# Patient Record
Sex: Female | Born: 1955 | Race: Black or African American | Hispanic: No | Marital: Married | Smoking: Former smoker
Health system: Southern US, Community
[De-identification: ages and names within clinical notes are randomized; demographics above are authoritative.]

## PROBLEM LIST (undated history)

## (undated) DIAGNOSIS — G4733 Obstructive sleep apnea (adult) (pediatric): Secondary | ICD-10-CM

## (undated) DIAGNOSIS — M545 Low back pain, unspecified: Secondary | ICD-10-CM

## (undated) DIAGNOSIS — E119 Type 2 diabetes mellitus without complications: Secondary | ICD-10-CM

## (undated) DIAGNOSIS — J869 Pyothorax without fistula: Secondary | ICD-10-CM

## (undated) DIAGNOSIS — H8110 Benign paroxysmal vertigo, unspecified ear: Secondary | ICD-10-CM

## (undated) DIAGNOSIS — Z8709 Personal history of other diseases of the respiratory system: Secondary | ICD-10-CM

## (undated) DIAGNOSIS — N6009 Solitary cyst of unspecified breast: Secondary | ICD-10-CM

## (undated) DIAGNOSIS — Z8701 Personal history of pneumonia (recurrent): Secondary | ICD-10-CM

## (undated) DIAGNOSIS — M170 Bilateral primary osteoarthritis of knee: Secondary | ICD-10-CM

## (undated) DIAGNOSIS — J189 Pneumonia, unspecified organism: Secondary | ICD-10-CM

## (undated) DIAGNOSIS — N200 Calculus of kidney: Secondary | ICD-10-CM

## (undated) DIAGNOSIS — M19011 Primary osteoarthritis, right shoulder: Secondary | ICD-10-CM

## (undated) DIAGNOSIS — J4530 Mild persistent asthma, uncomplicated: Secondary | ICD-10-CM

## (undated) HISTORY — DX: Pyothorax without fistula: J86.9

## (undated) HISTORY — DX: Mild persistent asthma, uncomplicated: J45.30

## (undated) HISTORY — DX: Personal history of pneumonia (recurrent): Z87.01

## (undated) HISTORY — DX: Low back pain, unspecified: M54.50

## (undated) HISTORY — PX: TONSILLECTOMY AND ADENOIDECTOMY: SHX28

## (undated) HISTORY — DX: Morbid (severe) obesity due to excess calories: E66.01

## (undated) HISTORY — DX: Obstructive sleep apnea (adult) (pediatric): G47.33

## (undated) HISTORY — DX: Pneumonia, unspecified organism: J18.9

## (undated) HISTORY — PX: TRANSTHORACIC ECHOCARDIOGRAM: SHX275

## (undated) HISTORY — DX: Solitary cyst of unspecified breast: N60.09

## (undated) HISTORY — DX: Benign paroxysmal vertigo, unspecified ear: H81.10

## (undated) HISTORY — DX: Personal history of other diseases of the respiratory system: Z87.09

## (undated) HISTORY — DX: Low back pain: M54.5

## (undated) HISTORY — DX: Primary osteoarthritis, right shoulder: M19.011

## (undated) HISTORY — DX: Bilateral primary osteoarthritis of knee: M17.0

## (undated) HISTORY — DX: Calculus of kidney: N20.0

---

## 1976-12-26 HISTORY — PX: APPENDECTOMY: SHX54

## 1984-12-26 HISTORY — PX: CHOLECYSTECTOMY: SHX55

## 1991-12-27 HISTORY — PX: LUMBAR DISC SURGERY: SHX700

## 1992-12-26 HISTORY — PX: TOTAL ABDOMINAL HYSTERECTOMY: SHX209

## 2006-12-26 HISTORY — PX: COLONOSCOPY: SHX174

## 2008-12-26 HISTORY — PX: CARDIOVASCULAR STRESS TEST: SHX262

## 2008-12-26 HISTORY — PX: CARDIAC CATHETERIZATION: SHX172

## 2009-12-26 HISTORY — PX: COLONOSCOPY: SHX174

## 2012-03-28 ENCOUNTER — Encounter: Payer: Self-pay | Admitting: Family Medicine

## 2012-03-28 ENCOUNTER — Ambulatory Visit (HOSPITAL_BASED_OUTPATIENT_CLINIC_OR_DEPARTMENT_OTHER)
Admission: RE | Admit: 2012-03-28 | Discharge: 2012-03-28 | Disposition: A | Discharge status: Home / Self Care | Payer: 59 | Source: Ambulatory Visit | Attending: Family Medicine | Admitting: Family Medicine

## 2012-03-28 ENCOUNTER — Ambulatory Visit (INDEPENDENT_AMBULATORY_CARE_PROVIDER_SITE_OTHER): Payer: 59 | Admitting: Family Medicine

## 2012-03-28 ENCOUNTER — Encounter: Payer: Self-pay | Admitting: *Deleted

## 2012-03-28 VITALS — BP 127/78 | HR 86 | Temp 99.8°F | Wt 355.0 lb

## 2012-03-28 DIAGNOSIS — R05 Cough: Secondary | ICD-10-CM

## 2012-03-28 DIAGNOSIS — R918 Other nonspecific abnormal finding of lung field: Secondary | ICD-10-CM

## 2012-03-28 DIAGNOSIS — Z87891 Personal history of nicotine dependence: Secondary | ICD-10-CM

## 2012-03-28 DIAGNOSIS — J45909 Unspecified asthma, uncomplicated: Secondary | ICD-10-CM

## 2012-03-28 DIAGNOSIS — R509 Fever, unspecified: Secondary | ICD-10-CM | POA: Insufficient documentation

## 2012-03-28 DIAGNOSIS — R059 Cough, unspecified: Secondary | ICD-10-CM | POA: Insufficient documentation

## 2012-03-28 DIAGNOSIS — R062 Wheezing: Secondary | ICD-10-CM

## 2012-03-28 MED ORDER — ALBUTEROL SULFATE HFA 108 (90 BASE) MCG/ACT IN AERS: 2.0000 | INHALATION_SPRAY | RESPIRATORY_TRACT | Status: DC | PRN

## 2012-03-28 MED ORDER — PREDNISONE 20 MG PO TABS: ORAL_TABLET | ORAL | Status: DC

## 2012-03-28 MED ORDER — ALBUTEROL SULFATE (2.5 MG/3ML) 0.083% IN NEBU: 2.5000 mg | INHALATION_SOLUTION | RESPIRATORY_TRACT | Status: DC

## 2012-03-28 MED ORDER — ALBUTEROL SULFATE (2.5 MG/3ML) 0.083% IN NEBU
2.5000 mg | INHALATION_SOLUTION | RESPIRATORY_TRACT | Status: AC
  Administered 2012-03-28 (×2): 2.5 mg via RESPIRATORY_TRACT

## 2012-03-28 NOTE — Progress Notes (Signed)
Office Note 03/28/2012  CC:  Chief Complaint  Patient presents with  . Establish Care    Will be a pt of [name], URI today; previously seen by [facility] and given levaquin and hycodan    HPI:  Taylor Leon is a 56 y.o. Black female who is here to get established with [facility] and is sick.  She will actually be a patient of [name] at our [facility] office but she is currently on vacation. Patient's most recent primary MD: Kirt Boys at [facility]. Old records not reviewed prior to or during today's visit.  She presents today with a two week history of cough, minimal nasal congestion or runny nose, onset was mild/insidious.  Some improvement midway through illness then got much worse: SOB, wheezing, tight in chest, fevers to 102. Cough productive of green sputum, occasionally blood tinged in the morning.   Saw MD last week and was rx'd levaquin, hycodan, and advair diskus.  She finished the levaquin but did not get the diskus due to high cost (80+$).   She is here b/c her breathing/chest sx's continue to get worse--can hardly talk due to frequent coughing and feeling SOB.  ROS: no HA, no rash, no ST.  Nasal allergies have not been bothering her.  Past Medical History  Diagnosis Date  . H/O allergic rhinitis   . Obesity   . History of pneumonia 2009 and 2010  . Osteoarthritis of both knees     No improvement with steroid injections; did synvisc x 3 yrs; bilat replacement was recommended but she declined.    Past Surgical History  Procedure Date  . Cholecystectomy 1986  . Appendectomy 1978  . Tonsillectomy and adenoidectomy 1967ish  . Lumbar disc surgery 1993    L5/S1  . Cesarean section     x 3  . Total abdominal hysterectomy 1994    Ovaries are still in.  This was done for DUB/fibroids.    Family History  Problem Relation Age of Onset  . Arthritis Mother   . Heart disease Mother 81  . Hypertension Mother   . Arthritis Paternal Grandmother       History   Social History  . Marital Status: Married    Spouse Name: N/A    Number of Children: N/A  . Years of Education: N/A   Occupational History  . Not on file.   Social History Main Topics  . Smoking status: Former Smoker    Types: Cigarettes    Quit date: 12/28/2009  . Smokeless tobacco: Never Used  . Alcohol Use: Yes     social  . Drug Use: No  . Sexually Active: Not on file   Other Topics Concern  . Not on file   Social History Narrative   Married, 4 grown children.Relocated to [place], [place] from [place] about 2012.Works as Tour manager.Former smoker: 15 pack-yr hx, quit 2011.Exercise: water aerobics 3-7 days per week.    MEDS: Hycodan susp, 1 tsp q6h prn, vitamin D, echinacea, probiotic, fish oil  Allergies  Allergen Reactions  . Penicillins Nausea Only and Rash    ROS Review of Systems  Constitutional: Positive for fever and fatigue. Negative for chills and appetite change.  HENT: Negative for ear pain, neck stiffness and dental problem.        See HPI  Eyes: Negative for discharge, redness and visual disturbance.  Respiratory:       See HPI  Cardiovascular: Negative for chest pain,  palpitations and leg swelling.  Gastrointestinal: Negative for nausea, vomiting, abdominal pain, diarrhea and blood in stool.  Genitourinary: Positive for vaginal pain. Negative for dysuria, urgency, frequency, hematuria, flank pain and difficulty urinating.  Musculoskeletal: Negative for myalgias, back pain, joint swelling and arthralgias.  Skin: Negative for pallor and rash.  Neurological: Negative for dizziness, speech difficulty, weakness and headaches.  Hematological: Negative for adenopathy. Does not bruise/bleed easily.  Psychiatric/Behavioral: Negative for confusion and sleep disturbance. The patient is not nervous/anxious.     PE; Blood pressure 127/78, pulse 86, temperature 99.8 F (37.7 C), temperature source Temporal, weight 355  lb (161.027 kg), SpO2 92.00%. Gen: Alert, tired appearing and in mild distress initially.  Patient is oriented to person, place, time, and situation.   ENT: Ears: EACs clear, normal epithelium.  TMs with good light reflex and landmarks bilaterally.  Eyes: no injection, icteris, swelling, or exudate.  EOMI, PERRLA. Nose: no drainage or turbinate edema/swelling.  No injection or focal lesion.  Mouth: lips without lesion/swelling.  Oral mucosa pink and moist.  Dentition intact and without obvious caries or gingival swelling.  Oropharynx without erythema, exudate, or swelling.  Neck - No masses or thyromegaly or limitation in range of motion CV: RRR, no m/r/g.   LUNGS: Initial exam showed globally decreased BS, mild prolongation of exp phase, excessive post exhalation coughing.  After albut #1 she had improved aeration and a hint of coarse end exp wheezing diffusely, with decreased BS and subtle soft insp crackles in both bases.  Resps were much slower and less labored.  Coughing lessened.  After neb #2 she had similar lung sounds, nonlabored breathing.  No tachypnea. ABD: soft, NT EXT: no clubbing, cyanosis, or edema.   Pertinent labs:  none  ASSESSMENT AND PLAN:   Asthmatic bronchitis Improved significantly with back to back albuterol 2.5mg  nebs here in office today. Plan to check CXR due to question of subtle bibasilar decreased breath sounds and soft inspiratory crackles. Start prednisone taper: 60mg  qd x 5d, then 40mg  qd x5d, then 20mg  qd x 5d. Ventolin HFA 2 puffs q4h prn.  Therapeutic expectations and side effect profile of medication discussed today.  Patient's questions answered. May continue prn hycodan that was previously prescribed. F/u in office in 2d.     Return in about 2 days (around 03/30/2012) for f/u asthmatic bronchitis.

## 2012-03-28 NOTE — Assessment & Plan Note (Signed)
Improved significantly with back to back albuterol 2.5mg  nebs here in office today. Plan to check CXR due to question of subtle bibasilar decreased breath sounds and soft inspiratory crackles. Start prednisone taper: 60mg  qd x 5d, then 40mg  qd x5d, then 20mg  qd x 5d. Ventolin HFA 2 puffs q4h prn.  Therapeutic expectations and side effect profile of medication discussed today.  Patient's questions answered. May continue prn hycodan that was previously prescribed. F/u in office in 2d.

## 2012-03-30 ENCOUNTER — Encounter: Payer: Self-pay | Admitting: Family Medicine

## 2012-03-30 ENCOUNTER — Ambulatory Visit (INDEPENDENT_AMBULATORY_CARE_PROVIDER_SITE_OTHER): Payer: 59 | Admitting: Family Medicine

## 2012-03-30 VITALS — BP 125/76 | HR 66 | Temp 98.2°F | Wt 355.0 lb

## 2012-03-30 DIAGNOSIS — J45909 Unspecified asthma, uncomplicated: Secondary | ICD-10-CM

## 2012-03-30 MED ORDER — HYDROCODONE-HOMATROPINE 5-1.5 MG/5ML PO SYRP: ORAL_SOLUTION | ORAL | Status: DC

## 2012-03-30 NOTE — Progress Notes (Signed)
OFFICE NOTE  04/01/2012  CC:  Chief Complaint  Patient presents with  . Bronchitis    2 day follow up     HPI: Patient is a 56 y.o. African-American female who is here for 2 d f/u acute asthmatic bronchitis.  Her CXR showed mild cardiomegaly and bronchitic changes but was otherwise normal. Definitely feels better, still coughing but breathing much improved.  No fever since yesterday morning, sputum is thinner, less of it.  Taking ventolin.  Prednisone made her have insomnia and some mild cognitive oddities/racing thoughts--she is on day 3 of a 15 day taper of 60 qd x 5d, 40qd x 5d, then 20 qd x 5d. She is apprehensive about this and the potential water retention/wt gain risk with prednisone, but admits she is willing to go through with the med if it means getting her lungs better.   Pertinent PMH:  Past Medical History  Diagnosis Date  . H/O allergic rhinitis   . Obesity   . History of pneumonia 2009 and 2010  . Osteoarthritis of both knees     No improvement with steroid injections; did synvisc x 3 yrs; bilat replacement was recommended but she declined.    MEDS:  Outpatient Prescriptions Prior to Visit  Medication Sig Dispense Refill  . albuterol (VENTOLIN HFA) 108 (90 BASE) MCG/ACT inhaler Inhale 2 puffs into the lungs every 4 (four) hours as needed for wheezing.  1 Inhaler  0  . cholecalciferol (VITAMIN D) 1000 UNITS tablet Take 1,000 Units by mouth daily.      Marland Kitchen ECHINACEA PO Take by mouth.      . Omega-3 Fatty Acids (FISH OIL PO) Take 1 capsule by mouth 3 (three) times a week.      . predniSONE (DELTASONE) 20 MG tablet 3 tabs po qd x 5d, then 2 tabs po qd x 5d, then 1 tab po qd x 5d  30 tablet  0  . Probiotic Product (PROBIOTIC FORMULA PO) Take by mouth.      Marland Kitchen HYDROcodone-homatropine (HYCODAN) 5-1.5 MG/5ML syrup Take 5 mLs by mouth every 4 (four) hours as needed.      Phentermine qd via bariatric clinic, HCG and vit B12 injections via bariatric clinic  PE: Blood pressure  125/76, pulse 66, temperature 98.2 F (36.8 C), temperature source Temporal, weight 355 lb (161.027 kg), SpO2 92.00%. Gen: Alert, well appearing.  Patient is oriented to person, place, time, and situation. ENT: Ears: EACs clear, normal epithelium.  TMs with good light reflex and landmarks bilaterally.  Eyes: no injection, icteris, swelling, or exudate.  EOMI, PERRLA. Nose: no drainage or turbinate edema/swelling.  No injection or focal lesion.  Mouth: lips without lesion/swelling.  Oral mucosa pink and moist.  Dentition intact and without obvious caries or gingival swelling.  Oropharynx without erythema, exudate, or swelling.  Neck - No masses or thyromegaly or limitation in range of motion CV: RRR, no m/r/g LUNGS: CTA bilat, mild diminished BS in expiration and mildly prolonged exp phase.  Nonlabored resps. EXT: no clubbing, cyanosis, or edema.    IMPRESSION AND PLAN:  Asthmatic bronchitis Improved appropriately over the last 48h. I would like to see her one more time for f/u of this, however, in 1 wk. Continue current meds, RF of hycodan (#173ml) given today. She'll call if she can't stand the way the prednisone is making her feel anymore and we'll see what we can do about adjusting dose/taper schedule.        FOLLOW UP: 1  wk

## 2012-04-01 ENCOUNTER — Encounter: Payer: Self-pay | Admitting: Family Medicine

## 2012-04-01 NOTE — Assessment & Plan Note (Addendum)
Improved appropriately over the last 48h. I would like to see her one more time for f/u of this, however, in 1 wk. Continue current meds, RF of hycodan (#113ml) given today. She'll call if she can't stand the way the prednisone is making her feel anymore and we'll see what we can do about adjusting dose/taper schedule.

## 2012-04-05 ENCOUNTER — Telehealth: Payer: Self-pay | Admitting: *Deleted

## 2012-04-05 NOTE — Telephone Encounter (Signed)
Pt has only taken on tablet today.  She states she feels bloated/puffy and loopy.  She describes loopy as her head is not together.  She states she is feeling better. Please advise.

## 2012-04-05 NOTE — Telephone Encounter (Signed)
VM left by pt wanting to know if she can wean to only one tablet of prednisone today.  She has taken 3 tab x 5 days, 2 tabs x3 days and would like to start one tablet today.  PC to pt for more information.  Message left with husband to have patient return my call.

## 2012-04-05 NOTE — Telephone Encounter (Signed)
Yes, may now go to one tab once daily.

## 2012-04-06 ENCOUNTER — Encounter: Payer: Self-pay | Admitting: Family Medicine

## 2012-04-06 ENCOUNTER — Ambulatory Visit (INDEPENDENT_AMBULATORY_CARE_PROVIDER_SITE_OTHER): Payer: 59 | Admitting: Family Medicine

## 2012-04-06 DIAGNOSIS — J218 Acute bronchiolitis due to other specified organisms: Secondary | ICD-10-CM

## 2012-04-06 DIAGNOSIS — J45909 Unspecified asthma, uncomplicated: Secondary | ICD-10-CM

## 2012-04-06 NOTE — Patient Instructions (Signed)
Take 1 20mg  prednisone tab once daily Sat and Sun, then take 1/2 of 30m tab once daily on M, T, W.  Then stop prednisone. Use Ventolin 2 puffs 15 min prior to vigorous activity (for the next 1-2 wks).

## 2012-04-06 NOTE — Telephone Encounter (Signed)
Pt notified on 4/11 OK to decrease to just one tablet daily.  She will come for office visit today for follow up and further instructions on weaning.

## 2012-04-06 NOTE — Progress Notes (Signed)
OFFICE NOTE  04/06/2012  CC:  Chief Complaint  Patient presents with  . Follow-up    Asthmatic bronchitis     HPI: Patient is a 56 y.o. African-American female who is here for 5 day f/u acute bronchitis.  Feeling much improved.  Rare use of albuterol inhaler or hycodan last couple of days.  Prednisone makes her irritable, some sleep dysfunction, plus she's worried about fluid retention/wt gain on this med.    Pertinent PMH:  Past Medical History  Diagnosis Date  . H/O allergic rhinitis   . Obesity     Currently goes to Bariatric clinic on [address] and gets weekly HCG injections, monthly vit B12 injections, and takes phentermine daily.  Marland Kitchen History of pneumonia 2009 and 2010  . Osteoarthritis of both knees     No improvement with steroid injections; did synvisc x 3 yrs; bilat replacement was recommended but she declined.    MEDS:  Outpatient Prescriptions Prior to Visit  Medication Sig Dispense Refill  . albuterol (VENTOLIN HFA) 108 (90 BASE) MCG/ACT inhaler Inhale 2 puffs into the lungs every 4 (four) hours as needed for wheezing.  1 Inhaler  0  . cholecalciferol (VITAMIN D) 1000 UNITS tablet Take 1,000 Units by mouth daily.      Marland Kitchen ECHINACEA PO Take by mouth.      Marland Kitchen HYDROcodone-homatropine (HYCODAN) 5-1.5 MG/5ML syrup 1-2 tsp po q6h prn cough  120 mL  0  . Omega-3 Fatty Acids (FISH OIL PO) Take 1 capsule by mouth 3 (three) times a week.      . predniSONE (DELTASONE) 20 MG tablet 3 tabs po qd x 5d, then 2 tabs po qd x 5d, then 1 tab po qd x 5d  30 tablet  0  . Probiotic Product (PROBIOTIC FORMULA PO) Take by mouth.        PE: Blood pressure 118/71, pulse 59, temperature 97.8 F (36.6 C), temperature source Temporal, weight 349 lb (158.305 kg), SpO2 95.00%. Gen: Alert, well appearing.  Patient is oriented to person, place, time, and situation. CV: RRR, no m/r/g.   LUNGS: CTA bilat, nonlabored resps, good aeration in all lung fields.  Some mild post-exhalation coughing  intermittently on forced expiration.   IMPRESSION AND PLAN:  Asthmatic bronchitis Continues to improve. Due to mild sensitivity/intolerance to psych effects of prednisone will decrease her taper over the next 5d to 20mg  qd x 2d and then 10mg  qd x 3d, then stop. May restart exercise, swim aerobics--consider pretreatment with albuterol HFA.      FOLLOW UP: prn

## 2012-04-09 NOTE — Assessment & Plan Note (Signed)
Continues to improve. Due to mild sensitivity/intolerance to psych effects of prednisone will decrease her taper over the next 5d to 20mg  qd x 2d and then 10mg  qd x 3d, then stop. May restart exercise, swim aerobics--consider pretreatment with albuterol HFA.

## 2012-04-11 ENCOUNTER — Ambulatory Visit (INDEPENDENT_AMBULATORY_CARE_PROVIDER_SITE_OTHER): Payer: 59 | Admitting: Family Medicine

## 2012-04-11 ENCOUNTER — Encounter: Payer: Self-pay | Admitting: Family Medicine

## 2012-04-11 VITALS — BP 125/81 | HR 68 | Temp 98.0°F | Ht 68.0 in | Wt 348.6 lb

## 2012-04-11 DIAGNOSIS — J302 Other seasonal allergic rhinitis: Secondary | ICD-10-CM

## 2012-04-11 DIAGNOSIS — J45909 Unspecified asthma, uncomplicated: Secondary | ICD-10-CM

## 2012-04-11 DIAGNOSIS — J309 Allergic rhinitis, unspecified: Secondary | ICD-10-CM

## 2012-04-11 MED ORDER — PREDNISONE 20 MG PO TABS: ORAL_TABLET | ORAL | Status: DC

## 2012-04-11 MED ORDER — METHYLPREDNISOLONE ACETATE 40 MG/ML IJ SUSP
40.0000 mg | Freq: Once | INTRAMUSCULAR | Status: AC
  Administered 2012-04-12: 40 mg via INTRAMUSCULAR

## 2012-04-11 MED ORDER — FLUTICASONE PROPIONATE 50 MCG/ACT NA SUSP: 2.0000 | Freq: Every day | NASAL | Status: DC

## 2012-04-11 MED ORDER — ALBUTEROL SULFATE (5 MG/ML) 0.5% IN NEBU: 2.5000 mg | INHALATION_SOLUTION | Freq: Once | RESPIRATORY_TRACT | Status: DC

## 2012-04-11 MED ORDER — AZITHROMYCIN 250 MG PO TABS: ORAL_TABLET | ORAL | Status: DC

## 2012-04-11 MED ORDER — IPRATROPIUM BROMIDE 0.02 % IN SOLN: 0.5000 mg | Freq: Once | RESPIRATORY_TRACT | Status: DC

## 2012-04-11 MED ORDER — ALBUTEROL SULFATE (2.5 MG/3ML) 0.083% IN NEBU
2.5000 mg | INHALATION_SOLUTION | RESPIRATORY_TRACT | Status: AC
  Administered 2012-04-11: 2.5 mg via RESPIRATORY_TRACT

## 2012-04-11 MED ORDER — FEXOFENADINE HCL 180 MG PO TABS: 180.0000 mg | ORAL_TABLET | Freq: Every day | ORAL | Status: DC

## 2012-04-11 NOTE — Assessment & Plan Note (Addendum)
She did not respond well to our recent "quick ween" from the prednisone. I think we need to control her allergic rhinitis better (start allegra 180mg  qd and flonase 2 sprays each nostril qd), plus will add another round of antibiotic (Z-pack).   Additionally, I sent her home with a nebulizer machine and samples of albuterol 0.083% neb solution to use instead of her Ventolin HFA for now. Gave depo-medrol 40mg  IM in office today, and will restart her prednisone 40mg  qd dosing tomorrow--do this 5d, then step down to 20mg  qd x 5d.  At that point, I want to see her back in the office. Gave one 2.5mg  albuterol neb in office today, followed that with one duoneb.  Her aeration had improved and post-exhalation coughing lessened. We decided to have an allergist see her for further evaluation and I placed this order today. We discussed sx's/situations to go to the ED for.

## 2012-04-11 NOTE — Progress Notes (Signed)
OFFICE NOTE  04/11/2012  CC:  Chief Complaint  Patient presents with  . Cough    X 3 weeks W/ phlegm (greenish)  . Shortness of Breath    X 3 weeks     HPI: Patient is a 56 y.o. African-American female who is here for the fourth visit in the last 2 wks for waxing/waning cough and chest tightness with intermittent SOB.  No fevers/chills/rigors.  Feels PND and sensation of something in the back of her throat frequently.  Denies heartburn or relation of her sx's to meals.        Cough comes in fits, usually late mornings after talking on the phone a lot, then after a coughing fit the feeling of chest tightness persists and she has to use ventolin q4h, esp if she gets up and tries to walk around.  No exertional chest pain, no nausea, no palpitations.  She has some sweating with coughing fits but this abates in a few minutes.   Last visit about 5d ago we stepped down her prednisone a little early to a 20 mg daily dose for 3d, then 10mg  daily dose for the last 3d.  She reports feeling fine in the morning, goes to do water aerobics (pretreats with ventolin) and feels good. Recently had some coughing very productive of green sputum.     Pertinent PMH:  Past Medical History  Diagnosis Date  . H/O allergic rhinitis   . Obesity     Currently goes to Bariatric clinic on [address] and gets weekly HCG injections, monthly vit B12 injections, and takes phentermine daily.  Marland Kitchen History of pneumonia 2009 and 2010  . Osteoarthritis of both knees     No improvement with steroid injections; did synvisc x 3 yrs; bilat replacement was recommended but she declined.  No past history of asthmatic bronchitis.  MEDS:  MEDS: Ventolin HFA 1-2 puffs q4h prn, recent prednisone taper (current dose 10mg  qd), vit D, echinacea  PE: Blood pressure 125/81, pulse 68, temperature 98 F (36.7 C), temperature source Temporal, height 5\' 8"  (1.727 m), weight 348 lb 9.6 oz (158.124 kg), SpO2 92.00%. Gen: Alert, well  appearing.  Patient is oriented to person, place, time, and situation. ENT: Ears: EACs clear, normal epithelium.  TMs with good light reflex and landmarks bilaterally.  Eyes: no injection, icteris, swelling, or exudate.  EOMI, PERRLA. Nose: no drainage or turbinate edema/swelling.  No injection or focal lesion.  Mouth: lips without lesion/swelling.  Oral mucosa pink and moist.  Dentition intact and without obvious caries or gingival swelling.  Oropharynx without erythema, exudate, or swelling.  Neck - No masses or thyromegaly or limitation in range of motion CV: RRR, no m/r/g LUNGS: CTA bilat but poor aeration and when asked to do deep breaths there are some coarse exp rhonchi with excessive post-exhalation coughing.   EXT: no clubbing, cyanosis, or edema.   LAB: none  IMPRESSION AND PLAN:  Asthmatic bronchitis She did not respond well to our recent "quick ween" from the prednisone. I think we need to control her allergic rhinitis better (start allegra 180mg  qd and flonase 2 sprays each nostril qd), plus will add another round of antibiotic (Z-pack).   Additionally, I sent her home with a nebulizer machine and samples of albuterol 0.083% neb solution to use instead of her Ventolin HFA for now. Gave depo-medrol 40mg  IM in office today, and will restart her prednisone 40mg  qd dosing tomorrow--do this 5d, then step down to 20mg  qd  x 5d.  At that point, I want to see her back in the office. Gave one 2.5mg  albuterol neb in office today, followed that with one duoneb.  Her aeration had improved and post-exhalation coughing lessened. We decided to have an allergist see her for further evaluation and I placed this order today. We discussed sx's/situations to go to the ED for.       FOLLOW UP: 10-12 days

## 2012-04-26 ENCOUNTER — Ambulatory Visit: Payer: 59 | Admitting: Family Medicine

## 2012-05-16 ENCOUNTER — Ambulatory Visit
Admission: RE | Admit: 2012-05-16 | Discharge: 2012-05-16 | Disposition: A | Discharge status: Home / Self Care | Payer: 59 | Source: Ambulatory Visit | Attending: Allergy and Immunology | Admitting: Allergy and Immunology

## 2012-05-16 ENCOUNTER — Other Ambulatory Visit: Payer: Self-pay | Admitting: Allergy and Immunology

## 2012-05-16 DIAGNOSIS — R0602 Shortness of breath: Secondary | ICD-10-CM

## 2012-05-16 DIAGNOSIS — R05 Cough: Secondary | ICD-10-CM

## 2012-07-26 ENCOUNTER — Encounter: Payer: Self-pay | Admitting: Family Medicine

## 2012-09-10 ENCOUNTER — Encounter: Payer: Self-pay | Admitting: Family Medicine

## 2012-09-10 ENCOUNTER — Ambulatory Visit (INDEPENDENT_AMBULATORY_CARE_PROVIDER_SITE_OTHER): Payer: 59 | Admitting: Family Medicine

## 2012-09-10 VITALS — BP 112/69 | HR 73 | Ht 68.0 in | Wt 358.0 lb

## 2012-09-10 DIAGNOSIS — M1710 Unilateral primary osteoarthritis, unspecified knee: Secondary | ICD-10-CM | POA: Insufficient documentation

## 2012-09-10 MED ORDER — HYDROCODONE-ACETAMINOPHEN 5-325 MG PO TABS: ORAL_TABLET | ORAL | Status: DC

## 2012-09-10 NOTE — Progress Notes (Signed)
OFFICE NOTE  09/10/2012  CC:  Chief Complaint  Patient presents with  . Knee Pain    bilateral x 2 weeks; aleve not helping, worse when laying down, can't sleep     HPI: Patient is a 56 y.o. African-American female who is here for knee pains. Bilat knee pain/throbbing, R>L--occurring 2+ wks now, no preceding strain/injury or excessive use. Sometimes feel warm but no redness noted.  Worse lying down.  Still occurs with sitting and with wt bearing.  NO buckling or "catching" when ambulating.  +Feel swollen b/c "hard to flex all the way".   Tried alleve a couple of times over the last couple of weeks: no help.  Has significant pos hx of bilat knee DJD.  Saw Dr. Willa Rough for her allergic rhinitis, says she was dx'd with asthma and put on a regimen of meds that she then stopped abruptly the las week of July and is simply watching her sx's closely.  She has an inhaler for rescue.   Pertinent PMH:  Past Medical History  Diagnosis Date  . H/O allergic rhinitis   . Obesity     Currently goes to Bariatric clinic on [address] and gets weekly HCG injections, monthly vit B12 injections, and takes phentermine daily.  Marland Kitchen History of pneumonia 2009 and 2010  . Osteoarthritis of both knees     No improvement with steroid injections; did synvisc x 3 yrs; bilat replacement was recommended but she declined.  . Cyst, breast 01/2012; 06/2012    Complicated cysts in upper outer left breast--no evidence of malignancy--f/u mammo in 88mo (annual screen) + left breast u/s at that time    MEDS:  Outpatient Prescriptions Prior to Visit  Medication Sig Dispense Refill  . albuterol (VENTOLIN HFA) 108 (90 BASE) MCG/ACT inhaler Inhale 2 puffs into the lungs every 4 (four) hours as needed for wheezing.  1 Inhaler  0  . cholecalciferol (VITAMIN D) 1000 UNITS tablet Take 1,000 Units by mouth daily.      Marland Kitchen ECHINACEA PO Take by mouth.      . fexofenadine (ALLEGRA) 180 MG tablet Take 1 tablet (180 mg total) by mouth  daily.  30 tablet  6  . fluticasone (FLONASE) 50 MCG/ACT nasal spray Place 2 sprays into the nose daily.  16 g  6  . Omega-3 Fatty Acids (FISH OIL PO) Take 1 capsule by mouth 3 (three) times a week.      . Probiotic Product (PROBIOTIC FORMULA PO) Take by mouth.      Marland Kitchen azithromycin (ZITHROMAX) 250 MG tablet 2 tabs po qd x 1d, then 1 tab po qd x 4d  6 each  0  . HYDROcodone-homatropine (HYCODAN) 5-1.5 MG/5ML syrup 1-2 tsp po q6h prn cough  120 mL  0  . predniSONE (DELTASONE) 20 MG tablet 2 tabs po qd x 5d, then 1 tab po qd x 5d  15 tablet  0    PE: Blood pressure 112/69, pulse 73, height 5\' 8"  (1.727 m), weight 358 lb (162.388 kg). Gen: Alert, well appearing, morbidly obese AA female.  Patient is oriented to person, place, time, and situation. AFFECT: pleasant, lucid thought and speech. Knees: +warmth noted anteriorly, without erythema or obvious effusion.  Patellar grind neg bilat.  Medial joint line and medial knee peripatellar soft tissue regions TTP--on both knees.  Flexion uncomfortable and limited to 60 degrees or so on each side.  She can extend fully with each knee.    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   Transderm topical antiinflammitory rx'd (diclofenac 3%, baclofen 2%, orphenadrine 5%, and bupivicaine 2%), apply 1-2 g to each knee qid prn. Vicodin 5/500, 1-2 tabs q6h prn pain, #30, no RF. Refer to local orthopedics (her husband goes to [facility] and she would like to go there).     FOLLOW UP: prn

## 2012-09-10 NOTE — Assessment & Plan Note (Signed)
Bilaterally.   Transderm topical antiinflammitory rx'd (diclofenac 3%, baclofen 2%, orphenadrine 5%, and bupivicaine 2%), apply 1-2 g to each knee qid prn. Vicodin 5/500, 1-2 tabs q6h prn pain, #30, no RF. Refer to local orthopedics (her husband goes to [facility] and she would like to go there).

## 2012-11-23 ENCOUNTER — Encounter: Payer: Self-pay | Admitting: Family Medicine

## 2012-12-26 DIAGNOSIS — J4530 Mild persistent asthma, uncomplicated: Secondary | ICD-10-CM

## 2012-12-26 HISTORY — DX: Mild persistent asthma, uncomplicated: J45.30

## 2013-01-16 ENCOUNTER — Encounter: Payer: Self-pay | Admitting: Family Medicine

## 2013-01-21 ENCOUNTER — Ambulatory Visit (INDEPENDENT_AMBULATORY_CARE_PROVIDER_SITE_OTHER): Payer: 59 | Admitting: Family Medicine

## 2013-01-21 ENCOUNTER — Encounter: Payer: Self-pay | Admitting: Family Medicine

## 2013-01-21 VITALS — BP 108/64 | HR 84 | Temp 98.8°F | Ht 68.0 in

## 2013-01-21 DIAGNOSIS — J329 Chronic sinusitis, unspecified: Secondary | ICD-10-CM

## 2013-01-21 NOTE — Patient Instructions (Signed)
Restart Allegra 180mg  once daily. Take Delsym (OTC; generic is fine) twice daily as needed for cough suppression. Use saline nasal spray 2-3 sprays each nostril twice per day to irrigate nasal passages.

## 2013-01-21 NOTE — Progress Notes (Signed)
OFFICE NOTE  01/21/2013  CC:  Chief Complaint  Patient presents with  . URI    nasal/chest congestion x 5 days; productive cough began last night; Tylenol Cold not helpful     HPI: Patient is a 57 y.o. African-American female who is here for respiratory complaints. Pt presents complaining of respiratory symptoms for 5  days.  Primary symptoms are: runny nose, mucous thicker in nose and now has cough.  Cough "so so" productive.  +ST, minimal HA.  Worst symptoms seems to be the head congestion.  Lately the symptoms seem to be worsening (cough). Pertinent negatives: No fevers, no wheezing, and no SOB.  No pain in face or teeth.  No significant HA.  ST mild at most.   Symptoms made worse by nothing.  Symptoms improved by nothing (Tylenol cold med has been tried once, otherwise no symptom meds have been tried). Smoker? Former (quit 3 yrs ago) Recent sick contact? yes Muscle or joint aches? no Flu shot this season at least 2 wks ago? no  Additional ROS: no n/v/d or abdominal pain.  No rash.  No neck stiffness.   +Mild fatigue.  +Mild appetite loss.   Pertinent PMH:  Past Medical History  Diagnosis Date  . H/O allergic rhinitis     Dr. Willa Rough  . Obesity     Currently goes to Bariatric clinic on [address] and gets weekly HCG injections, monthly vit B12 injections, and takes phentermine daily.  Marland Kitchen History of pneumonia 2009 and 2010  . Osteoarthritis of both knees     No improvement with steroid injections; did synvisc x 3 yrs; bilat replacement was recommended but she declined.  . Cyst, breast 01/2012; 06/2012    Complicated cysts in upper outer left breast--no evidence of malignancy--f/u mammo in 34mo (annual screen) + left breast u/s at that time  . Recurrent low back pain     MEDS:  Outpatient Prescriptions Prior to Visit  Medication Sig Dispense Refill  . cholecalciferol (VITAMIN D) 1000 UNITS tablet Take 1,000 Units by mouth daily.      Marland Kitchen ECHINACEA PO Take by mouth.      .  Probiotic Product (PROBIOTIC FORMULA PO) Take by mouth.      Marland Kitchen albuterol (VENTOLIN HFA) 108 (90 BASE) MCG/ACT inhaler Inhale 2 puffs into the lungs every 4 (four) hours as needed for wheezing.  1 Inhaler  0  . [DISCONTINUED] fexofenadine (ALLEGRA) 180 MG tablet Take 1 tablet (180 mg total) by mouth daily.  30 tablet  6  . [DISCONTINUED] fluticasone (FLONASE) 50 MCG/ACT nasal spray Place 2 sprays into the nose daily.  16 g  6  . [DISCONTINUED] HYDROcodone-acetaminophen (NORCO/VICODIN) 5-325 MG per tablet 1-2 tabs po q6h prn pain  30 tablet  0  . [DISCONTINUED] Omega-3 Fatty Acids (FISH OIL PO) Take 1 capsule by mouth 3 (three) times a week.       Last reviewed on 01/21/2013 11:14 AM by Jeoffrey Massed, MD  PE: Blood pressure 108/64, pulse 84, temperature 98.8 F (37.1 C), temperature source Temporal, height 5\' 8"  (1.727 m), SpO2 95.00%. VS: noted--normal. Gen: alert, NAD, NONTOXIC APPEARING. HEENT: eyes without injection, drainage, or swelling.  Ears: EACs clear, TMs with normal light reflex and landmarks.  Nose: Clear rhinorrhea, with some dried, crusty exudate adherent to mildly injected mucosa.  No purulent d/c.  No paranasal sinus TTP.  No facial swelling.  Throat and mouth without focal lesion.  No pharyngial swelling, erythema, or exudate.  Neck: supple, no LAD.   LUNGS: CTA bilat, nonlabored resps.   CV: RRR, no m/r/g. EXT: no c/c/e SKIN: no rash  LAB: none  IMPRESSION AND PLAN:  Viral URI, with PND cough. Reassured pt that no sign of RAD present today, no sign of bacterial infection or need for abx. Encouraged appropriate symptomatic care:  Restart Allegra 180mg  once daily. Take Delsym (OTC; generic is fine) twice daily as needed for cough suppression. Use saline nasal spray 2-3 sprays each nostril twice per day to irrigate nasal passages.   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 She has had imaging to support  this in former [place] but nothing since living in [place].  She'll also keep scheduled f/u with allergist, Dr. Willa Rough, in Feb of this year.  FOLLOW UP: prn

## 2013-02-27 ENCOUNTER — Encounter: Payer: Self-pay | Admitting: Family Medicine

## 2013-03-12 ENCOUNTER — Encounter: Payer: Self-pay | Admitting: Family Medicine

## 2013-05-03 ENCOUNTER — Encounter: Payer: Self-pay | Admitting: Family Medicine

## 2013-05-27 ENCOUNTER — Emergency Department (HOSPITAL_BASED_OUTPATIENT_CLINIC_OR_DEPARTMENT_OTHER)
Admission: EM | Admit: 2013-05-27 | Discharge: 2013-05-27 | Disposition: A | Discharge status: Home / Self Care | Payer: 59 | Attending: Emergency Medicine | Admitting: Emergency Medicine

## 2013-05-27 ENCOUNTER — Encounter (HOSPITAL_BASED_OUTPATIENT_CLINIC_OR_DEPARTMENT_OTHER): Payer: Self-pay | Admitting: Student

## 2013-05-27 DIAGNOSIS — Z87891 Personal history of nicotine dependence: Secondary | ICD-10-CM | POA: Insufficient documentation

## 2013-05-27 DIAGNOSIS — Z8739 Personal history of other diseases of the musculoskeletal system and connective tissue: Secondary | ICD-10-CM | POA: Insufficient documentation

## 2013-05-27 DIAGNOSIS — Z880 Allergy status to penicillin: Secondary | ICD-10-CM | POA: Insufficient documentation

## 2013-05-27 DIAGNOSIS — Z79899 Other long term (current) drug therapy: Secondary | ICD-10-CM | POA: Insufficient documentation

## 2013-05-27 DIAGNOSIS — Z87448 Personal history of other diseases of urinary system: Secondary | ICD-10-CM | POA: Insufficient documentation

## 2013-05-27 DIAGNOSIS — M62838 Other muscle spasm: Secondary | ICD-10-CM

## 2013-05-27 DIAGNOSIS — J45909 Unspecified asthma, uncomplicated: Secondary | ICD-10-CM | POA: Insufficient documentation

## 2013-05-27 DIAGNOSIS — E669 Obesity, unspecified: Secondary | ICD-10-CM | POA: Insufficient documentation

## 2013-05-27 DIAGNOSIS — Z8701 Personal history of pneumonia (recurrent): Secondary | ICD-10-CM | POA: Insufficient documentation

## 2013-05-27 MED ORDER — HYDROCODONE-ACETAMINOPHEN 5-325 MG PO TABS: 1.0000 | ORAL_TABLET | ORAL | Status: DC | PRN

## 2013-05-27 MED ORDER — KETOROLAC TROMETHAMINE 30 MG/ML IJ SOLN
60.0000 mg | Freq: Once | INTRAMUSCULAR | Status: AC
  Administered 2013-05-27: 60 mg via INTRAMUSCULAR
  Filled 2013-05-27: qty 1

## 2013-05-27 MED ORDER — DIAZEPAM 5 MG/ML IJ SOLN
5.0000 mg | Freq: Once | INTRAMUSCULAR | Status: AC
  Administered 2013-05-27: 5 mg via INTRAMUSCULAR
  Filled 2013-05-27: qty 2

## 2013-05-27 MED ORDER — DIAZEPAM 5 MG PO TABS: 5.0000 mg | ORAL_TABLET | Freq: Two times a day (BID) | ORAL | Status: DC

## 2013-05-27 NOTE — ED Notes (Signed)
Pt in with c/o neck pain s/p forceful coughing episode while dealing with asthma and allergies. Reports neck stiffness.

## 2013-05-27 NOTE — ED Provider Notes (Signed)
Medical screening examination/treatment/procedure(s) were performed by non-physician practitioner and as supervising physician I was immediately available for consultation/collaboration.   Issachar Broady, MD 05/27/13 2218 

## 2013-05-27 NOTE — ED Notes (Signed)
Pt requests to wait in room sitting up in the chair while her husband fills her prescriptions. Husband shown to pharmacy.

## 2013-05-27 NOTE — ED Provider Notes (Signed)
History     CSN: [id number]  Arrival date & time 05/27/13  1613   First MD Initiated Contact with Patient 05/27/13 1633      Chief Complaint  Patient presents with  . Neck Pain    (Consider location/radiation/quality/duration/timing/severity/associated sxs/prior treatment) HPI Comments: Pt states that she has had forceful coughing episode and then she had pain to the left side of her neck:pt states that she cant turn her head from side to side without pain:pt denies numbness or weakness  Patient is a 57 y.o. female presenting with neck pain. The history is provided by the patient. No language interpreter was used.  Neck Pain Pain location:  L side Quality:  Cramping Pain severity:  Moderate Pain is:  Same all the time Onset quality:  Sudden Timing:  Constant Progression:  Unchanged Chronicity:  New Relieved by:  Nothing Worsened by:  Nothing tried Ineffective treatments:  None tried Associated symptoms: no fever, no headaches, no numbness, no tingling and no weakness     Past Medical History  Diagnosis Date  . H/O allergic rhinitis     Dr. Willa Rough  . Obesity     Currently goes to Bariatric clinic on [address] and gets weekly HCG injections, monthly vit B12 injections, and takes phentermine daily.  Marland Kitchen History of pneumonia 2009 and 2010  . Osteoarthritis of both knees     No improvement with steroid injections; did synvisc x 3 yrs; bilat replacement was recommended but she declined.  . Cyst, breast 01/2012; 06/2012; 02/26/13    Complicated cysts in upper outer left breast--no evidence of malignancy--f/u mammo/us in 1 yr from 02/26/13  . Recurrent low back pain   . Intermittent asthma     Past Surgical History  Procedure Laterality Date  . Cholecystectomy  1986  . Appendectomy  1978  . Tonsillectomy and adenoidectomy  1967ish  . Lumbar disc surgery  1993    L5/S1  . Cesarean section      x 3  . Total abdominal hysterectomy  1994    Ovaries are still in.  This was  done for DUB/fibroids.    Family History  Problem Relation Age of Onset  . Arthritis Mother   . Heart disease Mother 87  . Hypertension Mother   . Arthritis Paternal Grandmother     History  Substance Use Topics  . Smoking status: Former Smoker    Types: Cigarettes    Quit date: 12/28/2009  . Smokeless tobacco: Never Used  . Alcohol Use: Yes     Comment: social    OB History   Grav Para Term Preterm Abortions TAB SAB Ect Mult Living                  Review of Systems  Constitutional: Negative for fever.  HENT: Positive for neck pain.   Respiratory: Negative.   Cardiovascular: Negative.   Neurological: Negative for tingling, weakness, numbness and headaches.    Allergies  Penicillins  Home Medications   Current Outpatient Rx  Name  Route  Sig  Dispense  Refill  . EXPIRED: albuterol (VENTOLIN HFA) 108 (90 BASE) MCG/ACT inhaler   Inhalation   Inhale 2 puffs into the lungs every 4 (four) hours as needed for wheezing.   1 Inhaler   0   . BIOTIN PO   Oral   Take 1 tablet by mouth daily.         . cholecalciferol (VITAMIN D) 1000 UNITS tablet  Oral   Take 1,000 Units by mouth daily.         . diazepam (VALIUM) 5 MG tablet   Oral   Take 1 tablet (5 mg total) by mouth 2 (two) times daily.   10 tablet   0   . ECHINACEA PO   Oral   Take by mouth.         Marland Kitchen HYDROcodone-acetaminophen (NORCO/VICODIN) 5-325 MG per tablet   Oral   Take 1 tablet by mouth every 4 (four) hours as needed for pain.   10 tablet   0   . Probiotic Product (PROBIOTIC FORMULA PO)   Oral   Take by mouth.           BP 130/80  Pulse 78  Temp(Src) 98.8 F (37.1 C) (Oral)  Wt 360 lb (163.295 kg)  BMI 54.75 kg/m2  SpO2 97%  Physical Exam  Nursing note and vitals reviewed. Constitutional: She is oriented to person, place, and time. She appears well-developed and well-nourished.  HENT:  Head: Atraumatic.  Eyes: EOM are normal.  Cardiovascular: Normal rate and regular  rhythm.   Pulmonary/Chest: Effort normal and breath sounds normal.  Musculoskeletal:  Pt has tenderness to the left cervical paraspinal area:grip strength equal  Neurological: She is alert and oriented to person, place, and time.  Skin: Skin is dry.  Psychiatric: She has a normal mood and affect.    ED Course  Procedures (including critical care time)  Labs Reviewed - No data to display No results found.   1. Muscle spasms of neck       MDM  Pt feeling slightly better:will send home with symptomatic treatment and follow up with dr. Pearletha Forge as needed:dont think imaging is needed at this time:pt is not having neurodeficits:like spasms       Teressa Lower, NP 05/27/13 1739

## 2013-08-06 ENCOUNTER — Encounter: Payer: Self-pay | Admitting: Family Medicine

## 2013-08-29 ENCOUNTER — Telehealth: Payer: Self-pay | Admitting: *Deleted

## 2013-08-29 ENCOUNTER — Ambulatory Visit (INDEPENDENT_AMBULATORY_CARE_PROVIDER_SITE_OTHER): Payer: 59 | Admitting: Family Medicine

## 2013-08-29 ENCOUNTER — Encounter: Payer: Self-pay | Admitting: Family Medicine

## 2013-08-29 VITALS — BP 141/80 | HR 71 | Temp 98.1°F | Resp 18 | Ht 68.0 in | Wt 385.0 lb

## 2013-08-29 DIAGNOSIS — Z23 Encounter for immunization: Secondary | ICD-10-CM

## 2013-08-29 DIAGNOSIS — J069 Acute upper respiratory infection, unspecified: Secondary | ICD-10-CM | POA: Insufficient documentation

## 2013-08-29 DIAGNOSIS — J309 Allergic rhinitis, unspecified: Secondary | ICD-10-CM | POA: Insufficient documentation

## 2013-08-29 MED ORDER — FLUTICASONE PROPIONATE 50 MCG/ACT NA SUSP: NASAL | Status: DC

## 2013-08-29 MED ORDER — ALBUTEROL SULFATE (2.5 MG/3ML) 0.083% IN NEBU: 2.5000 mg | INHALATION_SOLUTION | Freq: Four times a day (QID) | RESPIRATORY_TRACT | Status: DC | PRN

## 2013-08-29 MED ORDER — PREDNISONE 20 MG PO TABS: ORAL_TABLET | ORAL | Status: DC

## 2013-08-29 MED ORDER — CETIRIZINE HCL 10 MG PO TABS: 10.0000 mg | ORAL_TABLET | Freq: Every day | ORAL | Status: DC

## 2013-08-29 MED ORDER — LEVOFLOXACIN 500 MG PO TABS: 500.0000 mg | ORAL_TABLET | Freq: Every day | ORAL | Status: DC

## 2013-08-29 NOTE — Progress Notes (Signed)
OFFICE NOTE  08/29/2013  CC:  Chief Complaint  Patient presents with  . Nasal Congestion    x Friday  . Cough     HPI: Patient is a 57 y.o. African-American female who is here for respiratory complaints. Nasal congestion and cough x 6-7d.   Thick, yellow sputum coming from nose and comes up when she coughs. Takes albut 2 puffs each morning during this illness to try to prevent sx's-no rescue use. Subjective f/c some nights but no definite fever.  Some pain in forehead/frontal sinus areas. Took generic OTC allergy med.   Pertinent PMH:  Past Medical History  Diagnosis Date  . H/O allergic rhinitis     Dr. Willa Rough  . Obesity     Currently goes to Bariatric clinic on [address] and gets weekly HCG injections, monthly vit B12 injections, and takes phentermine daily.  Marland Kitchen History of pneumonia 2009 and 2010  . Osteoarthritis of both knees     No improvement with steroid injections; did synvisc x 3 yrs; bilat replacement was recommended but she declined.  . Cyst, breast 01/2012; 06/2012; 02/26/13    Complicated cysts in upper outer left breast--no evidence of malignancy--f/u mammo/us in 1 yr from 02/26/13  . Recurrent low back pain   . Mild persistent asthma, well controlled 2014    New adult onset asthma after respiratory infection (Dr. Willa Rough)   Past surgical, social, and family history reviewed and no changes noted since last office visit.  MEDS:  Outpatient Prescriptions Prior to Visit  Medication Sig Dispense Refill  . cholecalciferol (VITAMIN D) 1000 UNITS tablet Take 1,000 Units by mouth daily.      Marland Kitchen ECHINACEA PO Take by mouth.      Marland Kitchen albuterol (VENTOLIN HFA) 108 (90 BASE) MCG/ACT inhaler Inhale 2 puffs into the lungs every 4 (four) hours as needed for wheezing.  1 Inhaler  0  . BIOTIN PO Take 1 tablet by mouth daily.      . diazepam (VALIUM) 5 MG tablet Take 1 tablet (5 mg total) by mouth 2 (two) times daily.  10 tablet  0  . HYDROcodone-acetaminophen (NORCO/VICODIN) 5-325  MG per tablet Take 1 tablet by mouth every 4 (four) hours as needed for pain.  10 tablet  0  . Probiotic Product (PROBIOTIC FORMULA PO) Take by mouth.       No facility-administered medications prior to visit.    PE: Blood pressure 141/80, pulse 71, temperature 98.1 F (36.7 C), temperature source Temporal, resp. rate 18, height 5\' 8"  (1.727 m), weight 385 lb (174.635 kg), SpO2 95.00%. Gen: Alert, tired-appearing but in NAD. Patient is oriented to person, place, time, and situation. VS: noted--normal. Gen: alert, NAD, NONTOXIC APPEARING. HEENT: eyes without injection, drainage, or swelling.  Ears: EACs clear, TMs with normal light reflex and landmarks.  Nose: Clear rhinorrhea, with some dried, crusty exudate adherent to mildly injected mucosa.  No purulent d/c.  No paranasal sinus TTP.  No facial swelling.  Throat and mouth without focal lesion.  No pharyngial swelling, erythema, or exudate.   Neck: supple, no LAD.   LUNGS: CTA bilat, nonlabored resps.  She has a mildly prolonged exp phase and some post-exhalation coughing.    CV: RRR, no m/r/g. EXT: no c/c/e SKIN: no rash     IMPRESSION AND PLAN:  Allergic vs infectious rhinosinusitis, with mild acute asthma exacerbation. Prednisone 40mg  qd x 5d, levaquin 500mg  qd x 7d, continue albut (via neb or HFA) q4-6h prn. Needs  to start zyrtec 10mg  qd and flonase 2 sprays each nostril qd for the ragweed season. Signs/symptoms to call or return for were reviewed and pt expressed understanding. Flu vaccine IM today. An After Visit Summary was printed and given to the patient.  FOLLOW UP: prn

## 2013-08-29 NOTE — Telephone Encounter (Signed)
I don't know what she is talking about. Sounds like she needs office visit for evaluation and then I'll see what meds would be appropriate.-thx

## 2013-08-29 NOTE — Telephone Encounter (Signed)
Called patient to let her know about office visit. Patient scheduled for 08/29/13 at 3:45 pm.

## 2013-08-29 NOTE — Telephone Encounter (Signed)
Patient left message on vm requesting an antibiotic. Returned patient's call, LMOVM for patient to return call.

## 2013-08-29 NOTE — Telephone Encounter (Signed)
Please advise? Patient stated on the voice mail that you would understand why she needs an antibiotic due to her Asthma.

## 2013-10-01 NOTE — H&P (Deleted)
NAME:  Taylor Leon, FUJITA NO.:  [id number]  MEDICAL RECORD NO.:  [id number]  LOCATION:  [facility]                         FACILITY:  [facility]  PHYSICIAN:  Duke Salvia. Marcelle Overlie, M.D.DATE OF BIRTH:  Jan 06, 1956  DATE OF ADMISSION:  05/27/2013 DATE OF DISCHARGE:  05/27/2013                             HISTORY & PHYSICAL   CHIEF COMPLAINT:  Cystocele, vaginal pressure.  HISTORY OF PRESENT ILLNESS:  This is a 57 year old, G3, P2.  In 2012, this patient had a vaginal hysterectomy with mid urethral sling done by Dr. Morrell Riddle at [facility].  I first met her 7/14 with concerns about pelvic pressure from cystocele noted.  At that time, we performed [facility], which did return in the menopausal range.  She has a history of a placement of a carotid stent, and her neurologist had recommended again systemic ERT, although they did approve vaginal estrogen for local symptoms.  She has had increasing problems related to discomfort during sex and pelvic pressure related to the cystocele and now requests surgical repair.  Her exam showed normal cuff support with normal posterior wall support and she has remained continent.  The specific risks related to the procedure including risk of bleeding, infection, other complications that may require additional surgery along with her expected recovery time reviewed with her, which she understands and accepts.  She has met with anesthesia preoperatively and has been off of her daily adult baby aspirin in preparation for surgery.  PAST MEDICAL HISTORY:  ALLERGIES:  Penicillin and sulfa.  CURRENT MEDICATIONS:  Synthroid, Wellbutrin, fenofibrate, vitamin D, adult aspirin daily which is currently on hold, Zyrtec p.r.n., she is also getting Botox treatments by her neurologist.  PAST SURGICAL HISTORY:  Vaginal hysterectomy with mid urethral sling in 2012, thyroid surgery in 2008, stent placement for carotid partial occlusion in 2009.  REVIEW OF SYSTEMS:   Significant for history of headache, thyroid disease.  FAMILY HISTORY:  Otherwise unremarkable.  Her mother died recently of pancreatic and gallbladder cancer.  Also, history of heart disease, osteoporosis, and diverticulosis.  SOCIAL HISTORY:  Denies alcohol, tobacco, or drug use.  She is married. Dr. Yates Decamp is her medical doctor.  She also has a cardiologist and a neurologist that she sees.  PHYSICAL EXAMINATION:  VITAL SIGNS:  Temp 98.2, blood pressure 120/78. HEENT:  Unremarkable. NECK:  Supple without masses. LUNGS:  Cleared. CARDIOVASCULAR:  Regular rhythm without murmurs, rubs, gallops. BREASTS:  Without masses.  She does have implants. ABDOMEN:  Soft, flat, nontender. GU:  Vulva vagina reveals a moderate cystocele.  Cuff support is good. Posterior support is normal.  Bimanual otherwise negative.  IMPRESSION:  Symptomatic cystocele.  PLAN:  Anterior repair.  Procedure and risks discussed as above.     Amijah Timothy M. Marcelle Overlie, M.D.     [facility]/MEDQ  D:  09/30/2013  T:  10/01/2013  Job:  [id number]

## 2014-01-16 ENCOUNTER — Telehealth: Payer: Self-pay | Admitting: Family Medicine

## 2014-01-16 ENCOUNTER — Other Ambulatory Visit: Payer: Self-pay

## 2014-01-16 DIAGNOSIS — N6002 Solitary cyst of left breast: Secondary | ICD-10-CM

## 2014-01-16 DIAGNOSIS — Z1239 Encounter for other screening for malignant neoplasm of breast: Secondary | ICD-10-CM

## 2014-01-16 NOTE — Telephone Encounter (Signed)
I've entered orders in EPIC.  Pls fax to the numbers/person she mentioned.-thx

## 2014-01-16 NOTE — Telephone Encounter (Signed)
Patient had last mammo 02/2013 is due for diagnostic  bilateral mammo & bilateral breast US. Pls send orders attn: Maudie Mercury at [facility] fax [phone] ph [phone].

## 2014-01-21 NOTE — Telephone Encounter (Signed)
Done

## 2014-01-30 ENCOUNTER — Ambulatory Visit (INDEPENDENT_AMBULATORY_CARE_PROVIDER_SITE_OTHER): Payer: 59 | Admitting: Family Medicine

## 2014-01-30 ENCOUNTER — Ambulatory Visit (HOSPITAL_BASED_OUTPATIENT_CLINIC_OR_DEPARTMENT_OTHER)
Admission: RE | Admit: 2014-01-30 | Discharge: 2014-01-30 | Disposition: A | Discharge status: Home / Self Care | Payer: 59 | Source: Ambulatory Visit | Attending: Family Medicine | Admitting: Family Medicine

## 2014-01-30 ENCOUNTER — Encounter: Payer: Self-pay | Admitting: Family Medicine

## 2014-01-30 VITALS — BP 119/76 | HR 68 | Temp 98.4°F | Resp 18 | Ht 68.0 in | Wt 373.0 lb

## 2014-01-30 DIAGNOSIS — H8110 Benign paroxysmal vertigo, unspecified ear: Secondary | ICD-10-CM

## 2014-01-30 DIAGNOSIS — R062 Wheezing: Secondary | ICD-10-CM

## 2014-01-30 DIAGNOSIS — F17201 Nicotine dependence, unspecified, in remission: Secondary | ICD-10-CM

## 2014-01-30 DIAGNOSIS — J45909 Unspecified asthma, uncomplicated: Secondary | ICD-10-CM

## 2014-01-30 DIAGNOSIS — Z87891 Personal history of nicotine dependence: Secondary | ICD-10-CM

## 2014-01-30 DIAGNOSIS — Z0000 Encounter for general adult medical examination without abnormal findings: Secondary | ICD-10-CM

## 2014-01-30 HISTORY — DX: Benign paroxysmal vertigo, unspecified ear: H81.10

## 2014-01-30 LAB — CBC WITH DIFFERENTIAL/PLATELET
BASOS ABS: 0 10*3/uL (ref 0.0–0.1)
Basophils Relative: 0.4 % (ref 0.0–3.0)
EOS ABS: 0.2 10*3/uL (ref 0.0–0.7)
Eosinophils Relative: 2.6 % (ref 0.0–5.0)
HCT: 43.2 % (ref 36.0–46.0)
Hemoglobin: 13.7 g/dL (ref 12.0–15.0)
LYMPHS PCT: 31.4 % (ref 12.0–46.0)
Lymphs Abs: 2.6 10*3/uL (ref 0.7–4.0)
MCHC: 31.8 g/dL (ref 30.0–36.0)
MCV: 86.2 fl (ref 78.0–100.0)
MONOS PCT: 6.5 % (ref 3.0–12.0)
Monocytes Absolute: 0.5 10*3/uL (ref 0.1–1.0)
Neutro Abs: 4.9 10*3/uL (ref 1.4–7.7)
Neutrophils Relative %: 59.1 % (ref 43.0–77.0)
PLATELETS: 268 10*3/uL (ref 150.0–400.0)
RBC: 5.01 Mil/uL (ref 3.87–5.11)
RDW: 16 % — AB (ref 11.5–14.6)
WBC: 8.3 10*3/uL (ref 4.5–10.5)

## 2014-01-30 LAB — COMPREHENSIVE METABOLIC PANEL
ALT: 18 U/L (ref 0–35)
AST: 18 U/L (ref 0–37)
Albumin: 3.7 g/dL (ref 3.5–5.2)
Alkaline Phosphatase: 65 U/L (ref 39–117)
BILIRUBIN TOTAL: 0.6 mg/dL (ref 0.3–1.2)
BUN: 14 mg/dL (ref 6–23)
CALCIUM: 9.5 mg/dL (ref 8.4–10.5)
CHLORIDE: 106 meq/L (ref 96–112)
CO2: 30 meq/L (ref 19–32)
CREATININE: 0.9 mg/dL (ref 0.4–1.2)
GFR: 72.04 mL/min (ref >60.00)
GLUCOSE: 100 mg/dL — AB (ref 70–99)
Potassium: 4.9 mEq/L (ref 3.5–5.1)
SODIUM: 141 meq/L (ref 135–145)
TOTAL PROTEIN: 6.9 g/dL (ref 6.0–8.3)

## 2014-01-30 LAB — LIPID PANEL
CHOL/HDL RATIO: 2
Cholesterol: 168 mg/dL (ref 0–200)
HDL: 68.8 mg/dL (ref >39.00)
LDL Cholesterol: 80 mg/dL (ref 0–99)
TRIGLYCERIDES: 98 mg/dL (ref 0.0–149.0)
VLDL: 19.6 mg/dL (ref 0.0–40.0)

## 2014-01-30 LAB — TSH: TSH: 1.28 u[IU]/mL (ref 0.35–5.50)

## 2014-01-30 NOTE — Progress Notes (Signed)
Office Note 01/30/2014  CC:  Chief Complaint  Patient presents with  . Annual Exam    HPI:  Taylor Leon is a 58 y.o. Black female who is here for CPE. Saw allergist yesterday for check up, no changes made.   She is pretty asymptomatic overall, deals with some chronic nasal sx's and some intermittent wheeze and cough--both of very brief duration usually.  Rarely coughs up sputum that is blood tinged. Has had rare nose bleed--? Around the time of her blood tinged sputum?--she can't recall. Denies CP or SOB.   Past Medical History  Diagnosis Date  . H/O allergic rhinitis     Dr. Ishmael Holter  . Obesity     Currently goes to Bariatric clinic on [address] and gets weekly HCG injections, monthly vit B12 injections, and takes phentermine daily.  Marland Kitchen History of pneumonia 2009 and 2010  . Osteoarthritis of both knees     No improvement with steroid injections; did synvisc x 3 yrs; bilat replacement was recommended but she declined.  . Cyst, breast 01/2012; 12/3242; 0/1/02    Complicated cysts in upper outer left breast--no evidence of malignancy--f/u mammo/us in 1 yr from 02/26/13  . Recurrent low back pain   . Mild persistent asthma, well controlled 2014    New adult onset asthma after respiratory infection (Dr. Ishmael Holter)  . BPPV (benign paroxysmal positional vertigo) 01/30/2014    Past Surgical History  Procedure Laterality Date  . Cholecystectomy  1986  . Appendectomy  1978  . Tonsillectomy and adenoidectomy  1967ish  . Lumbar disc surgery  1993    L5/S1  . Cesarean section      x 3  . Total abdominal hysterectomy  1994    Ovaries are still in.  This was done for DUB/fibroids.  . Colonoscopy  2008    normal  . Cardiovascular stress test  2010    abnl per pt; f/u cath clean  . Cardiac catheterization  2010    Normal coronaries per pt    Family History  Problem Relation Age of Onset  . Arthritis Mother   . Heart disease Mother 42  . Hypertension Mother   . Arthritis Paternal  Grandmother     History   Social History  . Marital Status: Married    Spouse Name: N/A    Number of Children: N/A  . Years of Education: N/A   Occupational History  . Not on file.   Social History Main Topics  . Smoking status: Former Smoker    Types: Cigarettes    Quit date: 12/28/2009  . Smokeless tobacco: Never Used  . Alcohol Use: Yes     Comment: social  . Drug Use: No  . Sexual Activity: Not on file   Other Topics Concern  . Not on file   Social History Narrative   Married, 4 grown children.   Relocated to [place], [place] from [place] about 2012.   Works as Research scientist (life sciences).   Former smoker: 53 pack-yr hx, quit 2011.   Exercise: intermittently does water aerobics.Marland Kitchen               MEDS: flonase, vit D 1000 IU qd, albuterol q4h prn  Allergies  Allergen Reactions  . Penicillins Nausea Only and Rash    ROS Review of Systems  Constitutional: Negative for fever, chills, appetite change and fatigue.  HENT: Positive for postnasal drip (chronic) and rhinorrhea (chronic). Negative for congestion, dental problem, ear  pain and sore throat.   Eyes: Negative for discharge, redness and visual disturbance.  Respiratory: Positive for wheezing. Negative for cough, chest tightness and shortness of breath.   Cardiovascular: Negative for chest pain, palpitations and leg swelling.  Gastrointestinal: Negative for nausea, vomiting, abdominal pain, diarrhea and blood in stool.  Genitourinary: Negative for dysuria, urgency, frequency, hematuria, flank pain and difficulty urinating.  Musculoskeletal: Negative for arthralgias, back pain, joint swelling, myalgias and neck stiffness.  Skin: Negative for pallor and rash.  Neurological: Positive for dizziness (hx of bppv). Negative for speech difficulty, weakness and headaches.  Hematological: Negative for adenopathy. Does not bruise/bleed easily.  Psychiatric/Behavioral: Negative for confusion and sleep  disturbance. The patient is not nervous/anxious.      PE; Blood pressure 119/76, pulse 68, temperature 98.4 F (36.9 C), temperature source Temporal, resp. rate 18, height 5\' 8"  (1.727 m), weight 373 lb (169.192 kg), SpO2 94.00%. Gen: Alert, well appearing, obese-appearing.  Patient is oriented to person, place, time, and situation. AFFECT: pleasant, lucid thought and speech. ENT: Ears: EACs clear, normal epithelium.  TMs with good light reflex and landmarks bilaterally.  Eyes: no injection, icteris, swelling, or exudate.  EOMI, PERRLA. Nose: no drainage or turbinate edema/swelling.  No injection or focal lesion.  Mouth: lips without lesion/swelling.  Oral mucosa pink and moist.  Dentition intact and without obvious caries or gingival swelling.  Oropharynx without erythema, exudate, or swelling.  Neck: supple/nontender.  No LAD, mass, or TM.  Carotid pulses 2+ bilaterally. CV: RRR, no m/r/g.   LUNGS: CTA bilat, nonlabored resps, good aeration in all lung fields. ABD: soft, NT, rotund but ND, BS normal.  No hepatospenomegaly or mass.  No bruits. EXT: no clubbing, cyanosis, or edema.  Musculoskeletal: no joint swelling, erythema, warmth, or tenderness.  ROM of all joints intact. Skin - no sores or suspicious lesions or rashes or color changes Neuro: CN 2-12 intact bilaterally, strength 5/5 in proximal and distal upper extremities and lower extremities bilaterally.  No sensory deficits.  No tremor.  No disdiadochokinesis.  No ataxia.   No pronator drift.   Pertinent labs:  None today  ASSESSMENT AND PLAN:   Asthmatic bronchitis Currently pretty stable, just a touch of wheezing. Long hx of smoking.  Intermittent/rare episode of blood tinged sputum. Will check cxr today.  Health maintenance examination Reviewed age and gender appropriate health maintenance issues (prudent diet, regular exercise, health risks of tobacco and excessive alcohol, use of seatbelts, fire alarms in home, use of  sunscreen).  Also reviewed age and gender appropriate health screening as well as vaccine recommendations. Fasting health panel labs drawn today. Obesity/need for gradual wt loss is her biggest health concern long term, but she is also very concerned about her probs with chronic rhinitis and recurrent wheezing the last year or so.  She'll continue seeing her allergist.  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 Reviewed handout of home epley's maneuver as therapy for this condition.   An After Visit Summary was printed and given to the patient.  FOLLOW UP:  Return in about 1 year (around 01/30/2015) for CPE.

## 2014-01-30 NOTE — Progress Notes (Signed)
Pre visit review using our clinic review tool, if applicable. No additional management support is needed unless otherwise documented below in the visit note. 

## 2014-01-30 NOTE — Assessment & Plan Note (Signed)
Currently pretty stable, just a touch of wheezing. Long hx of smoking.  Intermittent/rare episode of blood tinged sputum. Will check cxr today.

## 2014-01-30 NOTE — Assessment & Plan Note (Addendum)
Reviewed age and gender appropriate health maintenance issues (prudent diet, regular exercise, health risks of tobacco and excessive alcohol, use of seatbelts, fire alarms in home, use of sunscreen).  Also reviewed age and gender appropriate health screening as well as vaccine recommendations. Fasting health panel labs drawn today. Obesity/need for gradual wt loss is her biggest health concern long term, but she is also very concerned about her probs with chronic rhinitis and recurrent wheezing the last year or so.  She'll continue seeing her allergist.

## 2014-01-30 NOTE — Assessment & Plan Note (Signed)
Still suffers from this chronically. Reviewed handout of home epley's maneuver as therapy for this condition.

## 2014-03-04 ENCOUNTER — Encounter: Payer: Self-pay | Admitting: Family Medicine

## 2014-06-02 ENCOUNTER — Encounter: Payer: Self-pay | Admitting: Nurse Practitioner

## 2014-06-02 ENCOUNTER — Ambulatory Visit (INDEPENDENT_AMBULATORY_CARE_PROVIDER_SITE_OTHER): Payer: 59 | Admitting: Nurse Practitioner

## 2014-06-02 VITALS — BP 98/67 | HR 79 | Temp 97.4°F | Ht 68.0 in | Wt 377.0 lb

## 2014-06-02 DIAGNOSIS — B9689 Other specified bacterial agents as the cause of diseases classified elsewhere: Secondary | ICD-10-CM

## 2014-06-02 DIAGNOSIS — H109 Unspecified conjunctivitis: Secondary | ICD-10-CM

## 2014-06-02 DIAGNOSIS — J069 Acute upper respiratory infection, unspecified: Secondary | ICD-10-CM

## 2014-06-02 DIAGNOSIS — H1089 Other conjunctivitis: Secondary | ICD-10-CM

## 2014-06-02 DIAGNOSIS — A499 Bacterial infection, unspecified: Secondary | ICD-10-CM

## 2014-06-02 MED ORDER — DOXYCYCLINE HYCLATE 100 MG PO TABS: 100.0000 mg | ORAL_TABLET | Freq: Two times a day (BID) | ORAL | Status: DC

## 2014-06-02 MED ORDER — POLYMYXIN B-TRIMETHOPRIM 10000-0.1 UNIT/ML-% OP SOLN: 1.0000 [drp] | OPHTHALMIC | Status: DC

## 2014-06-02 NOTE — Progress Notes (Signed)
Pre visit review using our clinic review tool, if applicable. No additional management support is needed unless otherwise documented below in the visit note. 

## 2014-06-02 NOTE — Progress Notes (Signed)
   Subjective:    Patient ID: Taylor Leon, female    DOB: 08/19/1956, 58 y.o.   MRN: [id number]  URI  This is a new problem. The current episode started in the past 7 days. The problem has been gradually worsening. There has been no fever. Associated symptoms include congestion, coughing, headaches, a plugged ear sensation, sinus pain, a sore throat and wheezing. Pertinent negatives include no ear pain or nausea. Associated symptoms comments: Red, draining L eye. She has tried nothing for the symptoms.      Review of Systems  Constitutional: Positive for fatigue. Negative for fever.  HENT: Positive for congestion, postnasal drip, sinus pressure, sore throat and voice change. Negative for ear pain.   Respiratory: Positive for cough, chest tightness and wheezing. Negative for shortness of breath.   Gastrointestinal: Negative for nausea.  Musculoskeletal: Negative for back pain and myalgias.  Neurological: Positive for headaches.       Objective:   Physical Exam  Vitals reviewed. Constitutional: She is oriented to person, place, and time. She appears well-developed and well-nourished. No distress.  HENT:  Head: Normocephalic and atraumatic.  Right Ear: External ear normal.  Left Ear: External ear normal.  Mouth/Throat: Oropharynx is clear and moist. No oropharyngeal exudate.  Nasal quality to voice, L TM retraction, vessels injected, bones visible.  Eyes: Pupils are equal, round, and reactive to light. Right eye exhibits no discharge. Left eye exhibits discharge.  Injected sclera L, mucoid d/c.  Neck: Normal range of motion. Neck supple. No thyromegaly present.  Cardiovascular: Normal rate, regular rhythm and normal heart sounds.   No murmur heard. Pulmonary/Chest: Effort normal and breath sounds normal. No respiratory distress. She has no wheezes. She has no rales.  Lymphadenopathy:    She has no cervical adenopathy.  Neurological: She is alert and oriented to person, place, and  time.  Skin: Skin is warm and dry.  Psychiatric: She has a normal mood and affect. Her behavior is normal. Thought content normal.          Assessment & Plan:  1. Bacterial conjunctivitis of left eye DD: viral, allergic - trimethoprim-polymyxin b (POLYTRIM) ophthalmic solution; Place 1 drop into the left eye every 4 (four) hours.  Dispense: 10 mL; Refill: 0  2. Acute upper respiratory infections of unspecified site - doxycycline (VIBRA-TABS) 100 MG tablet; Take 1 tablet (100 mg total) by mouth 2 (two) times daily.  Dispense: 10 tablet; Refill: 0  See pt instructions.

## 2014-06-02 NOTE — Patient Instructions (Signed)
Start oral and topical antibiotic. Start daily sinus rinse-Neilmed Sinus Rinse. Make solution of baby shampoo & water (1:1 parts), wash lids & lashes daily to safely remove crusts & mucous. Use inhaler as needed for cough. Benzocaine throat lozenge will help with throat comfort & may decrease cough. Spoonful of honey thinned with fresh lemon juice may help you cough less. Please call us if symptoms are not improving within a week, or you are feeling worse. You may cough for 2-3 weeks.

## 2014-12-26 DIAGNOSIS — R7303 Prediabetes: Secondary | ICD-10-CM | POA: Insufficient documentation

## 2014-12-26 HISTORY — DX: Prediabetes: R73.03

## 2015-03-05 ENCOUNTER — Encounter: Payer: Self-pay | Admitting: Family Medicine

## 2015-03-08 ENCOUNTER — Encounter: Payer: Self-pay | Admitting: Family Medicine

## 2015-03-18 ENCOUNTER — Encounter: Payer: Self-pay | Admitting: Family Medicine

## 2015-06-05 ENCOUNTER — Ambulatory Visit (INDEPENDENT_AMBULATORY_CARE_PROVIDER_SITE_OTHER): Payer: 59 | Admitting: Family Medicine

## 2015-06-05 ENCOUNTER — Encounter: Payer: Self-pay | Admitting: Family Medicine

## 2015-06-05 VITALS — BP 106/70 | HR 71 | Temp 98.2°F | Resp 16 | Ht 67.5 in | Wt 365.0 lb

## 2015-06-05 DIAGNOSIS — Z23 Encounter for immunization: Secondary | ICD-10-CM | POA: Diagnosis not present

## 2015-06-05 DIAGNOSIS — Z0000 Encounter for general adult medical examination without abnormal findings: Secondary | ICD-10-CM

## 2015-06-05 LAB — TSH: TSH: 1.33 u[IU]/mL (ref 0.35–4.50)

## 2015-06-05 LAB — COMPREHENSIVE METABOLIC PANEL
ALT: 16 U/L (ref 0–35)
AST: 21 U/L (ref 0–37)
Albumin: 4 g/dL (ref 3.5–5.2)
Alkaline Phosphatase: 76 U/L (ref 39–117)
BUN: 15 mg/dL (ref 6–23)
CO2: 28 meq/L (ref 19–32)
CREATININE: 0.88 mg/dL (ref 0.40–1.20)
Calcium: 9.6 mg/dL (ref 8.4–10.5)
Chloride: 107 mEq/L (ref 96–112)
GFR: 69.82 mL/min (ref >60.00)
GLUCOSE: 114 mg/dL — AB (ref 70–99)
POTASSIUM: 4.8 meq/L (ref 3.5–5.1)
Sodium: 141 mEq/L (ref 135–145)
Total Bilirubin: 0.6 mg/dL (ref 0.2–1.2)
Total Protein: 6.9 g/dL (ref 6.0–8.3)

## 2015-06-05 LAB — CBC WITH DIFFERENTIAL/PLATELET
BASOS ABS: 0.1 10*3/uL (ref 0.0–0.1)
Basophils Relative: 0.8 % (ref 0.0–3.0)
EOS ABS: 0.5 10*3/uL (ref 0.0–0.7)
Eosinophils Relative: 5.2 % — ABNORMAL HIGH (ref 0.0–5.0)
HEMATOCRIT: 43.6 % (ref 36.0–46.0)
HEMOGLOBIN: 14.2 g/dL (ref 12.0–15.0)
Lymphocytes Relative: 27.2 % (ref 12.0–46.0)
Lymphs Abs: 2.8 10*3/uL (ref 0.7–4.0)
MCHC: 32.5 g/dL (ref 30.0–36.0)
MCV: 84.7 fl (ref 78.0–100.0)
Monocytes Absolute: 0.6 10*3/uL (ref 0.1–1.0)
Monocytes Relative: 6.3 % (ref 3.0–12.0)
NEUTROS PCT: 60.5 % (ref 43.0–77.0)
Neutro Abs: 6.1 10*3/uL (ref 1.4–7.7)
PLATELETS: 282 10*3/uL (ref 150.0–400.0)
RBC: 5.14 Mil/uL — AB (ref 3.87–5.11)
RDW: 15.9 % — AB (ref 11.5–15.5)
WBC: 10.1 10*3/uL (ref 4.0–10.5)

## 2015-06-05 LAB — LIPID PANEL
CHOLESTEROL: 194 mg/dL (ref 0–200)
HDL: 71.4 mg/dL (ref >39.00)
LDL Cholesterol: 90 mg/dL (ref 0–99)
NONHDL: 122.6
Total CHOL/HDL Ratio: 3
Triglycerides: 165 mg/dL — ABNORMAL HIGH (ref 0.0–149.0)
VLDL: 33 mg/dL (ref 0.0–40.0)

## 2015-06-05 NOTE — Progress Notes (Signed)
Office Note 06/05/2015  CC:  Chief Complaint  Patient presents with  . Annual Exam    Pt is fasting.     HPI:  Taylor Leon is a 59 y.o. Black female who is here for health maintenance exam. She was layed off, thinking about retiring.  Vision exam, dental visit, and f/u with her allergist (Dr. Ishmael Holter) are all set up.  She no longer goes to the bariatric clinic: no longer gets BCG, phentermine, or vit B12.  No acute complaints except lately having a bit of tingling in tips of 4th and 5th fingers. No hand/arm weakness, no recent shoulder, neck, or arm/wrist injury.  No pain. No dysarthria or difficulty swallowing.  No HA's.   Past Medical History  Diagnosis Date  . H/O allergic rhinitis     Dr. Ishmael Holter  . Obesity     Currently goes to Bariatric clinic on [address] and gets weekly HCG injections, monthly vit B12 injections, and takes phentermine daily.  Marland Kitchen History of pneumonia 2009 and 2010  . Osteoarthritis of both knees     No improvement with steroid injections; did synvisc x 3 yrs; bilat replacement was recommended but she declined.  . Cyst, breast 01/2012; 06/2012; 9/0/38; 02/25/37    Complicated cysts in upper outer left breast--no evidence of malignancy--f/u bilat diag mammo/left breast u/s on 03/06/15 showed resolution of left breast cysts.  Repeat screening mammogram 1 yr.  . Recurrent low back pain   . Mild persistent asthma, well controlled 2014    New adult onset asthma after respiratory infection (Dr. Ishmael Holter)  . BPPV (benign paroxysmal positional vertigo) 01/30/2014    Past Surgical History  Procedure Laterality Date  . Cholecystectomy  1986  . Appendectomy  1978  . Tonsillectomy and adenoidectomy  1967ish  . Lumbar disc surgery  1993    L5/S1  . Cesarean section      x 3  . Total abdominal hysterectomy  1994    Ovaries are still in.  This was done for DUB/fibroids.  . Colonoscopy  2008    normal per pt report (in [place])  . Cardiovascular stress test  2010   abnl per pt; f/u cath clean  . Cardiac catheterization  2010    Normal coronaries per pt    Family History  Problem Relation Age of Onset  . Arthritis Mother   . Heart disease Mother 31  . Hypertension Mother   . Arthritis Paternal Grandmother     History   Social History  . Marital Status: Married    Spouse Name: N/A  . Number of Children: N/A  . Years of Education: N/A   Occupational History  . Not on file.   Social History Main Topics  . Smoking status: Former Smoker    Types: Cigarettes    Quit date: 12/28/2009  . Smokeless tobacco: Never Used  . Alcohol Use: Yes     Comment: social  . Drug Use: No  . Sexual Activity: Not on file   Other Topics Concern  . Not on file   Social History Narrative   Married, 4 grown children.   Relocated to [place], [place] from [place] about 2012.   Works as Research scientist (life sciences).   Former smoker: 33 pack-yr hx, quit 2011.   Exercise: intermittently does water aerobics.Marland Kitchen               MEDS: not on any meds currently Outpatient Prescriptions Prior to Visit  Medication Sig Dispense Refill  . albuterol (PROVENTIL) (2.5 MG/3ML) 0.083% nebulizer solution Take 3 mLs (2.5 mg total) by nebulization every 6 (six) hours as needed for wheezing. 75 mL 0  . cholecalciferol (VITAMIN D) 1000 UNITS tablet Take 1,000 Units by mouth daily.    . fluticasone (FLONASE) 50 MCG/ACT nasal spray 2 sprays in each nostril every day 16 g 6  . Loratadine (CLARITIN PO) Take by mouth as needed. OTC    . albuterol (VENTOLIN HFA) 108 (90 BASE) MCG/ACT inhaler Inhale 2 puffs into the lungs every 4 (four) hours as needed for wheezing. 1 Inhaler 0  . BIOTIN PO Take 1 tablet by mouth daily.    Marland Kitchen doxycycline (VIBRA-TABS) 100 MG tablet Take 1 tablet (100 mg total) by mouth 2 (two) times daily. (Patient not taking: Reported on 06/05/2015) 10 tablet 0  . ECHINACEA PO Take by mouth.    . trimethoprim-polymyxin b (POLYTRIM) ophthalmic solution Place 1  drop into the left eye every 4 (four) hours. (Patient not taking: Reported on 06/05/2015) 10 mL 0   No facility-administered medications prior to visit.    Allergies  Allergen Reactions  . Penicillins Nausea Only and Rash    ROS Review of Systems  Constitutional: Negative for fever, chills, appetite change and fatigue.  HENT: Negative for congestion, dental problem, ear pain and sore throat.   Eyes: Negative for discharge, redness and visual disturbance.  Respiratory: Negative for cough, chest tightness, shortness of breath and wheezing.   Cardiovascular: Negative for chest pain, palpitations and leg swelling.  Gastrointestinal: Negative for nausea, vomiting, abdominal pain, diarrhea and blood in stool.  Genitourinary: Negative for dysuria, urgency, frequency, hematuria, flank pain and difficulty urinating.  Musculoskeletal: Negative for myalgias, back pain, joint swelling, arthralgias and neck stiffness.  Skin: Negative for pallor and rash.  Neurological: Negative for dizziness, speech difficulty, weakness and headaches.       Some tingling in 4th and 5th digits of R hand lately--mostly fingertips.  NO color changes.    Hematological: Negative for adenopathy. Does not bruise/bleed easily.  Psychiatric/Behavioral: Negative for confusion and sleep disturbance. The patient is not nervous/anxious.     PE; Blood pressure 106/70, pulse 71, temperature 98.2 F (36.8 C), temperature source Oral, resp. rate 16, height 5' 7.5" (1.715 m), weight 365 lb (165.563 kg), SpO2 94 %. Gen: Alert, well appearing.  Patient is oriented to person, place, time, and situation. AFFECT: pleasant, lucid thought and speech. ENT: Ears: EACs clear, normal epithelium.  TMs with good light reflex and landmarks bilaterally.  Eyes: no injection, icteris, swelling, or exudate.  EOMI, PERRLA. Nose: no drainage or turbinate edema/swelling.  No injection or focal lesion.  Mouth: lips without lesion/swelling.  Oral mucosa  pink and moist.  Dentition intact and without obvious caries or gingival swelling.  Oropharynx without erythema, exudate, or swelling.  Neck: supple/nontender.  No LAD, mass, or TM.  Carotid pulses 2+ bilaterally, without bruits. CV: RRR, no m/r/g.   LUNGS: CTA bilat, nonlabored resps, good aeration in all lung fields. ABD: soft, NT, ND, BS normal.  No hepatospenomegaly or mass.  No bruits. EXT: no clubbing, cyanosis, or edema.  Musculoskeletal: no joint swelling, erythema, warmth, or tenderness.  ROM of all joints intact. Skin - no sores or suspicious lesions or rashes or color changes   Pertinent labs:  None today  ASSESSMENT AND PLAN:   Health maintenance exam: Reviewed age and gender appropriate health maintenance issues (prudent diet, regular exercise, health risks  of tobacco and excessive alcohol, use of seatbelts, fire alarms in home, use of sunscreen).  Also reviewed age and gender appropriate health screening as well as vaccine recommendations. Tdap today. HP labs (fasting) today. Next mammogram due 02/2016. Not a cervical cancer screening candidate due to hx of hysterectomy for benign reasons. Next colonoscopy due 2018 (10 yr repeat). 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  FOLLOW UP:  No Follow-up on file.

## 2015-06-05 NOTE — Progress Notes (Signed)
Pre visit review using our clinic review tool, if applicable. No additional management support is needed unless otherwise documented below in the visit note. 

## 2015-06-08 ENCOUNTER — Other Ambulatory Visit (INDEPENDENT_AMBULATORY_CARE_PROVIDER_SITE_OTHER): Payer: 59

## 2015-06-08 ENCOUNTER — Other Ambulatory Visit: Payer: Self-pay | Admitting: Family Medicine

## 2015-06-08 DIAGNOSIS — R7301 Impaired fasting glucose: Secondary | ICD-10-CM

## 2015-06-08 LAB — HEMOGLOBIN A1C: HEMOGLOBIN A1C: 6.3 % (ref 4.6–6.5)

## 2015-06-10 ENCOUNTER — Telehealth: Payer: Self-pay | Admitting: *Deleted

## 2015-06-10 ENCOUNTER — Other Ambulatory Visit: Payer: Self-pay | Admitting: Family Medicine

## 2015-06-10 MED ORDER — METFORMIN HCL 500 MG PO TABS: 500.0000 mg | ORAL_TABLET | Freq: Two times a day (BID) | ORAL | Status: DC

## 2015-06-10 NOTE — Progress Notes (Signed)
OK, metformin rx sent in.

## 2015-06-10 NOTE — Telephone Encounter (Signed)
Pt called requesting Rx for glucose meter, test strips and lancets. She stated that she would like to monitor this at home to see how her blood sugars are doing through out the day. Pharm: [facility]. Please advise. Thanks.

## 2015-06-11 NOTE — Telephone Encounter (Signed)
Pt needs to specify what specific brand of monitor and strips her insurer will cover, then I can write rx for this stuff.-thx

## 2015-06-12 ENCOUNTER — Encounter: Payer: Self-pay | Admitting: *Deleted

## 2015-06-12 NOTE — Telephone Encounter (Signed)
[name] stating that she spoke to her insurance and they advised her that they did not have a preference and it would be up to the provider. I have typed up Rx for OneTouch Ultra Mini glucose meter with test strips and lancets. Please sign so this can be faxed to pharmacy. Thanks.

## 2015-06-12 NOTE — Telephone Encounter (Signed)
Rx signed by Janann August and faxed to pharmacy. Pt advised and voiced understanding.

## 2015-06-12 NOTE — Telephone Encounter (Signed)
Pt called and was advised and voiced understanding. She is going to call back and let us know what glucose meter her insurance recommends.

## 2015-06-30 ENCOUNTER — Telehealth: Payer: Self-pay | Admitting: *Deleted

## 2015-06-30 MED ORDER — METFORMIN HCL 500 MG PO TABS: 500.0000 mg | ORAL_TABLET | Freq: Two times a day (BID) | ORAL | Status: DC

## 2015-06-30 NOTE — Telephone Encounter (Signed)
Pt called and stated that she has been taking the metformin once in the evening starting on 06/12/15 after a few days of checking her blood sugar in the morning and the evening she noticed that her blood sugars were higher in the morning so she started taking the metformin once in the morning but not in the evening on 06/17/15. She stated that after a few days she still had not noticed any change in her blood sugar so she started taking one in the morning and one in the evening on 06/25/15. She stated that she still has not noticed any change in her blood sugar reading and is concerned about this. Please advise. Thanks.

## 2015-07-01 MED ORDER — METFORMIN HCL 1000 MG PO TABS: 1000.0000 mg | ORAL_TABLET | Freq: Two times a day (BID) | ORAL | Status: DC

## 2015-07-01 NOTE — Telephone Encounter (Signed)
Left message to call back  

## 2015-07-01 NOTE — Telephone Encounter (Signed)
Pt advised and voiced understanding. She how long she should continue this medication. Per Dr. Anitra Lauth continue medication for now, f/u in 3 months for ov and labs. Pt advised and voiced understanding.

## 2015-07-01 NOTE — Telephone Encounter (Signed)
OK. Reassure her. Increase metformin to 1000 mg twice a day: tell her this is the usual "therapeutic dose" but we have patients start at lower dose so they don't suffer the gastrointestinal side effects that this med sometimes gives.   I sent in rx for the 1000 mg bid dosing.-thx

## 2015-07-01 NOTE — Telephone Encounter (Signed)
Did she ever give you an actual glucose NUMBERS?? If I can have that kind of specific info then I can decide if she should increase her metformin or just reassure her to be patient.-thx

## 2015-07-01 NOTE — Telephone Encounter (Signed)
When I spoke to her yesterday she stated that her fasting blood sugar in the mornings would be 100-120.

## 2015-07-02 NOTE — Telephone Encounter (Signed)
[name] stating that because she has been checking her blood sugars twice a day she is almost out of test strips and the pharmacy will not fill them early unless Dr. Anitra Lauth advises to do so. Per Dr. Anitra Lauth pt is only Pre-diabetic not necessary for her to be checking blood sugars more than once a day if that. Please advise that we will not approve early refill, needs to cut back to checking blood sugar once daily or once every few days and give medication time to work, no risk of hypoglycemia with this medication. Pt advised and voiced understanding.

## 2015-09-07 ENCOUNTER — Other Ambulatory Visit: Payer: Self-pay | Admitting: *Deleted

## 2015-09-07 MED ORDER — METFORMIN HCL 1000 MG PO TABS: 1000.0000 mg | ORAL_TABLET | Freq: Two times a day (BID) | ORAL | Status: DC

## 2015-09-07 NOTE — Telephone Encounter (Signed)
RF request for metformin LOV: 06/05/15 Next ov: None Last written: 07/01/15 #60 w/ 6RF

## 2015-09-10 ENCOUNTER — Ambulatory Visit (INDEPENDENT_AMBULATORY_CARE_PROVIDER_SITE_OTHER): Payer: Commercial Managed Care - HMO | Admitting: Family Medicine

## 2015-09-10 ENCOUNTER — Encounter: Payer: Self-pay | Admitting: *Deleted

## 2015-09-10 ENCOUNTER — Encounter: Payer: Self-pay | Admitting: Family Medicine

## 2015-09-10 VITALS — BP 132/79 | HR 78 | Temp 97.9°F | Resp 16 | Ht 67.5 in | Wt 349.0 lb

## 2015-09-10 DIAGNOSIS — E8881 Metabolic syndrome: Secondary | ICD-10-CM

## 2015-09-10 DIAGNOSIS — Z23 Encounter for immunization: Secondary | ICD-10-CM

## 2015-09-10 DIAGNOSIS — J018 Other acute sinusitis: Secondary | ICD-10-CM | POA: Diagnosis not present

## 2015-09-10 DIAGNOSIS — R739 Hyperglycemia, unspecified: Secondary | ICD-10-CM

## 2015-09-10 LAB — POCT GLYCOSYLATED HEMOGLOBIN (HGB A1C): Hemoglobin A1C: 5.7

## 2015-09-10 MED ORDER — CLINDAMYCIN HCL 300 MG PO CAPS: ORAL_CAPSULE | ORAL | Status: DC

## 2015-09-10 NOTE — Progress Notes (Signed)
Pre visit review using our clinic review tool, if applicable. No additional management support is needed unless otherwise documented below in the visit note. 

## 2015-09-10 NOTE — Progress Notes (Signed)
OFFICE NOTE  09/10/2015  CC:  Chief Complaint  Patient presents with  . URI    x 3 days     HPI: Patient is a 59 y.o. African-American female who is here for runny nose, congestion, coughing that started 3-4 days ago, sx's superimposed on her chronic rhinitis, says she has hx of "quickly getting sinus infections in Sept and March", is very convinced she has bacterial sinus infection currently.  No pain in face or upper teeth.  Feels some wheezing and chest tightness lately, says albuterol inhaler helps her chest.  Says she hurts some around nose but not face (admits this could be from crying a lot lately due to loss of her new grandchild to SIDS). Sputum from nose thicker lately.  No fever.  Also, pt with prediabetes, has been on metformin for the last 3 + months. Morning gluc: 105-117 fasting 2H PP supper: 120s She is eating a diabetic diet for the most part.  Pertinent PMH:  Past medical, surgical, social, and family history reviewed and no changes are noted since last office visit.  MEDS:  QVAR 2 puffs bid--started Sept 1st Allegra 180 mg qAM Saline nasal rinses qd-bid Outpatient Prescriptions Prior to Visit  Medication Sig Dispense Refill  . albuterol (PROVENTIL) (2.5 MG/3ML) 0.083% nebulizer solution Take 3 mLs (2.5 mg total) by nebulization every 6 (six) hours as needed for wheezing. 75 mL 0  . metFORMIN (GLUCOPHAGE) 1000 MG tablet Take 1 tablet (1,000 mg total) by mouth 2 (two) times daily with a meal. 180 tablet 1  . albuterol (VENTOLIN HFA) 108 (90 BASE) MCG/ACT inhaler Inhale 2 puffs into the lungs every 4 (four) hours as needed for wheezing. 1 Inhaler 0   No facility-administered medications prior to visit.    PE: Blood pressure 132/79, pulse 78, temperature 97.9 F (36.6 C), temperature source Oral, resp. rate 16, height 5' 7.5" (1.715 m), weight 349 lb (158.305 kg), SpO2 93 %. 16 lb purposeful wt loss since last visit! VS: noted--normal. Gen: alert, NAD, NONTOXIC  APPEARING. HEENT: eyes without injection, drainage, or swelling.  Ears: EACs clear, TMs with normal light reflex and landmarks.  Nose: Clear rhinorrhea, with some dried, crusty exudate adherent to mildly injected mucosa.  No purulent d/c.  No paranasal sinus TTP.  No facial swelling.  Throat and mouth without focal lesion.  No pharyngial swelling, erythema, or exudate.   Neck: supple, no LAD.   LUNGS: CTA bilat, nonlabored resps.   Some post-exhalation coughing but nothing persistent.  Exp phase not prolonged. CV: RRR, no m/r/g. EXT: no c/c/e SKIN: no rash  LAB: POCT HbA1c today is 5.7%  IMPRESSION AND PLAN:  1) Acute sinusitis: discussed abx pros and cons with pt and she is aware of these and wants to proceed with abx: clindamycin 300 mg tid x 10d. Continue all other current treatments.  2) Adult onset asthma: I see no sign of acute asthma flare at this time, no indication for systemic steroids. Flu vaccine today.  3) Insulin resistance/prediabetes: on metformin 1000 mg bid. Great response to metformin 1000 mg bid.  She'll continue her diet and wt loss efforts, continue home gluc monitoring and if these continue as-is then I recommended she try going down to 500 mg metformin bid. For now she'll continue 1000 mg bid metformin.  Spent 30 min with pt today, with >50% of this time spent in counseling and care coordination regarding the above problems.  An After Visit Summary was printed and given  to the patient.  FOLLOW UP: 6 mo f/u prediabetes

## 2015-12-23 ENCOUNTER — Ambulatory Visit (INDEPENDENT_AMBULATORY_CARE_PROVIDER_SITE_OTHER): Payer: Commercial Managed Care - HMO | Admitting: Family Medicine

## 2015-12-23 ENCOUNTER — Encounter: Payer: Self-pay | Admitting: Family Medicine

## 2015-12-23 VITALS — BP 123/79 | HR 67 | Temp 98.0°F | Resp 16 | Ht 67.5 in | Wt 331.8 lb

## 2015-12-23 DIAGNOSIS — R7303 Prediabetes: Secondary | ICD-10-CM

## 2015-12-23 LAB — POCT GLYCOSYLATED HEMOGLOBIN (HGB A1C): Hemoglobin A1C: 5.8

## 2015-12-23 NOTE — Progress Notes (Signed)
Pre visit review using our clinic review tool, if applicable. No additional management support is needed unless otherwise documented below in the visit note. 

## 2015-12-23 NOTE — Progress Notes (Signed)
OFFICE VISIT  12/23/2015   CC:  Chief Complaint  Patient presents with  . Follow-up    Pt is not fasting.    HPI:    Patient is a 59 y.o. African-American female who presents for 3 mo f/u prediabetes: has been on metformin for 4mo + diabetic diet (fair).  Still currently doing 1000 mg metformin bid.  Has about 2 loose BMs per day since getting on metformin. Fasting glucoses 115-120 avg. Occ check later in day: 90s.  Wt is down 18 more lbs since last f/u visit 3 mo ago! Doing water aerobics.     Past Medical History  Diagnosis Date  . H/O allergic rhinitis     Dr. Ishmael Holter  . Obesity     At one point in time she was going to Bariatric clinic on [address] and got weekly HCG injections, monthly vit B12 injections, and took phentermine daily.  As of 05/2015 she was no longer doing this.  . History of pneumonia 2009 and 2010  . Osteoarthritis of both knees     No improvement with steroid injections; did synvisc x 3 yrs; bilat replacement was recommended but she declined.  . Cyst, breast 01/2012; 06/2012; 0000000; [id number]    Complicated cysts in upper outer left breast--no evidence of malignancy--f/u bilat diag mammo/left breast u/s on 03/06/15 showed resolution of left breast cysts.  Repeat screening mammogram 1 yr.  . Recurrent low back pain   . Mild persistent asthma, well controlled 2014    New adult onset asthma after respiratory infection (Dr. Ishmael Holter)  . BPPV (benign paroxysmal positional vertigo) 01/30/2014  . Prediabetes 2016    05/2015 A1c was 6.3%    Past Surgical History  Procedure Laterality Date  . Cholecystectomy  1986  . Appendectomy  1978  . Tonsillectomy and adenoidectomy  1967ish  . Lumbar disc surgery  1993    L5/S1  . Cesarean section      x 3  . Total abdominal hysterectomy  1994    Ovaries are still in.  This was done for DUB/fibroids.  . Colonoscopy  2008    normal per pt report (in [place])  . Cardiovascular stress test  2010    abnl per pt; f/u cath clean   . Cardiac catheterization  2010    Normal coronaries per pt    Outpatient Prescriptions Prior to Visit  Medication Sig Dispense Refill  . albuterol (PROVENTIL) (2.5 MG/3ML) 0.083% nebulizer solution Take 3 mLs (2.5 mg total) by nebulization every 6 (six) hours as needed for wheezing. 75 mL 0  . metFORMIN (GLUCOPHAGE) 1000 MG tablet Take 1 tablet (1,000 mg total) by mouth 2 (two) times daily with a meal. 180 tablet 1  . albuterol (VENTOLIN HFA) 108 (90 BASE) MCG/ACT inhaler Inhale 2 puffs into the lungs every 4 (four) hours as needed for wheezing. 1 Inhaler 0  . clindamycin (CLEOCIN) 300 MG capsule 1 tab po tid x 10d (Patient not taking: Reported on 12/23/2015) 30 capsule 0   No facility-administered medications prior to visit.    Allergies  Allergen Reactions  . Penicillins Nausea Only and Rash    ROS As per HPI  PE: Blood pressure 123/79, pulse 67, temperature 98 F (36.7 C), temperature source Oral, resp. rate 16, height 5' 7.5" (1.715 m), weight 331 lb 12 oz (150.481 kg), SpO2 92 %. Gen: Alert, well appearing.  Patient is oriented to person, place, time, and situation. AFFECT: pleasant, lucid thought and speech. No  further exam today.  LABS:  Lab Results  Component Value Date   HGBA1C 5.7 09/10/2015     Chemistry      Component Value Date/Time   NA 141 06/05/2015 0849   K 4.8 06/05/2015 0849   CL 107 06/05/2015 0849   CO2 28 06/05/2015 0849   BUN 15 06/05/2015 0849   CREATININE 0.88 06/05/2015 0849      Component Value Date/Time   CALCIUM 9.6 06/05/2015 0849   ALKPHOS 76 06/05/2015 0849   AST 21 06/05/2015 0849   ALT 16 06/05/2015 0849   BILITOT 0.6 06/05/2015 0849     Lab Results  Component Value Date   CHOL 194 06/05/2015   HDL 71.40 06/05/2015   LDLCALC 90 06/05/2015   TRIG 165.0* 06/05/2015   CHOLHDL 3 06/05/2015   POCT HbA1c today: 5.8%  IMPRESSION AND PLAN:  Prediabetes: doing great with TLC/Wt loss. Continue metformin: decrease metformin to  500 mg qAM and continue 1000 mg qhs to see if glucoses stay ok and GI side effects get better. She really is worried and wants to continue with our plan for another recheck of HbA1c in 3 mo. We'll recheck BMET at that time.  An After Visit Summary was printed and given to the patient.  FOLLOW UP: Return in about 3 months (around 03/22/2016) for routine chronic illness f/u.

## 2016-02-25 MED FILL — ONE TOUCH ULTRA TEST STRIPS: 25 days supply | Qty: 50 | Fill #2

## 2016-03-07 ENCOUNTER — Other Ambulatory Visit: Payer: Self-pay | Admitting: *Deleted

## 2016-03-07 MED ORDER — METFORMIN HCL 1000 MG PO TABS: 1000.0000 mg | ORAL_TABLET | Freq: Two times a day (BID) | ORAL | Status: DC

## 2016-03-07 NOTE — Telephone Encounter (Signed)
RF request for metformin LOV: 12/23/15 Next ov: 03/22/16 Last written: 09/07/15 #180 w/ 1RF

## 2016-03-22 ENCOUNTER — Encounter: Payer: Self-pay | Admitting: Family Medicine

## 2016-03-22 ENCOUNTER — Ambulatory Visit (INDEPENDENT_AMBULATORY_CARE_PROVIDER_SITE_OTHER): Payer: 59 | Admitting: Family Medicine

## 2016-03-22 VITALS — BP 104/70 | HR 64 | Temp 98.1°F | Ht 67.5 in | Wt 325.0 lb

## 2016-03-22 DIAGNOSIS — R7303 Prediabetes: Secondary | ICD-10-CM | POA: Diagnosis not present

## 2016-03-22 LAB — POCT GLYCOSYLATED HEMOGLOBIN (HGB A1C): HEMOGLOBIN A1C: 5.8

## 2016-03-22 NOTE — Progress Notes (Signed)
Pre visit review using our clinic review tool, if applicable. No additional management support is needed unless otherwise documented below in the visit note. 

## 2016-03-22 NOTE — Progress Notes (Signed)
OFFICE VISIT  03/22/2016   CC:  Chief Complaint  Patient presents with  . Follow-up    A1c     HPI:    Patient is a 60 y.o. African-American female who presents for 3 mo f/u prediabetes. Has been on metformin for 9+ mo plus diet. Last visit we had decided to see how she does OFF metformin.  Her glucoses went back up to 120-130 fasting so she got back on metformin 1000 mg bid. +diabetic diet, +water aerobics.  She'll be starting knee injections tomorrow with an office called [facility]. Having some arthritis pain in both knees lately.  Past Medical History  Diagnosis Date  . H/O allergic rhinitis     Dr. Ishmael Holter  . Obesity     At one point in time she was going to Bariatric clinic on [address] and got weekly HCG injections, monthly vit B12 injections, and took phentermine daily.  As of 05/2015 she was no longer doing this.  . History of pneumonia 2009 and 2010  . Osteoarthritis of both knees     No improvement with steroid injections; did synvisc x 3 yrs; bilat replacement was recommended but she declined.  . Cyst, breast 01/2012; 06/2012; 0000000; 123456    Complicated cysts in upper outer left breast--no evidence of malignancy--f/u bilat diag mammo/left breast u/s on 03/06/15 showed resolution of left breast cysts.  Repeat screening mammogram 1 yr.  . Recurrent low back pain   . Mild persistent asthma, well controlled 2014    New adult onset asthma after respiratory infection (Dr. Ishmael Holter)  . BPPV (benign paroxysmal positional vertigo) 01/30/2014  . Prediabetes 2016    05/2015 A1c was 6.3%    Past Surgical History  Procedure Laterality Date  . Cholecystectomy  1986  . Appendectomy  1978  . Tonsillectomy and adenoidectomy  1967ish  . Lumbar disc surgery  1993    L5/S1  . Cesarean section      x 3  . Total abdominal hysterectomy  1994    Ovaries are still in.  This was done for DUB/fibroids.  . Colonoscopy  2008    normal per pt report (in [place])  . Cardiovascular stress  test  2010    abnl per pt; f/u cath clean  . Cardiac catheterization  2010    Normal coronaries per pt    Outpatient Prescriptions Prior to Visit  Medication Sig Dispense Refill  . albuterol (PROVENTIL) (2.5 MG/3ML) 0.083% nebulizer solution Take 3 mLs (2.5 mg total) by nebulization every 6 (six) hours as needed for wheezing. 75 mL 0  . metFORMIN (GLUCOPHAGE) 1000 MG tablet Take 1 tablet (1,000 mg total) by mouth 2 (two) times daily with a meal. 180 tablet 1  . albuterol (VENTOLIN HFA) 108 (90 BASE) MCG/ACT inhaler Inhale 2 puffs into the lungs every 4 (four) hours as needed for wheezing. 1 Inhaler 0   No facility-administered medications prior to visit.    Allergies  Allergen Reactions  . Penicillins Nausea Only and Rash    ROS As per HPI  PE: Blood pressure 104/70, pulse 64, temperature 98.1 F (36.7 C), temperature source Oral, height 5' 7.5" (1.715 m), weight 325 lb (147.419 kg), SpO2 95 %. Gen: Alert, well appearing.  Patient is oriented to person, place, time, and situation. AFFECT: pleasant, lucid thought and speech. No further exam today.  LABS:  Lab Results  Component Value Date   TSH 1.33 06/05/2015   Lab Results  Component Value Date  WBC 10.1 06/05/2015   HGB 14.2 06/05/2015   HCT 43.6 06/05/2015   MCV 84.7 06/05/2015   PLT 282.0 06/05/2015   Lab Results  Component Value Date   CREATININE 0.88 06/05/2015   BUN 15 06/05/2015   NA 141 06/05/2015   K 4.8 06/05/2015   CL 107 06/05/2015   CO2 28 06/05/2015   Lab Results  Component Value Date   ALT 16 06/05/2015   AST 21 06/05/2015   ALKPHOS 76 06/05/2015   BILITOT 0.6 06/05/2015   Lab Results  Component Value Date   CHOL 194 06/05/2015   Lab Results  Component Value Date   HDL 71.40 06/05/2015   Lab Results  Component Value Date   LDLCALC 90 06/05/2015   Lab Results  Component Value Date   TRIG 165.0* 06/05/2015   Lab Results  Component Value Date   CHOLHDL 3 06/05/2015   Lab  Results  Component Value Date   HGBA1C 5.8 03/22/2016   IMPRESSION AND PLAN:  Prediabetes: POCT HbA1c today was 5.8%!  Discussed plan with pt and she definitely prefers to stay on metformin 1000 mg bid, continue with TLC.  An After Visit Summary was printed and given to the patient.  FOLLOW UP: Return in about 3 months (around 06/22/2016) for annual CPE (fasting).

## 2016-04-28 MED FILL — ONE TOUCH ULTRA TEST STRIPS: 25 days supply | Qty: 50 | Fill #3

## 2016-05-05 ENCOUNTER — Encounter: Payer: Self-pay | Admitting: Family Medicine

## 2016-06-20 MED FILL — ONE TOUCH DELICA 33G LANCET: 30 days supply | Qty: 100 | Fill #2

## 2016-06-20 MED FILL — ONE TOUCH ULTRA TEST STRIPS: 25 days supply | Qty: 50 | Fill #4

## 2016-06-22 ENCOUNTER — Encounter: Payer: Self-pay | Admitting: Family Medicine

## 2016-06-22 ENCOUNTER — Ambulatory Visit (INDEPENDENT_AMBULATORY_CARE_PROVIDER_SITE_OTHER): Payer: 59 | Admitting: Family Medicine

## 2016-06-22 VITALS — BP 108/66 | HR 62 | Temp 98.3°F | Resp 16 | Ht 67.0 in | Wt 323.2 lb

## 2016-06-22 DIAGNOSIS — Z0000 Encounter for general adult medical examination without abnormal findings: Secondary | ICD-10-CM | POA: Diagnosis not present

## 2016-06-22 DIAGNOSIS — R7303 Prediabetes: Secondary | ICD-10-CM | POA: Diagnosis not present

## 2016-06-22 LAB — CBC WITH DIFFERENTIAL/PLATELET
BASOS PCT: 0.4 % (ref 0.0–3.0)
Basophils Absolute: 0 10*3/uL (ref 0.0–0.1)
EOS PCT: 2.7 % (ref 0.0–5.0)
Eosinophils Absolute: 0.2 10*3/uL (ref 0.0–0.7)
HEMATOCRIT: 40.2 % (ref 36.0–46.0)
HEMOGLOBIN: 13 g/dL (ref 12.0–15.0)
Lymphocytes Relative: 27.4 % (ref 12.0–46.0)
Lymphs Abs: 2.4 10*3/uL (ref 0.7–4.0)
MCHC: 32.3 g/dL (ref 30.0–36.0)
MCV: 85.5 fl (ref 78.0–100.0)
MONO ABS: 0.6 10*3/uL (ref 0.1–1.0)
Monocytes Relative: 6.3 % (ref 3.0–12.0)
NEUTROS ABS: 5.5 10*3/uL (ref 1.4–7.7)
Neutrophils Relative %: 63.2 % (ref 43.0–77.0)
Platelets: 281 10*3/uL (ref 150.0–400.0)
RBC: 4.7 Mil/uL (ref 3.87–5.11)
RDW: 16 % — AB (ref 11.5–15.5)
WBC: 8.8 10*3/uL (ref 4.0–10.5)

## 2016-06-22 LAB — LIPID PANEL
CHOL/HDL RATIO: 2
Cholesterol: 173 mg/dL (ref 0–200)
HDL: 75.8 mg/dL (ref >39.00)
LDL Cholesterol: 75 mg/dL (ref 0–99)
NONHDL: 97.33
Triglycerides: 113 mg/dL (ref 0.0–149.0)
VLDL: 22.6 mg/dL (ref 0.0–40.0)

## 2016-06-22 LAB — COMPREHENSIVE METABOLIC PANEL
ALT: 12 U/L (ref 0–35)
AST: 14 U/L (ref 0–37)
Albumin: 3.8 g/dL (ref 3.5–5.2)
Alkaline Phosphatase: 55 U/L (ref 39–117)
BUN: 15 mg/dL (ref 6–23)
CALCIUM: 9.4 mg/dL (ref 8.4–10.5)
CHLORIDE: 109 meq/L (ref 96–112)
CO2: 30 meq/L (ref 19–32)
CREATININE: 0.76 mg/dL (ref 0.40–1.20)
GFR: 82.4 mL/min (ref >60.00)
Glucose, Bld: 93 mg/dL (ref 70–99)
POTASSIUM: 4.7 meq/L (ref 3.5–5.1)
SODIUM: 143 meq/L (ref 135–145)
Total Bilirubin: 0.5 mg/dL (ref 0.2–1.2)
Total Protein: 6.4 g/dL (ref 6.0–8.3)

## 2016-06-22 LAB — TSH: TSH: 1.05 u[IU]/mL (ref 0.35–4.50)

## 2016-06-22 LAB — HEMOGLOBIN A1C: HEMOGLOBIN A1C: 6 % (ref 4.6–6.5)

## 2016-06-22 NOTE — Progress Notes (Signed)
Pre visit review using our clinic review tool, if applicable. No additional management support is needed unless otherwise documented below in the visit note. 

## 2016-06-22 NOTE — Progress Notes (Signed)
Office Note 06/22/2016  CC:  Chief Complaint  Patient presents with  . Annual Exam    Pt is fasting.     HPI:  Taylor Leon is a 60 y.o. Black female who is here for annual health maintenance exam. Eye exam in the last year+. Dental preventative UTD.  No acute complaints. Says some mild elevation of glucoses occurred while in [place] for 1 mo, has 130s fasting lately.   Past Medical History  Diagnosis Date  . H/O allergic rhinitis     Dr. Ishmael Holter  . Obesity     At one point in time she was going to Bariatric clinic on [address] and got weekly HCG injections, monthly vit B12 injections, and took phentermine daily.  As of 05/2015 she was no longer doing this.  . History of pneumonia 2009 and 2010  . Osteoarthritis of both knees     No improvement with steroid injections; did synvisc x 3 yrs; bilat replacement was recommended but she declined.  . Cyst, breast 01/2012; 06/2012; 0000000; 123456    Complicated cysts in upper outer left breast--no evidence of malignancy--f/u bilat diag mammo/left breast u/s on 03/06/15 showed resolution of left breast cysts.  Repeat screening mammogram 1 yr.  . Recurrent low back pain   . Mild persistent asthma, well controlled 2014    New adult onset asthma after respiratory infection (Dr. Ishmael Holter)  . BPPV (benign paroxysmal positional vertigo) 01/30/2014  . Prediabetes 2016    05/2015 A1c was 6.3%    Past Surgical History  Procedure Laterality Date  . Cholecystectomy  1986  . Appendectomy  1978  . Tonsillectomy and adenoidectomy  1967ish  . Lumbar disc surgery  1993    L5/S1  . Cesarean section      x 3  . Total abdominal hysterectomy  1994    Ovaries are still in.  This was done for DUB/fibroids.  . Colonoscopy  2008    normal per pt report (in [place])  . Cardiovascular stress test  2010    abnl per pt; f/u cath clean  . Cardiac catheterization  2010    Normal coronaries per pt    Family History  Problem Relation Age of Onset  . Arthritis  Mother   . Heart disease Mother 8  . Hypertension Mother   . Arthritis Paternal Grandmother     Social History   Social History  . Marital Status: Married    Spouse Name: N/A  . Number of Children: N/A  . Years of Education: N/A   Occupational History  . Not on file.   Social History Main Topics  . Smoking status: Former Smoker    Types: Cigarettes    Quit date: 12/28/2009  . Smokeless tobacco: Never Used  . Alcohol Use: Yes     Comment: social  . Drug Use: No  . Sexual Activity: Not on file   Other Topics Concern  . Not on file   Social History Narrative   Married, 4 grown children.   Relocated to [place], [place] from [place] about 2012.   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   Exercise: intermittently does water aerobics..                Outpatient Prescriptions Prior to Visit  Medication Sig Dispense Refill  . albuterol (PROVENTIL) (2.5 MG/3ML) 0.083% nebulizer solution Take 3 mLs (2.5 mg total) by nebulization every  6 (six) hours as needed for wheezing. 75 mL 0  . Cholecalciferol (VITAMIN D-3 PO) Take 1 tablet by mouth daily.    . metFORMIN (GLUCOPHAGE) 1000 MG tablet Take 1 tablet (1,000 mg total) by mouth 2 (two) times daily with a meal. 180 tablet 1  . Multiple Vitamin (MULTIVITAMIN) capsule Take 1 capsule by mouth daily.    . ONE TOUCH ULTRA TEST test strip 1 strip as needed.  11  . albuterol (VENTOLIN HFA) 108 (90 BASE) MCG/ACT inhaler Inhale 2 puffs into the lungs every 4 (four) hours as needed for wheezing. 1 Inhaler 0   No facility-administered medications prior to visit.    Allergies  Allergen Reactions  . Penicillins Nausea Only and Rash    ROS Review of Systems  Constitutional: Negative for fever, chills, appetite change and fatigue.  HENT: Negative for congestion, dental problem, ear pain and sore throat.   Eyes: Negative for discharge, redness and visual disturbance.   Respiratory: Negative for cough, chest tightness, shortness of breath and wheezing.   Cardiovascular: Negative for chest pain, palpitations and leg swelling.  Gastrointestinal: Negative for nausea, vomiting, abdominal pain, diarrhea and blood in stool.  Genitourinary: Negative for dysuria, urgency, frequency, hematuria, flank pain and difficulty urinating.  Musculoskeletal: Positive for arthralgias (chronic knee pains). Negative for myalgias, back pain, joint swelling and neck stiffness.  Skin: Negative for pallor and rash.  Neurological: Negative for dizziness, speech difficulty, weakness and headaches.  Hematological: Negative for adenopathy. Does not bruise/bleed easily.  Psychiatric/Behavioral: Negative for confusion and sleep disturbance. The patient is not nervous/anxious.     PE; Blood pressure 108/66, pulse 62, temperature 98.3 F (36.8 C), temperature source Oral, resp. rate 16, height 5\' 7"  (1.702 m), weight 323 lb 4 oz (146.625 kg), SpO2 95 %.  Pt examined with Etheleen Sia, nurse, as chaperone.  Gen: Alert, well appearing.  Patient is oriented to person, place, time, and situation. AFFECT: pleasant, lucid thought and speech. ENT: Ears: EACs clear, normal epithelium.  TMs with good light reflex and landmarks bilaterally.  Eyes: no injection, icteris, swelling, or exudate.  EOMI, PERRLA. Nose: no drainage or turbinate edema/swelling.  No injection or focal lesion.  Mouth: lips without lesion/swelling.  Oral mucosa pink and moist.  Dentition intact and without obvious caries or gingival swelling.  Oropharynx without erythema, exudate, or swelling.  Neck: supple/nontender.  No LAD, mass, or TM.  Carotid pulses 2+ bilaterally, without bruits. CV: RRR, no m/r/g.   LUNGS: CTA bilat, nonlabored resps, good aeration in all lung fields. ABD: soft, NT, ND, BS normal.  No hepatospenomegaly or mass.  No bruits. EXT: no clubbing or cyanosis.  Trace to 1+ pitting edema bilat LE's.  Some  pretibial hemosiderin pigmentation is noted bilat. Musculoskeletal: no joint swelling, erythema, warmth, or tenderness.  ROM of all joints intact. Skin - no sores or suspicious lesions or rashes or color changes   Pertinent labs:  Lab Results  Component Value Date   TSH 1.33 06/05/2015   Lab Results  Component Value Date   WBC 10.1 06/05/2015   HGB 14.2 06/05/2015   HCT 43.6 06/05/2015   MCV 84.7 06/05/2015   PLT 282.0 06/05/2015   Lab Results  Component Value Date   CREATININE 0.88 06/05/2015   BUN 15 06/05/2015   NA 141 06/05/2015   K 4.8 06/05/2015   CL 107 06/05/2015   CO2 28 06/05/2015   Lab Results  Component Value Date   ALT 16 06/05/2015  AST 21 06/05/2015   ALKPHOS 76 06/05/2015   BILITOT 0.6 06/05/2015   Lab Results  Component Value Date   CHOL 194 06/05/2015   Lab Results  Component Value Date   HDL 71.40 06/05/2015   Lab Results  Component Value Date   LDLCALC 90 06/05/2015   Lab Results  Component Value Date   TRIG 165.0* 06/05/2015   Lab Results  Component Value Date   CHOLHDL 3 06/05/2015   Lab Results  Component Value Date   HGBA1C 5.8 03/22/2016    ASSESSMENT AND PLAN:   Health maintenance exam: Reviewed age and gender appropriate health maintenance issues (prudent diet, regular exercise, health risks of tobacco and excessive alcohol, use of seatbelts, fire alarms in home, use of sunscreen).  Also reviewed age and gender appropriate health screening as well as vaccine recommendations. She declined zostavax. She'll call her insurer to inquire about coverage for Hep C before she gets screening. Fasting HP labs + HbA1c drawn today. Colon ca screening: due for repeat colonoscopy next year. Breast ca screening: UTD on mammo. Cerv ca screening: does not need paps due to hx of hysterectomy for benign reason + no hx of abnormal paps.  An After Visit Summary was printed and given to the patient.  FOLLOW UP:  Return in about 6 months  (around 12/22/2016) for routine chronic illness f/u.  Signed:  Crissie Sickles, MD           06/22/2016

## 2016-08-02 ENCOUNTER — Other Ambulatory Visit: Payer: Self-pay | Admitting: Family Medicine

## 2016-09-12 ENCOUNTER — Other Ambulatory Visit: Payer: Self-pay | Admitting: Family Medicine

## 2016-09-12 MED FILL — ONE TOUCH DELICA 33G LANCET: 31 days supply | Qty: 100 | Fill #0

## 2016-09-12 MED FILL — ONE TOUCH ULTRA TEST STRIPS: 25 days supply | Qty: 50 | Fill #0

## 2016-09-13 ENCOUNTER — Telehealth: Payer: Self-pay

## 2016-09-13 NOTE — Telephone Encounter (Signed)
Pharmacist notified of instructions per Dr.McGowen.

## 2016-09-13 NOTE — Telephone Encounter (Signed)
Taylor Leon from [facility] called stating that the test strips could not be filled for #100 unless directions are written to be tested 2-4 times q day. Please advise.

## 2016-09-13 NOTE — Telephone Encounter (Signed)
She only needs to be checking her glucose once per day, so they can dispense her the amount appropriate for those instuctions.-thx

## 2016-11-02 MED FILL — ONE TOUCH ULTRA TEST STRIPS: 25 days supply | Qty: 50 | Fill #1

## 2016-12-06 ENCOUNTER — Encounter: Payer: Self-pay | Admitting: Family Medicine

## 2016-12-06 ENCOUNTER — Ambulatory Visit (INDEPENDENT_AMBULATORY_CARE_PROVIDER_SITE_OTHER): Payer: 59 | Admitting: Family Medicine

## 2016-12-06 VITALS — BP 103/65 | HR 64 | Temp 98.3°F | Resp 16 | Ht 67.0 in | Wt 323.5 lb

## 2016-12-06 DIAGNOSIS — R7303 Prediabetes: Secondary | ICD-10-CM | POA: Diagnosis not present

## 2016-12-06 DIAGNOSIS — Z23 Encounter for immunization: Secondary | ICD-10-CM | POA: Diagnosis not present

## 2016-12-06 LAB — POCT GLYCOSYLATED HEMOGLOBIN (HGB A1C): HEMOGLOBIN A1C: 5.6

## 2016-12-06 MED ORDER — METFORMIN HCL 1000 MG PO TABS: ORAL_TABLET | ORAL | 1 refills | Status: DC

## 2016-12-06 MED ORDER — ALBUTEROL SULFATE HFA 108 (90 BASE) MCG/ACT IN AERS: 2.0000 | INHALATION_SPRAY | RESPIRATORY_TRACT | 1 refills | Status: DC | PRN

## 2016-12-06 MED ORDER — ALBUTEROL SULFATE (2.5 MG/3ML) 0.083% IN NEBU: 2.5000 mg | INHALATION_SOLUTION | Freq: Four times a day (QID) | RESPIRATORY_TRACT | 1 refills | Status: DC | PRN

## 2016-12-06 MED ORDER — BECLOMETHASONE DIPROPIONATE 80 MCG/ACT IN AERS: 2.0000 | INHALATION_SPRAY | Freq: Two times a day (BID) | RESPIRATORY_TRACT | 3 refills | Status: DC | PRN

## 2016-12-06 MED FILL — VENTOLIN HFA 90 MCG INHALER: 108 (90 BAS | 30 days supply | Qty: 18 | Fill #0

## 2016-12-06 MED FILL — ONE TOUCH ULTRA TEST STRIPS: 25 days supply | Qty: 50 | Fill #2

## 2016-12-06 MED FILL — ALBUTEROL 0.083% INHAL SOLN: (2.5 MG/3ML | 7 days supply | Qty: 75 | Fill #0

## 2016-12-06 MED FILL — ONE TOUCH DELICA 33G LANCET: 31 days supply | Qty: 100 | Fill #1

## 2016-12-06 NOTE — Progress Notes (Signed)
Pre visit review using our clinic review tool, if applicable. No additional management support is needed unless otherwise documented below in the visit note. 

## 2016-12-06 NOTE — Progress Notes (Signed)
OFFICE VISIT  12/06/2016   CC:  Chief Complaint  Patient presents with  . Follow-up    Pt is fasting.    HPI:    Patient is a 60 y.o. African-American female who presents for 6 mo f/u prediabetes and obesity. Her asthma MD has moved away so she requests RFs of QVAR.  Currently denies any wheezing or SOB.   She takes 2500 mg of metformin per day lately. Fasting glucose range 117-140, avg 120s.   She is exercising: water aerobics. Diet: tries to eat more green veggies, limits carbs some.  Past Medical History:  Diagnosis Date  . BPPV (benign paroxysmal positional vertigo) 01/30/2014  . Cyst, breast 01/2012; 06/2012; 0000000; 123456   Complicated cysts in upper outer left breast--no evidence of malignancy--f/u bilat diag mammo/left breast u/s on 03/06/15 showed resolution of left breast cysts.  Repeat screening mammogram 1 yr.  . H/O allergic rhinitis    Dr. Ishmael Holter  . History of pneumonia 2009 and 2010  . Mild persistent asthma, well controlled 2014   New adult onset asthma after respiratory infection (Dr. Ishmael Holter)  . Obesity    At one point in time she was going to Bariatric clinic on [address] and got weekly HCG injections, monthly vit B12 injections, and took phentermine daily.  As of 05/2015 she was no longer doing this.  . Osteoarthritis of both knees    No improvement with steroid injections; did synvisc x 3 yrs; bilat replacement was recommended but she declined.  . Prediabetes 2016   05/2015 A1c was 6.3%  . Recurrent low back pain     Past Surgical History:  Procedure Laterality Date  . APPENDECTOMY  1978  . CARDIAC CATHETERIZATION  2010   Normal coronaries per pt  . CARDIOVASCULAR STRESS TEST  2010   abnl per pt; f/u cath clean  . CESAREAN SECTION     x 3  . CHOLECYSTECTOMY  1986  . COLONOSCOPY  2008   normal per pt report (in [place])  . LUMBAR DISC SURGERY  1993   L5/S1  . TONSILLECTOMY AND ADENOIDECTOMY  1967ish  . TOTAL ABDOMINAL HYSTERECTOMY  1994   Ovaries  are still in.  This was done for DUB/fibroids.    Outpatient Medications Prior to Visit  Medication Sig Dispense Refill  . Cholecalciferol (VITAMIN D-3 PO) Take 1 tablet by mouth daily.    . Multiple Vitamin (MULTIVITAMIN) capsule Take 1 capsule by mouth daily.    . ONE TOUCH ULTRA TEST test strip USE TO CHECK BLOOD SUGAR 2 TIMES A DAY AS NEEDED 100 each 11  . ONETOUCH DELICA LANCETS 99991111 MISC USE TO CHECK BLOOD SUGAR 2 TIMES A DAY AS NEEDED 100 each 11  . albuterol (PROVENTIL) (2.5 MG/3ML) 0.083% nebulizer solution Take 3 mLs (2.5 mg total) by nebulization every 6 (six) hours as needed for wheezing. 75 mL 0  . metFORMIN (GLUCOPHAGE) 1000 MG tablet Take 1 tablet by mouth two  times daily with a meal (Patient taking differently: Take 1 tablet every morning, 1 tablet every evening and half tablet at bedtime) 180 tablet 1  . albuterol (VENTOLIN HFA) 108 (90 BASE) MCG/ACT inhaler Inhale 2 puffs into the lungs every 4 (four) hours as needed for wheezing. 1 Inhaler 0   No facility-administered medications prior to visit.     Allergies  Allergen Reactions  . Penicillins Nausea Only and Rash    ROS As per HPI  PE: Blood pressure 103/65, pulse 64, temperature  98.3 F (36.8 C), temperature source Oral, resp. rate 16, height 5\' 7"  (1.702 m), weight (!) 323 lb 8 oz (146.7 kg), SpO2 96 %. Body mass index is 50.67 kg/m.  Gen: Alert, well appearing.  Patient is oriented to person, place, time, and situation. AFFECT: pleasant, lucid thought and speech. No further exam today.  LABS:  Lab Results  Component Value Date   TSH 1.05 06/22/2016   Lab Results  Component Value Date   WBC 8.8 06/22/2016   HGB 13.0 06/22/2016   HCT 40.2 06/22/2016   MCV 85.5 06/22/2016   PLT 281.0 06/22/2016   Lab Results  Component Value Date   CREATININE 0.76 06/22/2016   BUN 15 06/22/2016   NA 143 06/22/2016   K 4.7 06/22/2016   CL 109 06/22/2016   CO2 30 06/22/2016   Lab Results  Component Value Date    ALT 12 06/22/2016   AST 14 06/22/2016   ALKPHOS 55 06/22/2016   BILITOT 0.5 06/22/2016   Lab Results  Component Value Date   CHOL 173 06/22/2016   Lab Results  Component Value Date   HDL 75.80 06/22/2016   Lab Results  Component Value Date   LDLCALC 75 06/22/2016   Lab Results  Component Value Date   TRIG 113.0 06/22/2016   Lab Results  Component Value Date   CHOLHDL 2 06/22/2016   Lab Results  Component Value Date   HGBA1C 5.6 12/06/2016   POCT HbA1c 5.6% today.  IMPRESSION AND PLAN:  1) Prediabetes: doing fine. Cautioned pt to only take 1000 mg metformin bid---don't add extra 1/2 tab hs. Continue to work on diet and exercise. Flu vaccine today.  2) Asthma: her asthma MD has moved away so I have RF'd her QVAR and albuterol today.  An After Visit Summary was printed and given to the patient.  FOLLOW UP: Return in about 6 months (around 06/06/2017) for annual CPE (fasting).  Signed:  Crissie Sickles, MD           12/06/2016

## 2016-12-14 ENCOUNTER — Ambulatory Visit: Payer: 59 | Admitting: Family Medicine

## 2017-01-19 MED FILL — QVAR 80 MCG ORAL INHALER: 80 | 30 days supply | Qty: 9 | Fill #0

## 2017-01-22 ENCOUNTER — Other Ambulatory Visit: Payer: Self-pay | Admitting: Family Medicine

## 2017-01-27 ENCOUNTER — Other Ambulatory Visit: Payer: Self-pay | Admitting: Family Medicine

## 2017-01-27 ENCOUNTER — Ambulatory Visit (INDEPENDENT_AMBULATORY_CARE_PROVIDER_SITE_OTHER): Payer: BLUE CROSS/BLUE SHIELD

## 2017-01-27 ENCOUNTER — Ambulatory Visit: Payer: Self-pay

## 2017-01-27 DIAGNOSIS — Z111 Encounter for screening for respiratory tuberculosis: Secondary | ICD-10-CM

## 2017-01-27 NOTE — Progress Notes (Signed)
Pre visit review using our clinic tool,if applicable. No additional management support is needed unless otherwise documented below in the visit note.   Patient in for PPD for work. Given left upper arm. Patient to return by 10:40 am on Monday February 5th for read.

## 2017-01-30 LAB — TB SKIN TEST
INDURATION: 0 mm
TB SKIN TEST: NEGATIVE

## 2017-02-27 ENCOUNTER — Other Ambulatory Visit: Payer: Self-pay | Admitting: Family Medicine

## 2017-02-27 MED FILL — metFORMIN HCL 1000 MG TABS: 1000 | 30 days supply | Qty: 60 | Fill #0

## 2017-03-21 ENCOUNTER — Other Ambulatory Visit: Payer: Self-pay | Admitting: *Deleted

## 2017-03-21 MED ORDER — BLOOD GLUCOSE MONITOR KIT: PACK | 0 refills | Status: DC

## 2017-03-21 MED FILL — CONTOUR NEXT STRIPS: 50 days supply | Qty: 100 | Fill #0

## 2017-03-21 MED FILL — MICROLET LANCETS: 50 days supply | Qty: 100 | Fill #0

## 2017-03-21 MED FILL — CONTOUR NEXT METER: W/DEVICE | 30 days supply | Qty: 1 | Fill #0

## 2017-03-21 NOTE — Telephone Encounter (Signed)
Fax from [facility] requesting DM supplies. Pts insurance no longer covers [employer] products but will cover [place] next. New Rx for meter, lancets and test strips faxed to pharmacy. Dx: R73.03

## 2017-04-05 MED FILL — metFORMIN HCL 1000 MG TABS: 1000 | 30 days supply | Qty: 60 | Fill #1

## 2017-05-04 ENCOUNTER — Encounter (HOSPITAL_BASED_OUTPATIENT_CLINIC_OR_DEPARTMENT_OTHER): Payer: Self-pay | Admitting: *Deleted

## 2017-05-04 ENCOUNTER — Emergency Department (HOSPITAL_BASED_OUTPATIENT_CLINIC_OR_DEPARTMENT_OTHER): Payer: Worker's Compensation

## 2017-05-04 ENCOUNTER — Emergency Department (HOSPITAL_BASED_OUTPATIENT_CLINIC_OR_DEPARTMENT_OTHER)
Admission: EM | Admit: 2017-05-04 | Discharge: 2017-05-04 | Disposition: A | Discharge status: Home / Self Care | Payer: Worker's Compensation | Attending: Physician Assistant | Admitting: Physician Assistant

## 2017-05-04 DIAGNOSIS — Z7951 Long term (current) use of inhaled steroids: Secondary | ICD-10-CM | POA: Diagnosis not present

## 2017-05-04 DIAGNOSIS — W010XXA Fall on same level from slipping, tripping and stumbling without subsequent striking against object, initial encounter: Secondary | ICD-10-CM | POA: Diagnosis not present

## 2017-05-04 DIAGNOSIS — Z7984 Long term (current) use of oral hypoglycemic drugs: Secondary | ICD-10-CM | POA: Diagnosis not present

## 2017-05-04 DIAGNOSIS — Y929 Unspecified place or not applicable: Secondary | ICD-10-CM | POA: Diagnosis not present

## 2017-05-04 DIAGNOSIS — R7303 Prediabetes: Secondary | ICD-10-CM | POA: Insufficient documentation

## 2017-05-04 DIAGNOSIS — S0081XA Abrasion of other part of head, initial encounter: Secondary | ICD-10-CM | POA: Diagnosis not present

## 2017-05-04 DIAGNOSIS — S5002XA Contusion of left elbow, initial encounter: Secondary | ICD-10-CM

## 2017-05-04 DIAGNOSIS — Z87891 Personal history of nicotine dependence: Secondary | ICD-10-CM | POA: Insufficient documentation

## 2017-05-04 DIAGNOSIS — Y998 Other external cause status: Secondary | ICD-10-CM | POA: Insufficient documentation

## 2017-05-04 DIAGNOSIS — Y9389 Activity, other specified: Secondary | ICD-10-CM | POA: Insufficient documentation

## 2017-05-04 DIAGNOSIS — J4530 Mild persistent asthma, uncomplicated: Secondary | ICD-10-CM | POA: Diagnosis not present

## 2017-05-04 DIAGNOSIS — S59902A Unspecified injury of left elbow, initial encounter: Secondary | ICD-10-CM | POA: Diagnosis present

## 2017-05-04 NOTE — ED Triage Notes (Signed)
Pt reports she was ducking to miss some birds that were flying over her head and slipped and fell, pt did hit her head, hematoma noted left hairline area, tender to touch, denies loc, also reports left arm pain, braced fall with left arm.

## 2017-05-04 NOTE — ED Notes (Signed)
ED Provider at bedside. 

## 2017-05-04 NOTE — ED Notes (Signed)
Concerned about wait time. Pt only been in room 20 minutes.

## 2017-05-04 NOTE — Discharge Instructions (Signed)
Ice, rest, elevate. Only use immbolizer for comfort. Return with numbnes weakness or other concerns.

## 2017-05-04 NOTE — ED Notes (Signed)
Patient transported to X-ray 

## 2017-05-04 NOTE — ED Notes (Signed)
Work paperwork filled out for pt by EDP. Family at bedside

## 2017-05-04 NOTE — ED Provider Notes (Signed)
[facility] DEPT [facility] Provider Note   CSN: [id number] Arrival date & time: 05/04/17  1029     History   Chief Complaint Chief Complaint  Patient presents with  . Fall    HPI Caitlynn Ju is a 61 y.o. female.  HPI   Patient is a 61 year old female presenting with fall. Patient fell onto her left elbow. Patient was ducking to avoid some birds and stumbled down sustaining injury to her left forehead and left elbow. Patient has small abrasion to left forehead. No loss of consciousness or dizziness no headache. Patient went to work [employer]. But he continued to be painful. She'll she had some numbness to her right middle 2 smallest finger. This is since resolved.  Past Medical History:  Diagnosis Date  . BPPV (benign paroxysmal positional vertigo) 01/30/2014  . Cyst, breast 01/2012; 06/2012; 08/30/61; 12/28/06   Complicated cysts in upper outer left breast--no evidence of malignancy--f/u bilat diag mammo/left breast u/s on 03/06/15 showed resolution of left breast cysts.  Repeat screening mammogram 1 yr.  . H/O allergic rhinitis    Dr. Ishmael Holter  . History of pneumonia 2009 and 2010  . Mild persistent asthma, well controlled 2014   New adult onset asthma after respiratory infection (Dr. Ishmael Holter)  . Obesity    At one point in time she was going to Bariatric clinic on [address] and got weekly HCG injections, monthly vit B12 injections, and took phentermine daily.  As of 05/2015 she was no longer doing this.  . Osteoarthritis of both knees    No improvement with steroid injections; did synvisc x 3 yrs; bilat replacement was recommended but she declined.  . Prediabetes 2016   05/2015 A1c was 6.3%  . Recurrent low back pain     Patient Active Problem List   Diagnosis Date Noted  . Prediabetes 12/26/2014  . Health maintenance examination 01/30/2014  . BPPV (benign paroxysmal positional vertigo) 01/30/2014  . Allergic rhinitis 08/29/2013  . DJD (degenerative joint disease) of knee 09/10/2012    . Asthmatic bronchitis 03/28/2012    Past Surgical History:  Procedure Laterality Date  . APPENDECTOMY  1978  . CARDIAC CATHETERIZATION  2010   Normal coronaries per pt  . CARDIOVASCULAR STRESS TEST  2010   abnl per pt; f/u cath clean  . CESAREAN SECTION     x 3  . CHOLECYSTECTOMY  1986  . COLONOSCOPY  2008   normal per pt report (in [place])  . LUMBAR DISC SURGERY  1993   L5/S1  . TONSILLECTOMY AND ADENOIDECTOMY  1967ish  . TOTAL ABDOMINAL HYSTERECTOMY  1994   Ovaries are still in.  This was done for DUB/fibroids.    OB History    No data available       Home Medications    Prior to Admission medications   Medication Sig Start Date End Date Taking? Authorizing Provider  albuterol (PROVENTIL) (2.5 MG/3ML) 0.083% nebulizer solution Take 3 mLs (2.5 mg total) by nebulization every 6 (six) hours as needed for wheezing. 12/06/16   McGowen, Adrian Blackwater, MD  albuterol (VENTOLIN HFA) 108 (90 Base) MCG/ACT inhaler Inhale 2 puffs into the lungs every 4 (four) hours as needed for wheezing. 12/06/16 02/10/19  McGowen, Adrian Blackwater, MD  beclomethasone (QVAR) 80 MCG/ACT inhaler Inhale 2 puffs into the lungs 2 (two) times daily as needed. 12/06/16   McGowen, Adrian Blackwater, MD  blood glucose meter kit and supplies KIT Use to check blood sugars two times daily as needed  03/21/17   McGowen, Adrian Blackwater, MD  Cholecalciferol (VITAMIN D-3 PO) Take 1 tablet by mouth daily.    [provider]  metFORMIN (GLUCOPHAGE) 1000 MG tablet TAKE 1 TABLET BY MOUTH TWO  TIMES DAILY WITH A MEAL 01/23/17   McGowen, Adrian Blackwater, MD  metFORMIN (GLUCOPHAGE) 1000 MG tablet TAKE 1 TABLET (1,000 MG TOTAL) BY MOUTH 2 (TWO) TIMES DAILY WITH A MEAL. 02/27/17   McGowen, Adrian Blackwater, MD  Multiple Vitamin (MULTIVITAMIN) capsule Take 1 capsule by mouth daily.    [provider]    Family History Family History  Problem Relation Age of Onset  . Arthritis Mother   . Heart disease Mother 33  . Hypertension Mother   . Arthritis  Paternal Grandmother     Social History Social History  Substance Use Topics  . Smoking status: Former Smoker    Types: Cigarettes    Quit date: 12/28/2009  . Smokeless tobacco: Never Used  . Alcohol use Yes     Comment: social     Allergies   Penicillins   Review of Systems Review of Systems  Constitutional: Negative for activity change.  Respiratory: Negative for shortness of breath.   Cardiovascular: Negative for chest pain.  Gastrointestinal: Negative for abdominal pain.     Physical Exam Updated Vital Signs There were no vitals taken for this visit.  Physical Exam  Constitutional: She is oriented to person, place, and time. She appears well-developed and well-nourished.  HENT:  Head: Normocephalic and atraumatic.  Eyes: Right eye exhibits no discharge. Left eye exhibits no discharge.  Cardiovascular: Normal rate and regular rhythm.   Pulmonary/Chest: Effort normal and breath sounds normal.  Abdominal: Soft.  Musculoskeletal:  L elbow pain and swelling. L distal extremity normal. Wrist and fingers normal strength. Normal shoulder joint.  Elbow has some warmth and swelling.  Neurological: She is oriented to person, place, and time.  Skin: Skin is warm and dry. She is not diaphoretic.  Psychiatric: She has a normal mood and affect.  Nursing note and vitals reviewed.    ED Treatments / Results  Labs (all labs ordered are listed, but only abnormal results are displayed) Labs Reviewed - No data to display  EKG  EKG Interpretation None       Radiology Dg Elbow Complete Left  Result Date: 05/04/2017 CLINICAL DATA:  [place] today and injured left elbow. EXAM: LEFT ELBOW - COMPLETE 3+ VIEW COMPARISON:  None. FINDINGS: Mild elbow joint degenerative changes with spurring along the coronoid process. No acute elbow fracture or elbow joint effusion. No osteochondral abnormality. Soft tissue swelling/ hematoma noted along the ulnar aspect of the elbow. IMPRESSION:  Degenerative changes but no acute fracture or joint effusion. Electronically Signed   By: Marijo Sanes M.D.   On: 05/04/2017 11:36    Procedures Procedures (including critical care time)  Medications Ordered in ED Medications - No data to display   Initial Impression / Assessment and Plan / ED Course  I have reviewed the triage vital signs and the nursing notes.  Pertinent labs & imaging results that were available during my care of the patient were reviewed by me and considered in my medical decision making (see chart for details).     Patient is a 61 year old presenting with fall. Patient has injury to left elbow. Patient up-to-date on tetanus. We'll get x-ray left elbow.  Xray negative for fracture. Hematoma consistent with xray and exam.  Currently has good strength. She will ice, follow upw  with PCP. Return with weakness, or numbness or other symtpoms.   Final Clinical Impressions(s) / ED Diagnoses   Final diagnoses:  None    New Prescriptions New Prescriptions   No medications on file     Macarthur Critchley, MD 05/04/17 1232

## 2017-05-08 MED FILL — metFORMIN HCL 1000 MG TABS: 1000 | 30 days supply | Qty: 60 | Fill #2

## 2017-05-23 DIAGNOSIS — Z1231 Encounter for screening mammogram for malignant neoplasm of breast: Secondary | ICD-10-CM | POA: Diagnosis not present

## 2017-05-23 LAB — HM MAMMOGRAPHY

## 2017-05-24 ENCOUNTER — Encounter: Payer: Self-pay | Admitting: Family Medicine

## 2017-06-06 MED FILL — metFORMIN HCL 1000 MG TABS: 1000 | 30 days supply | Qty: 60 | Fill #3

## 2017-06-07 ENCOUNTER — Encounter: Payer: 59 | Admitting: Family Medicine

## 2017-06-20 ENCOUNTER — Encounter: Payer: Self-pay | Admitting: Gastroenterology

## 2017-06-20 ENCOUNTER — Ambulatory Visit (INDEPENDENT_AMBULATORY_CARE_PROVIDER_SITE_OTHER): Payer: BLUE CROSS/BLUE SHIELD | Admitting: Family Medicine

## 2017-06-20 ENCOUNTER — Encounter: Payer: Self-pay | Admitting: Family Medicine

## 2017-06-20 VITALS — BP 103/69 | HR 66 | Temp 98.2°F | Resp 16 | Ht 67.0 in | Wt 328.2 lb

## 2017-06-20 DIAGNOSIS — Z1159 Encounter for screening for other viral diseases: Secondary | ICD-10-CM

## 2017-06-20 DIAGNOSIS — R7303 Prediabetes: Secondary | ICD-10-CM

## 2017-06-20 DIAGNOSIS — Z9189 Other specified personal risk factors, not elsewhere classified: Secondary | ICD-10-CM

## 2017-06-20 DIAGNOSIS — Z0000 Encounter for general adult medical examination without abnormal findings: Secondary | ICD-10-CM | POA: Diagnosis not present

## 2017-06-20 DIAGNOSIS — Z1211 Encounter for screening for malignant neoplasm of colon: Secondary | ICD-10-CM | POA: Diagnosis not present

## 2017-06-20 DIAGNOSIS — Z23 Encounter for immunization: Secondary | ICD-10-CM | POA: Diagnosis not present

## 2017-06-20 LAB — CBC WITH DIFFERENTIAL/PLATELET
BASOS ABS: 0 10*3/uL (ref 0.0–0.1)
BASOS PCT: 0.3 % (ref 0.0–3.0)
EOS PCT: 3.5 % (ref 0.0–5.0)
Eosinophils Absolute: 0.3 10*3/uL (ref 0.0–0.7)
HCT: 41.1 % (ref 36.0–46.0)
Hemoglobin: 13.4 g/dL (ref 12.0–15.0)
LYMPHS ABS: 2.4 10*3/uL (ref 0.7–4.0)
Lymphocytes Relative: 29.2 % (ref 12.0–46.0)
MCHC: 32.5 g/dL (ref 30.0–36.0)
MCV: 84.5 fl (ref 78.0–100.0)
MONOS PCT: 6.4 % (ref 3.0–12.0)
Monocytes Absolute: 0.5 10*3/uL (ref 0.1–1.0)
NEUTROS ABS: 5.1 10*3/uL (ref 1.4–7.7)
NEUTROS PCT: 60.6 % (ref 43.0–77.0)
PLATELETS: 312 10*3/uL (ref 150.0–400.0)
RBC: 4.87 Mil/uL (ref 3.87–5.11)
RDW: 15 % (ref 11.5–15.5)
WBC: 8.4 10*3/uL (ref 4.0–10.5)

## 2017-06-20 LAB — COMPREHENSIVE METABOLIC PANEL
ALT: 13 U/L (ref 0–35)
AST: 13 U/L (ref 0–37)
Albumin: 4.1 g/dL (ref 3.5–5.2)
Alkaline Phosphatase: 63 U/L (ref 39–117)
BUN: 17 mg/dL (ref 6–23)
CHLORIDE: 106 meq/L (ref 96–112)
CO2: 28 meq/L (ref 19–32)
CREATININE: 0.8 mg/dL (ref 0.40–1.20)
Calcium: 9.7 mg/dL (ref 8.4–10.5)
GFR: 77.41 mL/min (ref >60.00)
Glucose, Bld: 100 mg/dL — ABNORMAL HIGH (ref 70–99)
POTASSIUM: 4.6 meq/L (ref 3.5–5.1)
SODIUM: 141 meq/L (ref 135–145)
Total Bilirubin: 0.5 mg/dL (ref 0.2–1.2)
Total Protein: 6.7 g/dL (ref 6.0–8.3)

## 2017-06-20 LAB — LIPID PANEL
CHOL/HDL RATIO: 2
Cholesterol: 199 mg/dL (ref 0–200)
HDL: 84 mg/dL (ref >39.00)
LDL CALC: 92 mg/dL (ref 0–99)
NONHDL: 115.38
Triglycerides: 116 mg/dL (ref 0.0–149.0)
VLDL: 23.2 mg/dL (ref 0.0–40.0)

## 2017-06-20 LAB — TSH: TSH: 1.09 u[IU]/mL (ref 0.35–4.50)

## 2017-06-20 LAB — HEMOGLOBIN A1C: Hgb A1c MFr Bld: 6.2 % (ref 4.6–6.5)

## 2017-06-20 LAB — HEPATITIS C ANTIBODY: HCV Ab: NEGATIVE

## 2017-06-20 NOTE — Addendum Note (Signed)
Addended by: Onalee Hua on: 06/20/2017 10:59 AM   Modules accepted: Orders

## 2017-06-20 NOTE — Patient Instructions (Signed)

## 2017-06-20 NOTE — Progress Notes (Signed)
Office Note 06/20/2017  CC:  Chief Complaint  Patient presents with  . Annual Exam    Pt is fasting.    HPI:  Taylor Leon is a 61 y.o. female who is annual health maintenance exam with pelvic/pap.  Eyes: last eye exam 2 yrs ago. Dental: last dental preventative 2 yrs ago--had no dental insurance last year.  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  She has been seeing a workers comp MD and her arm was not broken (MRI normal).  She does have residual chronic L arm pain and can't fully extend it. She continues to get injections in her knees by Dr. Ricki Rodriguez due to bilat chronic pain secondary to osteoarthritis. She has declined TKA in the past.  She is planning to go to [place] in 10d for fun/vacation.  Of note, she no longer needs any pap smears b/c she is s/p hysterectomy for benign diagnosis and has not had a history of abnormal pap smears.    Past Medical History:  Diagnosis Date  . BPPV (benign paroxysmal positional vertigo) 01/30/2014  . Cyst, breast 01/2012; 06/2012; 05/29/32; 02/03/50   Complicated cysts in upper outer left breast--no evidence of malignancy--f/u bilat diag mammo/left breast u/s on 03/06/15 showed resolution of left breast cysts.  Repeat screening mammogram 1 yr.  . H/O allergic rhinitis    Dr. Ishmael Holter  . History of pneumonia 2009 and 2010  . Mild persistent asthma, well controlled 2014   New adult onset asthma after respiratory infection (Dr. Ishmael Holter)  . Obesity    At one point in time she was going to Bariatric clinic on [address] and got weekly HCG injections, monthly vit B12 injections, and took phentermine daily.  As of 05/2015 she was no longer doing this.  . Osteoarthritis of both knees    No improvement with steroid injections; did synvisc x 3 yrs; bilat replacement was recommended but she declined.  . Prediabetes 2016   05/2015 A1c was 6.3%  .  Recurrent low back pain     Past Surgical History:  Procedure Laterality Date  . APPENDECTOMY  1978  . CARDIAC CATHETERIZATION  2010   Normal coronaries per pt  . CARDIOVASCULAR STRESS TEST  2010   abnl per pt; f/u cath clean  . CESAREAN SECTION     x 3  . CHOLECYSTECTOMY  1986  . COLONOSCOPY  2008   normal per pt report (in [place])  . LUMBAR DISC SURGERY  1993   L5/S1  . TONSILLECTOMY AND ADENOIDECTOMY  1967ish  . TOTAL ABDOMINAL HYSTERECTOMY  1994   Ovaries are still in.  This was done for DUB/fibroids.    Family History  Problem Relation Age of Onset  . Arthritis Mother   . Heart disease Mother 64  . Hypertension Mother   . Arthritis Paternal Grandmother     Social History   Social History  . Marital status: Married    Spouse name: N/A  . Number of children: N/A  . Years of education: N/A   Occupational History  . Not on file.   Social History Main Topics  . Smoking status: Former Smoker    Types: Cigarettes    Quit date: 12/28/2009  . Smokeless tobacco: Never Used  . Alcohol use Yes     Comment: social  . Drug use: No  . Sexual activity: Not on  file   Other Topics Concern  . Not on file   Social History Narrative   Married, 4 grown children.   Relocated to [place], [place] from [place] about 2012.   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   Exercise: intermittently does water aerobics..                Outpatient Medications Prior to Visit  Medication Sig Dispense Refill  . albuterol (PROVENTIL) (2.5 MG/3ML) 0.083% nebulizer solution Take 3 mLs (2.5 mg total) by nebulization every 6 (six) hours as needed for wheezing. 75 mL 1  . albuterol (VENTOLIN HFA) 108 (90 Base) MCG/ACT inhaler Inhale 2 puffs into the lungs every 4 (four) hours as needed for wheezing. 1 Inhaler 1  . beclomethasone (QVAR) 80 MCG/ACT inhaler Inhale 2 puffs into the lungs 2 (two) times daily as needed. 1 Inhaler 3   . Cholecalciferol (VITAMIN D-3 PO) Take 1 tablet by mouth daily.    . metFORMIN (GLUCOPHAGE) 1000 MG tablet TAKE 1 TABLET (1,000 MG TOTAL) BY MOUTH 2 (TWO) TIMES DAILY WITH A MEAL. 60 tablet 3  . Multiple Vitamin (MULTIVITAMIN) capsule Take 1 capsule by mouth daily.    . blood glucose meter kit and supplies KIT Use to check blood sugars two times daily as needed (Patient not taking: Reported on 06/20/2017) 1 each 0  . metFORMIN (GLUCOPHAGE) 1000 MG tablet TAKE 1 TABLET BY MOUTH TWO  TIMES DAILY WITH A MEAL (Patient not taking: Reported on 06/20/2017) 180 tablet 1   No facility-administered medications prior to visit.     Allergies  Allergen Reactions  . Penicillins Nausea Only and Rash    ROS Review of Systems  Constitutional: Negative for appetite change, chills, fatigue and fever.  HENT: Negative for congestion, dental problem, ear pain and sore throat.   Eyes: Negative for discharge, redness and visual disturbance.  Respiratory: Negative for cough, chest tightness, shortness of breath and wheezing.   Cardiovascular: Negative for chest pain, palpitations and leg swelling.  Gastrointestinal: Negative for abdominal pain, blood in stool, diarrhea, nausea and vomiting.  Genitourinary: Negative for difficulty urinating, dysuria, flank pain, frequency, hematuria and urgency.  Musculoskeletal: Positive for arthralgias (bilat knees--chronic). Negative for back pain, joint swelling, myalgias and neck stiffness.       L arm pain --see HPI   Skin: Negative for pallor and rash.  Neurological: Negative for dizziness, speech difficulty, weakness and headaches.  Hematological: Negative for adenopathy. Does not bruise/bleed easily.  Psychiatric/Behavioral: Negative for confusion and sleep disturbance. The patient is not nervous/anxious.     PE; Blood pressure 103/69, pulse 66, temperature 98.2 F (36.8 C), temperature source Oral, resp. rate 16, height 5\' 7"  (1.702 m), weight (!) 328 lb 4 oz  (148.9 kg), SpO2 95 %.  Pt examined with Sharen Hones, CMA, as chaperone.  Gen: Alert, well appearing.  Patient is oriented to person, place, time, and situation. AFFECT: pleasant, lucid thought and speech. ENT: Ears: EACs clear, normal epithelium.  TMs with good light reflex and landmarks bilaterally.  Eyes: no injection, icteris, swelling, or exudate.  EOMI, PERRLA. Nose: no drainage or turbinate edema/swelling.  No injection or focal lesion.  Mouth: lips without lesion/swelling.  Oral mucosa pink and moist.  Dentition intact and without obvious caries or gingival swelling.  Oropharynx without erythema, exudate, or swelling.  Neck: supple/nontender.  No LAD, mass, or TM.  Carotid pulses 2+ bilaterally, without  bruits. CV: RRR, no m/r/g.   LUNGS: CTA bilat, nonlabored resps, good aeration in all lung fields. ABD: soft, NT, ND, BS normal.  No hepatospenomegaly or mass.  No bruits. EXT: no clubbing, cyanosis, or edema.  Musculoskeletal: no joint swelling, erythema, warmth, or tenderness.  ROM of all joints intact. Skin - no sores or suspicious lesions or rashes or color changes   Pertinent labs:  Lab Results  Component Value Date   TSH 1.05 06/22/2016   Lab Results  Component Value Date   WBC 8.8 06/22/2016   HGB 13.0 06/22/2016   HCT 40.2 06/22/2016   MCV 85.5 06/22/2016   PLT 281.0 06/22/2016   Lab Results  Component Value Date   CREATININE 0.76 06/22/2016   BUN 15 06/22/2016   NA 143 06/22/2016   K 4.7 06/22/2016   CL 109 06/22/2016   CO2 30 06/22/2016   Lab Results  Component Value Date   ALT 12 06/22/2016   AST 14 06/22/2016   ALKPHOS 55 06/22/2016   BILITOT 0.5 06/22/2016   Lab Results  Component Value Date   CHOL 173 06/22/2016   Lab Results  Component Value Date   HDL 75.80 06/22/2016   Lab Results  Component Value Date   LDLCALC 75 06/22/2016   Lab Results  Component Value Date   TRIG 113.0 06/22/2016   Lab Results  Component Value Date    CHOLHDL 2 06/22/2016   Lab Results  Component Value Date   HGBA1C 5.6 12/06/2016   ASSESSMENT AND PLAN:   Health maintenance exam: Reviewed age and gender appropriate health maintenance issues (prudent diet, regular exercise, health risks of tobacco and excessive alcohol, use of seatbelts, fire alarms in home, use of sunscreen).  Also reviewed age and gender appropriate health screening as well as vaccine recommendations. Vaccines: Td UTD.  Discussed shingrix today-#1 given here in office today.  Return in 2-6 mo for #2. Labs: HP labs + HbA1c (hx of prediabetes, currently on metformin). Breast ca screening: last mammogram 05/23/17, normal, repeat after 05/23/18. Cervical ca screening: no longer needed per guidelines--hx of hysterectomy for benign reasons and no hx of abnormal paps. Colon ca screening: per pt report, she had a colonoscopy that was normal in 2008 in [place].  Due for repeat screening colonoscopy--referred to GI today. Osteoporosis screening: she declined DEXA today.  An After Visit Summary was printed and given to the patient.  FOLLOW UP:  Return in about 6 months (around 12/20/2017) for routine chronic illness f/u.  Signed:  Crissie Sickles, MD           06/20/2017

## 2017-06-21 ENCOUNTER — Other Ambulatory Visit: Payer: Self-pay | Admitting: Family Medicine

## 2017-06-21 ENCOUNTER — Other Ambulatory Visit: Payer: Self-pay

## 2017-06-21 MED ORDER — METFORMIN HCL 1000 MG PO TABS: 1000.0000 mg | ORAL_TABLET | Freq: Two times a day (BID) | ORAL | 1 refills | Status: DC

## 2017-06-21 NOTE — Telephone Encounter (Signed)
Metformin refill sent to pharmacy

## 2017-07-10 MED FILL — metFORMIN HCL 1000 MG TABS: 1000 | 90 days supply | Qty: 180 | Fill #0

## 2017-07-10 MED FILL — MICROLET LANCETS: 50 days supply | Qty: 100 | Fill #0

## 2017-07-10 MED FILL — CONTOUR NEXT STRIPS: 50 days supply | Qty: 100 | Fill #0

## 2017-07-13 DIAGNOSIS — M170 Bilateral primary osteoarthritis of knee: Secondary | ICD-10-CM | POA: Diagnosis not present

## 2017-07-21 DIAGNOSIS — M170 Bilateral primary osteoarthritis of knee: Secondary | ICD-10-CM | POA: Diagnosis not present

## 2017-07-28 DIAGNOSIS — M170 Bilateral primary osteoarthritis of knee: Secondary | ICD-10-CM | POA: Diagnosis not present

## 2017-08-02 ENCOUNTER — Telehealth: Payer: Self-pay | Admitting: *Deleted

## 2017-08-02 NOTE — Telephone Encounter (Signed)
I'd like to see her in the office to discuss options first. Thanks

## 2017-08-02 NOTE — Telephone Encounter (Signed)
Dr Havery Moros,  Pt is scheduled for a pre visit 8-17 with a direct colon 8-30 Thursday .  She had a colon 10 yrs ago in [place] .  She has not been seen in our office. She has a BMI of 51.5.  Pt has a history of asthma, DJD, and allergies.  Do you want to see her in the office or can she be a direct at the hospital.  Please advise and thanks for your time,   Marijean Niemann

## 2017-08-02 NOTE — Telephone Encounter (Signed)
Pamala Hurry called pt- explained need for OV-  OV scheduled with pt for 10-03-17 at 330 pm. Instructed pt to check in 3rd floor   [facility]

## 2017-08-24 ENCOUNTER — Encounter: Payer: Self-pay | Admitting: Gastroenterology

## 2017-08-30 ENCOUNTER — Ambulatory Visit (INDEPENDENT_AMBULATORY_CARE_PROVIDER_SITE_OTHER): Payer: BLUE CROSS/BLUE SHIELD

## 2017-08-30 DIAGNOSIS — Z23 Encounter for immunization: Secondary | ICD-10-CM

## 2017-08-30 NOTE — Progress Notes (Signed)
Patient presents today for Shingrix #2 injection. Given with no incidence or problems. Patient left with no complaints.

## 2017-10-03 ENCOUNTER — Encounter: Payer: Self-pay | Admitting: Gastroenterology

## 2017-10-03 ENCOUNTER — Ambulatory Visit (INDEPENDENT_AMBULATORY_CARE_PROVIDER_SITE_OTHER): Payer: BLUE CROSS/BLUE SHIELD | Admitting: Gastroenterology

## 2017-10-03 VITALS — BP 110/72 | HR 80 | Ht 67.0 in | Wt 333.6 lb

## 2017-10-03 DIAGNOSIS — Z1211 Encounter for screening for malignant neoplasm of colon: Secondary | ICD-10-CM | POA: Diagnosis not present

## 2017-10-03 NOTE — Progress Notes (Signed)
HPI :  61 y/o female with a history of morbid obesity (BMI 52), asthma, pre-DM, here for discussion of colon cancer screening given her body mass index.  She had a colonoscopy 2 times in the past, last time she thinks in 2011, although her chart reports her last exam was done in 2008. Done in [place] - she does not think she had any polyps, normal. No FH of colon cancer. No blood in the stools. She denies any bowel habits changes or constipation. No abdominal pains. She is generally doing okay and denies any complaints outside of her intermittent cough thought to be related to mild asthma.   She's had a history of anal fissures in the past, has used fiber supplements in the past. No issues with that recently.   Past Medical History:  Diagnosis Date  . BPPV (benign paroxysmal positional vertigo) 01/30/2014  . Cyst, breast 01/2012; 06/2012; 02/26/18; 03/01/89   Complicated cysts in upper outer left breast--no evidence of malignancy--f/u bilat diag mammo/left breast u/s on 03/06/15 showed resolution of left breast cysts.  Repeat screening mammogram 1 yr.  . H/O allergic rhinitis    Dr. Ishmael Holter  . History of pneumonia 2009 and 2010  . Mild persistent asthma, well controlled 2014   New adult onset asthma after respiratory infection (Dr. Ishmael Holter)  . Obesity    At one point in time she was going to Bariatric clinic on [address] and got weekly HCG injections, monthly vit B12 injections, and took phentermine daily.  As of 05/2015 she was no longer doing this.  . Osteoarthritis of both knees    No improvement with steroid injections; did synvisc x 3 yrs; bilat replacement was recommended but she declined.  . Prediabetes 2016   05/2015 A1c was 6.3%  . Recurrent low back pain      Past Surgical History:  Procedure Laterality Date  . APPENDECTOMY  1978  . CARDIAC CATHETERIZATION  2010   Normal coronaries per pt  . CARDIOVASCULAR STRESS TEST  2010   abnl per pt; f/u cath clean  . CESAREAN SECTION      x 3  . CHOLECYSTECTOMY  1986  . COLONOSCOPY  2008   normal per pt report (in [place])  . LUMBAR DISC SURGERY  1993   L5/S1  . TONSILLECTOMY AND ADENOIDECTOMY  1967ish  . TOTAL ABDOMINAL HYSTERECTOMY  1994   Ovaries are still in.  This was done for DUB/fibroids.   Family History  Problem Relation Age of Onset  . Arthritis Mother   . Heart disease Mother 41  . Hypertension Mother   . Diabetes Mother   . Arthritis Paternal Grandmother    Social History  Substance Use Topics  . Smoking status: Former Smoker    Types: Cigarettes    Quit date: 12/28/2009  . Smokeless tobacco: Never Used  . Alcohol use Yes     Comment: social   Current Outpatient Prescriptions  Medication Sig Dispense Refill  . albuterol (PROVENTIL) (2.5 MG/3ML) 0.083% nebulizer solution Take 3 mLs (2.5 mg total) by nebulization every 6 (six) hours as needed for wheezing. 75 mL 1  . albuterol (VENTOLIN HFA) 108 (90 Base) MCG/ACT inhaler Inhale 2 puffs into the lungs every 4 (four) hours as needed for wheezing. 1 Inhaler 1  . BAYER MICROLET LANCETS lancets USE TO CHECK BLOOD SUGAR 2 TIMES A DAY AS NEEDED 100 each 11  . beclomethasone (QVAR) 80 MCG/ACT inhaler Inhale 2 puffs into the lungs  2 (two) times daily as needed. 1 Inhaler 3  . Cholecalciferol (VITAMIN D-3 PO) Take 1 tablet by mouth daily.    . CONTOUR NEXT TEST test strip USE TO CHECK BLOOD SUGAR 2 TIMES A DAY AS NEEDED 100 each 11  . metFORMIN (GLUCOPHAGE) 1000 MG tablet Take 1 tablet (1,000 mg total) by mouth 2 (two) times daily with a meal. 180 tablet 1  . Multiple Vitamin (MULTIVITAMIN) capsule Take 1 capsule by mouth daily.     No current facility-administered medications for this visit.    Allergies  Allergen Reactions  . Penicillins Nausea Only and Rash     Review of Systems: All systems reviewed and negative except where noted in HPI.   Lab Results  Component Value Date   WBC 8.4 06/20/2017   HGB 13.4 06/20/2017   HCT 41.1 06/20/2017   MCV  84.5 06/20/2017   PLT 312.0 06/20/2017    Lab Results  Component Value Date   CREATININE 0.80 06/20/2017   BUN 17 06/20/2017   NA 141 06/20/2017   K 4.6 06/20/2017   CL 106 06/20/2017   CO2 28 06/20/2017    Lab Results  Component Value Date   ALT 13 06/20/2017   AST 13 06/20/2017   ALKPHOS 63 06/20/2017   BILITOT 0.5 06/20/2017     Physical Exam: BP 110/72   Pulse 80   Ht 5\' 7"  (1.702 m)   Wt (!) 333 lb 9.6 oz (151.3 kg)   BMI 52.25 kg/m  Constitutional: Pleasant, female in no acute distress. HEENT: Normocephalic and atraumatic. Conjunctivae are normal. No scleral icterus. Neck supple.  Cardiovascular: Normal rate, regular rhythm.  Pulmonary/chest: Effort normal and breath sounds normal. No wheezing, rales or rhonchi. Abdominal: Soft, protuberant, nontender. There are no masses palpable. No hepatomegaly. Extremities: no edema Lymphadenopathy: No cervical adenopathy noted. Neurological: Alert and oriented to person place and time. Skin: Skin is warm and dry. No rashes noted. Psychiatric: Normal mood and affect. Behavior is normal.   ASSESSMENT AND PLAN: 61 year old female with history of morbid obesity, here to discuss options for colon cancer screening.  She has had a prior optical colonoscopy, although the exact date of this is unknown. We need to obtain those records to clarify when her last exam was and what it showed, to determine the next time she is due for screening. We discussed that when she is due for screening, her options are repeating an optical colonoscopy versus stool based testing. We discussed the risks and benefits of each of these. Given her body mass index her case would need to be done at the hospital she wishes to have a colonoscopy. Following discussion of this she wanted to have a colonoscopy when it is needed. Once we get the results of her records we will contact her with results and recommendations. She agreed with the plan.  Aurora Cellar, MD [facility] Pager [phone]  CC: McGowen, Adrian Blackwater, MD

## 2017-10-04 MED FILL — MICROLET LANCETS: 50 days supply | Qty: 100 | Fill #1

## 2017-10-04 MED FILL — CONTOUR NEXT STRIPS: 50 days supply | Qty: 100 | Fill #1

## 2017-10-04 MED FILL — metFORMIN HCL 1000 MG TABS: 1000 | 90 days supply | Qty: 180 | Fill #1

## 2017-10-18 ENCOUNTER — Telehealth: Payer: Self-pay | Admitting: Gastroenterology

## 2017-10-18 NOTE — Telephone Encounter (Signed)
Thanks Jan 

## 2017-10-18 NOTE — Telephone Encounter (Signed)
Called and spoke to pt's husband.  Asked him to have her call me back.

## 2017-10-18 NOTE — Telephone Encounter (Signed)
Records of prior colonoscopies received:  Colonoscopy 09/10/2003 - no polyps, small internal hemorrhoids Colonoscopy 11/30/2010 - Dr. Hettie Holstein at [facility] in [place] - lipoma in the ascending colon, rectal hyperplastic polyp otherwise normal, no polyps. GOOD PREP  Jan can you please let this patient know we received her records. She denied any family history of colon cancer when I saw her in clinic, but her prior provider stated that her mother had colon cancer. Can you please clarify this? If no family history of colon cancer, she does not need another colonoscopy until 11/2020. If she does have a family history of colon cancer, we need to know what age her mother had colon cancer (if prior to age 20, then she is due for a colonoscopy now).  Thanks much for any clarification

## 2017-10-18 NOTE — Telephone Encounter (Signed)
Pt called me back. Her mother did NOT have colon cancer - she has no idea where that came from. Told pt that she will be contacted when it is time to schedule her next colon in 11-2020. She expressed understanding. Placed recall.

## 2017-11-02 ENCOUNTER — Other Ambulatory Visit: Payer: Self-pay

## 2017-11-02 ENCOUNTER — Encounter: Payer: Self-pay | Admitting: Family Medicine

## 2017-11-02 ENCOUNTER — Ambulatory Visit: Payer: BLUE CROSS/BLUE SHIELD | Admitting: Family Medicine

## 2017-11-02 VITALS — BP 138/72 | HR 61 | Temp 98.4°F | Resp 16 | Ht 67.0 in | Wt 333.2 lb

## 2017-11-02 DIAGNOSIS — N390 Urinary tract infection, site not specified: Secondary | ICD-10-CM | POA: Diagnosis not present

## 2017-11-02 DIAGNOSIS — Z23 Encounter for immunization: Secondary | ICD-10-CM

## 2017-11-02 DIAGNOSIS — R300 Dysuria: Secondary | ICD-10-CM | POA: Diagnosis not present

## 2017-11-02 DIAGNOSIS — R319 Hematuria, unspecified: Secondary | ICD-10-CM | POA: Diagnosis not present

## 2017-11-02 DIAGNOSIS — M170 Bilateral primary osteoarthritis of knee: Secondary | ICD-10-CM

## 2017-11-02 DIAGNOSIS — R7303 Prediabetes: Secondary | ICD-10-CM

## 2017-11-02 LAB — POCT URINALYSIS DIPSTICK
Bilirubin, UA: NEGATIVE
GLUCOSE UA: NEGATIVE
Ketones, UA: NEGATIVE
Leukocytes, UA: NEGATIVE
Nitrite, UA: POSITIVE
Protein, UA: NEGATIVE
SPEC GRAV UA: 1.025 (ref 1.010–1.025)
UROBILINOGEN UA: 0.2 U/dL
pH, UA: 5.5 (ref 5.0–8.0)

## 2017-11-02 LAB — POCT GLYCOSYLATED HEMOGLOBIN (HGB A1C)

## 2017-11-02 MED ORDER — MELOXICAM 7.5 MG PO TABS: ORAL_TABLET | ORAL | 6 refills | Status: DC

## 2017-11-02 MED ORDER — SULFAMETHOXAZOLE-TRIMETHOPRIM 800-160 MG PO TABS: 1.0000 | ORAL_TABLET | Freq: Two times a day (BID) | ORAL | 0 refills | Status: DC

## 2017-11-02 MED FILL — SULFAMETHOXAZOLE-TMP DS TAB: 800-160 | 3 days supply | Qty: 6 | Fill #0

## 2017-11-02 MED FILL — MELOXICAM 7.5 MG TABLET: 7.5 | 30 days supply | Qty: 60 | Fill #0

## 2017-11-02 NOTE — Progress Notes (Addendum)
OFFICE VISIT  11/02/2017   CC:  Chief Complaint  Patient presents with  . Follow-up    Prediabetes, fasting blood sugar in 140'2   HPI:    Patient is a 61 y.o.  female who presents for elevated glucoses. Hx of prediabetes, has been taking metformin: highest A1c was 6.3% about 2 yrs ago.  Usual glucoses 120s-fasting. 130s-140s for 5 days in a row a couple of weeks ago.  She thought she may have also had a UTI during that time. She had mild dysuria and frequency.  These sx's have completely resolved.  Says she always has "trace" blood in urine when tested. No fevers or flank pain. No real change in diet.  Decreased activity level lately b/c she is working again some, but this started a few months prior to her week of elevated glucoses.   Now, since that 1 week, she is back in the 110-120s.  She asks for meloxicam rx today for use for bilat severe knee arthritis.  ROS: no polydipsia, no myalgias, +chronic bilat knee pain, no rash, no HA's, no dizziness, no blurry vision.  Past Medical History:  Diagnosis Date  . BPPV (benign paroxysmal positional vertigo) 01/30/2014  . Cyst, breast 01/2012; 06/2012; 08/28/80; 07/27/98   Complicated cysts in upper outer left breast--no evidence of malignancy--f/u bilat diag mammo/left breast u/s on 03/06/15 showed resolution of left breast cysts.  Repeat screening mammogram 1 yr.  . H/O allergic rhinitis    Dr. Ishmael Holter  . History of pneumonia 2009 and 2010  . Mild persistent asthma, well controlled 2014   New adult onset asthma after respiratory infection (Dr. Ishmael Holter)  . Obesity    At one point in time she was going to Bariatric clinic on [address] and got weekly HCG injections, monthly vit B12 injections, and took phentermine daily.  As of 05/2015 she was no longer doing this.  . Osteoarthritis of both knees    No improvement with steroid injections; did synvisc x 3 yrs; bilat replacement was recommended but she declined.  . Prediabetes 2016   05/2015  A1c was 6.3%  . Recurrent low back pain     Past Surgical History:  Procedure Laterality Date  . APPENDECTOMY  1978  . CARDIAC CATHETERIZATION  2010   Normal coronaries per pt  . CARDIOVASCULAR STRESS TEST  2010   abnl per pt; f/u cath clean  . CESAREAN SECTION     x 3  . CHOLECYSTECTOMY  1986  . COLONOSCOPY  2008   normal per pt report (in [place])  . LUMBAR DISC SURGERY  1993   L5/S1  . TONSILLECTOMY AND ADENOIDECTOMY  1967ish  . TOTAL ABDOMINAL HYSTERECTOMY  1994   Ovaries are still in.  This was done for DUB/fibroids.    Outpatient Medications Prior to Visit  Medication Sig Dispense Refill  . albuterol (PROVENTIL) (2.5 MG/3ML) 0.083% nebulizer solution Take 3 mLs (2.5 mg total) by nebulization every 6 (six) hours as needed for wheezing. 75 mL 1  . albuterol (VENTOLIN HFA) 108 (90 Base) MCG/ACT inhaler Inhale 2 puffs into the lungs every 4 (four) hours as needed for wheezing. 1 Inhaler 1  . BAYER MICROLET LANCETS lancets USE TO CHECK BLOOD SUGAR 2 TIMES A DAY AS NEEDED 100 each 11  . beclomethasone (QVAR) 80 MCG/ACT inhaler Inhale 2 puffs into the lungs 2 (two) times daily as needed. 1 Inhaler 3  . Cholecalciferol (VITAMIN D-3 PO) Take 1 tablet by mouth daily.    Marland Kitchen  CONTOUR NEXT TEST test strip USE TO CHECK BLOOD SUGAR 2 TIMES A DAY AS NEEDED 100 each 11  . metFORMIN (GLUCOPHAGE) 1000 MG tablet Take 1 tablet (1,000 mg total) by mouth 2 (two) times daily with a meal. 180 tablet 1  . Multiple Vitamin (MULTIVITAMIN) capsule Take 1 capsule by mouth daily.     No facility-administered medications prior to visit.     Allergies  Allergen Reactions  . Penicillins Nausea Only and Rash    ROS As per HPI  PE: Blood pressure 138/72, pulse 61, temperature 98.4 F (36.9 C), temperature source Oral, resp. rate 16, height 5\' 7"  (1.702 m), weight (!) 333 lb 4 oz (151.2 kg), SpO2 96 %. Gen: Alert, well appearing.  Patient is oriented to person, place, time, and situation. AFFECT:  pleasant, lucid thought and speech. No further exam today.  LABS:   CC UA today:   POC A1c today: 5.6%  Lab Results  Component Value Date   TSH 1.09 06/20/2017   Lab Results  Component Value Date   WBC 8.4 06/20/2017   HGB 13.4 06/20/2017   HCT 41.1 06/20/2017   MCV 84.5 06/20/2017   PLT 312.0 06/20/2017   Lab Results  Component Value Date   CREATININE 0.80 06/20/2017   BUN 17 06/20/2017   NA 141 06/20/2017   K 4.6 06/20/2017   CL 106 06/20/2017   CO2 28 06/20/2017   Lab Results  Component Value Date   ALT 13 06/20/2017   AST 13 06/20/2017   ALKPHOS 63 06/20/2017   BILITOT 0.5 06/20/2017   Lab Results  Component Value Date   CHOL 199 06/20/2017   Lab Results  Component Value Date   HDL 84.00 06/20/2017   Lab Results  Component Value Date   LDLCALC 92 06/20/2017   Lab Results  Component Value Date   TRIG 116.0 06/20/2017   Lab Results  Component Value Date   CHOLHDL 2 06/20/2017   Lab Results  Component Value Date   HGBA1C 5..6 11/02/2017    CC UA today: + small blood and + nitrite.  Otherwise normal.  IMPRESSION AND PLAN:  1) Prediabetes:   A1c today 5.6%---GREAT.  REassured pt today. Her recent mild elevation in baseline fasting glucose may be secondary to UTI.  2) UTI; symptoms resolved but urine abnl today. Will start bactrim DS 1 bid x 3d and send urine for c/s.  3) Chronic bilat knee pain from significant DJD: She gets q89mo steroid injections. Will start meloxicam 7.5, 1-2 qd rx'd today.  An After Visit Summary was printed and given to the patient.  FOLLOW UP: Return for keep appt already scheduled w/me for CPE.  Signed:  Crissie Sickles, MD           11/02/2017

## 2017-11-05 LAB — URINE CULTURE
MICRO NUMBER: 81262702
SPECIMEN QUALITY: ADEQUATE

## 2017-12-13 ENCOUNTER — Encounter: Payer: Self-pay | Admitting: Family Medicine

## 2017-12-13 ENCOUNTER — Ambulatory Visit: Payer: Self-pay | Admitting: Family Medicine

## 2017-12-13 ENCOUNTER — Ambulatory Visit (INDEPENDENT_AMBULATORY_CARE_PROVIDER_SITE_OTHER): Payer: Self-pay | Admitting: Family Medicine

## 2017-12-13 VITALS — BP 101/69 | HR 66 | Temp 98.4°F | Resp 16 | Ht 67.0 in | Wt 332.5 lb

## 2017-12-13 DIAGNOSIS — Z87440 Personal history of urinary (tract) infections: Secondary | ICD-10-CM | POA: Diagnosis not present

## 2017-12-13 DIAGNOSIS — R3129 Other microscopic hematuria: Secondary | ICD-10-CM | POA: Diagnosis not present

## 2017-12-13 DIAGNOSIS — R7303 Prediabetes: Secondary | ICD-10-CM | POA: Diagnosis not present

## 2017-12-13 LAB — POCT URINALYSIS DIPSTICK
Bilirubin, UA: NEGATIVE
GLUCOSE UA: NEGATIVE
Ketones, UA: NEGATIVE
LEUKOCYTES UA: NEGATIVE
NITRITE UA: NEGATIVE
PH UA: 5.5 (ref 5.0–8.0)
PROTEIN UA: NEGATIVE
Spec Grav, UA: 1.025 (ref 1.010–1.025)
Urobilinogen, UA: 0.2 E.U./dL

## 2017-12-13 MED ORDER — METFORMIN HCL 500 MG PO TABS: 500.0000 mg | ORAL_TABLET | Freq: Two times a day (BID) | ORAL | 1 refills | Status: DC

## 2017-12-13 MED ORDER — GLUCOSE BLOOD VI STRP: ORAL_STRIP | 11 refills | Status: DC

## 2017-12-13 MED ORDER — BAYER MICROLET LANCETS MISC: 11 refills | Status: DC

## 2017-12-13 NOTE — Progress Notes (Signed)
OFFICE VISIT  12/15/2017   CC:  Chief Complaint  Patient presents with  . Follow-up    RCI, pt is fasting   HPI:    Patient is a 61 y.o. African-American female who presents for 3 mo f/u prediabetes. I last saw her 6 weeks ago and A1c was excellent--stayed on same med/same dose. She also had UTI: treated with bactrim x 3d (e coli). I also started her on meloxicam for her bilat knee arthritis.  Home gluc monitoring: range 105-130 fasting. Occ 2H pp always <140.  Urine: no urinary complaints. Pt requests repeat UA today to see if urine clear since last time she was dx's with UTI she really only had minimal sx's.  Past Medical History:  Diagnosis Date  . BPPV (benign paroxysmal positional vertigo) 01/30/2014  . Cyst, breast 01/2012; 06/2012; 01/03/49; 08/28/25   Complicated cysts in upper outer left breast--no evidence of malignancy--f/u bilat diag mammo/left breast u/s on 03/06/15 showed resolution of left breast cysts.  Repeat screening mammogram 1 yr.  . H/O allergic rhinitis    Dr. Ishmael Holter  . History of pneumonia 2009 and 2010  . Mild persistent asthma, well controlled 2014   New adult onset asthma after respiratory infection (Dr. Ishmael Holter)  . Obesity    At one point in time she was going to Bariatric clinic on [address] and got weekly HCG injections, monthly vit B12 injections, and took phentermine daily.  As of 05/2015 she was no longer doing this.  . Osteoarthritis of both knees    No improvement with steroid injections; did synvisc x 3 yrs; bilat replacement was recommended but she declined.  . Prediabetes 2016   05/2015 A1c was 6.3%  . Recurrent low back pain     Past Surgical History:  Procedure Laterality Date  . APPENDECTOMY  1978  . CARDIAC CATHETERIZATION  2010   Normal coronaries per pt  . CARDIOVASCULAR STRESS TEST  2010   abnl per pt; f/u cath clean  . CESAREAN SECTION     x 3  . CHOLECYSTECTOMY  1986  . COLONOSCOPY  2008   normal per pt report (in [place])  .  LUMBAR DISC SURGERY  1993   L5/S1  . TONSILLECTOMY AND ADENOIDECTOMY  1967ish  . TOTAL ABDOMINAL HYSTERECTOMY  1994   Ovaries are still in.  This was done for DUB/fibroids.    Outpatient Medications Prior to Visit  Medication Sig Dispense Refill  . Cholecalciferol (VITAMIN D-3 PO) Take 1 tablet by mouth daily.    . meloxicam (MOBIC) 7.5 MG tablet 1-2 tabs po qd prn arthritis pain 60 tablet 6  . Multiple Vitamin (MULTIVITAMIN) capsule Take 1 capsule by mouth daily.    Marland Kitchen BAYER MICROLET LANCETS lancets USE TO CHECK BLOOD SUGAR 2 TIMES A DAY AS NEEDED 100 each 11  . CONTOUR NEXT TEST test strip USE TO CHECK BLOOD SUGAR 2 TIMES A DAY AS NEEDED 100 each 11  . metFORMIN (GLUCOPHAGE) 1000 MG tablet Take 1 tablet (1,000 mg total) by mouth 2 (two) times daily with a meal. 180 tablet 1  . albuterol (PROVENTIL) (2.5 MG/3ML) 0.083% nebulizer solution Take 3 mLs (2.5 mg total) by nebulization every 6 (six) hours as needed for wheezing. (Patient not taking: Reported on 12/13/2017) 75 mL 1  . albuterol (VENTOLIN HFA) 108 (90 Base) MCG/ACT inhaler Inhale 2 puffs into the lungs every 4 (four) hours as needed for wheezing. (Patient not taking: Reported on 12/13/2017) 1 Inhaler 1  .  beclomethasone (QVAR) 80 MCG/ACT inhaler Inhale 2 puffs into the lungs 2 (two) times daily as needed. (Patient not taking: Reported on 12/13/2017) 1 Inhaler 3  . sulfamethoxazole-trimethoprim (BACTRIM DS,SEPTRA DS) 800-160 MG tablet Take 1 tablet 2 (two) times daily by mouth. (Patient not taking: Reported on 12/13/2017) 6 tablet 0   No facility-administered medications prior to visit.     Allergies  Allergen Reactions  . Penicillins Nausea Only and Rash    ROS As per HPI  PE: Blood pressure 101/69, pulse 66, temperature 98.4 F (36.9 C), temperature source Oral, resp. rate 16, height 5\' 7"  (1.702 m), weight (!) 332 lb 8 oz (150.8 kg), SpO2 95 %. Body mass index is 52.08 kg/m.  Gen: Alert, well appearing.  Patient is  oriented to person, place, time, and situation. AFFECT: pleasant, lucid thought and speech. No further exam today.  LABS:  Lab Results  Component Value Date   TSH 1.09 06/20/2017   Lab Results  Component Value Date   WBC 8.4 06/20/2017   HGB 13.4 06/20/2017   HCT 41.1 06/20/2017   MCV 84.5 06/20/2017   PLT 312.0 06/20/2017   Lab Results  Component Value Date   CREATININE 0.80 06/20/2017   BUN 17 06/20/2017   NA 141 06/20/2017   K 4.6 06/20/2017   CL 106 06/20/2017   CO2 28 06/20/2017   Lab Results  Component Value Date   ALT 13 06/20/2017   AST 13 06/20/2017   ALKPHOS 63 06/20/2017   BILITOT 0.5 06/20/2017   Lab Results  Component Value Date   CHOL 199 06/20/2017   Lab Results  Component Value Date   HDL 84.00 06/20/2017   Lab Results  Component Value Date   LDLCALC 92 06/20/2017   Lab Results  Component Value Date   TRIG 116.0 06/20/2017   Lab Results  Component Value Date   CHOLHDL 2 06/20/2017   Lab Results  Component Value Date   HGBA1C 5..6 11/02/2017   CC UA today: trace blood, otherwise normal.   IMPRESSION AND PLAN:  1) Prediabetes:  HbA1c great 6 weeks ago. She is willing to try decreasing metformin to 500 mg bid--new rx given today. New rx for lancets and test strips: check gluc qd.  2) Hx of UTI: UA today with only trace blood. Will send for c/s. No meds at this time.  An After Visit Summary was printed and given to the patient.  FOLLOW UP: Return in about 6 months (around 06/13/2018) for routine chronic illness f/u.  Signed:  Crissie Sickles, MD           12/15/2017

## 2017-12-15 ENCOUNTER — Encounter: Payer: Self-pay | Admitting: Family Medicine

## 2017-12-15 LAB — URINE CULTURE
MICRO NUMBER: 81430422
SPECIMEN QUALITY: ADEQUATE

## 2017-12-21 ENCOUNTER — Ambulatory Visit: Payer: Self-pay | Admitting: Family Medicine

## 2017-12-26 DIAGNOSIS — K7689 Other specified diseases of liver: Secondary | ICD-10-CM

## 2017-12-26 HISTORY — DX: Other specified diseases of liver: K76.89

## 2018-03-14 ENCOUNTER — Encounter: Payer: Self-pay | Admitting: Family Medicine

## 2018-03-14 ENCOUNTER — Ambulatory Visit: Payer: No Typology Code available for payment source | Admitting: Family Medicine

## 2018-03-14 VITALS — BP 104/70 | HR 61 | Temp 97.4°F | Resp 16 | Ht 67.0 in | Wt 334.0 lb

## 2018-03-14 DIAGNOSIS — R739 Hyperglycemia, unspecified: Secondary | ICD-10-CM | POA: Diagnosis not present

## 2018-03-14 DIAGNOSIS — R7303 Prediabetes: Secondary | ICD-10-CM

## 2018-03-14 LAB — POCT GLYCOSYLATED HEMOGLOBIN (HGB A1C): Hemoglobin A1C: 5.6

## 2018-03-14 MED ORDER — METFORMIN HCL 1000 MG PO TABS: 1000.0000 mg | ORAL_TABLET | Freq: Two times a day (BID) | ORAL | 6 refills | Status: DC

## 2018-03-14 NOTE — Progress Notes (Signed)
OFFICE VISIT  03/14/2018   CC:  Chief Complaint  Patient presents with  . Diabetes    follow up   HPI:    Patient is a 62 y.o. African-American female who presents for f/u prediabetes. She is on metformin 500 mg bid, is always VERY concerned about her glucoses and potential for progression to DM 2. Says glucoses started "spiking" for about the last 1 month---going up to 140s-155 fasting. No recent change in diet or exercise level.  Last 4 days fasting gluc's consistently 150s.  She has recently (1 mo ago) increased her metformin to 1000 mg bid. A few glucose checks later in the day 100-110.  ROS: no polyuria, polydipsia, CP, SOB, feet sx's, LE swelling, rash, melena, or urinary complaints.  Past Medical History:  Diagnosis Date  . BPPV (benign paroxysmal positional vertigo) 01/30/2014  . Cyst, breast 01/2012; 06/2012; 06/25/68; 06/01/88   Complicated cysts in upper outer left breast--no evidence of malignancy--f/u bilat diag mammo/left breast u/s on 03/06/15 showed resolution of left breast cysts.  Repeat screening mammogram 1 yr.  . H/O allergic rhinitis    Dr. Ishmael Holter  . History of pneumonia 2009 and 2010  . Mild persistent asthma, well controlled 2014   New adult onset asthma after respiratory infection (Dr. Ishmael Holter)  . Morbid obesity ([place])    BMI 50+.  At one point in time she was going to Bariatric clinic on [address] and got weekly HCG injections, monthly vit B12 injections, and took phentermine daily.  As of 05/2015 she was no longer doing this.  . Osteoarthritis of both knees    No improvement with steroid injections; did synvisc x 3 yrs; bilat replacement was recommended but she declined.  . Prediabetes 2016   05/2015 A1c was 6.3%  . Recurrent low back pain     Past Surgical History:  Procedure Laterality Date  . APPENDECTOMY  1978  . CARDIAC CATHETERIZATION  2010   Normal coronaries per pt  . CARDIOVASCULAR STRESS TEST  2010   abnl per pt; f/u cath clean  . CESAREAN  SECTION     x 3  . CHOLECYSTECTOMY  1986  . COLONOSCOPY  2008   normal per pt report (in [place])  . LUMBAR DISC SURGERY  1993   L5/S1  . TONSILLECTOMY AND ADENOIDECTOMY  1967ish  . TOTAL ABDOMINAL HYSTERECTOMY  1994   Ovaries are still in.  This was done for DUB/fibroids.    Outpatient Medications Prior to Visit  Medication Sig Dispense Refill  . BAYER MICROLET LANCETS lancets Use to check blood sugar once daily 100 each 11  . Cholecalciferol (VITAMIN D-3 PO) Take 1 tablet by mouth daily.    Marland Kitchen glucose blood (CONTOUR NEXT TEST) test strip Use to check blood sugar once daily 100 each 11  . meloxicam (MOBIC) 7.5 MG tablet 1-2 tabs po qd prn arthritis pain 60 tablet 6  . metFORMIN (GLUCOPHAGE) 500 MG tablet Take 1 tablet (500 mg total) by mouth 2 (two) times daily with a meal. 180 tablet 1  . Multiple Vitamin (MULTIVITAMIN) capsule Take 1 capsule by mouth daily.    Marland Kitchen albuterol (PROVENTIL) (2.5 MG/3ML) 0.083% nebulizer solution Take 3 mLs (2.5 mg total) by nebulization every 6 (six) hours as needed for wheezing. (Patient not taking: Reported on 12/13/2017) 75 mL 1  . albuterol (VENTOLIN HFA) 108 (90 Base) MCG/ACT inhaler Inhale 2 puffs into the lungs every 4 (four) hours as needed for wheezing. (Patient not taking: Reported  on 12/13/2017) 1 Inhaler 1  . beclomethasone (QVAR) 80 MCG/ACT inhaler Inhale 2 puffs into the lungs 2 (two) times daily as needed. (Patient not taking: Reported on 12/13/2017) 1 Inhaler 3   No facility-administered medications prior to visit.     Allergies  Allergen Reactions  . Penicillins Nausea Only and Rash    ROS As per HPI  PE: Blood pressure 104/70, pulse 61, temperature (!) 97.4 F (36.3 C), temperature source Temporal, resp. rate 16, height 5\' 7"  (1.702 m), weight (!) 334 lb (151.5 kg), SpO2 97 %. Gen: Alert, well appearing.  Patient is oriented to person, place, time, and situation. AFFECT: pleasant, lucid thought and speech. No further exam  today.  LABS:    Chemistry      Component Value Date/Time   NA 141 06/20/2017 1030   K 4.6 06/20/2017 1030   CL 106 06/20/2017 1030   CO2 28 06/20/2017 1030   BUN 17 06/20/2017 1030   CREATININE 0.80 06/20/2017 1030      Component Value Date/Time   CALCIUM 9.7 06/20/2017 1030   ALKPHOS 63 06/20/2017 1030   AST 13 06/20/2017 1030   ALT 13 06/20/2017 1030   BILITOT 0.5 06/20/2017 1030     Lab Results  Component Value Date   HGBA1C 5..6 11/02/2017   POC A1c today is 5.6%.  IMPRESSION AND PLAN:  1) Prediabetes--undergoing some natural progression of dz lately. Unfortunately, she is unable to exercise due to obesity + chronic bilat osteoarthritic knee pain. She will be pursuing stem cell therapy in near future for this.  With regard to prediab/med changes-- Will continue with her recent self-increase of metformin-- 1000 mg bid consistently. Continue to work on diet.  Since A1c is unchanged at this time, we'll given this med change more time and if home glucoses not improving in 1 mo will add low dose actos.  An After Visit Summary was printed and given to the patient.  FOLLOW UP: Return in about 4 weeks (around 04/11/2018) for f/u prediabetes/meds.  Signed:  Crissie Sickles, MD           03/14/2018

## 2018-04-12 ENCOUNTER — Ambulatory Visit (INDEPENDENT_AMBULATORY_CARE_PROVIDER_SITE_OTHER): Payer: No Typology Code available for payment source | Admitting: Family Medicine

## 2018-04-12 ENCOUNTER — Encounter: Payer: Self-pay | Admitting: Family Medicine

## 2018-04-12 VITALS — BP 93/62 | HR 69 | Temp 97.9°F | Ht 67.0 in | Wt 334.0 lb

## 2018-04-12 DIAGNOSIS — R7303 Prediabetes: Secondary | ICD-10-CM | POA: Diagnosis not present

## 2018-04-12 NOTE — Progress Notes (Signed)
OFFICE VISIT  04/12/2018   CC:  Chief Complaint  Patient presents with  . Follow-up    pre-diabetes/meds     HPI:    Patient is a 62 y.o. African-American female who presents for 1 mo f/u prediabetes. By recent fasting glucoses at home she actually has true DM 2, but she is VERY apprehensive about this dx so we are proceeding as boldly as possible to get glucoses normal w/out causing any hypoglycemia. Last month we left her on metformin 1000 mg bid after she had self-increased her dose of this med in response to some elevated fasting glucoses in 150s range. A1c last visit was still just 5.6%.  Fasting glucoses: avg 130 over the last 1 mo. She does stress about her glucoses a lot.  Past Medical History:  Diagnosis Date  . BPPV (benign paroxysmal positional vertigo) 01/30/2014  . Cyst, breast 01/2012; 06/2012; 07/03/23; 01/29/52   Complicated cysts in upper outer left breast--no evidence of malignancy--f/u bilat diag mammo/left breast u/s on 03/06/15 showed resolution of left breast cysts.  Repeat screening mammogram 1 yr.  . H/O allergic rhinitis    Dr. Ishmael Holter  . History of pneumonia 2009 and 2010  . Mild persistent asthma, well controlled 2014   New adult onset asthma after respiratory infection (Dr. Ishmael Holter)  . Morbid obesity ([place])    BMI 50+.  At one point in time she was going to Bariatric clinic on [address] and got weekly HCG injections, monthly vit B12 injections, and took phentermine daily.  As of 05/2015 she was no longer doing this.  . Osteoarthritis of both knees    No improvement with steroid injections; did synvisc x 3 yrs; bilat replacement was recommended but she declined.  . Prediabetes 2016   05/2015 A1c was 6.3%  . Recurrent low back pain     Past Surgical History:  Procedure Laterality Date  . APPENDECTOMY  1978  . CARDIAC CATHETERIZATION  2010   Normal coronaries per pt  . CARDIOVASCULAR STRESS TEST  2010   abnl per pt; f/u cath clean  . CESAREAN SECTION      x 3  . CHOLECYSTECTOMY  1986  . COLONOSCOPY  2008   normal per pt report (in [place])  . LUMBAR DISC SURGERY  1993   L5/S1  . TONSILLECTOMY AND ADENOIDECTOMY  1967ish  . TOTAL ABDOMINAL HYSTERECTOMY  1994   Ovaries are still in.  This was done for DUB/fibroids.    Outpatient Medications Prior to Visit  Medication Sig Dispense Refill  . BAYER MICROLET LANCETS lancets Use to check blood sugar once daily 100 each 11  . beclomethasone (QVAR) 80 MCG/ACT inhaler Inhale 2 puffs into the lungs 2 (two) times daily as needed. 1 Inhaler 3  . Cholecalciferol (VITAMIN D-3 PO) Take 1 tablet by mouth daily.    Marland Kitchen glucose blood (CONTOUR NEXT TEST) test strip Use to check blood sugar once daily 100 each 11  . meloxicam (MOBIC) 7.5 MG tablet 1-2 tabs po qd prn arthritis pain 60 tablet 6  . metFORMIN (GLUCOPHAGE) 1000 MG tablet Take 1 tablet (1,000 mg total) by mouth 2 (two) times daily with a meal. 60 tablet 6  . Multiple Vitamin (MULTIVITAMIN) capsule Take 1 capsule by mouth daily.    Marland Kitchen albuterol (PROVENTIL) (2.5 MG/3ML) 0.083% nebulizer solution Take 3 mLs (2.5 mg total) by nebulization every 6 (six) hours as needed for wheezing. (Patient not taking: Reported on 04/12/2018) 75 mL 1  . albuterol (  VENTOLIN HFA) 108 (90 Base) MCG/ACT inhaler Inhale 2 puffs into the lungs every 4 (four) hours as needed for wheezing. (Patient not taking: Reported on 12/13/2017) 1 Inhaler 1   No facility-administered medications prior to visit.     Allergies  Allergen Reactions  . Penicillins Nausea Only and Rash    ROS As per HPI  PE: Blood pressure 93/62, pulse 69, temperature 97.9 F (36.6 C), temperature source Oral, height 5\' 7"  (1.702 m), weight (!) 334 lb (151.5 kg), SpO2 93 %. Gen: Alert, well appearing.  Patient is oriented to person, place, time, and situation. AFFECT: pleasant, lucid thought and speech. No further exam today.  LABS:    Chemistry      Component Value Date/Time   NA 141 06/20/2017 1030    K 4.6 06/20/2017 1030   CL 106 06/20/2017 1030   CO2 28 06/20/2017 1030   BUN 17 06/20/2017 1030   CREATININE 0.80 06/20/2017 1030      Component Value Date/Time   CALCIUM 9.7 06/20/2017 1030   ALKPHOS 63 06/20/2017 1030   AST 13 06/20/2017 1030   ALT 13 06/20/2017 1030   BILITOT 0.5 06/20/2017 1030     Lab Results  Component Value Date   HGBA1C 5.6 03/14/2018   Lab Results  Component Value Date   CHOL 199 06/20/2017   HDL 84.00 06/20/2017   LDLCALC 92 06/20/2017   TRIG 116.0 06/20/2017   CHOLHDL 2 06/20/2017   Lab Results  Component Value Date   TSH 1.09 06/20/2017    IMPRESSION AND PLAN:  DM 2 by fasting glucose criteria, but A1c has never gone over 6.2%. Continue current med dosing, try to improve diet and exercise habits (limited by chronic knee pains from osteoarthritis). Continue glucose check qd.  An After Visit Summary was printed and given to the patient.  FOLLOW UP: Return in about 6 months (around 10/12/2018) for routine chronic illness f/u. (she has no insurance)  Signed:  Crissie Sickles, MD           04/12/2018

## 2018-05-06 ENCOUNTER — Encounter (HOSPITAL_BASED_OUTPATIENT_CLINIC_OR_DEPARTMENT_OTHER): Payer: Self-pay | Admitting: Emergency Medicine

## 2018-05-06 ENCOUNTER — Emergency Department (HOSPITAL_BASED_OUTPATIENT_CLINIC_OR_DEPARTMENT_OTHER)
Admission: EM | Admit: 2018-05-06 | Discharge: 2018-05-06 | Disposition: A | Discharge status: Home / Self Care | Payer: No Typology Code available for payment source | Attending: Emergency Medicine | Admitting: Emergency Medicine

## 2018-05-06 ENCOUNTER — Other Ambulatory Visit: Payer: Self-pay

## 2018-05-06 ENCOUNTER — Emergency Department (HOSPITAL_BASED_OUTPATIENT_CLINIC_OR_DEPARTMENT_OTHER): Payer: No Typology Code available for payment source

## 2018-05-06 DIAGNOSIS — Z87891 Personal history of nicotine dependence: Secondary | ICD-10-CM | POA: Insufficient documentation

## 2018-05-06 DIAGNOSIS — Z7984 Long term (current) use of oral hypoglycemic drugs: Secondary | ICD-10-CM | POA: Diagnosis not present

## 2018-05-06 DIAGNOSIS — R109 Unspecified abdominal pain: Secondary | ICD-10-CM | POA: Diagnosis present

## 2018-05-06 DIAGNOSIS — N201 Calculus of ureter: Secondary | ICD-10-CM | POA: Diagnosis not present

## 2018-05-06 DIAGNOSIS — Z79899 Other long term (current) drug therapy: Secondary | ICD-10-CM | POA: Insufficient documentation

## 2018-05-06 DIAGNOSIS — J45909 Unspecified asthma, uncomplicated: Secondary | ICD-10-CM | POA: Diagnosis not present

## 2018-05-06 LAB — URINALYSIS, MICROSCOPIC (REFLEX)

## 2018-05-06 LAB — CBC WITH DIFFERENTIAL/PLATELET
BASOS PCT: 0 %
Basophils Absolute: 0 10*3/uL (ref 0.0–0.1)
Eosinophils Absolute: 0 10*3/uL (ref 0.0–0.7)
Eosinophils Relative: 0 %
HEMATOCRIT: 39.4 % (ref 36.0–46.0)
Hemoglobin: 13.1 g/dL (ref 12.0–15.0)
LYMPHS ABS: 1.1 10*3/uL (ref 0.7–4.0)
LYMPHS PCT: 8 %
MCH: 28.1 pg (ref 26.0–34.0)
MCHC: 33.2 g/dL (ref 30.0–36.0)
MCV: 84.5 fL (ref 78.0–100.0)
MONO ABS: 0.6 10*3/uL (ref 0.1–1.0)
MONOS PCT: 4 %
NEUTROS ABS: 13.2 10*3/uL — AB (ref 1.7–7.7)
Neutrophils Relative %: 88 %
Platelets: 294 10*3/uL (ref 150–400)
RBC: 4.66 MIL/uL (ref 3.87–5.11)
RDW: 15.3 % (ref 11.5–15.5)
WBC: 15 10*3/uL — ABNORMAL HIGH (ref 4.0–10.5)

## 2018-05-06 LAB — COMPREHENSIVE METABOLIC PANEL
ALBUMIN: 3.8 g/dL (ref 3.5–5.0)
ALK PHOS: 63 U/L (ref 38–126)
ALT: 16 U/L (ref 14–54)
AST: 21 U/L (ref 15–41)
Anion gap: 7 (ref 5–15)
BILIRUBIN TOTAL: 0.3 mg/dL (ref 0.3–1.2)
BUN: 20 mg/dL (ref 6–20)
CO2: 23 mmol/L (ref 22–32)
Calcium: 9.3 mg/dL (ref 8.9–10.3)
Chloride: 109 mmol/L (ref 101–111)
Creatinine, Ser: 0.99 mg/dL (ref 0.44–1.00)
GFR calc Af Amer: >60 mL/min (ref >60)
GFR calc non Af Amer: 60 mL/min — ABNORMAL LOW (ref >60)
Glucose, Bld: 121 mg/dL — ABNORMAL HIGH (ref 65–99)
POTASSIUM: 4.2 mmol/L (ref 3.5–5.1)
Sodium: 139 mmol/L (ref 135–145)
TOTAL PROTEIN: 7.3 g/dL (ref 6.5–8.1)

## 2018-05-06 LAB — URINALYSIS, ROUTINE W REFLEX MICROSCOPIC
Bilirubin Urine: NEGATIVE
Glucose, UA: NEGATIVE mg/dL
KETONES UR: NEGATIVE mg/dL
LEUKOCYTES UA: NEGATIVE
Nitrite: NEGATIVE
Protein, ur: NEGATIVE mg/dL
Specific Gravity, Urine: >1.03 — ABNORMAL HIGH (ref 1.005–1.030)
pH: 5.5 (ref 5.0–8.0)

## 2018-05-06 LAB — LIPASE, BLOOD: Lipase: 37 U/L (ref 11–51)

## 2018-05-06 MED ORDER — ONDANSETRON HCL 4 MG/2ML IJ SOLN
4.0000 mg | Freq: Once | INTRAMUSCULAR | Status: AC
  Administered 2018-05-06: 4 mg via INTRAVENOUS
  Filled 2018-05-06: qty 2

## 2018-05-06 MED ORDER — FENTANYL CITRATE (PF) 100 MCG/2ML IJ SOLN
100.0000 ug | Freq: Once | INTRAMUSCULAR | Status: AC
  Administered 2018-05-06: 100 ug via INTRAVENOUS
  Filled 2018-05-06: qty 2

## 2018-05-06 MED ORDER — MORPHINE SULFATE (PF) 4 MG/ML IV SOLN
4.0000 mg | Freq: Once | INTRAVENOUS | Status: DC
Start: 2018-05-06 — End: 2018-05-06
  Filled 2018-05-06: qty 1

## 2018-05-06 MED ORDER — KETOROLAC TROMETHAMINE 30 MG/ML IJ SOLN: 15.0000 mg | Freq: Once | INTRAMUSCULAR | Status: DC

## 2018-05-06 MED ORDER — KETOROLAC TROMETHAMINE 15 MG/ML IJ SOLN
15.0000 mg | Freq: Once | INTRAMUSCULAR | Status: AC
  Administered 2018-05-06: 15 mg via INTRAVENOUS
  Filled 2018-05-06: qty 1

## 2018-05-06 MED ORDER — ONDANSETRON 4 MG PO TBDP: 4.0000 mg | ORAL_TABLET | Freq: Three times a day (TID) | ORAL | 0 refills | Status: DC | PRN

## 2018-05-06 MED ORDER — OXYCODONE-ACETAMINOPHEN 5-325 MG PO TABS: 1.0000 | ORAL_TABLET | Freq: Three times a day (TID) | ORAL | 0 refills | Status: DC | PRN

## 2018-05-06 NOTE — ED Notes (Signed)
Patient transported to CT 

## 2018-05-06 NOTE — ED Provider Notes (Signed)
[facility] EMERGENCY DEPARTMENT Provider Note   CSN: [id number] Arrival date & time: 05/06/18  1401     History   Chief Complaint Chief Complaint  Patient presents with  . Flank Pain    HPI Taylor Leon is a 62 y.o. female.  HPI Patient presents with right-sided flank/abdominal pain.  Began acutely earlier today.  It is somewhat sharp.  Worse with movements.  Began while she was attempting to go the bathroom.  States she has felt as if she has had to take bowel movements.  No fevers or chills.  No dysuria.  States she has been able to both have bowel movements and urinate.  Has had nausea and vomiting.  States is secondary to the pain. Past Medical History:  Diagnosis Date  . BPPV (benign paroxysmal positional vertigo) 01/30/2014  . Cyst, breast 01/2012; 06/2012; 12/29/41; 12/30/38   Complicated cysts in upper outer left breast--no evidence of malignancy--f/u bilat diag mammo/left breast u/s on 03/06/15 showed resolution of left breast cysts.  Repeat screening mammogram 1 yr.  . H/O allergic rhinitis    Dr. Ishmael Holter  . History of pneumonia 2009 and 2010  . Mild persistent asthma, well controlled 2014   New adult onset asthma after respiratory infection (Dr. Ishmael Holter)  . Morbid obesity ([place])    BMI 50+.  At one point in time she was going to Bariatric clinic on [address] and got weekly HCG injections, monthly vit B12 injections, and took phentermine daily.  As of 05/2015 she was no longer doing this.  . Osteoarthritis of both knees    No improvement with steroid injections; did synvisc x 3 yrs; bilat replacement was recommended but she declined.  . Prediabetes 2016   05/2015 A1c was 6.3%  . Recurrent low back pain     Patient Active Problem List   Diagnosis Date Noted  . Prediabetes 12/26/2014  . Health maintenance examination 01/30/2014  . BPPV (benign paroxysmal positional vertigo) 01/30/2014  . Allergic rhinitis 08/29/2013  . DJD (degenerative joint disease) of knee  09/10/2012  . Asthmatic bronchitis 03/28/2012    Past Surgical History:  Procedure Laterality Date  . APPENDECTOMY  1978  . CARDIAC CATHETERIZATION  2010   Normal coronaries per pt  . CARDIOVASCULAR STRESS TEST  2010   abnl per pt; f/u cath clean  . CESAREAN SECTION     x 3  . CHOLECYSTECTOMY  1986  . COLONOSCOPY  2008   normal per pt report (in [place])  . LUMBAR DISC SURGERY  1993   L5/S1  . TONSILLECTOMY AND ADENOIDECTOMY  1967ish  . TOTAL ABDOMINAL HYSTERECTOMY  1994   Ovaries are still in.  This was done for DUB/fibroids.     OB History   None      Home Medications    Prior to Admission medications   Medication Sig Start Date End Date Taking? Authorizing Provider  Cholecalciferol (VITAMIN D-3 PO) Take 1 tablet by mouth daily.   Yes [provider]  metFORMIN (GLUCOPHAGE) 1000 MG tablet Take 1 tablet (1,000 mg total) by mouth 2 (two) times daily with a meal. 03/14/18  Yes McGowen, Adrian Blackwater, MD  Multiple Vitamin (MULTIVITAMIN) capsule Take 1 capsule by mouth daily.   Yes [provider]  albuterol (PROVENTIL) (2.5 MG/3ML) 0.083% nebulizer solution Take 3 mLs (2.5 mg total) by nebulization every 6 (six) hours as needed for wheezing. Patient not taking: Reported on 04/12/2018 12/06/16   McGowen, Adrian Blackwater, MD  albuterol (VENTOLIN HFA) 108 (90 Base) MCG/ACT inhaler Inhale 2 puffs into the lungs every 4 (four) hours as needed for wheezing. Patient not taking: Reported on 12/13/2017 12/06/16 02/10/19  Tammi Sou, MD  BAYER MICROLET LANCETS lancets Use to check blood sugar once daily 12/13/17   McGowen, Adrian Blackwater, MD  beclomethasone (QVAR) 80 MCG/ACT inhaler Inhale 2 puffs into the lungs 2 (two) times daily as needed. 12/06/16   McGowen, Adrian Blackwater, MD  glucose blood (CONTOUR NEXT TEST) test strip Use to check blood sugar once daily 12/13/17   McGowen, Adrian Blackwater, MD  meloxicam (MOBIC) 7.5 MG tablet 1-2 tabs po qd prn arthritis pain 11/02/17   McGowen, Adrian Blackwater, MD    ondansetron (ZOFRAN-ODT) 4 MG disintegrating tablet Take 1 tablet (4 mg total) by mouth every 8 (eight) hours as needed for nausea or vomiting. 05/06/18   Davonna Belling, MD  oxyCODONE-acetaminophen (PERCOCET/ROXICET) 5-325 MG tablet Take 1-2 tablets by mouth every 8 (eight) hours as needed for severe pain. 05/06/18   Davonna Belling, MD    Family History Family History  Problem Relation Age of Onset  . Arthritis Mother   . Heart disease Mother 63  . Hypertension Mother   . Diabetes Mother   . Arthritis Paternal Grandmother     Social History Social History   Tobacco Use  . Smoking status: Former Smoker    Types: Cigarettes    Last attempt to quit: 12/28/2009    Years since quitting: 8.3  . Smokeless tobacco: Never Used  Substance Use Topics  . Alcohol use: Yes    Comment: social  . Drug use: No     Allergies   Penicillins   Review of Systems Review of Systems  Constitutional: Positive for appetite change. Negative for fever.  HENT: Negative for congestion.   Respiratory: Negative for shortness of breath.   Cardiovascular: Negative for chest pain.  Gastrointestinal: Positive for abdominal pain, nausea and vomiting.  Genitourinary: Positive for flank pain. Negative for frequency.  Musculoskeletal: Positive for back pain.  Skin: Negative for rash.  Hematological: Negative for adenopathy.  Psychiatric/Behavioral: Negative for confusion.     Physical Exam Updated Vital Signs BP 105/72 (BP Location: Right Arm)   Pulse 67   Temp 98 F (36.7 C) (Oral)   Resp 18   Ht 5\' 7"  (1.702 m)   Wt (!) 149.7 kg (330 lb)   SpO2 98%   BMI 51.69 kg/m   Physical Exam  Constitutional: She appears well-developed.  Patient is morbidly obese.  HENT:  Head: Atraumatic.  Eyes: EOM are normal.  Neck: Neck supple.  Cardiovascular: Normal rate.  Pulmonary/Chest: Effort normal.  Abdominal: There is tenderness.  Genitourinary:  Genitourinary Comments: Right CVA tenderness.   Musculoskeletal: She exhibits no edema or tenderness.  Neurological: She is alert.  Skin: Skin is warm. Capillary refill takes less than 2 seconds.     ED Treatments / Results  Labs (all labs ordered are listed, but only abnormal results are displayed) Labs Reviewed  COMPREHENSIVE METABOLIC PANEL - Abnormal; Notable for the following components:      Result Value   Glucose, Bld 121 (*)    GFR calc non Af Amer 60 (*)    All other components within normal limits  URINALYSIS, ROUTINE W REFLEX MICROSCOPIC - Abnormal; Notable for the following components:   Specific Gravity, Urine >1.030 (*)    Hgb urine dipstick TRACE (*)    All other components within normal limits  CBC WITH DIFFERENTIAL/PLATELET - Abnormal; Notable for the following components:   WBC 15.0 (*)    Neutro Abs 13.2 (*)    All other components within normal limits  URINALYSIS, MICROSCOPIC (REFLEX) - Abnormal; Notable for the following components:   Bacteria, UA RARE (*)    Crystals URIC ACID CRYSTALS (*)    All other components within normal limits  LIPASE, BLOOD    EKG None  Radiology Ct Renal Stone Study  Result Date: 05/06/2018 CLINICAL DATA:  62 year old female with acute RIGHT flank and abdominal pain today. Nausea and vomiting. EXAM: CT ABDOMEN AND PELVIS WITHOUT CONTRAST TECHNIQUE: Multidetector CT imaging of the abdomen and pelvis was performed following the standard protocol without IV contrast. COMPARISON:  None. FINDINGS: Please note that parenchymal abnormalities may be missed without intravenous contrast. Lower chest: No acute abnormality Hepatobiliary: Multiple hepatic cysts are present. The patient is status post cholecystectomy. No biliary dilatation. Pancreas: Unremarkable Spleen: Unremarkable Adrenals/Urinary Tract: A probable 1.5 mm calculus at the RIGHT UVJ causes mild RIGHT hydroureteronephrosis. This calculus is best identified on the coronal and sagittal images. A 6 mm angiomyolipoma within the  mid LEFT kidney is noted. The adrenal glands are unremarkable. Stomach/Bowel: Stomach is within normal limits. No evidence of bowel wall thickening, distention, or inflammatory changes. Vascular/Lymphatic: No significant vascular findings are present. No enlarged abdominal or pelvic lymph nodes. Reproductive: Status post hysterectomy. No adnexal masses. Other: No ascites, focal collection or pneumoperitoneum. Musculoskeletal: No acute or suspicious bony abnormalities identified. Degenerative changes in the LOWER lumbar spine identified. IMPRESSION: 1. Mild RIGHT hydroureteronephrosis caused by a probable 1.5 mm RIGHT UVJ calculus. 2. 6 mm LEFT renal angiomyolipoma Electronically Signed   By: Margarette Canada M.D.   On: 05/06/2018 16:37    Procedures Procedures (including critical care time)  Medications Ordered in ED Medications  morphine 4 MG/ML injection 4 mg (0 mg Intravenous Hold 05/06/18 1717)  ondansetron (ZOFRAN) injection 4 mg (4 mg Intravenous Given 05/06/18 1541)  fentaNYL (SUBLIMAZE) injection 100 mcg (100 mcg Intravenous Given 05/06/18 1554)  ketorolac (TORADOL) 15 MG/ML injection 15 mg (15 mg Intravenous Given 05/06/18 1715)     Initial Impression / Assessment and Plan / ED Course  I have reviewed the triage vital signs and the nursing notes.  Pertinent labs & imaging results that were available during my care of the patient were reviewed by me and considered in my medical decision making (see chart for details).     Patient with ureteral stone on right.  Labs reassuring otherwise.  Pain improved after treatment.  Will discharge home with urology follow-up.  Labs and imaging reviewed.  Final Clinical Impressions(s) / ED Diagnoses   Final diagnoses:  Right ureteral stone    ED Discharge Orders        Ordered    ondansetron (ZOFRAN-ODT) 4 MG disintegrating tablet  Every 8 hours PRN     05/06/18 1833    oxyCODONE-acetaminophen (PERCOCET/ROXICET) 5-325 MG tablet  Every 8 hours PRN      05/06/18 1833       Davonna Belling, MD 05/06/18 1847

## 2018-05-06 NOTE — ED Notes (Signed)
Pt given Rx x 2 for zofran and percocet. D/c home with her husband to drive. Urine strainer given with instructions for home use

## 2018-05-06 NOTE — ED Triage Notes (Signed)
Patient states that she has had right sided flank pain since 8 am this morning - the patient states that se is having having Nausea and vomiting  - denies any urinating issues

## 2018-05-06 NOTE — ED Notes (Signed)
ED Provider at bedside. 

## 2018-05-06 NOTE — ED Notes (Signed)
Pt in [place] 

## 2018-05-08 ENCOUNTER — Encounter: Payer: Self-pay | Admitting: Family Medicine

## 2018-10-10 ENCOUNTER — Encounter: Payer: Self-pay | Admitting: Family Medicine

## 2018-10-10 ENCOUNTER — Ambulatory Visit: Payer: Self-pay | Admitting: Family Medicine

## 2018-10-10 VITALS — BP 95/63 | HR 71 | Temp 98.7°F | Resp 16 | Ht 67.0 in | Wt 341.4 lb

## 2018-10-10 DIAGNOSIS — Z23 Encounter for immunization: Secondary | ICD-10-CM

## 2018-10-10 DIAGNOSIS — R7303 Prediabetes: Secondary | ICD-10-CM

## 2018-10-10 LAB — POCT GLYCOSYLATED HEMOGLOBIN (HGB A1C): HEMOGLOBIN A1C: 5.6 % (ref 4.0–5.6)

## 2018-10-10 MED ORDER — METFORMIN HCL 1000 MG PO TABS: ORAL_TABLET | ORAL | 3 refills | Status: DC

## 2018-10-10 NOTE — Progress Notes (Signed)
OFFICE VISIT  10/10/2018   CC:  Chief Complaint  Patient presents with  . Follow-up    [facility], pt is fasting.     HPI:    Patient is a 62 y.o. African-American female who presents for f/u prediabetes, mild persistent asthma, and morbid obesity.  About 10 d of coughing and wheezing, some nasal congestion, seems to be improving last 48h.  No albut use in last 1-2d.  Sx's better but not completely resolved.  Her QVAR expired but she has still been using it.  Glucoses: usually fastings are 115-130, highest 135. Exercise: none Diet: tries to limit carbs, limits portion sizes.   She is up 7 lbs compared to last o/v 6 mo ago.  She is in some financial stress lately, is self pay, is putting her house on the market and looking for a smaller and more affordable one.  She also mentioned seeking permanent disability.   Past Medical History:  Diagnosis Date  . BPPV (benign paroxysmal positional vertigo) 01/30/2014  . Cyst, breast 01/2012; 06/2012; 03/02/17; 02/04/92   Complicated cysts in upper outer left breast--no evidence of malignancy--f/u bilat diag mammo/left breast u/s on 03/06/15 showed resolution of left breast cysts.  Repeat screening mammogram 1 yr.  . H/O allergic rhinitis    Dr. Ishmael Holter  . History of pneumonia 2009 and 2010  . Mild persistent asthma, well controlled 2014   New adult onset asthma after respiratory infection (Dr. Ishmael Holter)  . Morbid obesity ([place])    BMI 50+.  At one point in time she was going to Bariatric clinic on [address] and got weekly HCG injections, monthly vit B12 injections, and took phentermine daily.  As of 05/2015 she was no longer doing this.  . Nephrolithiasis 04/2018 first episode   [facility] Ne[facility] 1.62mm right UVJ stone + 6 mm L AML.  Pt elected for trial of passage.  . Osteoarthritis of both knees    No improvement with steroid injections; did synvisc x 3 yrs; bilat replacement was recommended but she declined.  . Prediabetes 2016   05/2015 A1c was  6.3%  . Recurrent low back pain     Past Surgical History:  Procedure Laterality Date  . APPENDECTOMY  1978  . CARDIAC CATHETERIZATION  2010   Normal coronaries per pt  . CARDIOVASCULAR STRESS TEST  2010   abnl per pt; f/u cath clean  . CESAREAN SECTION     x 3  . CHOLECYSTECTOMY  1986  . COLONOSCOPY  2008   normal per pt report (in [place])  . LUMBAR DISC SURGERY  1993   L5/S1  . TONSILLECTOMY AND ADENOIDECTOMY  1967ish  . TOTAL ABDOMINAL HYSTERECTOMY  1994   Ovaries are still in.  This was done for DUB/fibroids.    Outpatient Medications Prior to Visit  Medication Sig Dispense Refill  . albuterol (PROVENTIL) (2.5 MG/3ML) 0.083% nebulizer solution Take 3 mLs (2.5 mg total) by nebulization every 6 (six) hours as needed for wheezing. 75 mL 1  . albuterol (VENTOLIN HFA) 108 (90 Base) MCG/ACT inhaler Inhale 2 puffs into the lungs every 4 (four) hours as needed for wheezing. 1 Inhaler 1  . BAYER MICROLET LANCETS lancets Use to check blood sugar once daily 100 each 11  . beclomethasone (QVAR) 80 MCG/ACT inhaler Inhale 2 puffs into the lungs 2 (two) times daily as needed. 1 Inhaler 3  . Cholecalciferol (VITAMIN D-3 PO) Take 1 tablet by mouth daily.    Marland Kitchen glucose blood (  CONTOUR NEXT TEST) test strip Use to check blood sugar once daily 100 each 11  . meloxicam (MOBIC) 7.5 MG tablet 1-2 tabs po qd prn arthritis pain 60 tablet 6  . metFORMIN (GLUCOPHAGE) 1000 MG tablet Take 1 tablet (1,000 mg total) by mouth 2 (two) times daily with a meal. 60 tablet 6  . Multiple Vitamin (MULTIVITAMIN) capsule Take 1 capsule by mouth daily.    . naproxen sodium (ALEVE) 220 MG tablet Take 220 mg by mouth daily as needed.    . ondansetron (ZOFRAN-ODT) 4 MG disintegrating tablet Take 1 tablet (4 mg total) by mouth every 8 (eight) hours as needed for nausea or vomiting. (Patient not taking: Reported on 10/10/2018) 8 tablet 0  . oxyCODONE-acetaminophen (PERCOCET/ROXICET) 5-325 MG tablet Take 1-2 tablets by mouth  every 8 (eight) hours as needed for severe pain. 14 tablet 0   No facility-administered medications prior to visit.     Allergies  Allergen Reactions  . Penicillins Nausea Only and Rash    ROS As per HPI  PE: Blood pressure 95/63, pulse 71, temperature 98.7 F (37.1 C), temperature source Oral, resp. rate 16, height 5\' 7"  (1.702 m), weight (!) 341 lb 6 oz (154.8 kg), SpO2 92 %. Gen: Alert, well appearing.  Patient is oriented to person, place, time, and situation. AFFECT: pleasant, lucid thought and speech. CV: RRR, no m/r/g.   LUNGS: CTA bilat, nonlabored resps, good aeration in all lung fields. EXT: no clubbing or cyanosis.  no edema.    LABS:  Lab Results  Component Value Date   TSH 1.09 06/20/2017   Lab Results  Component Value Date   WBC 15.0 (H) 05/06/2018   HGB 13.1 05/06/2018   HCT 39.4 05/06/2018   MCV 84.5 05/06/2018   PLT 294 05/06/2018   Lab Results  Component Value Date   CREATININE 0.99 05/06/2018   BUN 20 05/06/2018   NA 139 05/06/2018   K 4.2 05/06/2018   CL 109 05/06/2018   CO2 23 05/06/2018   Lab Results  Component Value Date   ALT 16 05/06/2018   AST 21 05/06/2018   ALKPHOS 63 05/06/2018   BILITOT 0.3 05/06/2018   Lab Results  Component Value Date   CHOL 199 06/20/2017   Lab Results  Component Value Date   HDL 84.00 06/20/2017   Lab Results  Component Value Date   LDLCALC 92 06/20/2017   Lab Results  Component Value Date   TRIG 116.0 06/20/2017   Lab Results  Component Value Date   CHOLHDL 2 06/20/2017   Lab Results  Component Value Date   HGBA1C 5.6 03/14/2018   POC Hba1c = 5.6%  IMPRESSION AND PLAN:  1) Prediabetes: A1c in office today 5.6%. Decrease metformin to 1000mg  at [facility] only. Increase efforts at good diabetic diet and increased exercise.   2) Morbid obesity: she is still not doing very well with making any significant strides towards good exercise habits or wt loss efforts with diet.    3) Mild  persistent asthma: recent mild flare, resolving.  Continue QVAR qd and albut HFA q4h prn. Signs/symptoms to call or return for were reviewed and pt expressed understanding.  An After Visit Summary was printed and given to the patient.  FOLLOW UP: No follow-ups on file.  Signed:  Crissie Sickles, MD           10/10/2018

## 2018-10-15 ENCOUNTER — Ambulatory Visit: Payer: Self-pay | Admitting: Family Medicine

## 2018-10-15 ENCOUNTER — Telehealth: Payer: Self-pay | Admitting: Family Medicine

## 2018-10-15 ENCOUNTER — Ambulatory Visit: Payer: Self-pay

## 2018-10-15 ENCOUNTER — Encounter: Payer: Self-pay | Admitting: Family Medicine

## 2018-10-15 VITALS — BP 118/71 | HR 102 | Temp 99.9°F | Resp 16 | Ht 67.0 in | Wt 341.0 lb

## 2018-10-15 DIAGNOSIS — J4531 Mild persistent asthma with (acute) exacerbation: Secondary | ICD-10-CM

## 2018-10-15 MED ORDER — PREDNISONE 20 MG PO TABS: ORAL_TABLET | ORAL | 0 refills | Status: DC

## 2018-10-15 MED ORDER — IPRATROPIUM-ALBUTEROL 0.5-2.5 (3) MG/3ML IN SOLN
3.0000 mL | Freq: Once | RESPIRATORY_TRACT | Status: AC
  Administered 2018-10-15: 3 mL via RESPIRATORY_TRACT

## 2018-10-15 MED ORDER — METFORMIN HCL 1000 MG PO TABS: ORAL_TABLET | ORAL | 3 refills | Status: DC

## 2018-10-15 MED ORDER — ALBUTEROL SULFATE (2.5 MG/3ML) 0.083% IN NEBU: 2.5000 mg | INHALATION_SOLUTION | Freq: Four times a day (QID) | RESPIRATORY_TRACT | 1 refills | Status: DC | PRN

## 2018-10-15 MED ORDER — BECLOMETHASONE DIPROP HFA 80 MCG/ACT IN AERB: 2.0000 | INHALATION_SPRAY | Freq: Two times a day (BID) | RESPIRATORY_TRACT | 11 refills | Status: DC

## 2018-10-15 MED ORDER — METFORMIN HCL 1000 MG PO TABS: ORAL_TABLET | ORAL | 6 refills | Status: DC

## 2018-10-15 MED ORDER — AZITHROMYCIN 250 MG PO TABS: ORAL_TABLET | ORAL | 0 refills | Status: DC

## 2018-10-15 MED ORDER — ALBUTEROL SULFATE HFA 108 (90 BASE) MCG/ACT IN AERS
2.0000 | INHALATION_SPRAY | RESPIRATORY_TRACT | 1 refills | Status: DC | PRN
Start: 2018-10-15 — End: 2019-08-23

## 2018-10-15 NOTE — Telephone Encounter (Signed)
Pt c/o cough, runny nose, wheezing and chest pain with deep breathing. Pt denies fever but stated she was having night sweats. Pt stated her symptoms have become progressively worse since getting her Flu shot on 10/10/18. Pt stated that she is using inhalers for her wheezing and is coughing up a green colored sputum. Pt stated that she is having moderate difficulty breathing, but pt was able to talk in full sentences and could not detect shortness of breath at rest. Pt stated she thinks she has an upper respiratory infection.  Care advice given and pt verbalized understanding. Appointment given for today at 1:30 pm.  Reason for Disposition . [1] MILD difficulty breathing (e.g., minimal/no SOB at rest, SOB with walking, pulse <100) AND [2] NEW-onset or WORSE than normal  Answer Assessment - Initial Assessment Questions 1. RESPIRATORY STATUS: "Describe your breathing?" (e.g., wheezing, shortness of breath, unable to speak, severe coughing)      Wheezing, severe coughing 2. ONSET: "When did this breathing problem begin?"      Wednesday 3. PATTERN "Does the difficult breathing come and go, or has it been constant since it started?"      Progressively worse 4. SEVERITY: "How bad is your breathing?" (e.g., mild, moderate, severe)    - MILD: No SOB at rest, mild SOB with walking, speaks normally in sentences, can lay down, no retractions, pulse < 100.    - MODERATE: SOB at rest, SOB with minimal exertion and prefers to sit, cannot lie down flat, speaks in phrases, mild retractions, audible wheezing, pulse 100-120.    - SEVERE: Very SOB at rest, speaks in single words, struggling to breathe, sitting hunched forward, retractions, pulse > 120      moderate 5. RECURRENT SYMPTOM: "Have you had difficulty breathing before?" If so, ask: "When was the last time?" and "What happened that time?"      Yes  6. CARDIAC HISTORY: "Do you have any history of heart disease?" (e.g., heart attack, angina, bypass surgery,  angioplasty)      no 7. LUNG HISTORY: "Do you have any history of lung disease?"  (e.g., pulmonary embolus, asthma, emphysema)     Allergy induced asthma 8. CAUSE: "What do you think is causing the breathing problem?"      Upper respiratory infection 9. OTHER SYMPTOMS: "Do you have any other symptoms? (e.g., dizziness, runny nose, cough, chest pain, fever)     Runny nose, cough, chest pain, night sweats 10. PREGNANCY: "Is there any chance you are pregnant?" "When was your last menstrual period?"       n/a 11. TRAVEL: "Have you traveled out of the country in the last month?" (e.g., travel history, exposures)       no  Protocols used: BREATHING DIFFICULTY-A-AH

## 2018-10-15 NOTE — Telephone Encounter (Signed)
Copied from [place] [id number]. Topic: Quick Communication - Rx Refill/Question >> Oct 15, 2018  3:10 PM Gardiner Ramus wrote: Medication: metFORMIN (GLUCOPHAGE) 1000 MG tablet [[id number]]  pt called and stated that rx was incorrect. Pt states that she needs to be taking 2 a day with a 60 quantity. Please advise. Pt would now like medication sent to [place], [place] - [phone] (Phone) [phone] (Fax)

## 2018-10-15 NOTE — Telephone Encounter (Signed)
OK, rx corrected and sent to [facility].

## 2018-10-15 NOTE — Telephone Encounter (Signed)
Tried calling-line busy.

## 2018-10-15 NOTE — Progress Notes (Signed)
OFFICE VISIT  10/15/2018   CC:  Chief Complaint  Patient presents with  . Chest Congestion    SHOB, HA, back pain    HPI:    Patient is a 62 y.o. African-American female with mild persistent asthma who presents for respiratory symptoms. Pt has had a little over 2 wks of coughing and wheezing, some nasal congestion, getting worse the last week-->now SOB. Has been taking QVAR but it is expired.  Her albuterol is also expired.   Two nights of subjective fevers.  Pain in chest when coughing.  Drinking lots of water.  Solids intake is down.  Last week at her DM f/u visit we decreased her metformin to 1000mg  qhs b/c A1c was 5.6%. She has gotten some glucoses recently during this illness in the 150-160 range and is apprehensive so she wants to go back on the metformin 1000mg  bid.   Past Medical History:  Diagnosis Date  . BPPV (benign paroxysmal positional vertigo) 01/30/2014  . Cyst, breast 01/2012; 06/2012; 05/03/92; 05/01/00   Complicated cysts in upper outer left breast--no evidence of malignancy--f/u bilat diag mammo/left breast u/s on 03/06/15 showed resolution of left breast cysts.  Repeat screening mammogram 1 yr.  . H/O allergic rhinitis    Dr. Ishmael Holter  . History of pneumonia 2009 and 2010  . Mild persistent asthma, well controlled 2014   New adult onset asthma after respiratory infection (Dr. Ishmael Holter)  . Morbid obesity ([place])    BMI 50+.  At one point in time she was going to Bariatric clinic on [address] and got weekly HCG injections, monthly vit B12 injections, and took phentermine daily.  As of 05/2015 she was no longer doing this.  . Nephrolithiasis 04/2018 first episode   [facility] ([facility] 1.41mm right UVJ stone + 6 mm L AML.  Pt elected for trial of passage.  . Osteoarthritis of both knees    No improvement with steroid injections; did synvisc x 3 yrs; bilat replacement was recommended but she declined.  . Prediabetes 2016   05/2015 A1c was 6.3%  . Recurrent low back pain      Past Surgical History:  Procedure Laterality Date  . APPENDECTOMY  1978  . CARDIAC CATHETERIZATION  2010   Normal coronaries per pt  . CARDIOVASCULAR STRESS TEST  2010   abnl per pt; f/u cath clean  . CESAREAN SECTION     x 3  . CHOLECYSTECTOMY  1986  . COLONOSCOPY  2008   normal per pt report (in [place])  . LUMBAR DISC SURGERY  1993   L5/S1  . TONSILLECTOMY AND ADENOIDECTOMY  1967ish  . TOTAL ABDOMINAL HYSTERECTOMY  1994   Ovaries are still in.  This was done for DUB/fibroids.    Outpatient Medications Prior to Visit  Medication Sig Dispense Refill  . BAYER MICROLET LANCETS lancets Use to check blood sugar once daily 100 each 11  . beclomethasone (QVAR) 80 MCG/ACT inhaler Inhale 2 puffs into the lungs 2 (two) times daily as needed. 1 Inhaler 3  . Cholecalciferol (VITAMIN D-3 PO) Take 1 tablet by mouth daily.    Marland Kitchen glucose blood (CONTOUR NEXT TEST) test strip Use to check blood sugar once daily 100 each 11  . meloxicam (MOBIC) 7.5 MG tablet 1-2 tabs po qd prn arthritis pain 60 tablet 6  . Multiple Vitamin (MULTIVITAMIN) capsule Take 1 capsule by mouth daily.    . naproxen sodium (ALEVE) 220 MG tablet Take 220 mg by mouth daily as  needed.    Marland Kitchen albuterol (VENTOLIN HFA) 108 (90 Base) MCG/ACT inhaler Inhale 2 puffs into the lungs every 4 (four) hours as needed for wheezing. 1 Inhaler 1  . metFORMIN (GLUCOPHAGE) 1000 MG tablet 1 tab po with evening meal 30 tablet 3  . albuterol (PROVENTIL) (2.5 MG/3ML) 0.083% nebulizer solution Take 3 mLs (2.5 mg total) by nebulization every 6 (six) hours as needed for wheezing. (Patient not taking: Reported on 10/15/2018) 75 mL 1   No facility-administered medications prior to visit.     Allergies  Allergen Reactions  . Penicillins Nausea Only and Rash    ROS As per HPI  PE: Blood pressure 118/71, pulse (!) 102, temperature 99.9 F (37.7 C), temperature source Oral, resp. rate 16, height 5\' 7"  (1.702 m), weight (!) 341 lb (154.7 kg), SpO2 93  %. VS: noted. Gen: alert, NAD, NONTOXIC APPEARING. HEENT: eyes without injection, drainage, or swelling.  Ears: EACs clear, TMs with normal light reflex and landmarks.  Nose: Clear rhinorrhea, with some dried, crusty exudate adherent to mildly injected mucosa.  No purulent d/c.  No paranasal sinus TTP.  No facial swelling.  Throat and mouth without focal lesion.  No pharyngial swelling, erythema, or exudate.   Neck: supple, no LAD.   LUNGS: CTA bilat on insp, trace end exp wheezing, diminished BS throughout chest, mildly labored resps, RR 40/min.  RR decreased to 25 and aeration improved significantly after 1 alb/atr neb in office.   CV: Regular, mild tachycardia, no m/r/g. EXT: no c/c/e SKIN: no rash    LABS:    Chemistry      Component Value Date/Time   NA 139 05/06/2018 1527   K 4.2 05/06/2018 1527   CL 109 05/06/2018 1527   CO2 23 05/06/2018 1527   BUN 20 05/06/2018 1527   CREATININE 0.99 05/06/2018 1527      Component Value Date/Time   CALCIUM 9.3 05/06/2018 1527   ALKPHOS 63 05/06/2018 1527   AST 21 05/06/2018 1527   ALT 16 05/06/2018 1527   BILITOT 0.3 05/06/2018 1527     Lab Results  Component Value Date   HGBA1C 5.6 10/10/2018    IMPRESSION AND PLAN:  1) URI with acute exacerbation of mild persistent asthma: inhalers out of date lately. Alb/atr neb in office today--> subjective improvement in breathing, increased aeration diffusely, RR increased to 120. Prednisone taper--see orders. Albuterol neb sol'n (she has neb machine at home) and HFA, QVAR HFA to restart. Z pack.  An After Visit Summary was printed and given to the patient.  FOLLOW UP: Return for 2-3 d f/u asthma.  Signed:  Crissie Sickles, MD           10/15/2018

## 2018-10-18 ENCOUNTER — Ambulatory Visit: Payer: Self-pay | Admitting: Family Medicine

## 2018-10-18 NOTE — Telephone Encounter (Signed)
Called pharmacy to see if pt p/u Rx, pt did.

## 2018-10-19 ENCOUNTER — Inpatient Hospital Stay (HOSPITAL_BASED_OUTPATIENT_CLINIC_OR_DEPARTMENT_OTHER)
Admission: EM | Admit: 2018-10-19 | Discharge: 2018-11-01 | DRG: 853 | Disposition: A | Discharge status: Home Health | Payer: Self-pay | Attending: Internal Medicine | Admitting: Internal Medicine

## 2018-10-19 ENCOUNTER — Emergency Department (HOSPITAL_BASED_OUTPATIENT_CLINIC_OR_DEPARTMENT_OTHER): Payer: Self-pay

## 2018-10-19 ENCOUNTER — Encounter (HOSPITAL_BASED_OUTPATIENT_CLINIC_OR_DEPARTMENT_OTHER): Payer: Self-pay | Admitting: Emergency Medicine

## 2018-10-19 ENCOUNTER — Other Ambulatory Visit: Payer: Self-pay

## 2018-10-19 DIAGNOSIS — R0600 Dyspnea, unspecified: Secondary | ICD-10-CM

## 2018-10-19 DIAGNOSIS — J9601 Acute respiratory failure with hypoxia: Secondary | ICD-10-CM | POA: Diagnosis present

## 2018-10-19 DIAGNOSIS — T8131XA Disruption of external operation (surgical) wound, not elsewhere classified, initial encounter: Secondary | ICD-10-CM | POA: Diagnosis not present

## 2018-10-19 DIAGNOSIS — Z87891 Personal history of nicotine dependence: Secondary | ICD-10-CM

## 2018-10-19 DIAGNOSIS — Z4659 Encounter for fitting and adjustment of other gastrointestinal appliance and device: Secondary | ICD-10-CM

## 2018-10-19 DIAGNOSIS — Z833 Family history of diabetes mellitus: Secondary | ICD-10-CM

## 2018-10-19 DIAGNOSIS — Z880 Allergy status to penicillin: Secondary | ICD-10-CM

## 2018-10-19 DIAGNOSIS — J9690 Respiratory failure, unspecified, unspecified whether with hypoxia or hypercapnia: Secondary | ICD-10-CM

## 2018-10-19 DIAGNOSIS — R072 Precordial pain: Secondary | ICD-10-CM

## 2018-10-19 DIAGNOSIS — E1169 Type 2 diabetes mellitus with other specified complication: Secondary | ICD-10-CM | POA: Diagnosis present

## 2018-10-19 DIAGNOSIS — Z978 Presence of other specified devices: Secondary | ICD-10-CM

## 2018-10-19 DIAGNOSIS — Y848 Other medical procedures as the cause of abnormal reaction of the patient, or of later complication, without mention of misadventure at the time of the procedure: Secondary | ICD-10-CM | POA: Diagnosis not present

## 2018-10-19 DIAGNOSIS — J181 Lobar pneumonia, unspecified organism: Secondary | ICD-10-CM | POA: Diagnosis present

## 2018-10-19 DIAGNOSIS — J918 Pleural effusion in other conditions classified elsewhere: Secondary | ICD-10-CM | POA: Diagnosis present

## 2018-10-19 DIAGNOSIS — M1710 Unilateral primary osteoarthritis, unspecified knee: Secondary | ICD-10-CM | POA: Diagnosis present

## 2018-10-19 DIAGNOSIS — J45909 Unspecified asthma, uncomplicated: Secondary | ICD-10-CM | POA: Diagnosis present

## 2018-10-19 DIAGNOSIS — D62 Acute posthemorrhagic anemia: Secondary | ICD-10-CM | POA: Diagnosis present

## 2018-10-19 DIAGNOSIS — Z7984 Long term (current) use of oral hypoglycemic drugs: Secondary | ICD-10-CM

## 2018-10-19 DIAGNOSIS — Z90710 Acquired absence of both cervix and uterus: Secondary | ICD-10-CM

## 2018-10-19 DIAGNOSIS — R0902 Hypoxemia: Secondary | ICD-10-CM

## 2018-10-19 DIAGNOSIS — E662 Morbid (severe) obesity with alveolar hypoventilation: Secondary | ICD-10-CM | POA: Diagnosis present

## 2018-10-19 DIAGNOSIS — A408 Other streptococcal sepsis: Principal | ICD-10-CM | POA: Diagnosis present

## 2018-10-19 DIAGNOSIS — E43 Unspecified severe protein-calorie malnutrition: Secondary | ICD-10-CM | POA: Diagnosis present

## 2018-10-19 DIAGNOSIS — Z8701 Personal history of pneumonia (recurrent): Secondary | ICD-10-CM

## 2018-10-19 DIAGNOSIS — J869 Pyothorax without fistula: Secondary | ICD-10-CM | POA: Diagnosis present

## 2018-10-19 DIAGNOSIS — Z6841 Body Mass Index (BMI) 40.0 and over, adult: Secondary | ICD-10-CM

## 2018-10-19 DIAGNOSIS — Z9889 Other specified postprocedural states: Secondary | ICD-10-CM

## 2018-10-19 DIAGNOSIS — J4531 Mild persistent asthma with (acute) exacerbation: Secondary | ICD-10-CM | POA: Diagnosis present

## 2018-10-19 DIAGNOSIS — M179 Osteoarthritis of knee, unspecified: Secondary | ICD-10-CM | POA: Diagnosis present

## 2018-10-19 DIAGNOSIS — Z9049 Acquired absence of other specified parts of digestive tract: Secondary | ICD-10-CM

## 2018-10-19 DIAGNOSIS — Z7952 Long term (current) use of systemic steroids: Secondary | ICD-10-CM

## 2018-10-19 DIAGNOSIS — Z09 Encounter for follow-up examination after completed treatment for conditions other than malignant neoplasm: Secondary | ICD-10-CM

## 2018-10-19 DIAGNOSIS — E785 Hyperlipidemia, unspecified: Secondary | ICD-10-CM | POA: Diagnosis present

## 2018-10-19 DIAGNOSIS — J90 Pleural effusion, not elsewhere classified: Secondary | ICD-10-CM

## 2018-10-19 DIAGNOSIS — E1165 Type 2 diabetes mellitus with hyperglycemia: Secondary | ICD-10-CM | POA: Diagnosis present

## 2018-10-19 DIAGNOSIS — E876 Hypokalemia: Secondary | ICD-10-CM | POA: Diagnosis present

## 2018-10-19 DIAGNOSIS — R0602 Shortness of breath: Secondary | ICD-10-CM

## 2018-10-19 DIAGNOSIS — Z79899 Other long term (current) drug therapy: Secondary | ICD-10-CM

## 2018-10-19 DIAGNOSIS — Z9689 Presence of other specified functional implants: Secondary | ICD-10-CM

## 2018-10-19 DIAGNOSIS — J189 Pneumonia, unspecified organism: Secondary | ICD-10-CM | POA: Diagnosis present

## 2018-10-19 HISTORY — DX: Type 2 diabetes mellitus without complications: E11.9

## 2018-10-19 LAB — BRAIN NATRIURETIC PEPTIDE: B NATRIURETIC PEPTIDE 5: 176.7 pg/mL — AB (ref 0.0–100.0)

## 2018-10-19 LAB — CBC
HEMATOCRIT: 39 % (ref 36.0–46.0)
Hemoglobin: 11.8 g/dL — ABNORMAL LOW (ref 12.0–15.0)
MCH: 26.5 pg (ref 26.0–34.0)
MCHC: 30.3 g/dL (ref 30.0–36.0)
MCV: 87.4 fL (ref 80.0–100.0)
NRBC: 0 % (ref 0.0–0.2)
Platelets: 538 10*3/uL — ABNORMAL HIGH (ref 150–400)
RBC: 4.46 MIL/uL (ref 3.87–5.11)
RDW: 15 % (ref 11.5–15.5)
WBC: 16.3 10*3/uL — AB (ref 4.0–10.5)

## 2018-10-19 LAB — BASIC METABOLIC PANEL
ANION GAP: 10 (ref 5–15)
BUN: 15 mg/dL (ref 8–23)
CHLORIDE: 107 mmol/L (ref 98–111)
CO2: 25 mmol/L (ref 22–32)
Calcium: 9.1 mg/dL (ref 8.9–10.3)
Creatinine, Ser: 0.88 mg/dL (ref 0.44–1.00)
GFR calc non Af Amer: >60 mL/min (ref >60)
Glucose, Bld: 133 mg/dL — ABNORMAL HIGH (ref 70–99)
Potassium: 3.3 mmol/L — ABNORMAL LOW (ref 3.5–5.1)
SODIUM: 142 mmol/L (ref 135–145)

## 2018-10-19 LAB — D-DIMER, QUANTITATIVE (NOT AT ARMC): D DIMER QUANT: 2.07 ug{FEU}/mL — AB (ref 0.00–0.50)

## 2018-10-19 LAB — GLUCOSE, CAPILLARY
Glucose-Capillary: 184 mg/dL — ABNORMAL HIGH (ref 70–99)
Glucose-Capillary: 194 mg/dL — ABNORMAL HIGH (ref 70–99)

## 2018-10-19 LAB — TROPONIN I: Troponin I: <0.03 ng/mL (ref <0.03)

## 2018-10-19 MED ORDER — ALBUTEROL SULFATE (2.5 MG/3ML) 0.083% IN NEBU: 2.5000 mg | INHALATION_SOLUTION | RESPIRATORY_TRACT | Status: DC | PRN

## 2018-10-19 MED ORDER — AZITHROMYCIN 500 MG IV SOLR
INTRAVENOUS | Status: AC
  Filled 2018-10-19: qty 500

## 2018-10-19 MED ORDER — ENOXAPARIN SODIUM 80 MG/0.8ML ~~LOC~~ SOLN
80.0000 mg | SUBCUTANEOUS | Status: DC
  Administered 2018-10-20: 80 mg via SUBCUTANEOUS
  Filled 2018-10-19: qty 0.8

## 2018-10-19 MED ORDER — INSULIN ASPART 100 UNIT/ML ~~LOC~~ SOLN
0.0000 [IU] | Freq: Three times a day (TID) | SUBCUTANEOUS | Status: DC
  Administered 2018-10-19: 2 [IU] via SUBCUTANEOUS
  Administered 2018-10-20: 3 [IU] via SUBCUTANEOUS

## 2018-10-19 MED ORDER — SODIUM CHLORIDE 0.9 % IV SOLN
2.0000 g | INTRAVENOUS | Status: DC
  Administered 2018-10-20 – 2018-10-23 (×4): 2 g via INTRAVENOUS
  Filled 2018-10-19 (×3): qty 2
  Filled 2018-10-19: qty 20
  Filled 2018-10-19: qty 2

## 2018-10-19 MED ORDER — ALBUTEROL SULFATE (2.5 MG/3ML) 0.083% IN NEBU: 2.5000 mg | INHALATION_SOLUTION | Freq: Four times a day (QID) | RESPIRATORY_TRACT | Status: DC | PRN

## 2018-10-19 MED ORDER — ONDANSETRON HCL 4 MG/2ML IJ SOLN
INTRAMUSCULAR | Status: AC
  Administered 2018-10-19: 4 mg via INTRAVENOUS
  Filled 2018-10-19: qty 2

## 2018-10-19 MED ORDER — IOPAMIDOL (ISOVUE-370) INJECTION 76%
100.0000 mL | Freq: Once | INTRAVENOUS | Status: AC | PRN
  Administered 2018-10-19: 92 mL via INTRAVENOUS

## 2018-10-19 MED ORDER — HYDROMORPHONE HCL 1 MG/ML IJ SOLN
1.0000 mg | Freq: Once | INTRAMUSCULAR | Status: AC
  Administered 2018-10-19: 1 mg via INTRAVENOUS
  Filled 2018-10-19: qty 1

## 2018-10-19 MED ORDER — ENOXAPARIN SODIUM 40 MG/0.4ML ~~LOC~~ SOLN
40.0000 mg | SUBCUTANEOUS | Status: DC
  Administered 2018-10-19: 40 mg via SUBCUTANEOUS
  Filled 2018-10-19: qty 0.4

## 2018-10-19 MED ORDER — SODIUM CHLORIDE 0.9 % IV SOLN
INTRAVENOUS | Status: DC | PRN
  Administered 2018-10-19: 1000 mL via INTRAVENOUS
  Administered 2018-10-20: 250 mL via INTRAVENOUS

## 2018-10-19 MED ORDER — MORPHINE SULFATE (PF) 4 MG/ML IV SOLN
4.0000 mg | Freq: Once | INTRAVENOUS | Status: AC
  Administered 2018-10-19: 4 mg via INTRAVENOUS
  Filled 2018-10-19: qty 1

## 2018-10-19 MED ORDER — FENTANYL CITRATE (PF) 100 MCG/2ML IJ SOLN
INTRAMUSCULAR | Status: AC
  Administered 2018-10-19: 50 ug via INTRAVENOUS
  Filled 2018-10-19: qty 2

## 2018-10-19 MED ORDER — SACCHAROMYCES BOULARDII 250 MG PO CAPS
250.0000 mg | ORAL_CAPSULE | Freq: Two times a day (BID) | ORAL | Status: DC
  Administered 2018-10-19 – 2018-10-24 (×11): 250 mg via ORAL
  Filled 2018-10-19 (×11): qty 1

## 2018-10-19 MED ORDER — CEFTRIAXONE SODIUM 2 G IJ SOLR
2.0000 g | INTRAMUSCULAR | Status: DC
  Administered 2018-10-19: 2 g via INTRAVENOUS
  Filled 2018-10-19: qty 20

## 2018-10-19 MED ORDER — OXYCODONE HCL 5 MG PO TABS
10.0000 mg | ORAL_TABLET | ORAL | Status: DC | PRN
  Filled 2018-10-19: qty 2

## 2018-10-19 MED ORDER — HYDROMORPHONE HCL 1 MG/ML IJ SOLN
1.0000 mg | INTRAMUSCULAR | Status: DC | PRN
  Administered 2018-10-19 – 2018-10-20 (×7): 1 mg via INTRAVENOUS
  Filled 2018-10-19 (×7): qty 1

## 2018-10-19 MED ORDER — SODIUM CHLORIDE 0.9 % IV SOLN
500.0000 mg | INTRAVENOUS | Status: DC
  Administered 2018-10-20 – 2018-10-22 (×3): 500 mg via INTRAVENOUS
  Filled 2018-10-19 (×3): qty 500

## 2018-10-19 MED ORDER — FENTANYL CITRATE (PF) 100 MCG/2ML IJ SOLN
50.0000 ug | Freq: Once | INTRAMUSCULAR | Status: AC
  Administered 2018-10-19: 50 ug via INTRAVENOUS

## 2018-10-19 MED ORDER — METHYLPREDNISOLONE SODIUM SUCC 40 MG IJ SOLR
40.0000 mg | Freq: Two times a day (BID) | INTRAMUSCULAR | Status: DC
  Administered 2018-10-19 – 2018-10-20 (×2): 40 mg via INTRAVENOUS
  Filled 2018-10-19 (×2): qty 1

## 2018-10-19 MED ORDER — HYDROMORPHONE HCL 1 MG/ML IJ SOLN
2.0000 mg | Freq: Once | INTRAMUSCULAR | Status: AC
  Administered 2018-10-19: 2 mg via INTRAVENOUS
  Filled 2018-10-19: qty 2

## 2018-10-19 MED ORDER — SODIUM CHLORIDE 0.9 % IV SOLN
500.0000 mg | INTRAVENOUS | Status: DC
  Administered 2018-10-19: 500 mg via INTRAVENOUS
  Filled 2018-10-19: qty 500

## 2018-10-19 MED ORDER — ONDANSETRON HCL 4 MG/2ML IJ SOLN
4.0000 mg | Freq: Once | INTRAMUSCULAR | Status: AC
Start: 2018-10-19 — End: 2018-10-19
  Administered 2018-10-19: 4 mg via INTRAVENOUS

## 2018-10-19 NOTE — Plan of Care (Signed)
Pt becomes SOB when ambulating to BR. Pt also not steady on her feet d/t pain and SOB>

## 2018-10-19 NOTE — ED Provider Notes (Signed)
Emergency Department Provider Note   I have reviewed the triage vital signs and the nursing notes.   HISTORY  Chief Complaint Chest Pain   HPI Taylor Leon is a 62 y.o. female with PMH of DM, asthma, and elevated BMI resents to the emergency department for evaluation of acute onset left posterior chest pain shortness of breath.  The patient states she is being treated as an outpatient for upper respiratory tract infection and asthma exacerbation by her PCP.  She is completing a steroid course and using her nebulizer at home.  She states her asthma symptoms have improved along with her URI symptoms.  Approximately 1 hour prior to arrival the patient developed sudden onset posterior chest wall pain with shortness of breath and presented to the emergency department.  There is no radiation of symptoms or other modifying factors.  Denies any fevers or chills. Pain is severe and constant.    Past Medical History:  Diagnosis Date  . BPPV (benign paroxysmal positional vertigo) 01/30/2014  . Cyst, breast 01/2012; 06/2012; 07/30/45; 02/01/02   Complicated cysts in upper outer left breast--no evidence of malignancy--f/u bilat diag mammo/left breast u/s on 03/06/15 showed resolution of left breast cysts.  Repeat screening mammogram 1 yr.  . Diabetes mellitus without complication ([facility])    Pre-diabetes  . H/O allergic rhinitis    Dr. Ishmael Holter  . History of pneumonia 2009 and 2010  . Mild persistent asthma, well controlled 2014   New adult onset asthma after respiratory infection (Dr. Ishmael Holter)  . Morbid obesity ([place])    BMI 50+.  At one point in time she was going to Bariatric clinic on [address] and got weekly HCG injections, monthly vit B12 injections, and took phentermine daily.  As of 05/2015 she was no longer doing this.  . Nephrolithiasis 04/2018 first episode   [facility]ighb[facility] ([facility]): 1.44mm right UVJ stone + 6 mm L AML.  Pt elected for trial of passage.  . Osteoarthritis of both knees    No  improvement with steroid injections; did synvisc x 3 yrs; bilat replacement was recommended but she declined.  . Prediabetes 2016   05/2015 A1c was 6.3%  . Recurrent low back pain     Patient Active Problem List   Diagnosis Date Noted  . CAP (community acquired pneumonia) 10/19/2018  . Prediabetes 12/26/2014  . Health maintenance examination 01/30/2014  . BPPV (benign paroxysmal positional vertigo) 01/30/2014  . Allergic rhinitis 08/29/2013  . DJD (degenerative joint disease) of knee 09/10/2012  . Asthmatic bronchitis 03/28/2012    Past Surgical History:  Procedure Laterality Date  . APPENDECTOMY  1978  . CARDIAC CATHETERIZATION  2010   Normal coronaries per pt  . CARDIOVASCULAR STRESS TEST  2010   abnl per pt; f/u cath clean  . CESAREAN SECTION     x 3  . CHOLECYSTECTOMY  1986  . COLONOSCOPY  2008   normal per pt report (in [place])  . LUMBAR DISC SURGERY  1993   L5/S1  . TONSILLECTOMY AND ADENOIDECTOMY  1967ish  . TOTAL ABDOMINAL HYSTERECTOMY  1994   Ovaries are still in.  This was done for DUB/fibroids.    Allergies Penicillins  Family History  Problem Relation Age of Onset  . Arthritis Mother   . Heart disease Mother 47  . Hypertension Mother   . Diabetes Mother   . Arthritis Paternal Grandmother     Social History Social History   Tobacco Use  . Smoking status: Former  Smoker    Types: Cigarettes    Last attempt to quit: 12/28/2009    Years since quitting: 8.8  . Smokeless tobacco: Never Used  Substance Use Topics  . Alcohol use: Yes    Comment: social  . Drug use: No    Review of Systems  Constitutional: No fever/chills Eyes: No visual changes. ENT: No sore throat. Cardiovascular: Positive chest pain. Respiratory: Positive shortness of breath. Gastrointestinal: No abdominal pain.  No nausea, no vomiting.  No diarrhea.  No constipation. Genitourinary: Negative for dysuria. Musculoskeletal: Negative for back pain. Skin: Negative for  rash. Neurological: Negative for headaches, focal weakness or numbness.  10-point ROS otherwise negative.  ____________________________________________   PHYSICAL EXAM:  VITAL SIGNS: ED Triage Vitals  Enc Vitals Group     BP 10/19/18 0735 (!) 152/139     Pulse Rate 10/19/18 0732 67     Resp 10/19/18 0732 17     Temp 10/19/18 0738 98.3 F (36.8 C)     Temp src --      SpO2 10/19/18 0732 (!) 89 %     Weight 10/19/18 0751 (!) 341 lb (154.7 kg)     Height 10/19/18 0751 5\' 7"  (1.702 m)   Constitutional: Alert and oriented. Appears uncomfortable.  Eyes: Conjunctivae are normal.  Head: Atraumatic. Nose: No congestion/rhinnorhea. Mouth/Throat: Mucous membranes are moist.  Oropharynx non-erythematous. Neck: No stridor.   Cardiovascular: Normal rate, regular rhythm. Good peripheral circulation. Grossly normal heart sounds.   Respiratory: Increased respiratory effort.  No retractions. Lungs diminished bilaterally. No significant wheezing.  Gastrointestinal: Soft and nontender. No distention.  Musculoskeletal: No lower extremity tenderness nor edema. No gross deformities of extremities. Tenderness over the left posterior chest wall.  Neurologic:  Normal speech and language. No gross focal neurologic deficits are appreciated.  Skin:  Skin is warm, dry and intact. No rash noted.  ____________________________________________   LABS (all labs ordered are listed, but only abnormal results are displayed)  Labs Reviewed  BASIC METABOLIC PANEL - Abnormal; Notable for the following components:      Result Value   Potassium 3.3 (*)    Glucose, Bld 133 (*)    All other components within normal limits  CBC - Abnormal; Notable for the following components:   WBC 16.3 (*)    Hemoglobin 11.8 (*)    Platelets 538 (*)    All other components within normal limits  D-DIMER, QUANTITATIVE (NOT AT [facility]) - Abnormal; Notable for the following components:   D-Dimer, Quant 2.07 (*)    All other  components within normal limits  BRAIN NATRIURETIC PEPTIDE - Abnormal; Notable for the following components:   B Natriuretic Peptide 176.7 (*)    All other components within normal limits  TROPONIN I  HIV ANTIBODY (ROUTINE TESTING W REFLEX)   ____________________________________________  EKG   EKG Interpretation  Date/Time:  Friday October 19 2018 07:33:05 EDT Ventricular Rate:  67 PR Interval:    QRS Duration: 95 QT Interval:  408 QTC Calculation: 431 R Axis:   58 Text Interpretation:  Sinus rhythm Low voltage, precordial leads Baseline wander in lead(s) V1 No STEMI.  Confirmed by Nanda Quinton [phone]) on 10/19/2018 7:46:12 AM       ____________________________________________  RADIOLOGY  Dg Chest 2 View  Result Date: 10/19/2018 CLINICAL DATA:  Left-sided back pain and shortness of breath EXAM: CHEST - 2 VIEW COMPARISON:  01/30/2014 FINDINGS: Hazy left lower lobe airspace disease concerning for pneumonia. No pleural effusion or pneumothorax. Stable  cardiomediastinal silhouette. No acute osseous abnormality. IMPRESSION: Left lower lobe pneumonia. Followup PA and lateral chest X-ray is recommended in 3-4 weeks following trial of antibiotic therapy to ensure resolution and exclude underlying malignancy. Electronically Signed   By: Kathreen Devoid   On: 10/19/2018 08:47   Ct Angio Chest Pe W And/or Wo Contrast  Result Date: 10/19/2018 CLINICAL DATA:  62 year old female with shortness of breath and left scapular pain worse with inspiration. D-dimer is elevated. Evaluate for pulmonary embolus. EXAM: CT ANGIOGRAPHY CHEST WITH CONTRAST TECHNIQUE: Multidetector CT imaging of the chest was performed using the standard protocol during bolus administration of intravenous contrast. Multiplanar CT image reconstructions and MIPs were obtained to evaluate the vascular anatomy. CONTRAST:  73mL ISOVUE-370 IOPAMIDOL (ISOVUE-370) INJECTION 76% COMPARISON:  Chest x-ray obtained earlier today; prior CT  scan of the abdomen and pelvis including lung bases 05/06/2018 FINDINGS: Cardiovascular: Except below opacifications of the pulmonary arteries to the proximal segmental level. The main pulmonary artery is mildly enlarged at 3.2 cm. No evidence of central pulmonary embolus. Mild cardiomegaly with right atrial enlargement. Normal caliber aorta. No pericardial effusion. Normal pulmonary venous drainage pattern. Mediastinum/Nodes: Unremarkable CT appearance of the thyroid gland. No suspicious mediastinal or hilar adenopathy. No soft tissue mediastinal mass. The thoracic esophagus is unremarkable. Lungs/Pleura: Small layering left pleural effusion without evidence of loculation or enhancing pleural rind. While there is some associated left lower lobe atelectasis, the degree of atelectasis the is greater than expected and there is also some patchy airspace opacity in a peribronchovascular distribution more centrally within the left lower lobe. Overall, these findings suggest left lower lobe bronchopneumonia with associated parapneumonic effusion. Minimal dependent atelectasis also present in the right lower lobe. No emphysematous changes. No suspicious pulmonary nodule or mass. Upper Abdomen: Circumscribed water attenuation lesions in the liver most consistent with simple hepatic cysts. Similar findings were evident on prior CT imaging of the abdomen and pelvis. No acute abnormality within the visualized upper abdomen. Musculoskeletal: No acute fracture or aggressive appearing lytic or blastic osseous lesion. Review of the MIP images confirms the above findings. IMPRESSION: 1. Left lower lobe broncho pneumonia with small likely parapneumonic effusion. No CT evidence to suggest empyema. 2. No evidence of acute pulmonary embolus to the lobar level. 3. Enlarged main pulmonary artery and right atrial dilatation suggesting underlying pulmonary arterial hypertension with elevated right heart pressures. Electronically Signed    By: Jacqulynn Cadet M.D.   On: 10/19/2018 09:50    ____________________________________________   PROCEDURES  Procedure(s) performed:   Procedures  CRITICAL CARE Performed by: Margette Fast Total critical care time: 35 minutes Critical care time was exclusive of separately billable procedures and treating other patients. Critical care was necessary to treat or prevent imminent or life-threatening deterioration. Critical care was time spent personally by me on the following activities: development of treatment plan with patient and/or surrogate as well as nursing, discussions with consultants, evaluation of patient's response to treatment, examination of patient, obtaining history from patient or surrogate, ordering and performing treatments and interventions, ordering and review of laboratory studies, ordering and review of radiographic studies, pulse oximetry and re-evaluation of patient's condition.  Nanda Quinton, MD Emergency Medicine  ____________________________________________   INITIAL IMPRESSION / ASSESSMENT AND PLAN / ED COURSE  Pertinent labs & imaging results that were available during my care of the patient were reviewed by me and considered in my medical decision making (see chart for details).  Patient presents to the emergency department with acute  onset left posterior chest pain and shortness of breath.  She had hypoxemia on arrival and appears to be in some distress.  Patient with equal breath sounds bilaterally with no significant wheezing.  Differential remains broad at this time. EKG with no acute ischemic findings.   Differential includes all life-threatening causes for chest pain. This includes but is not exclusive to acute coronary syndrome, aortic dissection, pulmonary embolism, cardiac tamponade, community-acquired pneumonia, pericarditis, musculoskeletal chest wall pain, etc.  10:26 AM Chest x-ray showing left lower lobe infiltrate.  Given the patient's  sudden onset symptoms I feel it necessary to rule out PE with possible pulmonary infarct causing opacity on chest x-ray. CTA ordered which confirmed pneumonia with small parapneumonic effusion.  No pulmonary embolism on CT.  Plan for continued pain control and oxygen support.  Patient currently on 4 L nasal cannula.  Labs show leukocytosis but no SIRS criteria at this time. Doubt sepsis.   Discussed patient's case with Hospitalist, Dr. Rodena Piety to request admission. Patient and family (if present) updated with plan. Care transferred to Hospitalist service.  I reviewed all nursing notes, vitals, pertinent old records, EKGs, labs, imaging (as available).   ____________________________________________  FINAL CLINICAL IMPRESSION(S) / ED DIAGNOSES  Final diagnoses:  Community acquired pneumonia of left lower lobe of lung ([place])  Hypoxemia  Precordial chest pain     MEDICATIONS GIVEN DURING THIS VISIT:  Medications  cefTRIAXone (ROCEPHIN) 2 g in sodium chloride 0.9 % 100 mL IVPB (2 g Intravenous New Bag/Given 10/19/18 0945)  azithromycin (ZITHROMAX) 500 mg in sodium chloride 0.9 % 250 mL IVPB (500 mg Intravenous New Bag/Given 10/19/18 1026)  HYDROmorphone (DILAUDID) injection 1 mg (1 mg Intravenous Given 10/19/18 0950)  0.9 %  sodium chloride infusion (1,000 mLs Intravenous New Bag/Given 10/19/18 0942)  enoxaparin (LOVENOX) injection 40 mg (has no administration in time range)  ondansetron (ZOFRAN) injection 4 mg (4 mg Intravenous Given 10/19/18 0748)  fentaNYL (SUBLIMAZE) injection 50 mcg (50 mcg Intravenous Given 10/19/18 0748)  morphine 4 MG/ML injection 4 mg (4 mg Intravenous Given 10/19/18 0812)  HYDROmorphone (DILAUDID) injection 1 mg (1 mg Intravenous Given 10/19/18 0843)  iopamidol (ISOVUE-370) 76 % injection 100 mL (92 mLs Intravenous Contrast Given 10/19/18 0909)  azithromycin (ZITHROMAX) 500 MG injection (  Return to [facility] 10/19/18 0941)    Note:  This document was prepared  using Dragon voice recognition software and may include unintentional dictation errors.  Nanda Quinton, MD Emergency Medicine    Long, [facility], MD 10/19/18 1028

## 2018-10-19 NOTE — ED Triage Notes (Addendum)
Per husband: Piercing chest pain radiating to left shoulder woke up patient approximately 6 am.  Pt sob.  Diaphoretic.

## 2018-10-19 NOTE — ED Notes (Signed)
ED Provider at bedside. 

## 2018-10-19 NOTE — H&P (Signed)
History and Physical    Taylor Leon SWF:[id number] DOB: 05-12-1956 DOA: 10/19/2018  PCP: Tammi Sou, MD  Patient coming from: Home    Chief Complaint: Shortness of breath and cough and chest pain  HPI: Taylor Leon is a 62 y.o. female with medical history significant of asthma, type 2 diabetes diet-controlled, morbid obesity transferred from [place].  Patient complains of acute onset of left-sided chest pain with shortness of breath started this morning.  This is associated with cough and phlegm yellow color.  She had fever and chills at home however she did not record her temperature.  Patient was being treated for URI asthma exacerbation by her PCP with Z-Pak and prednisone.  This is not improved her symptoms.  She denies any nausea vomiting abdominal pain.  Does complain of some on and off diarrhea.  Denies any hematuria or urinary complaints.  She describes her chest pain as pleuritic in nature. ED Course: Patient received azithromycin and Rocephin.  Pulse ox was 88% on room air upon arrival to the ER and up to 4 L and saturation improved in the high 90s.  A CT angiogram done showed no evidence of pulmonary embolism.  Chest x-ray showed left lower lobe pneumonia.  CT chest also showed parapneumonic effusion no evidence of empyema no evidence of PE enlarged main pulmonary artery suggesting pulmonary arterial hypertension.  Review of Systems: See HPI Past Medical History:  Diagnosis Date  . BPPV (benign paroxysmal positional vertigo) 01/30/2014  . Cyst, breast 01/2012; 06/2012; 01/27/01; 04/28/26   Complicated cysts in upper outer left breast--no evidence of malignancy--f/u bilat diag mammo/left breast u/s on 03/06/15 showed resolution of left breast cysts.  Repeat screening mammogram 1 yr.  . Diabetes mellitus without complication (HCC)    Pre-diabetes  . H/O allergic rhinitis    Dr. Ishmael Holter  . History of pneumonia 2009 and 2010  . Mild persistent asthma, well controlled 2014   New  adult onset asthma after respiratory infection (Dr. Ishmael Holter)  . Morbid obesity ([place])    BMI 50+.  At one point in time she was going to Bariatric clinic on [address] and got weekly HCG injections, monthly vit B12 injections, and took phentermine daily.  As of 05/2015 she was no longer doing this.  . Nephrolithiasis 04/2018 first episode   Dr. Lovena Neighbours ([facility]): 1.42mm right UVJ stone + 6 mm L AML.  Pt elected for trial of passage.  . Osteoarthritis of both knees    No improvement with steroid injections; did synvisc x 3 yrs; bilat replacement was recommended but she declined.  . Prediabetes 2016   05/2015 A1c was 6.3%  . Recurrent low back pain     Past Surgical History:  Procedure Laterality Date  . APPENDECTOMY  1978  . CARDIAC CATHETERIZATION  2010   Normal coronaries per pt  . CARDIOVASCULAR STRESS TEST  2010   abnl per pt; f/u cath clean  . CESAREAN SECTION     x 3  . CHOLECYSTECTOMY  1986  . COLONOSCOPY  2008   normal per pt report (in [place])  . LUMBAR DISC SURGERY  1993   L5/S1  . TONSILLECTOMY AND ADENOIDECTOMY  1967ish  . TOTAL ABDOMINAL HYSTERECTOMY  1994   Ovaries are still in.  This was done for DUB/fibroids.     reports that she quit smoking about 8 years ago. Her smoking use included cigarettes. She has never used smokeless tobacco. She reports that she drinks alcohol.  She reports that she does not use drugs.  Allergies  Allergen Reactions  . Penicillins Nausea Only and Rash    Has patient had a PCN reaction causing immediate rash, facial/tongue/throat swelling, SOB or lightheadedness with hypotension: yES Has patient had a PCN reaction causing severe rash involving mucus membranes or skin necrosis: No Has patient had a PCN reaction that required hospitalization: No Has patient had a PCN reaction occurring within the last 10 years: No If all of the above answers are "NO", then may proceed with Cephalosporin use. CC    Family History  Problem Relation Age of  Onset  . Arthritis Mother   . Heart disease Mother 6  . Hypertension Mother   . Diabetes Mother   . Arthritis Paternal Grandmother     Prior to Admission medications   Medication Sig Start Date End Date Taking? Authorizing Provider  albuterol (PROVENTIL) (2.5 MG/3ML) 0.083% nebulizer solution Take 3 mLs (2.5 mg total) by nebulization every 6 (six) hours as needed for wheezing. 10/15/18  Yes McGowen, Adrian Blackwater, MD  albuterol (VENTOLIN HFA) 108 (90 Base) MCG/ACT inhaler Inhale 2 puffs into the lungs every 4 (four) hours as needed for wheezing. 10/15/18 12/19/20 Yes McGowen, Adrian Blackwater, MD  ascorbic acid (VITAMIN C) 1000 MG tablet Take 1,000 mg by mouth daily.   Yes [provider]  azithromycin (ZITHROMAX) 250 MG tablet 2 tabs po qd x 1d, then 1 tab po qd x 4d 10/15/18  Yes McGowen, Adrian Blackwater, MD  BAYER MICROLET LANCETS lancets Use to check blood sugar once daily 12/13/17  Yes McGowen, Adrian Blackwater, MD  Cholecalciferol (VITAMIN D-3 PO) Take 1 tablet by mouth daily.   Yes [provider]  glucose blood (CONTOUR NEXT TEST) test strip Use to check blood sugar once daily 12/13/17  Yes McGowen, Adrian Blackwater, MD  meloxicam (MOBIC) 7.5 MG tablet 1-2 tabs po qd prn arthritis pain 11/02/17  Yes McGowen, Adrian Blackwater, MD  metFORMIN (GLUCOPHAGE) 1000 MG tablet 1 tab po bid with meals 10/15/18  Yes McGowen, Adrian Blackwater, MD  Misc Natural Products (TURMERIC CURCUMIN) CAPS Take 1 capsule by mouth 3 (three) times a week. Monday, Wednesday, and Friday.   Yes [provider]  Multiple Vitamin (MULTIVITAMIN) capsule Take 1 capsule by mouth daily.   Yes [provider]  naproxen sodium (ALEVE) 220 MG tablet Take 220 mg by mouth daily as needed (arthritis pain.).    Yes [provider]  predniSONE (DELTASONE) 20 MG tablet 3 tabs po qd x 2d, then 2 tabs po qd x 4d, then 1 tab po qd x 3d, then 1/2 tab po qd x 2d 10/15/18  Yes McGowen, Adrian Blackwater, MD  beclomethasone (QVAR REDIHALER) 80 MCG/ACT  inhaler Inhale 2 puffs into the lungs 2 (two) times daily. Patient not taking: Reported on 10/19/2018 10/15/18   Tammi Sou, MD  beclomethasone (QVAR) 80 MCG/ACT inhaler Inhale 2 puffs into the lungs 2 (two) times daily as needed. Patient not taking: Reported on 10/19/2018 12/06/16   Tammi Sou, MD    Physical Exam: Vitals:   10/19/18 1100 10/19/18 1115 10/19/18 1227 10/19/18 1233  BP: 101/88 97/62 (!) 120/57   Pulse: 77 75 65   Resp: 16 (!) 28 20   Temp:      SpO2: 98% 98% 97%   Weight:    (!) 155.2 kg  Height:    5\' 7"  (1.702 m)    Constitutional: Patient in mild distress due to  left-sided pleuritic chest pain. Vitals:   10/19/18 1100 10/19/18 1115 10/19/18 1227 10/19/18 1233  BP: 101/88 97/62 (!) 120/57   Pulse: 77 75 65   Resp: 16 (!) 28 20   Temp:      SpO2: 98% 98% 97%   Weight:    (!) 155.2 kg  Height:    5\' 7"  (1.702 m)   Eyes: PERRL, lids and conjunctivae normal ENMT: Mucous membranes are moist. Posterior pharynx clear of any exudate or lesions.Normal dentition.  Neck: normal, supple, no masses, no thyromegaly Respiratory:  Wheezing and rhonchi left more than right auscultation bilaterally, no wheezing, no crackles. Normal respiratory effort. No accessory muscle use.  Cardiovascular: Regular rate and rhythm, no murmurs / rubs / gallops. No extremity edema. 2+ pedal pulses. No carotid bruits.  Abdomen: no tenderness, no masses palpated. No hepatosplenomegaly. Bowel sounds positive.  Musculoskeletal: no clubbing / cyanosis. No joint deformity upper and lower extremities. Good ROM, no contractures. Normal muscle tone.  Skin: no rashes, lesions, ulcers. No induration Neurologic: CN 2-12 grossly intact. Sensation intact, DTR normal. Strength 5/5 in all 4.  Psychiatric: Normal judgment and insight. Alert and oriented x 3. Normal mood.   Labs on Admission: I have personally reviewed following labs and imaging studies  CBC: Recent Labs  Lab 10/19/18 0737    WBC 16.3*  HGB 11.8*  HCT 39.0  MCV 87.4  PLT 109*   Basic Metabolic Panel: Recent Labs  Lab 10/19/18 0737  NA 142  K 3.3*  CL 107  CO2 25  GLUCOSE 133*  BUN 15  CREATININE 0.88  CALCIUM 9.1   GFR: Estimated Creatinine Clearance: 103.6 mL/min (by C-G formula based on SCr of 0.88 mg/dL). Liver Function Tests: No results for input(s): AST, ALT, ALKPHOS, BILITOT, PROT, ALBUMIN in the last 168 hours. No results for input(s): LIPASE, AMYLASE in the last 168 hours. No results for input(s): AMMONIA in the last 168 hours. Coagulation Profile: No results for input(s): INR, PROTIME in the last 168 hours. Cardiac Enzymes: Recent Labs  Lab 10/19/18 0737  TROPONINI <0.03   BNP (last 3 results) No results for input(s): PROBNP in the last 8760 hours. HbA1C: No results for input(s): HGBA1C in the last 72 hours. CBG: No results for input(s): GLUCAP in the last 168 hours. Lipid Profile: No results for input(s): CHOL, HDL, LDLCALC, TRIG, CHOLHDL, LDLDIRECT in the last 72 hours. Thyroid Function Tests: No results for input(s): TSH, T4TOTAL, FREET4, T3FREE, THYROIDAB in the last 72 hours. Anemia Panel: No results for input(s): VITAMINB12, FOLATE, FERRITIN, TIBC, IRON, RETICCTPCT in the last 72 hours. Urine analysis:    Component Value Date/Time   COLORURINE YELLOW 05/06/2018 1517   APPEARANCEUR CLEAR 05/06/2018 1517   LABSPEC >1.030 (H) 05/06/2018 1517   PHURINE 5.5 05/06/2018 1517   GLUCOSEU NEGATIVE 05/06/2018 1517   HGBUR TRACE (A) 05/06/2018 1517   BILIRUBINUR NEGATIVE 05/06/2018 1517   BILIRUBINUR Negative 12/13/2017 0851   KETONESUR NEGATIVE 05/06/2018 1517   PROTEINUR NEGATIVE 05/06/2018 1517   UROBILINOGEN 0.2 12/13/2017 0851   NITRITE NEGATIVE 05/06/2018 1517   LEUKOCYTESUR NEGATIVE 05/06/2018 1517    Radiological Exams on Admission: Dg Chest 2 View  Result Date: 10/19/2018 CLINICAL DATA:  Left-sided back pain and shortness of breath EXAM: CHEST - 2 VIEW  COMPARISON:  01/30/2014 FINDINGS: Hazy left lower lobe airspace disease concerning for pneumonia. No pleural effusion or pneumothorax. Stable cardiomediastinal silhouette. No acute osseous abnormality. IMPRESSION: Left lower lobe pneumonia. Followup PA and lateral  chest X-ray is recommended in 3-4 weeks following trial of antibiotic therapy to ensure resolution and exclude underlying malignancy. Electronically Signed   By: Kathreen Devoid   On: 10/19/2018 08:47   Ct Angio Chest Pe W And/or Wo Contrast  Result Date: 10/19/2018 CLINICAL DATA:  62 year old female with shortness of breath and left scapular pain worse with inspiration. D-dimer is elevated. Evaluate for pulmonary embolus. EXAM: CT ANGIOGRAPHY CHEST WITH CONTRAST TECHNIQUE: Multidetector CT imaging of the chest was performed using the standard protocol during bolus administration of intravenous contrast. Multiplanar CT image reconstructions and MIPs were obtained to evaluate the vascular anatomy. CONTRAST:  23mL ISOVUE-370 IOPAMIDOL (ISOVUE-370) INJECTION 76% COMPARISON:  Chest x-ray obtained earlier today; prior CT scan of the abdomen and pelvis including lung bases 05/06/2018 FINDINGS: Cardiovascular: Except below opacifications of the pulmonary arteries to the proximal segmental level. The main pulmonary artery is mildly enlarged at 3.2 cm. No evidence of central pulmonary embolus. Mild cardiomegaly with right atrial enlargement. Normal caliber aorta. No pericardial effusion. Normal pulmonary venous drainage pattern. Mediastinum/Nodes: Unremarkable CT appearance of the thyroid gland. No suspicious mediastinal or hilar adenopathy. No soft tissue mediastinal mass. The thoracic esophagus is unremarkable. Lungs/Pleura: Small layering left pleural effusion without evidence of loculation or enhancing pleural rind. While there is some associated left lower lobe atelectasis, the degree of atelectasis the is greater than expected and there is also some patchy  airspace opacity in a peribronchovascular distribution more centrally within the left lower lobe. Overall, these findings suggest left lower lobe bronchopneumonia with associated parapneumonic effusion. Minimal dependent atelectasis also present in the right lower lobe. No emphysematous changes. No suspicious pulmonary nodule or mass. Upper Abdomen: Circumscribed water attenuation lesions in the liver most consistent with simple hepatic cysts. Similar findings were evident on prior CT imaging of the abdomen and pelvis. No acute abnormality within the visualized upper abdomen. Musculoskeletal: No acute fracture or aggressive appearing lytic or blastic osseous lesion. Review of the MIP images confirms the above findings. IMPRESSION: 1. Left lower lobe broncho pneumonia with small likely parapneumonic effusion. No CT evidence to suggest empyema. 2. No evidence of acute pulmonary embolus to the lobar level. 3. Enlarged main pulmonary artery and right atrial dilatation suggesting underlying pulmonary arterial hypertension with elevated right heart pressures. Electronically Signed   By: Jacqulynn Cadet M.D.   On: 10/19/2018 09:50    Assessment/Plan Active Problems:   CAP (community acquired pneumonia) #1 community-acquired pneumonia with exacerbation of asthma failed outpatient treatment.  Patient admitted with ongoing left-sided pleuritic chest pain and shortness of breath and cough productive of phlegm.  CT scan and x-ray findings consistent with pneumonia left lower lobe and parapneumonic effusion.  We will treated with IV azithromycin and Rocephin and IV steroids taper steroids as possible.  Her white count was 16.3 upon admission to the hospital  #2 hypokalemia with a K of 3.3 replete and recheck renal functions normal.  3]type 2 dm takes metformin at home.will place on ssi  DVT prophylaxis:lovenox Code Status: full Family Communication: dw husband Disposition Plan pending clinical  improvement Consults called: none Admission status:  Georgette Shell MD [facility]  If 7PM-7AM, please contact night-coverage [email or web address] Password [email or web address]  10/19/2018, 2:03 PM

## 2018-10-19 NOTE — Progress Notes (Signed)
Received patient to [id number] via [facility], VS obtained, telemetry monitor applied, oriented to unit, call light placed in reach

## 2018-10-19 NOTE — ED Notes (Signed)
Patient transported to CT 

## 2018-10-20 DIAGNOSIS — E876 Hypokalemia: Secondary | ICD-10-CM

## 2018-10-20 LAB — GLUCOSE, CAPILLARY
GLUCOSE-CAPILLARY: 119 mg/dL — AB (ref 70–99)
Glucose-Capillary: 112 mg/dL — ABNORMAL HIGH (ref 70–99)
Glucose-Capillary: 208 mg/dL — ABNORMAL HIGH (ref 70–99)
Glucose-Capillary: 169 mg/dL — ABNORMAL HIGH (ref 70–99)

## 2018-10-20 LAB — HIV ANTIBODY (ROUTINE TESTING W REFLEX): HIV SCREEN 4TH GENERATION: NONREACTIVE

## 2018-10-20 MED ORDER — IPRATROPIUM-ALBUTEROL 0.5-2.5 (3) MG/3ML IN SOLN
3.0000 mL | Freq: Four times a day (QID) | RESPIRATORY_TRACT | Status: DC
  Administered 2018-10-20 (×2): 3 mL via RESPIRATORY_TRACT
  Filled 2018-10-20 (×2): qty 3

## 2018-10-20 MED ORDER — IPRATROPIUM-ALBUTEROL 0.5-2.5 (3) MG/3ML IN SOLN
3.0000 mL | Freq: Three times a day (TID) | RESPIRATORY_TRACT | Status: DC
  Administered 2018-10-21 – 2018-10-24 (×7): 3 mL via RESPIRATORY_TRACT
  Filled 2018-10-20 (×9): qty 3

## 2018-10-20 MED ORDER — POTASSIUM CHLORIDE CRYS ER 20 MEQ PO TBCR
40.0000 meq | EXTENDED_RELEASE_TABLET | ORAL | Status: AC
  Administered 2018-10-20 (×2): 40 meq via ORAL
  Filled 2018-10-20 (×2): qty 2

## 2018-10-20 MED ORDER — SODIUM CHLORIDE 0.9 % IV SOLN
INTRAVENOUS | Status: DC
  Administered 2018-10-20 – 2018-10-21 (×3): via INTRAVENOUS

## 2018-10-20 MED ORDER — GUAIFENESIN-DM 100-10 MG/5ML PO SYRP
5.0000 mL | ORAL_SOLUTION | ORAL | Status: DC | PRN
  Administered 2018-10-22 – 2018-10-23 (×2): 5 mL via ORAL
  Filled 2018-10-20: qty 5
  Filled 2018-10-20: qty 10

## 2018-10-20 MED ORDER — ACETAMINOPHEN 325 MG PO TABS
650.0000 mg | ORAL_TABLET | Freq: Four times a day (QID) | ORAL | Status: DC | PRN
  Administered 2018-10-20 – 2018-10-21 (×2): 650 mg via ORAL
  Filled 2018-10-20 (×3): qty 2

## 2018-10-20 MED ORDER — IPRATROPIUM-ALBUTEROL 0.5-2.5 (3) MG/3ML IN SOLN
3.0000 mL | RESPIRATORY_TRACT | Status: DC | PRN
  Administered 2018-10-22: 3 mL via RESPIRATORY_TRACT
  Filled 2018-10-20: qty 3

## 2018-10-20 MED ORDER — IBUPROFEN 200 MG PO TABS
400.0000 mg | ORAL_TABLET | Freq: Three times a day (TID) | ORAL | Status: DC
  Administered 2018-10-20 – 2018-10-21 (×3): 400 mg via ORAL
  Filled 2018-10-20 (×3): qty 2

## 2018-10-20 NOTE — Progress Notes (Signed)
PROGRESS NOTE    Taylor Leon  ZOX:[id number] DOB: February 26, 1956 DOA: 10/19/2018 PCP: Tammi Sou, MD    Brief Narrative:  62 year old female who presented with dyspnea, cough and chest pain.  She does have the significant past medical history for asthma, type 2 diabetes mellitus,0and morbid obesity.  Reported acute onset of left-sided chest pain associated with dyspnea, and productive cough.  At home had fevers, she was seen in the outpatient clinic, prescribed prednisone and a Z-Pak with no improvement of her symptoms.  On her initial physical examination blood pressure 97/62, heart rate 75, respiratory rate 28, oxygen saturation 98%, lungs with positive wheezing and rhonchi predominantly on the left side, heart S1-S2 present and rhythmic, abdomen soft nontender, no lower extremity edema.  Sodium 142, potassium 3.3, chloride 107, bicarb 25, glucose 133, BUN 15, creatinine 0.8, white count 16.3, hemoglobin 9.8, hematocrit 39.0, platelets 538.  D-dimer 2.0.  Chest x-ray with left lower lobe infiltrate, associated with small pleural effusion.  CT chest confirming infiltrate in the left lower lobe, small parapneumonic pleural effusion, no pulmonary embolism.  EKG with normal sinus rhythm normal axis, normal intervals.  Patient was admitted to the hospital with working diagnosis of sepsis due to left lower lobe pneumonia.    Assessment & Plan:   Active Problems:   CAP (community acquired pneumonia)   Hypoxemia   Precordial chest pain   1. Pneumonia, left lower lobe community acquired, complicated with sepsis (present on admission) and left parapneumonic pleural effusion. Will continue antibiotic therapy with IV ceftriaxone and IV azithromycin. Patient has been afebrile, wbc at 16,3, will follow on blood cultures and sputum cultures, will add legionella and streptococcal pneumonia antigens. Will continue gentle IV fluids with isotonic saline. Will hold on steroids for now and will add  bronchodilator therapy with duoneb. Positive pleuritic chest pain, discontinue narcotics, will add ibuprofen and acetaminophen. Positive d dimer, negative CT chest for PE, will check [place] lower extremities.   2. W1XB complicated with hyperglycemia. Capillary glucose 208, will continue insulin sliding scale for glucose cover and monitoring, discontinue systemic steroids for now.   3. Hypokalemia. Will continue K correction with Kcl, serum K today down to 3,3 with serum cr at 0,88, will follow on renal panel in am, avoid hypotension and nephrotoxic medications.   4. Morbid obesity. Calculated BMI 53.8. Patient at home with independent ambulation. Will follow with physical therapy evaluation.   DVT prophylaxis: heparin   Code Status: full Family Communication: I spoke with patient's husband at the bedside and all questions were addressed. ' Disposition Plan/ discharge barriers:  Pending clinical improvement   Consultants:     Procedures:     Antimicrobials:   Ceftriaxone   Azithromycin     Subjective: Patient continue to have left pleuritic chest pain, moderate to severe in intensity, worse with movement, no radiation, mild improvement with analgesics.   Objective: Vitals:   10/19/18 1227 10/19/18 1233 10/19/18 2121 10/20/18 0544  BP: (!) 120/57  116/63 (!) 116/56  Pulse: 65  83 66  Resp: 20   18  Temp:   99.6 F (37.6 C) 98.3 F (36.8 C)  TempSrc:   Oral Oral  SpO2: 97%  95% 97%  Weight:  (!) 155.2 kg    Height:  5\' 7"  (1.702 m)      Intake/Output Summary (Last 24 hours) at 10/20/2018 1158 Last data filed at 10/20/2018 0818 Gross per 24 hour  Intake 358.08 ml  Output 400 ml  Net -  41.92 ml   Filed Weights   10/19/18 0751 10/19/18 1233  Weight: (!) 154.7 kg (!) 155.2 kg    Examination:   General: deconditioned and ill looking appearing  Neurology: Awake and alert, non focal  E ENT: mild pallor, no icterus, oral mucosa moist Cardiovascular: No JVD. S1-S2  present, rhythmic, no gallops, rubs, or murmurs. No lower extremity edema. Pulmonary: decreased breath sounds bilaterally, poor air movement, no wheezing, scattered rhonchi and left basilar rales. Gastrointestinal. Abdomen protuberant no organomegaly, non tender, no rebound or guarding Skin. No rashes Musculoskeletal: no joint deformities     Data Reviewed: I have personally reviewed following labs and imaging studies  CBC: Recent Labs  Lab 10/19/18 0737  WBC 16.3*  HGB 11.8*  HCT 39.0  MCV 87.4  PLT 601*   Basic Metabolic Panel: Recent Labs  Lab 10/19/18 0737  NA 142  K 3.3*  CL 107  CO2 25  GLUCOSE 133*  BUN 15  CREATININE 0.88  CALCIUM 9.1   GFR: Estimated Creatinine Clearance: 103.6 mL/min (by C-G formula based on SCr of 0.88 mg/dL). Liver Function Tests: No results for input(s): AST, ALT, ALKPHOS, BILITOT, PROT, ALBUMIN in the last 168 hours. No results for input(s): LIPASE, AMYLASE in the last 168 hours. No results for input(s): AMMONIA in the last 168 hours. Coagulation Profile: No results for input(s): INR, PROTIME in the last 168 hours. Cardiac Enzymes: Recent Labs  Lab 10/19/18 0737  TROPONINI <0.03   BNP (last 3 results) No results for input(s): PROBNP in the last 8760 hours. HbA1C: No results for input(s): HGBA1C in the last 72 hours. CBG: Recent Labs  Lab 10/19/18 1656 10/19/18 2121 10/20/18 0725 10/20/18 1133  GLUCAP 184* 194* 112* 208*   Lipid Profile: No results for input(s): CHOL, HDL, LDLCALC, TRIG, CHOLHDL, LDLDIRECT in the last 72 hours. Thyroid Function Tests: No results for input(s): TSH, T4TOTAL, FREET4, T3FREE, THYROIDAB in the last 72 hours. Anemia Panel: No results for input(s): VITAMINB12, FOLATE, FERRITIN, TIBC, IRON, RETICCTPCT in the last 72 hours.    Radiology Studies: I have reviewed all of the imaging during this hospital visit personally     Scheduled Meds: . enoxaparin (LOVENOX) injection  80 mg  Subcutaneous Q24H  . insulin aspart  0-9 Units Subcutaneous TID WC  . methylPREDNISolone (SOLU-MEDROL) injection  40 mg Intravenous Q12H  . saccharomyces boulardii  250 mg Oral BID   Continuous Infusions: . sodium chloride 250 mL (10/20/18 0810)  . azithromycin 500 mg (10/20/18 0814)  . cefTRIAXone (ROCEPHIN)  IV 2 g (10/20/18 0917)     LOS: 0 days        Tawni Millers, MD [facility] Pager [phone]

## 2018-10-21 ENCOUNTER — Inpatient Hospital Stay (HOSPITAL_COMMUNITY): Payer: Self-pay

## 2018-10-21 DIAGNOSIS — R7989 Other specified abnormal findings of blood chemistry: Secondary | ICD-10-CM

## 2018-10-21 DIAGNOSIS — E785 Hyperlipidemia, unspecified: Secondary | ICD-10-CM

## 2018-10-21 DIAGNOSIS — R0689 Other abnormalities of breathing: Secondary | ICD-10-CM

## 2018-10-21 DIAGNOSIS — E1165 Type 2 diabetes mellitus with hyperglycemia: Secondary | ICD-10-CM

## 2018-10-21 DIAGNOSIS — E1169 Type 2 diabetes mellitus with other specified complication: Secondary | ICD-10-CM

## 2018-10-21 LAB — CBC WITH DIFFERENTIAL/PLATELET
ABS IMMATURE GRANULOCYTES: 0.35 10*3/uL — AB (ref 0.00–0.07)
BASOS ABS: 0 10*3/uL (ref 0.0–0.1)
BASOS PCT: 0 %
EOS PCT: 0 %
Eosinophils Absolute: 0.1 10*3/uL (ref 0.0–0.5)
HCT: 37.8 % (ref 36.0–46.0)
Hemoglobin: 11.9 g/dL — ABNORMAL LOW (ref 12.0–15.0)
Immature Granulocytes: 2 %
Lymphocytes Relative: 16 %
Lymphs Abs: 3.6 10*3/uL (ref 0.7–4.0)
MCH: 26.6 pg (ref 26.0–34.0)
MCHC: 31.5 g/dL (ref 30.0–36.0)
MCV: 84.4 fL (ref 80.0–100.0)
MONO ABS: 1.6 10*3/uL — AB (ref 0.1–1.0)
Monocytes Relative: 7 %
NEUTROS ABS: 17.5 10*3/uL — AB (ref 1.7–7.7)
NRBC: 0 % (ref 0.0–0.2)
Neutrophils Relative %: 75 %
PLATELETS: 373 10*3/uL (ref 150–400)
RBC: 4.48 MIL/uL (ref 3.87–5.11)
RDW: 15.3 % (ref 11.5–15.5)
WBC: 23.2 10*3/uL — AB (ref 4.0–10.5)

## 2018-10-21 LAB — BASIC METABOLIC PANEL
Anion gap: 12 (ref 5–15)
BUN: 17 mg/dL (ref 8–23)
CO2: 21 mmol/L — ABNORMAL LOW (ref 22–32)
CREATININE: 0.81 mg/dL (ref 0.44–1.00)
Calcium: 8.7 mg/dL — ABNORMAL LOW (ref 8.9–10.3)
Chloride: 107 mmol/L (ref 98–111)
Glucose, Bld: 115 mg/dL — ABNORMAL HIGH (ref 70–99)
Potassium: 4.4 mmol/L (ref 3.5–5.1)
SODIUM: 140 mmol/L (ref 135–145)

## 2018-10-21 LAB — GLUCOSE, CAPILLARY
GLUCOSE-CAPILLARY: 104 mg/dL — AB (ref 70–99)
Glucose-Capillary: 110 mg/dL — ABNORMAL HIGH (ref 70–99)
Glucose-Capillary: 89 mg/dL (ref 70–99)
Glucose-Capillary: 121 mg/dL — ABNORMAL HIGH (ref 70–99)

## 2018-10-21 LAB — STREP PNEUMONIAE URINARY ANTIGEN: STREP PNEUMO URINARY ANTIGEN: NEGATIVE

## 2018-10-21 MED ORDER — ACETAMINOPHEN 325 MG PO TABS
650.0000 mg | ORAL_TABLET | Freq: Four times a day (QID) | ORAL | Status: DC
  Administered 2018-10-21 – 2018-10-23 (×10): 650 mg via ORAL
  Filled 2018-10-21 (×10): qty 2

## 2018-10-21 MED ORDER — KETOROLAC TROMETHAMINE 15 MG/ML IJ SOLN
15.0000 mg | Freq: Four times a day (QID) | INTRAMUSCULAR | Status: DC | PRN
  Administered 2018-10-21 – 2018-10-23 (×6): 15 mg via INTRAVENOUS
  Filled 2018-10-21 (×6): qty 1

## 2018-10-21 MED ORDER — LIDOCAINE 5 % EX PTCH
1.0000 | MEDICATED_PATCH | CUTANEOUS | Status: DC
  Administered 2018-10-21 – 2018-10-23 (×3): 1 via TRANSDERMAL
  Filled 2018-10-21 (×3): qty 1

## 2018-10-21 NOTE — Progress Notes (Signed)
PROGRESS NOTE    Taylor Leon  JTT:[id number] DOB: 03-20-1956 DOA: 10/19/2018 PCP: Tammi Sou, MD    Brief Narrative:  62 year old female who presented with dyspnea, cough and chest pain.  She does have the significant past medical history for asthma, type 2 diabetes mellitus,0and morbid obesity.  Reported acute onset of left-sided chest pain associated with dyspnea, and productive cough.  At home had fevers, she was seen in the outpatient clinic, prescribed prednisone and a Z-Pak with no improvement of her symptoms.  On her initial physical examination blood pressure 97/62, heart rate 75, respiratory rate 28, oxygen saturation 98%, lungs with positive wheezing and rhonchi predominantly on the left side, heart S1-S2 present and rhythmic, abdomen soft nontender, no lower extremity edema.  Sodium 142, potassium 3.3, chloride 107, bicarb 25, glucose 133, BUN 15, creatinine 0.8, white count 16.3, hemoglobin 9.8, hematocrit 39.0, platelets 538.  D-dimer 2.0.  Chest x-ray with left lower lobe infiltrate, associated with small pleural effusion.  CT chest confirming infiltrate in the left lower lobe, small parapneumonic pleural effusion, no pulmonary embolism.  EKG with normal sinus rhythm normal axis, normal intervals.  Patient was admitted to the hospital with working diagnosis of sepsis due to left lower lobe pneumonia.    Assessment & Plan:   Active Problems:   CAP (community acquired pneumonia)   Hypoxemia   Precordial chest pain  1. Pneumonia, left lower lobe community acquired, complicated with sepsis (present on admission) and left parapneumonic pleural effusion. Antibiotic therapy with IV ceftriaxone and IV azithromycin. Today with worsening leukocytosis, cultures have been no growth and patient has remained afebrile. Worsening persistent left sided pleuritic chest pain, will add IV ketorolac and will change acetaminophen to scheduled, patient continue to decline narcotic analgesics. Add  local lidocaine patch. Will decrease IV fluids to 50 ml per hour, following a restrictive IV fluids strategy, to prevent volume overload.   2. E0PQ complicated with hyperglycemia. Fasting glucose this am 115, will continue capillary glucose monitoring and insulin sliding scale for glucose cover. Steroids have been discontinued.   3. Hypokalemia. Potassium this am up to 4,4, with serum bicarbonate at 21, renal function continue to be preserved with serum cr at 0,81.  4. Morbid obesity. Calculated BMI 53.8. Chest pain is limiting movement.   DVT prophylaxis: heparin   Code Status: full Family Communication: no family at the bedside.  Disposition Plan/ discharge barriers:  Pending clinical improvement   Consultants:     Procedures:     Antimicrobials:   Ceftriaxone   Azithromycin    Subjective: Patient continue to have very severe left pleuritic chest pain, worse with movement and deep breathing, no radiation, associated with dyspnea, no improving factors. No nausea or vomiting, continue to decline narcotic analgesics.   Objective: Vitals:   10/20/18 1458 10/20/18 2054 10/20/18 2113 10/21/18 0607  BP:   118/66 114/75  Pulse:   (!) 101 76  Resp:   20 18  Temp:   98.3 F (36.8 C) 98.8 F (37.1 C)  TempSrc:   Oral Oral  SpO2: 95% 94% 96%   Weight:      Height:        Intake/Output Summary (Last 24 hours) at 10/21/2018 1144 Last data filed at 10/21/2018 1036 Gross per 24 hour  Intake 1705.04 ml  Output 1600 ml  Net 105.04 ml   Filed Weights   10/19/18 0751 10/19/18 1233  Weight: (!) 154.7 kg (!) 155.2 kg    Examination:  General: deconditioned and in pain.  Neurology: Awake and alert, non focal  E ENT: mild pallor, no icterus, oral mucosa moist Cardiovascular: No JVD. S1-S2 present, rhythmic, no gallops, rubs, or murmurs. Trace lower extremity edema. Pulmonary: decreased breath sounds bilaterally, poor air movement, no wheezing, or rhonchi,  positive left base rales. Gastrointestinal. Abdomen protuberant with no organomegaly, non tender, no rebound or guarding Skin. No rashes Musculoskeletal: no joint deformities     Data Reviewed: I have personally reviewed following labs and imaging studies  CBC: Recent Labs  Lab 10/19/18 0737 10/21/18 0455  WBC 16.3* 23.2*  NEUTROABS  --  17.5*  HGB 11.8* 11.9*  HCT 39.0 37.8  MCV 87.4 84.4  PLT 538* 779   Basic Metabolic Panel: Recent Labs  Lab 10/19/18 0737 10/21/18 0455  NA 142 140  K 3.3* 4.4  CL 107 107  CO2 25 21*  GLUCOSE 133* 115*  BUN 15 17  CREATININE 0.88 0.81  CALCIUM 9.1 8.7*   GFR: Estimated Creatinine Clearance: 112.5 mL/min (by C-G formula based on SCr of 0.81 mg/dL). Liver Function Tests: No results for input(s): AST, ALT, ALKPHOS, BILITOT, PROT, ALBUMIN in the last 168 hours. No results for input(s): LIPASE, AMYLASE in the last 168 hours. No results for input(s): AMMONIA in the last 168 hours. Coagulation Profile: No results for input(s): INR, PROTIME in the last 168 hours. Cardiac Enzymes: Recent Labs  Lab 10/19/18 0737  TROPONINI <0.03   BNP (last 3 results) No results for input(s): PROBNP in the last 8760 hours. HbA1C: No results for input(s): HGBA1C in the last 72 hours. CBG: Recent Labs  Lab 10/20/18 1133 10/20/18 1704 10/20/18 2112 10/21/18 0803 10/21/18 1130  GLUCAP 208* 119* 169* 104* 110*   Lipid Profile: No results for input(s): CHOL, HDL, LDLCALC, TRIG, CHOLHDL, LDLDIRECT in the last 72 hours. Thyroid Function Tests: No results for input(s): TSH, T4TOTAL, FREET4, T3FREE, THYROIDAB in the last 72 hours. Anemia Panel: No results for input(s): VITAMINB12, FOLATE, FERRITIN, TIBC, IRON, RETICCTPCT in the last 72 hours.    Radiology Studies: I have reviewed all of the imaging during this hospital visit personally     Scheduled Meds: . insulin aspart  0-9 Units Subcutaneous TID WC  . ipratropium-albuterol  3 mL  Nebulization TID  . saccharomyces boulardii  250 mg Oral BID   Continuous Infusions: . sodium chloride 10 mL/hr at 10/20/18 1323  . sodium chloride 75 mL/hr at 10/21/18 0223  . azithromycin 500 mg (10/21/18 0830)  . cefTRIAXone (ROCEPHIN)  IV Stopped (10/20/18 3903)     LOS: 1 day        Chin Wachter Gerome Apley, MD [facility] Pager [phone]

## 2018-10-21 NOTE — Progress Notes (Signed)
PT Cancellation Note  Patient Details Name: Taylor Leon MRN: [id number] DOB: [birth year]   Cancelled Treatment:    Reason Eval/Treat Not Completed: Pain limiting ability to participate. Pt reports pain 10/10 in her lungs.  Nursing notified.  Will check back tomorrow to attempt PT eval.   Galen Manila 10/21/2018, 2:47 PM

## 2018-10-21 NOTE — Progress Notes (Signed)
*  PRELIMINARY RESULTS* Vascular Ultrasound Bilateral lower extremity venous duplex has been completed.  Preliminary findings: No evidence of deep vein thrombosis in the visualized veins or baker's cysts bilaterally.  Difficult exam secondary to patient body habitus and penetration.   Everrett Coombe 10/21/2018, 11:33 AM

## 2018-10-22 ENCOUNTER — Inpatient Hospital Stay (HOSPITAL_COMMUNITY): Payer: Self-pay

## 2018-10-22 DIAGNOSIS — J90 Pleural effusion, not elsewhere classified: Secondary | ICD-10-CM

## 2018-10-22 LAB — GRAM STAIN

## 2018-10-22 LAB — BODY FLUID CELL COUNT WITH DIFFERENTIAL
Eos, Fluid: 1 %
Lymphs, Fluid: 10 %
MONOCYTE-MACROPHAGE-SEROUS FLUID: 2 % — AB (ref 50–90)
Neutrophil Count, Fluid: 87 % — ABNORMAL HIGH (ref 0–25)
Total Nucleated Cell Count, Fluid: 690 cu mm (ref 0–1000)

## 2018-10-22 LAB — BASIC METABOLIC PANEL
ANION GAP: 9 (ref 5–15)
BUN: 12 mg/dL (ref 8–23)
CO2: 24 mmol/L (ref 22–32)
Calcium: 8.5 mg/dL — ABNORMAL LOW (ref 8.9–10.3)
Chloride: 106 mmol/L (ref 98–111)
Creatinine, Ser: 0.84 mg/dL (ref 0.44–1.00)
GLUCOSE: 131 mg/dL — AB (ref 70–99)
POTASSIUM: 3.8 mmol/L (ref 3.5–5.1)
SODIUM: 139 mmol/L (ref 135–145)

## 2018-10-22 LAB — CBC WITH DIFFERENTIAL/PLATELET
ABS IMMATURE GRANULOCYTES: 0.39 10*3/uL — AB (ref 0.00–0.07)
BASOS ABS: 0 10*3/uL (ref 0.0–0.1)
BASOS PCT: 0 %
EOS ABS: 0.2 10*3/uL (ref 0.0–0.5)
Eosinophils Relative: 1 %
HCT: 35.6 % — ABNORMAL LOW (ref 36.0–46.0)
Hemoglobin: 10.4 g/dL — ABNORMAL LOW (ref 12.0–15.0)
IMMATURE GRANULOCYTES: 2 %
Lymphocytes Relative: 19 %
Lymphs Abs: 4.2 10*3/uL — ABNORMAL HIGH (ref 0.7–4.0)
MCH: 26.3 pg (ref 26.0–34.0)
MCHC: 29.2 g/dL — ABNORMAL LOW (ref 30.0–36.0)
MCV: 90.1 fL (ref 80.0–100.0)
Monocytes Absolute: 1.7 10*3/uL — ABNORMAL HIGH (ref 0.1–1.0)
Monocytes Relative: 8 %
NEUTROS ABS: 15 10*3/uL — AB (ref 1.7–7.7)
NEUTROS PCT: 70 %
NRBC: 0 % (ref 0.0–0.2)
PLATELETS: 456 10*3/uL — AB (ref 150–400)
RBC: 3.95 MIL/uL (ref 3.87–5.11)
RDW: 15.6 % — AB (ref 11.5–15.5)
WBC: 21.5 10*3/uL — ABNORMAL HIGH (ref 4.0–10.5)

## 2018-10-22 LAB — PROTEIN, PLEURAL OR PERITONEAL FLUID: TOTAL PROTEIN, FLUID: 4.6 g/dL

## 2018-10-22 LAB — GLUCOSE, CAPILLARY
GLUCOSE-CAPILLARY: 108 mg/dL — AB (ref 70–99)
GLUCOSE-CAPILLARY: 103 mg/dL — AB (ref 70–99)
Glucose-Capillary: 108 mg/dL — ABNORMAL HIGH (ref 70–99)
Glucose-Capillary: 115 mg/dL — ABNORMAL HIGH (ref 70–99)

## 2018-10-22 LAB — LACTATE DEHYDROGENASE, PLEURAL OR PERITONEAL FLUID: LD FL: 985 U/L — AB (ref 3–23)

## 2018-10-22 MED ORDER — LIDOCAINE HCL 1 % IJ SOLN
INTRAMUSCULAR | Status: AC
  Filled 2018-10-22: qty 20

## 2018-10-22 NOTE — Procedures (Signed)
Interventional Radiology Procedure Note  Procedure: Left thoracentesis  Complications: None  Estimated Blood Loss: None  Recommendations: - CXR - Return to room  Signed,  Criselda Peaches, MD

## 2018-10-22 NOTE — Progress Notes (Signed)
PT Cancellation Note  Patient Details Name: Taylor Leon MRN: [id number] DOB: 1956-04-04   Cancelled Treatment:    Reason Eval/Treat Not Completed: Patient at procedure or test/unavailable;Other (comment)(dyspnea at rest, difficulty speaking to PT ) Pt with dyspnea at rest and with talking upon PT arrival to room around 1200. Pt able to speak 1-2 words at a time before pausing to recover. Upon checking on pt at 1300, pt going for chest x-ray procedure. PT to check back with pt as schedule allows.   Julien Girt, PT [facility] Pager [phone]  Office [phone]  Roxine Caddy D Elonda Husky 10/22/2018, 1:03 PM

## 2018-10-22 NOTE — Progress Notes (Addendum)
PROGRESS NOTE    Taylor Leon  OIN:[id number] DOB: [birth year] DOA: 10/19/2018 PCP: Tammi Sou, MD    Brief Narrative:  62 year old female who presented with dyspnea,cough and chest pain. She does have the significant past medical history for asthma, type 2 diabetes mellitus,0andmorbid obesity.Reported acute onset of left-sided chest pain associated with dyspnea, and productive cough. At home had fevers, she was seen in the outpatient clinic, prescribed prednisone and a Z-Pak with no improvement of her symptoms.On her initial physical examination blood pressure 97/62, heart rate 75, respiratory rate 28, oxygen saturation 98%, lungs with positive wheezing and rhonchi predominantly on the left side, heart S1-S2 present and rhythmic, abdomen soft nontender, no lower extremity edema.Sodium 142, potassium 3.3, chloride 107, bicarb 25, glucose 133, BUN 15, creatinine 0.8, white count 16.3, hemoglobin 9.8, hematocrit 39.0, platelets 538.D-dimer 2.0.Chest x-ray with left lower lobe infiltrate, associated with small pleural effusion. CT chest confirming infiltrate in the left lower lobe, small parapneumonic pleural effusion,no pulmonary embolism.EKG with normal sinus rhythm normal axis, normal intervals.  Patient was admitted to the hospital with working diagnosis of sepsis due to left lower lobe pneumonia.   Assessment & Plan:   Active Problems:   Asthmatic bronchitis   DJD (degenerative joint disease) of knee   CAP (community acquired pneumonia)   Hypoxemia   Precordial chest pain   Obesity, Class III, BMI 40-49.9 (morbid obesity) (HCC)   Type 2 diabetes mellitus with hyperlipidemia ([place])  1. Pneumonia, left lower lobe community acquired, complicated with sepsis (present on admission) and left parapneumonic pleural effusion. Continue IV ceftriaxone, counting with outpatient therapy day 8 of azithromycin, will be dc today. Wbc down to 21 from 23, steroids have been  discontinued. Cultures have been no growth, urinary antigen for streptococcus pneumonia negative. Will continue pain control with ketorolac, acetaminophen and topical lidocaine. Due to persistent pleuritic chest pain, follow up chest film personally reviewed noted worsening left pleural effusion. Will order US thoracentesis, and will send fluid for analysis, cell count, culture, gram stain, pH, LDL and protein.     2. B0JG complicated with hyperglycemia. Fasting glucose this am 131, will capillary glucose 110, 89, 121, 108, 108. Continue insulin sliding scale for glucose monitoring and coverage.   3. Hypokalemia. K this am 3,8, with serum bicarbonate at 24. Renal function has remained at 0,84.  4. Morbid obesity. Calculated BMI 53.8. Will need outpatient follow up.    DVT prophylaxis:heparin Code Status:full Family Communication:no family at the bedside.  Disposition Plan/ discharge barriers:Pending clinical improvement   Consultants:    Procedures:    Antimicrobials:  Ceftriaxone   Azithromycin  Subjective: Patient has noted only mild improvement on her chest pain, continue to be severe in intensity, worse with movement and deep breathing, associated with dyspnea. No chest pain, no nausea or vomiting.   Objective: Vitals:   10/21/18 2132 10/21/18 2205 10/22/18 0411 10/22/18 0606  BP:  116/63  130/72  Pulse:  93  94  Resp:    18  Temp:    98.9 F (37.2 C)  TempSrc:    Oral  SpO2: 94%  92% 92%  Weight:      Height:        Intake/Output Summary (Last 24 hours) at 10/22/2018 1004 Last data filed at 10/22/2018 0741 Gross per 24 hour  Intake 1515.9 ml  Output 900 ml  Net 615.9 ml   Filed Weights   10/19/18 0751 10/19/18 1233  Weight: (!) 154.7 kg (!) 155.2 kg  Examination:   General: deconditioned and in pain  Neurology: Awake and alert, non focal  E ENT: mild pallor, no icterus, oral mucosa moist Cardiovascular: No JVD. S1-S2  present, rhythmic, no gallops, rubs, or murmurs. No lower extremity edema. Pulmonary: positive breath sounds bilaterally, decreased air movement, no wheezing, or rhonchi, left lower lobe rales. (anterior limited auscultation) Gastrointestinal. Abdomen protuberant with  no organomegaly, non tender, no rebound or guarding Skin. No rashes Musculoskeletal: no joint deformities     Data Reviewed: I have personally reviewed following labs and imaging studies  CBC: Recent Labs  Lab 10/19/18 0737 10/21/18 0455 10/22/18 0447  WBC 16.3* 23.2* 21.5*  NEUTROABS  --  17.5* 15.0*  HGB 11.8* 11.9* 10.4*  HCT 39.0 37.8 35.6*  MCV 87.4 84.4 90.1  PLT 538* 373 774*   Basic Metabolic Panel: Recent Labs  Lab 10/19/18 0737 10/21/18 0455 10/22/18 0447  NA 142 140 139  K 3.3* 4.4 3.8  CL 107 107 106  CO2 25 21* 24  GLUCOSE 133* 115* 131*  BUN 15 17 12   CREATININE 0.88 0.81 0.84  CALCIUM 9.1 8.7* 8.5*   GFR: Estimated Creatinine Clearance: 108.5 mL/min (by C-G formula based on SCr of 0.84 mg/dL). Liver Function Tests: No results for input(s): AST, ALT, ALKPHOS, BILITOT, PROT, ALBUMIN in the last 168 hours. No results for input(s): LIPASE, AMYLASE in the last 168 hours. No results for input(s): AMMONIA in the last 168 hours. Coagulation Profile: No results for input(s): INR, PROTIME in the last 168 hours. Cardiac Enzymes: Recent Labs  Lab 10/19/18 0737  TROPONINI <0.03   BNP (last 3 results) No results for input(s): PROBNP in the last 8760 hours. HbA1C: No results for input(s): HGBA1C in the last 72 hours. CBG: Recent Labs  Lab 10/21/18 0803 10/21/18 1130 10/21/18 1711 10/21/18 2100 10/22/18 0736  GLUCAP 104* 110* 89 121* 108*   Lipid Profile: No results for input(s): CHOL, HDL, LDLCALC, TRIG, CHOLHDL, LDLDIRECT in the last 72 hours. Thyroid Function Tests: No results for input(s): TSH, T4TOTAL, FREET4, T3FREE, THYROIDAB in the last 72 hours. Anemia Panel: No results  for input(s): VITAMINB12, FOLATE, FERRITIN, TIBC, IRON, RETICCTPCT in the last 72 hours.    Radiology Studies: I have reviewed all of the imaging during this hospital visit personally     Scheduled Meds: . acetaminophen  650 mg Oral Q6H  . insulin aspart  0-9 Units Subcutaneous TID WC  . ipratropium-albuterol  3 mL Nebulization TID  . lidocaine  1 patch Transdermal Q24H  . saccharomyces boulardii  250 mg Oral BID   Continuous Infusions: . sodium chloride 10 mL/hr at 10/20/18 1323  . sodium chloride 50 mL/hr at 10/21/18 2204  . azithromycin 500 mg (10/22/18 0905)  . cefTRIAXone (ROCEPHIN)  IV Stopped (10/21/18 1425)     LOS: 2 days        Mauricio Gerome Apley, MD [facility] Pager [phone]

## 2018-10-23 ENCOUNTER — Inpatient Hospital Stay (HOSPITAL_COMMUNITY): Payer: Self-pay

## 2018-10-23 ENCOUNTER — Encounter: Payer: Self-pay | Admitting: Family Medicine

## 2018-10-23 DIAGNOSIS — J45909 Unspecified asthma, uncomplicated: Secondary | ICD-10-CM

## 2018-10-23 LAB — BASIC METABOLIC PANEL
Anion gap: 9 (ref 5–15)
BUN: 8 mg/dL (ref 8–23)
CHLORIDE: 107 mmol/L (ref 98–111)
CO2: 24 mmol/L (ref 22–32)
CREATININE: 0.76 mg/dL (ref 0.44–1.00)
Calcium: 8.6 mg/dL — ABNORMAL LOW (ref 8.9–10.3)
GFR calc Af Amer: >60 mL/min (ref >60)
GFR calc non Af Amer: >60 mL/min (ref >60)
Glucose, Bld: 117 mg/dL — ABNORMAL HIGH (ref 70–99)
Potassium: 4 mmol/L (ref 3.5–5.1)
Sodium: 140 mmol/L (ref 135–145)

## 2018-10-23 LAB — CBC WITH DIFFERENTIAL/PLATELET
Abs Immature Granulocytes: 0.27 10*3/uL — ABNORMAL HIGH (ref 0.00–0.07)
BASOS ABS: 0 10*3/uL (ref 0.0–0.1)
Basophils Relative: 0 %
EOS PCT: 1 %
Eosinophils Absolute: 0.2 10*3/uL (ref 0.0–0.5)
HEMATOCRIT: 33.6 % — AB (ref 36.0–46.0)
HEMOGLOBIN: 10.1 g/dL — AB (ref 12.0–15.0)
Immature Granulocytes: 2 %
LYMPHS ABS: 1.6 10*3/uL (ref 0.7–4.0)
LYMPHS PCT: 11 %
MCH: 26.6 pg (ref 26.0–34.0)
MCHC: 30.1 g/dL (ref 30.0–36.0)
MCV: 88.7 fL (ref 80.0–100.0)
MONO ABS: 1.4 10*3/uL — AB (ref 0.1–1.0)
MONOS PCT: 9 %
NRBC: 0 % (ref 0.0–0.2)
Neutro Abs: 11.9 10*3/uL — ABNORMAL HIGH (ref 1.7–7.7)
Neutrophils Relative %: 77 %
Platelets: 410 10*3/uL — ABNORMAL HIGH (ref 150–400)
RBC: 3.79 MIL/uL — ABNORMAL LOW (ref 3.87–5.11)
RDW: 15.6 % — ABNORMAL HIGH (ref 11.5–15.5)
WBC: 15.4 10*3/uL — ABNORMAL HIGH (ref 4.0–10.5)

## 2018-10-23 LAB — GLUCOSE, CAPILLARY
GLUCOSE-CAPILLARY: 98 mg/dL (ref 70–99)
Glucose-Capillary: 112 mg/dL — ABNORMAL HIGH (ref 70–99)
Glucose-Capillary: 120 mg/dL — ABNORMAL HIGH (ref 70–99)

## 2018-10-23 LAB — PATHOLOGIST SMEAR REVIEW

## 2018-10-23 LAB — LACTATE DEHYDROGENASE: LDH: 137 U/L (ref 98–192)

## 2018-10-23 MED ORDER — SODIUM CHLORIDE 0.9 % IV SOLN
1.0000 g | Freq: Three times a day (TID) | INTRAVENOUS | Status: DC
  Administered 2018-10-24 (×2): 1 g via INTRAVENOUS
  Filled 2018-10-23 (×6): qty 1

## 2018-10-23 MED ORDER — VANCOMYCIN HCL 10 G IV SOLR
2500.0000 mg | Freq: Once | INTRAVENOUS | Status: AC
  Administered 2018-10-23: 2500 mg via INTRAVENOUS
  Filled 2018-10-23: qty 2500

## 2018-10-23 MED ORDER — HEPARIN SODIUM (PORCINE) 5000 UNIT/ML IJ SOLN
5000.0000 [IU] | Freq: Three times a day (TID) | INTRAMUSCULAR | Status: DC
  Administered 2018-10-23 (×2): 5000 [IU] via SUBCUTANEOUS
  Filled 2018-10-23 (×2): qty 1

## 2018-10-23 MED ORDER — SODIUM CHLORIDE 0.9 % IV SOLN
2.0000 g | Freq: Once | INTRAVENOUS | Status: AC
  Administered 2018-10-23: 2 g via INTRAVENOUS
  Filled 2018-10-23: qty 2

## 2018-10-23 MED ORDER — FLUCONAZOLE 150 MG PO TABS
150.0000 mg | ORAL_TABLET | Freq: Once | ORAL | Status: AC
  Administered 2018-10-23: 150 mg via ORAL
  Filled 2018-10-23: qty 1

## 2018-10-23 MED ORDER — VANCOMYCIN HCL IN DEXTROSE 1-5 GM/200ML-% IV SOLN
1000.0000 mg | Freq: Two times a day (BID) | INTRAVENOUS | Status: DC
  Administered 2018-10-24 – 2018-10-25 (×3): 1000 mg via INTRAVENOUS
  Filled 2018-10-23 (×4): qty 200

## 2018-10-23 NOTE — Progress Notes (Signed)
Pharmacy Antibiotic Note  Taylor Leon is a 62 y.o. female with PMH asthma, DM2, morbid obesity admitted on 10/19/2018 with sepsis d/t CAP. Patient initially started on Rocephin and Zithromax but today CT shows persistent loculated collections despite removal of 970 ml during thoracentesis. PCCM consulted and broadening abx to vancomycin/cefepime with Rx to dose.  Today, 10/23/2018:  Afebrile  WBC improving  SCr stable  Plan:  Vancomycin 2500 mg IV now, then 1000 mg IV q12 hr (est AUC 493 based on SCr 0.8)  Measure vancomycin AUC at steady state as indicated  Cefepime 1 g IV q8 hr - loaded with 2g, and could consider increasing to 2g q 8-12 hr for obesity if unable to break apart loculated fluid collections  Height: 5\' 7"  (170.2 cm) Weight: (!) 342 lb 3.2 oz (155.2 kg) IBW/kg (Calculated) : 61.6  Temp (24hrs), Avg:99 F (37.2 C), Min:98.1 F (36.7 C), Max:99.8 F (37.7 C)  Recent Labs  Lab 10/19/18 0737 10/21/18 0455 10/22/18 0447 10/23/18 0447  WBC 16.3* 23.2* 21.5* 15.4*  CREATININE 0.88 0.81 0.84 0.76    Estimated Creatinine Clearance: 114 mL/min (by C-G formula based on SCr of 0.76 mg/dL).    Allergies  Allergen Reactions  . Penicillins Nausea Only and Rash    Has patient had a PCN reaction causing immediate rash, facial/tongue/throat swelling, SOB or lightheadedness with hypotension: yES Has patient had a PCN reaction causing severe rash involving mucus membranes or skin necrosis: No Has patient had a PCN reaction that required hospitalization: No Has patient had a PCN reaction occurring within the last 10 years: No If all of the above answers are "NO", then may proceed with Cephalosporin use. CC    Antimicrobials this admission:  10/21 Azithro>>10/28 10/29 diflucan 150 mg x1 10/25 CTX>> 10/29 10/29 Vanc >> 10/29 Cefepime >>  Dose adjustments this admission: n/a  Microbiology results:  10/26 BCx: ngtd 10/28 pleural fluid: ngtd; nothing on  stain  Thank you for allowing pharmacy to be a part of this patient's care.  Hadlie Gipson A 10/23/2018 4:36 PM

## 2018-10-23 NOTE — Progress Notes (Addendum)
Patient with increased dyspnea overnight. Patient stated "I feel like the fluid is coming back." Oxygen saturations 88%-90% on 2 L [place] after ambulation to the bathroom. Oxygen increased to 2.5 L [place], oxygen saturations now 94-95%. Will continue to monitor the patient closely.

## 2018-10-23 NOTE — Progress Notes (Addendum)
PROGRESS NOTE    Taylor Leon  VQQ:[id number] DOB: May 24, 1956 DOA: 10/19/2018 PCP: Tammi Sou, MD    Brief Narrative:  62 year old female who presented with dyspnea,cough and chest pain. She does have the significant past medical history for asthma, type 2 diabetes mellitus,0andmorbid obesity.Reported acute onset of left-sided chest pain associated with dyspnea, and productive cough. At home had fevers, she was seen in the outpatient clinic, prescribed prednisone and a Z-Pak with no improvement of her symptoms.On her initial physical examination blood pressure 97/62, heart rate 75, respiratory rate 28, oxygen saturation 98%, lungs with positive wheezing and rhonchi predominantly on the left side, heart S1-S2 present and rhythmic, abdomen soft nontender, no lower extremity edema.Sodium 142, potassium 3.3, chloride 107, bicarb 25, glucose 133, BUN 15, creatinine 0.8, white count 16.3, hemoglobin 9.8, hematocrit 39.0, platelets 538.D-dimer 2.0.Chest x-ray with left lower lobe infiltrate, associated with small pleural effusion. CT chest confirming infiltrate in the left lower lobe, small parapneumonic pleural effusion,no pulmonary embolism.EKG with normal sinus rhythm normal axis, normal intervals.  Patient was admitted to the hospital with working diagnosis of sepsis due to left lower lobe pneumonia.   Assessment & Plan:   Active Problems:   Asthmatic bronchitis   DJD (degenerative joint disease) of knee   CAP (community acquired pneumonia)   Hypoxemia   Precordial chest pain   Obesity, Class III, BMI 40-49.9 (morbid obesity) (HCC)   Type 2 diabetes mellitus with hyperlipidemia ([place])   1. Pneumonia, left lower lobe community acquired, complicated with sepsis (present on admission) and left parapneumonic pleural effusion with acute hypoxic respiratory failure.Patient is sp thoracentesis 970 ml of fluids was removed, exudate per Light's criteria, follow up imaging with  chest CT with signs of loculation, fluid analysis wbc is 690 with 87 PMN, no organisms seen. Will consult Pulmonary/ CT surgery for recommendations, may need chest tube placement. Will change antibiotic therapy to zosyn and vancomycin for now. Continue oxymetry monitoring and supplemental 02 per [place], now on 2.5 LPM oxygen saturation 96%. Continue bronchodilator therapy.   2. E3PI complicated with hyperglycemia.Continue glucose monitoring with insulin sliding scale, capillary glucose 108, 115, 103, 98, 112. Fasting glucose this am 117.   3. Hypokalemia.Potassium has been corrected, will continue renal function monitoring.  4. Morbid obesity/ stage 3. Calculated BMI 53.8. Has increased hospital risk for complications.    DVT prophylaxis:heparin Code Status:full Family Communication:no family at the bedside.  Disposition Plan/ discharge barriers:Pending clinical improvement   Consultants:    Procedures:    Antimicrobials: IV vancomycin  IV ziosyn   Body mass index is 53.6 kg/m. Malnutrition Type:      Malnutrition Characteristics:      Nutrition Interventions:     RN Pressure Injury Documentation:     Consultants:   Pulmonary   Procedures:     Antimicrobials:       Subjective: Patient continue to have persistent dyspnea and chest pain, moderate to severe in intensity, worse with movement and cough, no improving factors,   Objective: Vitals:   10/23/18 0754 10/23/18 0800 10/23/18 1242 10/23/18 1434  BP:  128/67 121/78   Pulse:  79 78   Resp:  18 20   Temp:  98.9 F (37.2 C) 98.7 F (37.1 C)   TempSrc:  Oral Oral   SpO2: 94% 92% 95% 96%  Weight:      Height:        Intake/Output Summary (Last 24 hours) at 10/23/2018 1449 Last data filed at 10/23/2018 0800  Gross per 24 hour  Intake 1393.43 ml  Output 800 ml  Net 593.43 ml   Filed Weights   10/19/18 0751 10/19/18 1233  Weight: (!) 154.7 kg (!) 155.2 kg     Examination:   General: deconditioned, ill looking appearing  Neurology: Awake and alert, non focal  E ENT: positive pallor, no icterus, oral mucosa moist Cardiovascular: No JVD. S1-S2 present, rhythmic, no gallops, rubs, or murmurs. ++ non pitting lower extremity edema. Pulmonary: decreased breath sounds at the left side, poor air movement on the left, positive left rales. Gastrointestinal. Abdomen protuberant, with no organomegaly, non tender, no rebound or guarding Skin. No rashes Musculoskeletal: no joint deformities     Data Reviewed: I have personally reviewed following labs and imaging studies  CBC: Recent Labs  Lab 10/19/18 0737 10/21/18 0455 10/22/18 0447 10/23/18 0447  WBC 16.3* 23.2* 21.5* 15.4*  NEUTROABS  --  17.5* 15.0* 11.9*  HGB 11.8* 11.9* 10.4* 10.1*  HCT 39.0 37.8 35.6* 33.6*  MCV 87.4 84.4 90.1 88.7  PLT 538* 373 456* 259*   Basic Metabolic Panel: Recent Labs  Lab 10/19/18 0737 10/21/18 0455 10/22/18 0447 10/23/18 0447  NA 142 140 139 140  K 3.3* 4.4 3.8 4.0  CL 107 107 106 107  CO2 25 21* 24 24  GLUCOSE 133* 115* 131* 117*  BUN 15 17 12 8   CREATININE 0.88 0.81 0.84 0.76  CALCIUM 9.1 8.7* 8.5* 8.6*   GFR: Estimated Creatinine Clearance: 114 mL/min (by C-G formula based on SCr of 0.76 mg/dL). Liver Function Tests: No results for input(s): AST, ALT, ALKPHOS, BILITOT, PROT, ALBUMIN in the last 168 hours. No results for input(s): LIPASE, AMYLASE in the last 168 hours. No results for input(s): AMMONIA in the last 168 hours. Coagulation Profile: No results for input(s): INR, PROTIME in the last 168 hours. Cardiac Enzymes: Recent Labs  Lab 10/19/18 0737  TROPONINI <0.03   BNP (last 3 results) No results for input(s): PROBNP in the last 8760 hours. HbA1C: No results for input(s): HGBA1C in the last 72 hours. CBG: Recent Labs  Lab 10/22/18 1215 10/22/18 1726 10/22/18 2158 10/23/18 0734 10/23/18 1159  GLUCAP 108* 115* 103* 98  112*   Lipid Profile: No results for input(s): CHOL, HDL, LDLCALC, TRIG, CHOLHDL, LDLDIRECT in the last 72 hours. Thyroid Function Tests: No results for input(s): TSH, T4TOTAL, FREET4, T3FREE, THYROIDAB in the last 72 hours. Anemia Panel: No results for input(s): VITAMINB12, FOLATE, FERRITIN, TIBC, IRON, RETICCTPCT in the last 72 hours.    Radiology Studies: I have reviewed all of the imaging during this hospital visit personally     Scheduled Meds: . acetaminophen  650 mg Oral Q6H  . heparin injection (subcutaneous)  5,000 Units Subcutaneous Q8H  . insulin aspart  0-9 Units Subcutaneous TID WC  . ipratropium-albuterol  3 mL Nebulization TID  . lidocaine  1 patch Transdermal Q24H  . saccharomyces boulardii  250 mg Oral BID   Continuous Infusions: . sodium chloride 10 mL/hr at 10/20/18 1323  . cefTRIAXone (ROCEPHIN)  IV 2 g (10/23/18 0902)     LOS: 3 days        Mauricio Gerome Apley, MD [facility] Pager [phone]

## 2018-10-23 NOTE — Progress Notes (Signed)
PT Cancellation Note  Patient Details Name: Taylor Leon MRN: [id number] DOB: [birth year]   Cancelled Treatment:    Reason Eval/Treat Not Completed: Medical issues which prohibited therapy, patient reports making it to [facility] and is very SOB.    Tresa Endo [place] Pager [phone] Office [phone]  10/23/2018, 4:24 PM

## 2018-10-23 NOTE — Plan of Care (Signed)
  Problem: Clinical Measurements: Goal: Respiratory complications will improve Outcome: Not Progressing   Problem: Activity: Goal: Risk for activity intolerance will decrease Outcome: Not Progressing   

## 2018-10-23 NOTE — Progress Notes (Signed)
CT chest with possible loculated effusion/ suspected empyema, case discussed with pulmonary critical care Dr. Nelda Marseille, patient will be transferred to [facility] stepdown for CT surgery evaluation.

## 2018-10-24 ENCOUNTER — Encounter (HOSPITAL_COMMUNITY)
Admission: EM | Disposition: A | Discharge status: Home Health | Payer: Self-pay | Source: Home / Self Care | Attending: Internal Medicine

## 2018-10-24 ENCOUNTER — Inpatient Hospital Stay (HOSPITAL_COMMUNITY): Payer: Self-pay | Admitting: Anesthesiology

## 2018-10-24 ENCOUNTER — Inpatient Hospital Stay (HOSPITAL_COMMUNITY): Payer: Self-pay

## 2018-10-24 ENCOUNTER — Encounter (HOSPITAL_COMMUNITY): Payer: Self-pay | Admitting: Certified Registered"

## 2018-10-24 DIAGNOSIS — E785 Hyperlipidemia, unspecified: Secondary | ICD-10-CM

## 2018-10-24 DIAGNOSIS — E1169 Type 2 diabetes mellitus with other specified complication: Secondary | ICD-10-CM

## 2018-10-24 DIAGNOSIS — R0902 Hypoxemia: Secondary | ICD-10-CM

## 2018-10-24 DIAGNOSIS — J869 Pyothorax without fistula: Secondary | ICD-10-CM | POA: Diagnosis present

## 2018-10-24 HISTORY — PX: VIDEO BRONCHOSCOPY: SHX5072

## 2018-10-24 HISTORY — PX: EMPYEMA DRAINAGE: SHX5097

## 2018-10-24 HISTORY — PX: VIDEO ASSISTED THORACOSCOPY (VATS)/THOROCOTOMY: SHX6173

## 2018-10-24 LAB — TRIGLYCERIDES: Triglycerides: 148 mg/dL (ref <150)

## 2018-10-24 LAB — GLUCOSE, CAPILLARY
GLUCOSE-CAPILLARY: 100 mg/dL — AB (ref 70–99)
GLUCOSE-CAPILLARY: 102 mg/dL — AB (ref 70–99)
GLUCOSE-CAPILLARY: 169 mg/dL — AB (ref 70–99)
Glucose-Capillary: 103 mg/dL — ABNORMAL HIGH (ref 70–99)

## 2018-10-24 LAB — POCT I-STAT 3, ART BLOOD GAS (G3+)
BICARBONATE: 25.7 mmol/L (ref 20.0–28.0)
O2 Saturation: 98 %
PCO2 ART: 47.6 mmHg (ref 32.0–48.0)
PH ART: 7.34 — AB (ref 7.350–7.450)
TCO2: 27 mmol/L (ref 22–32)
pO2, Arterial: 105 mmHg (ref 83.0–108.0)

## 2018-10-24 LAB — TYPE AND SCREEN
ABO/RH(D): A POS
ANTIBODY SCREEN: NEGATIVE

## 2018-10-24 LAB — PH, BODY FLUID: PH, BODY FLUID: 7.1

## 2018-10-24 LAB — MRSA PCR SCREENING: MRSA BY PCR: NEGATIVE

## 2018-10-24 LAB — LEGIONELLA PNEUMOPHILA SEROGP 1 UR AG: L. pneumophila Serogp 1 Ur Ag: NEGATIVE

## 2018-10-24 LAB — ABO/RH: ABO/RH(D): A POS

## 2018-10-24 SURGERY — VIDEO ASSISTED THORACOSCOPY (VATS)/THOROCOTOMY
Anesthesia: General | Site: Chest

## 2018-10-24 MED ORDER — FENTANYL 40 MCG/ML IV SOLN
INTRAVENOUS | Status: DC
  Filled 2018-10-24: qty 25

## 2018-10-24 MED ORDER — ACETAMINOPHEN 160 MG/5ML PO SOLN
1000.0000 mg | Freq: Four times a day (QID) | ORAL | Status: AC
  Administered 2018-10-25 (×2): 1000 mg via ORAL
  Filled 2018-10-24 (×3): qty 40.6

## 2018-10-24 MED ORDER — SODIUM CHLORIDE 0.9 % IV SOLN
INTRAVENOUS | Status: DC | PRN
  Administered 2018-10-24 – 2018-10-26 (×2): 250 mL via INTRAVENOUS

## 2018-10-24 MED ORDER — ONDANSETRON HCL 4 MG/2ML IJ SOLN
INTRAMUSCULAR | Status: DC | PRN
  Administered 2018-10-24: 4 mg via INTRAVENOUS

## 2018-10-24 MED ORDER — SENNOSIDES-DOCUSATE SODIUM 8.6-50 MG PO TABS
1.0000 | ORAL_TABLET | Freq: Every day | ORAL | Status: DC
  Administered 2018-10-25 – 2018-10-30 (×3): 1 via ORAL
  Filled 2018-10-24 (×5): qty 1

## 2018-10-24 MED ORDER — ALBUTEROL SULFATE (2.5 MG/3ML) 0.083% IN NEBU: 2.5000 mg | INHALATION_SOLUTION | RESPIRATORY_TRACT | Status: DC | PRN

## 2018-10-24 MED ORDER — NALOXONE HCL 0.4 MG/ML IJ SOLN: 0.4000 mg | INTRAMUSCULAR | Status: DC | PRN

## 2018-10-24 MED ORDER — ORAL CARE MOUTH RINSE
15.0000 mL | OROMUCOSAL | Status: DC
  Administered 2018-10-24 – 2018-10-25 (×8): 15 mL via OROMUCOSAL

## 2018-10-24 MED ORDER — SODIUM CHLORIDE 0.9 % IV SOLN
2.0000 g | Freq: Three times a day (TID) | INTRAVENOUS | Status: DC
  Administered 2018-10-24 – 2018-10-29 (×15): 2 g via INTRAVENOUS
  Filled 2018-10-24 (×18): qty 2

## 2018-10-24 MED ORDER — MIDAZOLAM HCL 2 MG/2ML IJ SOLN
2.0000 mg | Freq: Once | INTRAMUSCULAR | Status: AC
  Administered 2018-10-24: 2 mg via INTRAVENOUS
  Filled 2018-10-24: qty 2

## 2018-10-24 MED ORDER — SODIUM CHLORIDE 0.9% FLUSH: 9.0000 mL | INTRAVENOUS | Status: DC | PRN

## 2018-10-24 MED ORDER — DEXAMETHASONE SODIUM PHOSPHATE 10 MG/ML IJ SOLN
INTRAMUSCULAR | Status: AC
  Filled 2018-10-24: qty 1

## 2018-10-24 MED ORDER — PROPOFOL 1000 MG/100ML IV EMUL
0.0000 ug/kg/min | INTRAVENOUS | Status: DC
  Administered 2018-10-24: 25 ug/kg/min via INTRAVENOUS
  Administered 2018-10-24: 40 ug/kg/min via INTRAVENOUS
  Administered 2018-10-24: 20 ug/kg/min via INTRAVENOUS
  Administered 2018-10-25: 40 ug/kg/min via INTRAVENOUS
  Administered 2018-10-25 (×2): 50 ug/kg/min via INTRAVENOUS
  Filled 2018-10-24 (×5): qty 100

## 2018-10-24 MED ORDER — DIPHENHYDRAMINE HCL 12.5 MG/5ML PO ELIX: 12.5000 mg | ORAL_SOLUTION | Freq: Four times a day (QID) | ORAL | Status: DC | PRN

## 2018-10-24 MED ORDER — ROCURONIUM BROMIDE 10 MG/ML (PF) SYRINGE
PREFILLED_SYRINGE | INTRAVENOUS | Status: DC | PRN
  Administered 2018-10-24: 50 mg via INTRAVENOUS
  Administered 2018-10-24 (×2): 20 mg via INTRAVENOUS

## 2018-10-24 MED ORDER — FENTANYL CITRATE (PF) 100 MCG/2ML IJ SOLN
INTRAMUSCULAR | Status: AC
  Filled 2018-10-24: qty 2

## 2018-10-24 MED ORDER — LIDOCAINE 2% (20 MG/ML) 5 ML SYRINGE
INTRAMUSCULAR | Status: AC
  Filled 2018-10-24: qty 5

## 2018-10-24 MED ORDER — FENTANYL CITRATE (PF) 100 MCG/2ML IJ SOLN: 50.0000 ug | INTRAMUSCULAR | Status: DC | PRN

## 2018-10-24 MED ORDER — DEXTROSE-NACL 5-0.45 % IV SOLN
INTRAVENOUS | Status: DC
  Administered 2018-10-24 (×2): via INTRAVENOUS

## 2018-10-24 MED ORDER — 0.9 % SODIUM CHLORIDE (POUR BTL) OPTIME
TOPICAL | Status: DC | PRN
  Administered 2018-10-24: 3000 mL

## 2018-10-24 MED ORDER — INSULIN ASPART 100 UNIT/ML ~~LOC~~ SOLN: 0.0000 [IU] | SUBCUTANEOUS | Status: DC

## 2018-10-24 MED ORDER — PROPOFOL 10 MG/ML IV BOLUS
INTRAVENOUS | Status: AC
  Filled 2018-10-24: qty 20

## 2018-10-24 MED ORDER — PROPOFOL 1000 MG/100ML IV EMUL
INTRAVENOUS | Status: AC
  Administered 2018-10-24: 40 ug/kg/min via INTRAVENOUS
  Filled 2018-10-24: qty 100

## 2018-10-24 MED ORDER — DOCUSATE SODIUM 50 MG/5ML PO LIQD: 100.0000 mg | Freq: Two times a day (BID) | ORAL | Status: DC | PRN

## 2018-10-24 MED ORDER — SUGAMMADEX SODIUM 500 MG/5ML IV SOLN
INTRAVENOUS | Status: DC | PRN
  Administered 2018-10-24: 300 mg via INTRAVENOUS

## 2018-10-24 MED ORDER — ENOXAPARIN SODIUM 40 MG/0.4ML ~~LOC~~ SOLN
40.0000 mg | Freq: Every day | SUBCUTANEOUS | Status: DC
  Administered 2018-10-25 – 2018-10-29 (×5): 40 mg via SUBCUTANEOUS
  Filled 2018-10-24 (×5): qty 0.4

## 2018-10-24 MED ORDER — SODIUM CHLORIDE 0.9% IV SOLUTION
Freq: Once | INTRAVENOUS | Status: AC
  Administered 2018-10-24: 07:00:00 via INTRAVENOUS

## 2018-10-24 MED ORDER — DIPHENHYDRAMINE HCL 50 MG/ML IJ SOLN: 12.5000 mg | Freq: Four times a day (QID) | INTRAMUSCULAR | Status: DC | PRN

## 2018-10-24 MED ORDER — FAMOTIDINE IN NACL 20-0.9 MG/50ML-% IV SOLN
20.0000 mg | Freq: Two times a day (BID) | INTRAVENOUS | Status: DC
  Administered 2018-10-24 – 2018-10-25 (×4): 20 mg via INTRAVENOUS
  Filled 2018-10-24 (×5): qty 50

## 2018-10-24 MED ORDER — ROCURONIUM BROMIDE 50 MG/5ML IV SOSY
PREFILLED_SYRINGE | INTRAVENOUS | Status: AC
  Filled 2018-10-24: qty 5

## 2018-10-24 MED ORDER — INSULIN ASPART 100 UNIT/ML ~~LOC~~ SOLN
0.0000 [IU] | SUBCUTANEOUS | Status: DC
  Administered 2018-10-24: 2 [IU] via SUBCUTANEOUS
  Administered 2018-10-24 – 2018-10-25 (×3): 4 [IU] via SUBCUTANEOUS

## 2018-10-24 MED ORDER — ACETAMINOPHEN 500 MG PO TABS
1000.0000 mg | ORAL_TABLET | Freq: Four times a day (QID) | ORAL | Status: AC
  Administered 2018-10-25 – 2018-10-29 (×17): 1000 mg via ORAL
  Filled 2018-10-24 (×16): qty 2

## 2018-10-24 MED ORDER — MIDAZOLAM HCL 2 MG/2ML IJ SOLN
INTRAMUSCULAR | Status: DC | PRN
  Administered 2018-10-24: 2 mg via INTRAVENOUS

## 2018-10-24 MED ORDER — FENTANYL CITRATE (PF) 100 MCG/2ML IJ SOLN
50.0000 ug | INTRAMUSCULAR | Status: DC | PRN
  Administered 2018-10-24 (×2): 50 ug via INTRAVENOUS
  Filled 2018-10-24 (×2): qty 2

## 2018-10-24 MED ORDER — PROPOFOL 10 MG/ML IV BOLUS
INTRAVENOUS | Status: DC | PRN
  Administered 2018-10-24: 10 mg via INTRAVENOUS

## 2018-10-24 MED ORDER — ONDANSETRON HCL 4 MG/2ML IJ SOLN
INTRAMUSCULAR | Status: AC
  Filled 2018-10-24: qty 2

## 2018-10-24 MED ORDER — SUFENTANIL CITRATE 50 MCG/ML IV SOLN
INTRAVENOUS | Status: AC
  Filled 2018-10-24: qty 1

## 2018-10-24 MED ORDER — FENTANYL CITRATE (PF) 100 MCG/2ML IJ SOLN: 25.0000 ug | INTRAMUSCULAR | Status: DC | PRN

## 2018-10-24 MED ORDER — MIDAZOLAM HCL 2 MG/2ML IJ SOLN
INTRAMUSCULAR | Status: AC
  Filled 2018-10-24: qty 2

## 2018-10-24 MED ORDER — LACTATED RINGERS IV SOLN
INTRAVENOUS | Status: DC
  Administered 2018-10-24 (×2): via INTRAVENOUS

## 2018-10-24 MED ORDER — BISACODYL 10 MG RE SUPP: 10.0000 mg | Freq: Every day | RECTAL | Status: DC | PRN

## 2018-10-24 MED ORDER — MIDAZOLAM HCL 2 MG/2ML IJ SOLN
INTRAMUSCULAR | Status: AC
  Administered 2018-10-24: 2 mg via INTRAVENOUS
  Filled 2018-10-24: qty 2

## 2018-10-24 MED ORDER — POTASSIUM CHLORIDE 10 MEQ/50ML IV SOLN
10.0000 meq | Freq: Every day | INTRAVENOUS | Status: DC | PRN
  Filled 2018-10-24: qty 1200

## 2018-10-24 MED ORDER — SUFENTANIL CITRATE 50 MCG/ML IV SOLN
INTRAVENOUS | Status: DC | PRN
  Administered 2018-10-24: 20 ug via INTRAVENOUS
  Administered 2018-10-24 (×3): 10 ug via INTRAVENOUS

## 2018-10-24 MED ORDER — IPRATROPIUM-ALBUTEROL 0.5-2.5 (3) MG/3ML IN SOLN
3.0000 mL | Freq: Four times a day (QID) | RESPIRATORY_TRACT | Status: DC
  Administered 2018-10-24 – 2018-10-25 (×6): 3 mL via RESPIRATORY_TRACT
  Filled 2018-10-24 (×6): qty 3

## 2018-10-24 MED ORDER — OXYCODONE HCL 5 MG PO TABS: 5.0000 mg | ORAL_TABLET | ORAL | Status: DC | PRN

## 2018-10-24 MED ORDER — DEXAMETHASONE SODIUM PHOSPHATE 10 MG/ML IJ SOLN
INTRAMUSCULAR | Status: DC | PRN
  Administered 2018-10-24: 10 mg via INTRAVENOUS

## 2018-10-24 MED ORDER — BISACODYL 5 MG PO TBEC
10.0000 mg | DELAYED_RELEASE_TABLET | Freq: Every day | ORAL | Status: DC
  Administered 2018-10-26 – 2018-10-31 (×4): 10 mg via ORAL
  Filled 2018-10-24 (×6): qty 2

## 2018-10-24 MED ORDER — TRAMADOL HCL 50 MG PO TABS: 50.0000 mg | ORAL_TABLET | Freq: Four times a day (QID) | ORAL | Status: DC | PRN

## 2018-10-24 MED ORDER — SUGAMMADEX SODIUM 500 MG/5ML IV SOLN
INTRAVENOUS | Status: AC
  Filled 2018-10-24: qty 5

## 2018-10-24 MED ORDER — FENTANYL CITRATE (PF) 100 MCG/2ML IJ SOLN
50.0000 ug | INTRAMUSCULAR | Status: DC | PRN
  Administered 2018-10-24 – 2018-10-25 (×3): 50 ug via INTRAVENOUS
  Filled 2018-10-24 (×3): qty 2

## 2018-10-24 MED ORDER — ONDANSETRON HCL 4 MG/2ML IJ SOLN: 4.0000 mg | Freq: Four times a day (QID) | INTRAMUSCULAR | Status: DC | PRN

## 2018-10-24 MED ORDER — CHLORHEXIDINE GLUCONATE 0.12% ORAL RINSE (MEDLINE KIT)
15.0000 mL | Freq: Two times a day (BID) | OROMUCOSAL | Status: DC
  Administered 2018-10-24 – 2018-10-26 (×3): 15 mL via OROMUCOSAL

## 2018-10-24 MED ORDER — SODIUM CHLORIDE 0.9 % IJ SOLN
INTRAMUSCULAR | Status: AC
  Filled 2018-10-24: qty 10

## 2018-10-24 SURGICAL SUPPLY — 61 items
APPLICATOR TIP EXT COSEAL (VASCULAR PRODUCTS) IMPLANT
CANISTER SUCT 3000ML PPV (MISCELLANEOUS) ×3 IMPLANT
CATH KIT ON Q 5IN SLV (PAIN MANAGEMENT) IMPLANT
CATH THORACIC 28FR (CATHETERS) IMPLANT
CATH THORACIC 36FR (CATHETERS) IMPLANT
CATH THORACIC 36FR RT ANG (CATHETERS) IMPLANT
CLIP VESOCCLUDE MED 6/CT (CLIP) ×3 IMPLANT
CONT SPEC 4OZ CLIKSEAL STRL BL (MISCELLANEOUS) ×6 IMPLANT
COVER SURGICAL LIGHT HANDLE (MISCELLANEOUS) ×3 IMPLANT
COVER WAND RF STERILE (DRAPES) ×3 IMPLANT
DERMABOND ADVANCED (GAUZE/BANDAGES/DRESSINGS)
DERMABOND ADVANCED .7 DNX12 (GAUZE/BANDAGES/DRESSINGS) IMPLANT
DRAIN CHANNEL 28F RND 3/8 FF (WOUND CARE) ×6 IMPLANT
DRAPE LAPAROSCOPIC ABDOMINAL (DRAPES) ×3 IMPLANT
DRAPE WARM FLUID 44X44 (DRAPE) ×6 IMPLANT
DRSG AQUACEL AG ADV 3.5X10 (GAUZE/BANDAGES/DRESSINGS) ×3 IMPLANT
ELECT BLADE 6.5 EXT (BLADE) ×6 IMPLANT
ELECT REM PT RETURN 9FT ADLT (ELECTROSURGICAL) ×3
ELECTRODE REM PT RTRN 9FT ADLT (ELECTROSURGICAL) ×2 IMPLANT
GAUZE SPONGE 4X4 12PLY STRL (GAUZE/BANDAGES/DRESSINGS) ×3 IMPLANT
GLOVE EUDERMIC 7 POWDERFREE (GLOVE) ×6 IMPLANT
GOWN STRL REUS W/ TWL LRG LVL3 (GOWN DISPOSABLE) ×4 IMPLANT
GOWN STRL REUS W/ TWL XL LVL3 (GOWN DISPOSABLE) ×2 IMPLANT
GOWN STRL REUS W/TWL LRG LVL3 (GOWN DISPOSABLE) ×2
GOWN STRL REUS W/TWL XL LVL3 (GOWN DISPOSABLE) ×1
HEMOSTAT SURGICEL 2X14 (HEMOSTASIS) IMPLANT
KIT BASIN OR (CUSTOM PROCEDURE TRAY) ×3 IMPLANT
KIT TURNOVER KIT B (KITS) ×3 IMPLANT
NS IRRIG 1000ML POUR BTL (IV SOLUTION) ×12 IMPLANT
PACK CHEST (CUSTOM PROCEDURE TRAY) ×3 IMPLANT
PAD ARMBOARD 7.5X6 YLW CONV (MISCELLANEOUS) ×6 IMPLANT
SEALANT SURG COSEAL 4ML (VASCULAR PRODUCTS) IMPLANT
SEALANT SURG COSEAL 8ML (VASCULAR PRODUCTS) IMPLANT
SOLUTION ANTI FOG 6CC (MISCELLANEOUS) ×3 IMPLANT
SUT PROLENE 3 0 SH DA (SUTURE) IMPLANT
SUT PROLENE 4 0 RB 1 (SUTURE)
SUT PROLENE 4-0 RB1 .5 CRCL 36 (SUTURE) IMPLANT
SUT SILK  1 MH (SUTURE) ×2
SUT SILK 1 MH (SUTURE) ×4 IMPLANT
SUT SILK 1 TIES 10X30 (SUTURE) IMPLANT
SUT SILK 2 0 SH (SUTURE) IMPLANT
SUT SILK 2 0SH CR/8 30 (SUTURE) IMPLANT
SUT VIC AB 1 CTX 18 (SUTURE) ×6 IMPLANT
SUT VIC AB 1 CTX 36 (SUTURE)
SUT VIC AB 1 CTX36XBRD ANBCTR (SUTURE) IMPLANT
SUT VIC AB 2-0 CTX 36 (SUTURE) ×3 IMPLANT
SUT VIC AB 2-0 UR6 27 (SUTURE) IMPLANT
SUT VIC AB 3-0 MH 27 (SUTURE) IMPLANT
SUT VIC AB 3-0 X1 27 (SUTURE) ×3 IMPLANT
SUT VICRYL 2 TP 1 (SUTURE) IMPLANT
SWAB COLLECTION DEVICE MRSA (MISCELLANEOUS) ×3 IMPLANT
SWAB CULTURE ESWAB REG 1ML (MISCELLANEOUS) ×3 IMPLANT
SYSTEM SAHARA CHEST DRAIN RE-I (WOUND CARE) ×3 IMPLANT
TAPE CLOTH 4X10 WHT NS (GAUZE/BANDAGES/DRESSINGS) ×3 IMPLANT
TAPE CLOTH SURG 4X10 WHT LF (GAUZE/BANDAGES/DRESSINGS) ×3 IMPLANT
TIP APPLICATOR SPRAY EXTEND 16 (VASCULAR PRODUCTS) IMPLANT
TOWEL GREEN STERILE (TOWEL DISPOSABLE) ×3 IMPLANT
TOWEL GREEN STERILE FF (TOWEL DISPOSABLE) ×3 IMPLANT
TRAP SPECIMEN MUCOUS 40CC (MISCELLANEOUS) ×9 IMPLANT
TRAY FOLEY MTR SLVR 14FR STAT (SET/KITS/TRAYS/PACK) ×3 IMPLANT
WATER STERILE IRR 1000ML POUR (IV SOLUTION) ×3 IMPLANT

## 2018-10-24 NOTE — Transfer of Care (Signed)
Immediate Anesthesia Transfer of Care Note  Patient: Taylor Leon  Procedure(s) Performed: VIDEO ASSISTED THORACOSCOPY (VATS)/THOROCOTOMY with EVACUATION OF EMPYEMA AND DECORTICATION. (Left Chest) EMPYEMA DRAINAGE (Left Chest) VIDEO BRONCHOSCOPY (N/A Bronchus)  Patient Location: ICU  Anesthesia Type:General  Level of Consciousness: awake and Patient remains intubated per anesthesia plan  Airway & Oxygen Therapy: Patient remains intubated per anesthesia plan and Patient placed on Ventilator (see vital sign flow sheet for setting)  Post-op Assessment: Report given to RN, Post -op Vital signs reviewed and stable and Patient moving all extremities  Post vital signs: Reviewed and stable  Last Vitals:  Vitals Value Taken Time  BP    Temp    Pulse 117 10/24/2018  2:57 PM  Resp 32 10/24/2018  2:57 PM  SpO2 94 % 10/24/2018  2:57 PM  Vitals shown include unvalidated device data.  Last Pain:  Vitals:   10/24/18 0727  TempSrc: Oral  PainSc:       Patients Stated Pain Goal: 4 (36/62/94 [id number])  Complications: No apparent anesthesia complications

## 2018-10-24 NOTE — Telephone Encounter (Signed)
FYI

## 2018-10-24 NOTE — Consult Note (Signed)
NAME:  Taylor Leon, MRN:  [id number], DOB:  1956-05-08, LOS: 4 ADMISSION DATE:  10/19/2018, CONSULTATION DATE:  10/30/219 REFERRING MD:  Dr. Servando Snare, CHIEF COMPLAINT:  Empyema  Brief History   62 yoF admitted to [facility] with left lung pneumonia and pleural effusion with progression of large left effusion.  Left thoracentesis performed on 10/28 with 945m yellowish-green fluid which was exudative by Lights criteria.  Follow-up CXR and chest CT showed progression of left effusion and new loculation with pneumonia in upper and lower lobes, consistent with empyema.  [facility]  Post-op, was not able to extubate secondary to low TVs and thus transferred to ICU on MV.     Past Medical History  Non-smoker, morbid obesity, DM, osteoarthritis, mild persistent adult asthma, pneumonia  Significant Hospital Events   [facility]/ s/p L VATS  Consults: date of consult/date signed off & final recs:  10/30 TCTS >>  Procedures (surgical and bedside):  10/28 L thoracentesis  10/30 L VATS 10/30 ETT >> 10/30  R radial Aline >>  Significant Diagnostic Tests:  10/25 CTA PE >> 1. Left lower lobe broncho pneumonia with small likely parapneumonic effusion. No CT evidence to suggest empyema. 2. No evidence of acute pulmonary embolus to the lobar level. 3. Enlarged main pulmonary artery and right atrial dilatation suggesting underlying pulmonary arterial hypertension with elevated right heart pressures  10/29 CT chest >> Left pleural effusion is significantly enlarged compared to prior exam and now appears to be loculated, with associated atelectasis or pneumonia involving the left upper and lower lobes. This is concerning for possible empyema.  Micro Data:  10/28 GS Left pleural >> negative 10/29 MRSA PCR >> negative 10/26 BC x2 >> ngtd 4 days 10/28 left pleural fluid cx >> 10/30 left bronchial washing GS >> 10/30 Left BAL cx  >> 10/30 left pleural GS >> 10/30 left BAL AFB >> 10/30 left BAL fungus >> 10/30 Left pleural AFB >> 10/30 left pleural fungal >> 10/30 Left lung AFB >> 10/30 left lung cx >> 10/30 left anaerobic cx >>  Antimicrobials:  10/25 azithro >> 10/29 10/25 ceftriaxone >> 10/29 10/29 cefepime >> 10/29 vancomycin >>  Subjective:    Objective   Blood pressure 139/60, pulse 87, temperature 99.2 F (37.3 C), temperature source Oral, resp. rate (!) 28, height 5' 7.01" (1.702 m), weight (!) 159.8 kg, SpO2 96 %.        Intake/Output Summary (Last 24 hours) at 10/24/2018 1444 Last data filed at 10/24/2018 1350 Gross per 24 hour  Intake 1540 ml  Output 2075 ml  Net -535 ml   Filed Weights   10/19/18 1233 10/23/18 2126 10/24/18 0939  Weight: (!) 155.2 kg (!) 159.8 kg (!) 159.8 kg    Examination: General:  Adult female sedate on MV in NAD HEENT: MM pink/moist, ETT 7.5 at 22, pupils 3/reactive, some scleral edema and eyelid swelling noted Neuro: sedated on propofol, does not f/c, MAE  CV: RR, SR 85, no murmur PULM: even/non-labored on MV,  Rhonchi bilaterally, diminished in LLL, CT to left mid-ax on 20 cm suction- no leak GI: obese, soft, hypo BS Extremities: warm/dry, no LE edema Skin: no rashes   Resolved Hospital Problem list    Assessment & Plan:  Acute respiratory insufficiency post-operatively  Asthma- adult onset - suspect some form of OSA / OHS in relation morbid obesity P:  Full MV support, PRVC 8 cc/kg  Wean FiO2/ PEEP for sats > 92 Daily SBT when meets criteria  ABG in 1 hr PAD protocol with propofol/ prn fentanyl and bowel regimen Duonebs q 6 and albuterol PRN q 2hr Place OGT, start TF in am if not exubated   Sepsis - Left PNA/ parapneumonic effusion with progression to Empyema - s/p Left VATS 10/30 - currently hemodynamically stable  P:  D5/ 0.45 NS at 100 ml/hr Per TCTS Left CT to 20 cm suction CXR in am  Follow left pleural cultures, BAL, fungal, AFB,  and blood cultures Continue vanc and cefepime for now Trend BMP/ CBC, fever curve  Normocytic Anemia P:  Trend CBC  DM P:  SSI sensitive CBG q 4hr    Disposition / Summary of Today's Plan 10/24/18   As above    Diet: NPO Pain/Anxiety/Delirium protocol (if indicated): Propofol/ prn fentanyl VAP protocol (if indicated): yes DVT prophylaxis: lovenox (start 10/31) GI prophylaxis: pepcid Hyperglycemia protocol: SSI Mobility: BR Code Status: Full  Family Communication: No family at bedside.   Labs   CBC: Recent Labs  Lab 10/19/18 0737 10/21/18 0455 10/22/18 0447 10/23/18 0447  WBC 16.3* 23.2* 21.5* 15.4*  NEUTROABS  --  17.5* 15.0* 11.9*  HGB 11.8* 11.9* 10.4* 10.1*  HCT 39.0 37.8 35.6* 33.6*  MCV 87.4 84.4 90.1 88.7  PLT 538* 373 456* 410*    Basic Metabolic Panel: Recent Labs  Lab 10/19/18 0737 10/21/18 0455 10/22/18 0447 10/23/18 0447  NA 142 140 139 140  K 3.3* 4.4 3.8 4.0  CL 107 107 106 107  CO2 25 21* 24 24  GLUCOSE 133* 115* 131* 117*  BUN _0 CREATININE 0.88 0.81 0.84 0.76  CALCIUM 9.1 8.7* 8.5* 8.6*   GFR: Estimated Creatinine Clearance: 116.1 mL/min (by C-G formula based on SCr of 0.76 mg/dL). Recent Labs  Lab 10/19/18 0737 10/21/18 0455 10/22/18 0447 10/23/18 0447  WBC 16.3* 23.2* 21.5* 15.4*    Liver Function Tests: No results for input(s): AST, ALT, ALKPHOS, BILITOT, PROT, ALBUMIN in the last 168 hours. No results for input(s): LIPASE, AMYLASE in the last 168 hours. No results for input(s): AMMONIA in the last 168 hours.  ABG No results found for: PHART, PCO2ART, PO2ART, HCO3, TCO2, ACIDBASEDEF, O2SAT   Coagulation Profile: No results for input(s): INR, PROTIME in the last 168 hours.  Cardiac Enzymes: Recent Labs  Lab 10/19/18 0737  TROPONINI <0.03    HbA1C: Hemoglobin A1C  Date/Time Value Ref Range Status  10/10/2018 08:24 AM 5.6 4.0 - 5.6 % Final  03/14/2018 10:13 AM 5.6  Final   Hgb A1c MFr Bld    Date/Time Value Ref Range Status  06/20/2017 10:30 AM 6.2 4.6 - 6.5 % Final    Comment:    Glycemic Control Guidelines for People with Diabetes:Non Diabetic:  <6%Goal of Therapy: <7%Additional Action Suggested:  >8%   06/22/2016 08:42 AM 6.0 4.6 - 6.5 % Final    Comment:    Glycemic Control Guidelines for People with Diabetes:Non Diabetic:  <6%Goal of Therapy: <7%Additional Action Suggested:  >8%     CBG: Recent Labs  Lab 10/23/18 1159 10/23/18 1701 10/23/18 2219 10/24/18 0728 10/24/18 0942  GLUCAP 112* 100* 120* 103* 102*    Admitting History of Present Illness.   63 year old female with PMH significant for morbid obesity, adult onset asthma, type 2 diabetes who presented to [facility] 10/25 with acute onset of left sided chest pain with cough, shortness of breath, and subjective  fevers.  Seen by PCP on 10/21 and treated with zpak and prednisone without improvement in symptoms.  CXR showed left lower pneumonia and small pleural effusion.  CT chest PE negative for PE, but showed small parapneumonic effusion.  She was admitted to [facility] with sepsis and started on Zithromax and ceftriaxone.  A left thoracentesis performed on 10/28 with 970 ml of yellow-green pleural fluid, WBC 690, 87 PMN, LDH 985, exudate effusion by Light's criteria.  Repeat CT chest showed progression of left effusion with loculation, with pneumonia in left upper and lower lobes, consistent with empyema.  Antibiotic coverage changed to vancomycin and cefepime on 10/29.  Blood cultures with no growth to date. She has remained hemodynamically stable with minimal 2-4 L [place] O2 requirement.  She was transferred to [facility] 10/30 for evaluation by TCTS.   Patient underwent a left VATS by Dr. Servando Snare 10/30.  She was unable to be extubated post operatively secondary to low tidal volumes and therefore returns to ICU on mechanical ventilation.  PCCM consulted for ventilator and medical management.   Review of Systems:   Unable to assess as  patient is intubated and sedated on MV.  Past Medical History  She,  has a past medical history of BPPV (benign paroxysmal positional vertigo) (01/30/2014), Cyst, breast (01/2012; 06/2012; 02/26/13; 03/03/14), Diabetes mellitus without complication ([place]), H/O allergic rhinitis, History of pneumonia (2009 and 2010), Mild persistent asthma, well controlled (2014), Morbid obesity ([place]), Nephrolithiasis (04/2018 first episode), Osteoarthritis of both knees, Prediabetes (2016), and Recurrent low back pain.   Surgical History    Past Surgical History:  Procedure Laterality Date  . APPENDECTOMY  1978  . CARDIAC CATHETERIZATION  2010   Normal coronaries per pt  . CARDIOVASCULAR STRESS TEST  2010   abnl per pt; f/u cath clean  . CESAREAN SECTION     x 3  . CHOLECYSTECTOMY  1986  . COLONOSCOPY  2008   normal per pt report (in [place])  . LUMBAR DISC SURGERY  1993   L5/S1  . TONSILLECTOMY AND ADENOIDECTOMY  1967ish  . TOTAL ABDOMINAL HYSTERECTOMY  1994   Ovaries are still in.  This was done for DUB/fibroids.     Social History   Social History   Socioeconomic History  . Marital status: Married    Spouse name: Not on file  . Number of children: 4  . Years of education: Not on file  . Highest education level: Not on file  Occupational History  . Not on file  Social Needs  . Financial resource strain: Not on file  . Food insecurity:    Worry: Not on file    Inability: Not on file  . Transportation needs:    Medical: Not on file    Non-medical: Not on file  Tobacco Use  . Smoking status: Former Smoker    Types: Cigarettes    Last attempt to quit: 12/28/2009    Years since quitting: 8.8  . Smokeless tobacco: Never Used  Substance and Sexual Activity  . Alcohol use: Yes    Comment: social  . Drug use: No  . Sexual activity: Not on file  Lifestyle  . Physical activity:    Days per week: Not on file    Minutes per session: Not on file  . Stress: Not on file  Relationships  . Social  connections:    Talks on phone: Not on file    Gets together: Not on file    Attends religious service:  Not on file    Active member of club or organization: Not on file    Attends meetings of clubs or organizations: Not on file    Relationship status: Not on file  . Intimate partner violence:    Fear of current or ex partner: Not on file    Emotionally abused: Not on file    Physically abused: Not on file    Forced sexual activity: Not on file  Other Topics Concern  . Not on file  Social History Narrative   Married, 4 grown children.   Relocated to [place], [place] from [place] about 2012.   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   Exercise: intermittently does water aerobics..           ,  reports that she quit smoking about 8 years ago. Her smoking use included cigarettes. She has never used smokeless tobacco. She reports that she drinks alcohol. She reports that she does not use drugs.   Family History   Her family history includes Arthritis in her mother and paternal grandmother; Diabetes in her mother; Heart disease (age of onset: 65) in her mother; Hypertension in her mother.   Allergies Allergies  Allergen Reactions  . Penicillins Nausea Only and Rash    Has patient had a PCN reaction causing immediate rash, facial/tongue/throat swelling, SOB or lightheadedness with hypotension: yES Has patient had a PCN reaction causing severe rash involving mucus membranes or skin necrosis: No Has patient had a PCN reaction that required hospitalization: No Has patient had a PCN reaction occurring within the last 10 years: No If all of the above answers are "NO", then may proceed with Cephalosporin use. CC     Home Medications  Prior to Admission medications   Medication Sig Start Date End Date Taking? Authorizing Provider  albuterol (PROVENTIL) (2.5 MG/3ML) 0.083% nebulizer solution Take 3 mLs (2.5 mg total) by  nebulization every 6 (six) hours as needed for wheezing. 10/15/18  Yes McGowen, Adrian Blackwater, MD  albuterol (VENTOLIN HFA) 108 (90 Base) MCG/ACT inhaler Inhale 2 puffs into the lungs every 4 (four) hours as needed for wheezing. 10/15/18 12/19/20 Yes McGowen, Adrian Blackwater, MD  ascorbic acid (VITAMIN C) 1000 MG tablet Take 1,000 mg by mouth daily.   Yes [provider]  azithromycin (ZITHROMAX) 250 MG tablet 2 tabs po qd x 1d, then 1 tab po qd x 4d 10/15/18  Yes McGowen, Adrian Blackwater, MD  BAYER MICROLET LANCETS lancets Use to check blood sugar once daily 12/13/17  Yes McGowen, Adrian Blackwater, MD  Cholecalciferol (VITAMIN D-3 PO) Take 1 tablet by mouth daily.   Yes [provider]  glucose blood (CONTOUR NEXT TEST) test strip Use to check blood sugar once daily 12/13/17  Yes McGowen, Adrian Blackwater, MD  meloxicam (MOBIC) 7.5 MG tablet 1-2 tabs po qd prn arthritis pain 11/02/17  Yes McGowen, Adrian Blackwater, MD  metFORMIN (GLUCOPHAGE) 1000 MG tablet 1 tab po bid with meals 10/15/18  Yes McGowen, Adrian Blackwater, MD  Misc Natural Products (TURMERIC CURCUMIN) CAPS Take 1 capsule by mouth 3 (three) times a week. Monday, Wednesday, and Friday.   Yes [provider]  Multiple Vitamin (MULTIVITAMIN) capsule Take 1 capsule by mouth daily.   Yes [provider]  naproxen sodium (ALEVE) 220 MG tablet Take 220 mg by mouth daily as needed (arthritis pain.).    Yes [provider]  predniSONE (DELTASONE)  20 MG tablet 3 tabs po qd x 2d, then 2 tabs po qd x 4d, then 1 tab po qd x 3d, then 1/2 tab po qd x 2d 10/15/18  Yes McGowen, Adrian Blackwater, MD  beclomethasone (QVAR REDIHALER) 80 MCG/ACT inhaler Inhale 2 puffs into the lungs 2 (two) times daily. Patient not taking: Reported on 10/19/2018 10/15/18   Tammi Sou, MD  beclomethasone (QVAR) 80 MCG/ACT inhaler Inhale 2 puffs into the lungs 2 (two) times daily as needed. Patient not taking: Reported on 10/19/2018 12/06/16   Tammi Sou, MD     Critical  care time:  85 mins    Kennieth Rad, [place] Pgr: [phone] or if no answer [phone] 10/24/2018, 4:19 PM

## 2018-10-24 NOTE — Progress Notes (Signed)
Patient ID: Taylor Leon, female   DOB: 06-12-1956, 62 y.o.   MRN: [id number]      [address]       [place] [address]             [phone]     Pre Procedure note for inpatients:   Taylor Leon has been scheduled for Procedure(s): VIDEO ASSISTED THORACOSCOPY (VATS)/THOROCOTOMY (Left) EMPYEMA DRAINAGE (Left) today. The various methods of treatment have been discussed with the patient. After consideration of the risks, benefits and treatment options the patient has consented to the planned procedure.   The patient has been seen and labs reviewed. There are no changes in the patient's condition to prevent proceeding with the planned procedure today.  Recent labs:  Lab Results  Component Value Date   WBC 15.4 (H) 10/23/2018   HGB 10.1 (L) 10/23/2018   HCT 33.6 (L) 10/23/2018   PLT 410 (H) 10/23/2018   GLUCOSE 117 (H) 10/23/2018   CHOL 199 06/20/2017   TRIG 116.0 06/20/2017   HDL 84.00 06/20/2017   LDLCALC 92 06/20/2017   ALT 16 05/06/2018   AST 21 05/06/2018   NA 140 10/23/2018   K 4.0 10/23/2018   CL 107 10/23/2018   CREATININE 0.76 10/23/2018   BUN 8 10/23/2018   CO2 24 10/23/2018   TSH 1.09 06/20/2017   HGBA1C 5.6 10/10/2018   Patient seen and xrays reviewed. Patient seen by Dr Cyndia Bent this am for left vats drainage of empyema. To Facilitate getting drainage done quickly he has asked me to proceed. I have discussed with patient and her husband they are agreeable    Grace Isaac, MD 10/24/2018 10:18 AM

## 2018-10-24 NOTE — Progress Notes (Signed)
Patient interviewed in preop. Patient able to confirm name, procedure, DOB, metal in toes, pain in left chest only with movement and npo status. Family at bedside.   Leatha Gilding, RN

## 2018-10-24 NOTE — Anesthesia Preprocedure Evaluation (Addendum)
Anesthesia Evaluation  Patient identified by MRN, date of birth, ID band Patient awake    Reviewed: Allergy & Precautions, NPO status , Patient's Chart, lab work & pertinent test results  History of Anesthesia Complications Negative for: history of anesthetic complications  Airway Mallampati: II  TM Distance: >3 FB Neck ROM: Full    Dental  (+) Dental Advisory Given, Teeth Intact   Pulmonary asthma , former smoker,     + decreased breath sounds+ wheezing      Cardiovascular negative cardio ROS   Rhythm:Regular Rate:Normal     Neuro/Psych  BPPV  negative psych ROS   GI/Hepatic negative GI ROS, Neg liver ROS,   Endo/Other  diabetes, Well Controlled, Oral Hypoglycemic AgentsMorbid obesity  Renal/GU Renal disease     Musculoskeletal  (+) Arthritis , Osteoarthritis,    Abdominal (+) + obese,   Peds  Hematology  (+) anemia ,   Anesthesia Other Findings   Reproductive/Obstetrics                            Anesthesia Physical Anesthesia Plan  ASA: III  Anesthesia Plan: General   Post-op Pain Management:    Induction: Intravenous  PONV Risk Score and Plan: 4 or greater and Treatment may vary due to age or medical condition, Ondansetron, Dexamethasone and Midazolam  Airway Management Planned: Double Lumen EBT  Additional Equipment: Arterial line, CVP and Ultrasound Guidance Line Placement  Intra-op Plan:   Post-operative Plan: Possible Post-op intubation/ventilation  Informed Consent: I have reviewed the patients History and Physical, chart, labs and discussed the procedure including the risks, benefits and alternatives for the proposed anesthesia with the patient or authorized representative who has indicated his/her understanding and acceptance.   Dental advisory given  Plan Discussed with: CRNA and Anesthesiologist  Anesthesia Plan Comments:       Anesthesia Quick  Evaluation

## 2018-10-24 NOTE — Progress Notes (Signed)
PROGRESS NOTE  Taylor Leon CBU:[id number] DOB: 07-Mar-1956 DOA: 10/19/2018 PCP: Tammi Sou, MD   LOS: 4 days   Brief Narrative / Interim history: 62 year old female who presented with dyspnea,cough and chest pain. She does have the significant past medical history for asthma, type 2 diabetes mellitus,0andmorbid obesity.Reported acute onset of left-sided chest pain associated with dyspnea, and productive cough. At home had fevers, she was seen in the outpatient clinic, prescribed prednisone and a Z-Pak with no improvement of her symptoms.On her initial physical examination blood pressure 97/62, heart rate 75, respiratory rate 28, oxygen saturation 98%, lungs with positive wheezing and rhonchi predominantly on the left side, heart S1-S2 present and rhythmic, abdomen soft nontender, no lower extremity edema.Patient was admitted to the hospital with working diagnosis of sepsis due to left lower lobe pneumonia.  On further imaging she was found to have loculated effusion and was transferred from [facility] long hospital to [facility] for thoracic surgery consult.  Subjective: -Feeling poorly this morning, complains of shortness of breath, fever and chills.  Chest discussed with thoracic surgery and she will undergo VATS later today  Assessment & Plan: Active Problems:   Asthmatic bronchitis   DJD (degenerative joint disease) of knee   CAP (community acquired pneumonia)   Hypoxemia   Precordial chest pain   Obesity, Class III, BMI 40-49.9 (morbid obesity) (HCC)   Type 2 diabetes mellitus with hyperlipidemia (HCC)   Left lower lobe pneumonia, community-acquired, acute hypoxic respiratory failure -Complicated by left parapneumonic pleural effusion which seems to be a complicated fluid collection, thoracic surgery consulted and will undergo VATS -For now keep on vancomycin and cefepime -Awaiting cultures  Type 2 diabetes mellitus -Continue sliding scale  Hypokalemia   -resolved  Morbid obesity    Scheduled Meds: . acetaminophen  650 mg Oral Q6H  . insulin aspart  0-9 Units Subcutaneous Q4H  . ipratropium-albuterol  3 mL Nebulization TID  . saccharomyces boulardii  250 mg Oral BID   Continuous Infusions: . sodium chloride 10 mL/hr at 10/20/18 1323  . ceFEPime (MAXIPIME) IV 1 g (10/24/18 0532)  . lactated ringers 10 mL/hr at 10/24/18 0945  . propofol (DIPRIVAN) infusion    . propofol    . vancomycin 1,000 mg (10/24/18 0703)   PRN Meds:.sodium chloride, bisacodyl, docusate, fentaNYL (SUBLIMAZE) injection, fentaNYL (SUBLIMAZE) injection, ipratropium-albuterol  DVT prophylaxis: SCDs Code Status: Full code Family Communication: No family at bedside Disposition Plan: Transfer to ICU  Consultants:   Thoracic surgery  Procedures:   VATS  Antimicrobials:  Vanc / Cefepime    Objective: Vitals:   10/24/18 1045 10/24/18 1050 10/24/18 1055 10/24/18 1100  BP:      Pulse: 88 90 87 87  Resp: (!) 29 (!) 27 (!) 28 (!) 28  Temp:      TempSrc:      SpO2: 100% 98% 97% 96%  Weight:      Height:        Intake/Output Summary (Last 24 hours) at 10/24/2018 1458 Last data filed at 10/24/2018 1350 Gross per 24 hour  Intake 1540 ml  Output 2075 ml  Net -535 ml   Filed Weights   10/19/18 1233 10/23/18 2126 10/24/18 0939  Weight: (!) 155.2 kg (!) 159.8 kg (!) 159.8 kg    Examination:  Constitutional: Appears tachypneic Eyes: PERRL, lids and conjunctivae normal ENMT: Mucous membranes are moist. Neck: normal, supple Respiratory: Decreased breath sounds at the bases, slight end expiratory wheezing, no crackles Cardiovascular: Regular rate and rhythm,  no murmurs / rubs / gallops.  Abdomen: no tenderness. Bowel sounds positive.  Musculoskeletal: no clubbing / cyanosis.  Skin: no rashes Neurologic: CN 2-12 grossly intact. Strength 5/5 in all 4.  Psychiatric: Normal judgment and insight. Alert and oriented x 3. Normal mood.    Data  Reviewed: I have independently reviewed following labs and imaging studies   CBC: Recent Labs  Lab 10/19/18 0737 10/21/18 0455 10/22/18 0447 10/23/18 0447  WBC 16.3* 23.2* 21.5* 15.4*  NEUTROABS  --  17.5* 15.0* 11.9*  HGB 11.8* 11.9* 10.4* 10.1*  HCT 39.0 37.8 35.6* 33.6*  MCV 87.4 84.4 90.1 88.7  PLT 538* 373 456* 992*   Basic Metabolic Panel: Recent Labs  Lab 10/19/18 0737 10/21/18 0455 10/22/18 0447 10/23/18 0447  NA 142 140 139 140  K 3.3* 4.4 3.8 4.0  CL 107 107 106 107  CO2 25 21* 24 24  GLUCOSE 133* 115* 131* 117*  BUN 15 17 12 8   CREATININE 0.88 0.81 0.84 0.76  CALCIUM 9.1 8.7* 8.5* 8.6*   GFR: Estimated Creatinine Clearance: 116.1 mL/min (by C-G formula based on SCr of 0.76 mg/dL). Liver Function Tests: No results for input(s): AST, ALT, ALKPHOS, BILITOT, PROT, ALBUMIN in the last 168 hours. No results for input(s): LIPASE, AMYLASE in the last 168 hours. No results for input(s): AMMONIA in the last 168 hours. Coagulation Profile: No results for input(s): INR, PROTIME in the last 168 hours. Cardiac Enzymes: Recent Labs  Lab 10/19/18 0737  TROPONINI <0.03   BNP (last 3 results) No results for input(s): PROBNP in the last 8760 hours. HbA1C: No results for input(s): HGBA1C in the last 72 hours. CBG: Recent Labs  Lab 10/23/18 1159 10/23/18 1701 10/23/18 2219 10/24/18 0728 10/24/18 0942  GLUCAP 112* 100* 120* 103* 102*   Lipid Profile: No results for input(s): CHOL, HDL, LDLCALC, TRIG, CHOLHDL, LDLDIRECT in the last 72 hours. Thyroid Function Tests: No results for input(s): TSH, T4TOTAL, FREET4, T3FREE, THYROIDAB in the last 72 hours. Anemia Panel: No results for input(s): VITAMINB12, FOLATE, FERRITIN, TIBC, IRON, RETICCTPCT in the last 72 hours. Urine analysis:    Component Value Date/Time   COLORURINE YELLOW 05/06/2018 1517   APPEARANCEUR CLEAR 05/06/2018 1517   LABSPEC >1.030 (H) 05/06/2018 1517   PHURINE 5.5 05/06/2018 1517   GLUCOSEU  NEGATIVE 05/06/2018 1517   HGBUR TRACE (A) 05/06/2018 1517   BILIRUBINUR NEGATIVE 05/06/2018 1517   BILIRUBINUR Negative 12/13/2017 [place] 05/06/2018 1517   PROTEINUR NEGATIVE 05/06/2018 1517   UROBILINOGEN 0.2 12/13/2017 0851   NITRITE NEGATIVE 05/06/2018 1517   LEUKOCYTESUR NEGATIVE 05/06/2018 1517   Sepsis Labs: Invalid input(s): PROCALCITONIN, LACTICIDVEN  Recent Results (from the past 240 hour(s))  Culture, blood (routine x 2)     Status: None (Preliminary result)   Collection Time: 10/20/18  2:30 PM  Result Value Ref Range Status   Specimen Description   Final    BLOOD RIGHT HAND Performed at [facility] [address], [place], [place] [address]    Special Requests   Final    AEB BCAV Performed at [facility] [address] [place], [place] [address]    Culture   Final    NO GROWTH 4 DAYS Performed at [facility], [place] [address], [place], [place] [address]    Report Status PENDING  Incomplete  Culture, blood (routine x 2)     Status: None (Preliminary result)   Collection Time: 10/20/18  2:30 PM  Result Value Ref Range Status   Specimen Description   Final    BLOOD RIGHT HAND Performed at [facility] [address], [place], [place] [address]    Special Requests   Final    AEB BCAV Performed at [facility] [address] [place], [place] [address]    Culture   Final    NO GROWTH 4 DAYS Performed at [facility], [place] [address] [place], [place] [address]    Report Status PENDING  Incomplete  Culture, body fluid-bottle     Status: None (Preliminary result)   Collection Time: 10/22/18  4:21 PM  Result Value Ref Range Status   Specimen Description FLUID PLEURAL LEFT  Final   Special Requests BOTTLES DRAWN AEROBIC AND ANAEROBIC  Final   Culture   Final    NO GROWTH 2 DAYS Performed at [facility], [place] [address] [place], [place]  [address]    Report Status PENDING  Incomplete  Gram stain     Status: None   Collection Time: 10/22/18  4:21 PM  Result Value Ref Range Status   Specimen Description FLUID PLEURAL LEFT  Final   Special Requests NONE  Final   Gram Stain   Final    WBC PRESENT,BOTH PMN AND MONONUCLEAR NO ORGANISMS SEEN CYTOSPIN SMEAR Performed at [facility], [address], [place], [place] [address]    Report Status 10/22/2018 FINAL  Final  MRSA PCR Screening     Status: None   Collection Time: 10/23/18  9:25 PM  Result Value Ref Range Status   MRSA by PCR NEGATIVE NEGATIVE Final    Comment:        The GeneXpert MRSA Assay (FDA approved for NASAL specimens only), is one component of a comprehensive MRSA colonization surveillance program. It is not intended to diagnose MRSA infection nor to guide or monitor treatment for MRSA infections. Performed at [facility], [place] [address] [place], [place] [address]       Radiology Studies: Dg Chest 1 View  Result Date: 10/23/2018 CLINICAL DATA:  Shortness of Breath EXAM: CHEST  1 VIEW COMPARISON:  October 22, 2018 FINDINGS: There is extensive airspace consolidation throughout virtually all of the left lung. The right lung is clear. There is cardiomegaly. The pulmonary vascularity on the right appears normal. Pulmonary vascular on the left is obscured. There is no adenopathy in areas that can be assessed for potential adenopathy. IMPRESSION: Diffuse opacity throughout the left lung, likely due to a combination of consolidation and effusion. There may be slightly more consolidation on the left compared to 1 day prior. Right lung is clear.  Cardiac enlargement is stable. Electronically Signed   By: Lowella Grip III M.D.   On: 10/23/2018 07:54   Dg Chest 1 View  Result Date: 10/22/2018 CLINICAL DATA:  Status post left thoracentesis EXAM: CHEST  1 VIEW COMPARISON:  10/22/2018 FINDINGS: Large left pleural effusion is again identified. There  continues to only be a small aerated segment of the left lung apex, similar or mildly increased in size compared to prior. No appreciable pneumothorax. Cardiomegaly is present. The right lung appears clear. IMPRESSION: 1. Large left pleural effusion is again observed. No pneumothorax. Small amount of aerated lung at the left lung apex. This aerated lung may be slightly more than on the 10/22/2018 exam at 1310 hours. 2. Cardiomegaly. Electronically Signed   By: Cindra Eves.D.  On: 10/22/2018 16:21   Ct Chest Wo Contrast  Result Date: 10/23/2018 CLINICAL DATA:  Dyspnea. EXAM: CT CHEST WITHOUT CONTRAST TECHNIQUE: Multidetector CT imaging of the chest was performed following the standard protocol without IV contrast. COMPARISON:  Radiograph of October 23, 2018. CT scan of October 19, 2018. FINDINGS: Cardiovascular: No evidence of thoracic aortic aneurysm. Mild cardiomegaly is noted. No pericardial effusion is noted. Mediastinum/Nodes: No enlarged mediastinal or axillary lymph nodes. Thyroid gland, trachea, and esophagus demonstrate no significant findings. Lungs/Pleura: No pneumothorax is noted. Mild right basilar subsegmental atelectasis is noted. Left pleural effusion is significantly larger and appears to be loculated, with atelectasis of the left lower lobe and pneumonia or atelectasis of the left upper lobe. Upper Abdomen: Stable probable hepatic cysts are noted. Musculoskeletal: No chest wall mass or suspicious bone lesions identified. IMPRESSION: Left pleural effusion is significantly enlarged compared to prior exam and now appears to be loculated, with associated atelectasis or pneumonia involving the left upper and lower lobes. This is concerning for possible empyema. Electronically Signed   By: Marijo Conception, M.D.   On: 10/23/2018 11:41   US Thoracentesis Asp Pleural Space W/img Guide  Result Date: 10/22/2018 INDICATION: 62 year old female with symptomatic left pleural effusion. EXAM:  ULTRASOUND GUIDED LEFT THORACENTESIS MEDICATIONS: None. COMPLICATIONS: None immediate. PROCEDURE: An ultrasound guided thoracentesis was thoroughly discussed with the patient and questions answered. The benefits, risks, alternatives and complications were also discussed. The patient understands and wishes to proceed with the procedure. Written consent was obtained. Ultrasound was performed to localize and mark an adequate pocket of fluid in the left chest. The area was then prepped and draped in the normal sterile fashion. 1% Lidocaine was used for local anesthesia. Under ultrasound guidance a 6 Fr Safe-T-Centesis catheter was introduced. Thoracentesis was performed. The catheter was removed and a dressing applied. FINDINGS: A total of approximately 970 mL of yellow pleural fluid was removed. Samples were sent to the laboratory as requested by the clinical team. IMPRESSION: Successful ultrasound guided left thoracentesis yielding 970 mL of pleural fluid. Electronically Signed   By: Jacqulynn Cadet M.D.   On: 10/22/2018 16:31     Marzetta Board, MD, PhD [facility] Pager [phone]  If 7PM-7AM, please contact night-coverage [email or web address] Password [email or web address] 10/24/2018, 2:58 PM

## 2018-10-24 NOTE — Anesthesia Procedure Notes (Signed)
Arterial Line Insertion Start/End10/30/2019 11:25 AM, 10/24/2018 11:29 AM Performed by: Audry Pili, MD, anesthesiologist  Patient location: Pre-op. Preanesthetic checklist: patient identified, IV checked, risks and benefits discussed, surgical consent, monitors and equipment checked, pre-op evaluation, timeout performed and anesthesia consent Lidocaine 1% used for infiltration and patient sedated Right, radial was placed Catheter size: 20 G Hand hygiene performed   Attempts: 2 (Previous attempts by CRNA on right radial unsuccessful) Procedure performed using ultrasound guided technique. Ultrasound Notes:anatomy identified, needle tip was noted to be adjacent to the nerve/plexus identified, no ultrasound evidence of intravascular and/or intraneural injection and image(s) printed for medical record Following insertion, dressing applied and Biopatch. Post procedure assessment: unchanged and normal  Patient tolerated the procedure well with no immediate complications.

## 2018-10-24 NOTE — OR Nursing (Signed)
Called Taylor Leon in [facility] ICU to let her know that patient would be coming directly to them instead of going to PACU first.

## 2018-10-24 NOTE — Anesthesia Procedure Notes (Signed)
Central Venous Catheter Insertion Performed by: Audry Pili, MD, anesthesiologist Start/End10/30/2019 10:37 AM, 10/24/2018 10:48 AM Patient location: Pre-op. Preanesthetic checklist: patient identified, IV checked, risks and benefits discussed, surgical consent, monitors and equipment checked, pre-op evaluation, timeout performed and anesthesia consent Position: Trendelenburg Lidocaine 1% used for infiltration and patient sedated Hand hygiene performed , maximum sterile barriers used  and Seldinger technique used Catheter size: 8.5 Fr Central line was placed.Double lumen Procedure performed using ultrasound guided technique. Ultrasound Notes:anatomy identified, needle tip was noted to be adjacent to the nerve/plexus identified, no ultrasound evidence of intravascular and/or intraneural injection and image(s) printed for medical record Attempts: 1 Following insertion, line sutured, dressing applied and Biopatch. Post procedure assessment: blood return through all ports, free fluid flow and no air  Patient tolerated the procedure well with no immediate complications.

## 2018-10-24 NOTE — Consult Note (Signed)
[address]       [place] [address]             [phone]      Cardiothoracic Surgery Consultation  Reason for Consult: Left empyema Referring Physician: Dr. Ellin Goodie  Taylor Leon is an 62 y.o. female.  HPI:   The patient is a 62 year old super morbidly obese woman with a BMI of 55, diabetes, prior pneumonia in 2009, mild adult onset asthma, kidney stones, and osteoarthritis of both knees who was in her usual state of health until Friday when she awoke with severe pain in her left side and shortness of breath.  She had been having some mild pains in the left side of her chest for several days prior to that but she thought she had pulled a muscle.  On Friday she presented to [facility] and was diagnosed with left lung pneumonia and pleural effusion and was transferred to [facility] long for further care.  CT scan of the chest at that time showed a left lower lobe pneumonia with a small parapneumonic effusion.  There is no evidence of pulmonary embolus.  A chest x-ray 3 days later showed progression to a large left pleural effusion.  She underwent left thoracentesis on 10/22/2018 removing 970 cc of yellowish-green pleural fluid.  The chemistry and cell counts were consistent with an exudate.  A follow-up chest x-ray yesterday showed diffuse opacity throughout the left lung and she had a follow-up CT scan yesterday which showed progressive enlargement of the left pleural effusion compared to the prior exam which now appears loculated associated with atelectasis or pneumonia involving the upper and lower lobes.  This was all felt to be consistent with an empyema.  I was called by Dr. Nelda Marseille and reviewed the CT scans felt the patient should be transferred to [facility] for surgical drainage of the empyema.  The patient is a non-smoker.  She has had no active problems with her teeth and is saw dentist within a year.  She has no history of alcohol abuse.  Past Medical  History:  Diagnosis Date  . BPPV (benign paroxysmal positional vertigo) 01/30/2014  . Cyst, breast 01/2012; 06/2012; [birth year]26/62; 07[birth year]29/[birth year]   Complicated cysts in upper outer left breast--no evidence of malignancy--f/u bilat diag mammo/left breast u/s on 03/06/15 showed resolution of left breast cysts.  Repeat screening mammogram 1 yr.  . Diabetes mellitus without complication (HCC)    Pre-diabetes  . H/O allergic rhinitis    Dr. Ishmael Holter  . History of pneumonia 2009 and 2010  . Mild persistent asthma, well controlled 2014   New adult onset asthma after respiratory infection (Dr. Ishmael Holter)  . Morbid obesity ([place])    BMI 50+.  At one point in time she was going to Bariatric clinic on [address] and got weekly HCG injections, monthly vit B12 injections, and took phentermine daily.  As of 05/2015 she was no longer doing this.  . Nephrolithiasis 04/2018 first episode   Dr. Lovena Neighbours ([facility]): 1.87m right UVJ stone + 6 mm L AML.  Pt elected for trial of passage.  . Osteoarthritis of both knees    No improvement with steroid injections; did synvisc x 3 yrs; bilat replacement was recommended but she declined.  . Prediabetes 2016   05/2015 A1c was 6.3%  . Recurrent low back pain     Past Surgical History:  Procedure Laterality Date  . APPENDECTOMY  1978  . CARDIAC  CATHETERIZATION  2010   Normal coronaries per pt  . CARDIOVASCULAR STRESS TEST  2010   abnl per pt; f/u cath clean  . CESAREAN SECTION     x 3  . CHOLECYSTECTOMY  1986  . COLONOSCOPY  2008   normal per pt report (in [place])  . LUMBAR DISC SURGERY  1993   L5/S1  . TONSILLECTOMY AND ADENOIDECTOMY  1967ish  . TOTAL ABDOMINAL HYSTERECTOMY  1994   Ovaries are still in.  This was done for DUB/fibroids.    Family History  Problem Relation Age of Onset  . Arthritis Mother   . Heart disease Mother 58  . Hypertension Mother   . Diabetes Mother   . Arthritis Paternal Grandmother     Social History:  reports that she quit smoking about 8 years  ago. Her smoking use included cigarettes. She has never used smokeless tobacco. She reports that she drinks alcohol. She reports that she does not use drugs.  Allergies:  Allergies  Allergen Reactions  . Penicillins Nausea Only and Rash    Has patient had a PCN reaction causing immediate rash, facial/tongue/throat swelling, SOB or lightheadedness with hypotension: yES Has patient had a PCN reaction causing severe rash involving mucus membranes or skin necrosis: No Has patient had a PCN reaction that required hospitalization: No Has patient had a PCN reaction occurring within the last 10 years: No If all of the above answers are "NO", then may proceed with Cephalosporin use. CC    Medications:  I have reviewed the patient's current medications. Prior to Admission:  Medications Prior to Admission  Medication Sig Dispense Refill Last Dose  . albuterol (PROVENTIL) (2.5 MG/3ML) 0.083% nebulizer solution Take 3 mLs (2.5 mg total) by nebulization every 6 (six) hours as needed for wheezing. 45 mL 1 10/18/2018 at Unknown time  . albuterol (VENTOLIN HFA) 108 (90 Base) MCG/ACT inhaler Inhale 2 puffs into the lungs every 4 (four) hours as needed for wheezing. 1 Inhaler 1 Unknown  . ascorbic acid (VITAMIN C) 1000 MG tablet Take 1,000 mg by mouth daily.   10/18/2018 at Unknown time  . azithromycin (ZITHROMAX) 250 MG tablet 2 tabs po qd x 1d, then 1 tab po qd x 4d 6 tablet 0 10/18/2018 at Unknown time  . BAYER MICROLET LANCETS lancets Use to check blood sugar once daily 100 each 11 Taking  . Cholecalciferol (VITAMIN D-3 PO) Take 1 tablet by mouth daily.   Past Week at Unknown time  . glucose blood (CONTOUR NEXT TEST) test strip Use to check blood sugar once daily 100 each 11 Taking  . meloxicam (MOBIC) 7.5 MG tablet 1-2 tabs po qd prn arthritis pain 60 tablet 6 Unknown  . metFORMIN (GLUCOPHAGE) 1000 MG tablet 1 tab po bid with meals 60 tablet 6 10/18/2018 at Unknown time  . Misc Natural Products  (TURMERIC CURCUMIN) CAPS Take 1 capsule by mouth 3 (three) times a week. Monday, Wednesday, and Friday.   10/17/2018  . Multiple Vitamin (MULTIVITAMIN) capsule Take 1 capsule by mouth daily.   10/18/2018 at Unknown time  . naproxen sodium (ALEVE) 220 MG tablet Take 220 mg by mouth daily as needed (arthritis pain.).    Past Week at Unknown time  . predniSONE (DELTASONE) 20 MG tablet 3 tabs po qd x 2d, then 2 tabs po qd x 4d, then 1 tab po qd x 3d, then 1/2 tab po qd x 2d 19 tablet 0 10/18/2018 at Unknown time  . beclomethasone (QVAR REDIHALER)  80 MCG/ACT inhaler Inhale 2 puffs into the lungs 2 (two) times daily. (Patient not taking: Reported on 10/19/2018) 1 Inhaler 11 Not Taking at Unknown time  . beclomethasone (QVAR) 80 MCG/ACT inhaler Inhale 2 puffs into the lungs 2 (two) times daily as needed. (Patient not taking: Reported on 10/19/2018) 1 Inhaler 3 Not Taking at Unknown time   Scheduled: . sodium chloride   Intravenous Once  . acetaminophen  650 mg Oral Q6H  . insulin aspart  0-9 Units Subcutaneous TID WC  . ipratropium-albuterol  3 mL Nebulization TID  . lidocaine  1 patch Transdermal Q24H  . saccharomyces boulardii  250 mg Oral BID   Continuous: . sodium chloride 10 mL/hr at 10/20/18 1323  . ceFEPime (MAXIPIME) IV 1 g (10/24/18 0532)  . vancomycin     ZRA:QTMAUQ chloride, guaiFENesin-dextromethorphan, ipratropium-albuterol, ketorolac  Results for orders placed or performed during the hospital encounter of 10/19/18 (from the past 48 hour(s))  Glucose, capillary     Status: Abnormal   Collection Time: 10/22/18  7:36 AM  Result Value Ref Range   Glucose-Capillary 108 (H) 70 - 99 mg/dL  Glucose, capillary     Status: Abnormal   Collection Time: 10/22/18 12:15 PM  Result Value Ref Range   Glucose-Capillary 108 (H) 70 - 99 mg/dL  Lactate dehydrogenase (pleural or peritoneal fluid)     Status: Abnormal   Collection Time: 10/22/18  4:21 PM  Result Value Ref Range   LD, Fluid 985 (H)  3 - 23 U/L    Comment: (NOTE) Results should be evaluated in conjunction with serum values    Fluid Type-FLDH PLEURAL     Comment: LEFT Performed at [facility] [address] [place], [place] [address] CORRECTED ON 10/28 AT 1706: PREVIOUSLY REPORTED AS Pleural, L   Body fluid cell count with differential     Status: Abnormal   Collection Time: 10/22/18  4:21 PM  Result Value Ref Range   Fluid Type-FCT PLEURAL     Comment: LEFT CORRECTED ON 10/28 AT 1706: PREVIOUSLY REPORTED AS Pleural, L    Color, Fluid YELLOW YELLOW   Appearance, Fluid HAZY (A) CLEAR   WBC, Fluid 690 0 - 1,000 cu mm   Neutrophil Count, Fluid 87 (H) 0 - 25 %   Lymphs, Fluid 10 %   Monocyte-Macrophage-Serous Fluid 2 (L) 50 - 90 %   Eos, Fluid 1 %   Other Cells, Fluid PENDING PATHOLOGIST REVIEW %    Comment: Performed at [facility], [place] [address] [place] [address], [place], [place] [address]  Protein, pleural or peritoneal fluid     Status: None   Collection Time: 10/22/18  4:21 PM  Result Value Ref Range   Total protein, fluid 4.6 g/dL    Comment: (NOTE) No normal range established for this test Results should be evaluated in conjunction with serum values    Fluid Type-FTP PLEURAL     Comment: LEFT Performed at [facility] [address] [place], [place] [address] CORRECTED ON 10/28 AT 1706: PREVIOUSLY REPORTED AS Pleural, L   Pathologist smear review     Status: None   Collection Time: 10/22/18  4:21 PM  Result Value Ref Range   Path Review Reviewed by Kalman Drape, M.D.     Comment: 10.29.19 ACUTE INFLAMMATION Performed at [facility] [address], [place], [place] [address]   Culture, body fluid-bottle     Status: None (Preliminary result)   Collection  Time: 10/22/18  4:21 PM  Result Value Ref Range   Specimen Description FLUID PLEURAL LEFT    Special Requests BOTTLES DRAWN AEROBIC AND ANAEROBIC    Culture      NO GROWTH <  24 HOURS Performed at [facility], [place] [address] [place], [place] [address]    Report Status PENDING   Gram stain     Status: None   Collection Time: 10/22/18  4:21 PM  Result Value Ref Range   Specimen Description FLUID PLEURAL LEFT    Special Requests NONE    Gram Stain      WBC PRESENT,BOTH PMN AND MONONUCLEAR NO ORGANISMS SEEN CYTOSPIN SMEAR Performed at [facility], [place] [address], [place], [place] [address]    Report Status 10/22/2018 FINAL   Glucose, capillary     Status: Abnormal   Collection Time: 10/22/18  5:26 PM  Result Value Ref Range   Glucose-Capillary 115 (H) 70 - 99 mg/dL  Glucose, capillary     Status: Abnormal   Collection Time: 10/22/18  9:58 PM  Result Value Ref Range   Glucose-Capillary 103 (H) 70 - 99 mg/dL  CBC with Differential/Platelet     Status: Abnormal   Collection Time: 10/23/18  4:47 AM  Result Value Ref Range   WBC 15.4 (H) 4.0 - 10.5 K/uL   RBC 3.79 (L) 3.87 - 5.11 MIL/uL   Hemoglobin 10.1 (L) 12.0 - 15.0 g/dL   HCT 33.6 (L) 36.0 - 46.0 %   MCV 88.7 80.0 - 100.0 fL   MCH 26.6 26.0 - 34.0 pg   MCHC 30.1 30.0 - 36.0 g/dL   RDW 15.6 (H) 11.5 - 15.5 %   Platelets 410 (H) 150 - 400 K/uL   nRBC 0.0 0.0 - 0.2 %   Neutrophils Relative % 77 %   Neutro Abs 11.9 (H) 1.7 - 7.7 K/uL   Lymphocytes Relative 11 %   Lymphs Abs 1.6 0.7 - 4.0 K/uL   Monocytes Relative 9 %   Monocytes Absolute 1.4 (H) 0.1 - 1.0 K/uL   Eosinophils Relative 1 %   Eosinophils Absolute 0.2 0.0 - 0.5 K/uL   Basophils Relative 0 %   Basophils Absolute 0.0 0.0 - 0.1 K/uL   Immature Granulocytes 2 %   Abs Immature Granulocytes 0.27 (H) 0.00 - 0.07 K/uL    Comment: Performed at [facility], [place] [address], [place], [place] [address]  Basic metabolic panel     Status: Abnormal   Collection Time: 10/23/18  4:47 AM  Result Value Ref Range   Sodium 140 135 - 145 mmol/L   Potassium 4.0 3.5 - 5.1 mmol/L   Chloride 107 98 - 111 mmol/L   CO2 24  22 - 32 mmol/L   Glucose, Bld 117 (H) 70 - 99 mg/dL   BUN 8 8 - 23 mg/dL   Creatinine, Ser 0.76 0.44 - 1.00 mg/dL   Calcium 8.6 (L) 8.9 - 10.3 mg/dL   GFR calc non Af Amer >60 >60 mL/min   GFR calc Af Amer >60 >60 mL/min    Comment: (NOTE) The eGFR has been calculated using the CKD EPI equation. This calculation has not been validated in all clinical situations. eGFR's persistently <60 mL/min signify possible Chronic Kidney Disease.    Anion gap 9 5 - 15    Comment: Performed at [facility], [place] [address] [place], [place] [address]  Lactate dehydrogenase     Status:  None   Collection Time: 10/23/18  4:47 AM  Result Value Ref Range   LDH 137 98 - 192 U/L    Comment: Performed at [facility], [place] [address], [place], [place] [address]  Glucose, capillary     Status: None   Collection Time: 10/23/18  7:34 AM  Result Value Ref Range   Glucose-Capillary 98 70 - 99 mg/dL  Glucose, capillary     Status: Abnormal   Collection Time: 10/23/18 11:59 AM  Result Value Ref Range   Glucose-Capillary 112 (H) 70 - 99 mg/dL  MRSA PCR Screening     Status: None   Collection Time: 10/23/18  9:25 PM  Result Value Ref Range   MRSA by PCR NEGATIVE NEGATIVE    Comment:        The GeneXpert MRSA Assay (FDA approved for NASAL specimens only), is one component of a comprehensive MRSA colonization surveillance program. It is not intended to diagnose MRSA infection nor to guide or monitor treatment for MRSA infections. Performed at [facility], [place] [address] [place], [place] [address]   Glucose, capillary     Status: Abnormal   Collection Time: 10/23/18 10:19 PM  Result Value Ref Range   Glucose-Capillary 120 (H) 70 - 99 mg/dL    Dg Chest 1 View  Result Date: 10/23/2018 CLINICAL DATA:  Shortness of Breath EXAM: CHEST  1 VIEW COMPARISON:  October 22, 2018 FINDINGS: There is extensive airspace consolidation throughout virtually all of the left  lung. The right lung is clear. There is cardiomegaly. The pulmonary vascularity on the right appears normal. Pulmonary vascular on the left is obscured. There is no adenopathy in areas that can be assessed for potential adenopathy. IMPRESSION: Diffuse opacity throughout the left lung, likely due to a combination of consolidation and effusion. There may be slightly more consolidation on the left compared to 1 day prior. Right lung is clear.  Cardiac enlargement is stable. Electronically Signed   By: Lowella Grip III M.D.   On: 10/23/2018 07:54   Dg Chest 1 View  Result Date: 10/22/2018 CLINICAL DATA:  Status post left thoracentesis EXAM: CHEST  1 VIEW COMPARISON:  10/22/2018 FINDINGS: Large left pleural effusion is again identified. There continues to only be a small aerated segment of the left lung apex, similar or mildly increased in size compared to prior. No appreciable pneumothorax. Cardiomegaly is present. The right lung appears clear. IMPRESSION: 1. Large left pleural effusion is again observed. No pneumothorax. Small amount of aerated lung at the left lung apex. This aerated lung may be slightly more than on the 10/22/2018 exam at 1310 hours. 2. Cardiomegaly. Electronically Signed   By: Van Clines M.D.   On: 10/22/2018 16:21   Dg Chest 2 View  Result Date: 10/22/2018 CLINICAL DATA:  Dyspnea. EXAM: CHEST - 2 VIEW COMPARISON:  Radiographs of August 19, 2018. FINDINGS: Interval development of large left pleural effusion is noted with underlying atelectasis or infiltrate. No significant mediastinal shift is noted. Stable cardiomediastinal silhouette. No pneumothorax is noted. Right lung is clear. Bony thorax is unremarkable. IMPRESSION: Interval development of large left pleural effusion with underlying atelectasis or infiltrate. No significant mediastinal shift is noted. Electronically Signed   By: Marijo Conception, M.D.   On: 10/22/2018 14:25   Ct Chest Wo Contrast  Result Date:  10/23/2018 CLINICAL DATA:  Dyspnea. EXAM: CT CHEST WITHOUT CONTRAST TECHNIQUE: Multidetector CT imaging of the chest was performed following the standard protocol  without IV contrast. COMPARISON:  Radiograph of October 23, 2018. CT scan of October 19, 2018. FINDINGS: Cardiovascular: No evidence of thoracic aortic aneurysm. Mild cardiomegaly is noted. No pericardial effusion is noted. Mediastinum/Nodes: No enlarged mediastinal or axillary lymph nodes. Thyroid gland, trachea, and esophagus demonstrate no significant findings. Lungs/Pleura: No pneumothorax is noted. Mild right basilar subsegmental atelectasis is noted. Left pleural effusion is significantly larger and appears to be loculated, with atelectasis of the left lower lobe and pneumonia or atelectasis of the left upper lobe. Upper Abdomen: Stable probable hepatic cysts are noted. Musculoskeletal: No chest wall mass or suspicious bone lesions identified. IMPRESSION: Left pleural effusion is significantly enlarged compared to prior exam and now appears to be loculated, with associated atelectasis or pneumonia involving the left upper and lower lobes. This is concerning for possible empyema. Electronically Signed   By: Marijo Conception, M.D.   On: 10/23/2018 11:41   US Thoracentesis Asp Pleural Space W/img Guide  Result Date: 10/22/2018 INDICATION: 62 year old female with symptomatic left pleural effusion. EXAM: ULTRASOUND GUIDED LEFT THORACENTESIS MEDICATIONS: None. COMPLICATIONS: None immediate. PROCEDURE: An ultrasound guided thoracentesis was thoroughly discussed with the patient and questions answered. The benefits, risks, alternatives and complications were also discussed. The patient understands and wishes to proceed with the procedure. Written consent was obtained. Ultrasound was performed to localize and mark an adequate pocket of fluid in the left chest. The area was then prepped and draped in the normal sterile fashion. 1% Lidocaine was used for  local anesthesia. Under ultrasound guidance a 6 Fr Safe-T-Centesis catheter was introduced. Thoracentesis was performed. The catheter was removed and a dressing applied. FINDINGS: A total of approximately 970 mL of yellow pleural fluid was removed. Samples were sent to the laboratory as requested by the clinical team. IMPRESSION: Successful ultrasound guided left thoracentesis yielding 970 mL of pleural fluid. Electronically Signed   By: Jacqulynn Cadet M.D.   On: 10/22/2018 16:31    Review of Systems  Constitutional: Positive for fever and malaise/fatigue.  HENT: Negative.   Eyes: Negative.   Respiratory: Positive for cough, shortness of breath and wheezing. Negative for hemoptysis and sputum production.        Left-sided chest pain since Friday  Cardiovascular: Negative for chest pain and orthopnea.  Gastrointestinal: Negative.   Genitourinary: Negative.   Musculoskeletal: Negative.   Skin: Negative.   Neurological: Negative.   Endo/Heme/Allergies: Negative.   Psychiatric/Behavioral: Negative.    Blood pressure 122/66, pulse 90, temperature 98.9 F (37.2 C), temperature source Oral, resp. rate 16, height '5\' 7"'$  (1.702 m), weight (!) 159.8 kg, SpO2 98 %. Physical Exam  Constitutional: She is oriented to person, place, and time.  Morbidly obese woman who looks uncomfortable.  She starts coughing whenever she talks.  HENT:  Head: Normocephalic and atraumatic.  Mouth/Throat: Oropharynx is clear and moist.  Teeth in good condition.  Eyes: Pupils are equal, round, and reactive to light. EOM are normal.  Neck: Normal range of motion. Neck supple. No JVD present.  Cardiovascular: Normal rate, regular rhythm and normal heart sounds.  No murmur heard. Respiratory:  Increased work of breathing.  Decreased breath sounds throughout the left lung  GI: Soft. Bowel sounds are normal. There is no tenderness.  Musculoskeletal: Normal range of motion. She exhibits no edema.  Lymphadenopathy:     She has no cervical adenopathy.  Neurological: She is alert and oriented to person, place, and time.  Skin: Skin is warm and dry.  Psychiatric: She has  a normal mood and affect.   CLINICAL DATA:  Dyspnea.  EXAM: CT CHEST WITHOUT CONTRAST  TECHNIQUE: Multidetector CT imaging of the chest was performed following the standard protocol without IV contrast.  COMPARISON:  Radiograph of October 23, 2018. CT scan of October 19, 2018.  FINDINGS: Cardiovascular: No evidence of thoracic aortic aneurysm. Mild cardiomegaly is noted. No pericardial effusion is noted.  Mediastinum/Nodes: No enlarged mediastinal or axillary lymph nodes. Thyroid gland, trachea, and esophagus demonstrate no significant findings.  Lungs/Pleura: No pneumothorax is noted. Mild right basilar subsegmental atelectasis is noted. Left pleural effusion is significantly larger and appears to be loculated, with atelectasis of the left lower lobe and pneumonia or atelectasis of the left upper lobe.  Upper Abdomen: Stable probable hepatic cysts are noted.  Musculoskeletal: No chest wall mass or suspicious bone lesions identified.  IMPRESSION: Left pleural effusion is significantly enlarged compared to prior exam and now appears to be loculated, with associated atelectasis or pneumonia involving the left upper and lower lobes. This is concerning for possible empyema.   Electronically Signed   By: Marijo Conception, M.D.   On: 10/23/2018 11:41   Assessment/Plan:  This 62 year old super morbidly obese woman presents with left lung pneumonia and a parapneumonic effusion which is progressed into empyema.  This has filled out most of the left chest.  She has low-grade fevers and leukocytosis.  This will require surgical drainage with thoracoscopy and likely thoracotomy to allow complete drainage and decortication of the left lung.  I discussed the operative procedure with her including alternatives, benefits,  and risks including but not limited to bleeding, blood transfusion, infection, respiratory failure, prolonged intubation, and recurrent effusion.  She understands all this and agrees to proceed.  We will schedule this for this afternoon when the operating room schedule allows.  I spent 40 minutes performing this consultation and > 50% of this time was spent face to face counseling and coordinating the care of this patient's left empyema.  Taylor Leon 10/24/2018, 6:00 AM

## 2018-10-24 NOTE — Procedures (Signed)
Echo attempted. Pt taken to OR. Will attempt again later.

## 2018-10-24 NOTE — Progress Notes (Signed)
Patient has been monitored with continuous pulse ox. At rest O2 saturation decreased 82-84% on 3 liters. Oxygen was increased to 4lt. Sat stabilized. Pt then requested to sit on the side of the bed. Sat decreased 82% 4lt, O2 was increased 6Lt. Pts sat stabilized at 97%. O2 weaned back to 4 lt and maintained at 95%. VS 126/76, 22. Will continue to monitor. Pt awaiting bed [facility] stepdown. Provider updated.

## 2018-10-24 NOTE — Anesthesia Procedure Notes (Signed)
Procedure Name: Intubation Date/Time: 10/24/2018 2:21 PM Performed by: Moshe Salisbury, CRNA Pre-anesthesia Checklist: Patient identified, Emergency Drugs available, Suction available and Patient being monitored Patient Re-evaluated:Patient Re-evaluated prior to induction Oxygen Delivery Method: Circle System Utilized Tube type: Subglottic suction tube Tube size: 7.5 mm Number of attempts: 1 Intubation method: ETT exchanged over flexible bougie. Placement Confirmation: positive ETCO2 and breath sounds checked- equal and bilateral Secured at: 21 cm Tube secured with: Tape Dental Injury: Teeth and Oropharynx as per pre-operative assessment

## 2018-10-24 NOTE — Progress Notes (Addendum)
Patient ID: Taylor Leon, female   DOB: 1956/12/13, 62 y.o.   MRN: [id number] EVENING ROUNDS NOTE :     [address]       [place] [address]             [phone]                 Day of Surgery Procedure(s) (LRB): VIDEO ASSISTED THORACOSCOPY (VATS)/THOROCOTOMY with EVACUATION OF EMPYEMA AND DECORTICATION. (Left) EMPYEMA DRAINAGE (Left) VIDEO BRONCHOSCOPY (N/A)  Total Length of Stay:  LOS: 4 days  BP (!) 115/55   Pulse 73   Temp 99.7 F (37.6 C) (Oral)   Resp (!) 21   Ht 5\' 7"  (1.702 m)   Wt (!) 159.8 kg   SpO2 100%   BMI 55.16 kg/m   .Intake/Output      10/30 0701 - 10/31 0700   P.O.    I.V. (mL/kg) 1468.1 (9.2)   IV Piggyback 50   Total Intake(mL/kg) 1518.1 (9.5)   Urine (mL/kg/hr) 750 (0.4)   Stool    Blood 200   Chest Tube 495   Total Output 1445   Net +73.1         . ceFEPime (MAXIPIME) IV    . dextrose 5 % and 0.45% NaCl 100 mL/hr at 10/24/18 1808  . famotidine (PEPCID) IV Stopped (10/24/18 1610)  . potassium chloride    . propofol (DIPRIVAN) infusion 20 mcg/kg/min (10/24/18 1808)  . vancomycin 1,000 mg (10/24/18 0703)     Lab Results  Component Value Date   WBC 15.4 (H) 10/23/2018   HGB 10.1 (L) 10/23/2018   HCT 33.6 (L) 10/23/2018   PLT 410 (H) 10/23/2018   GLUCOSE 117 (H) 10/23/2018   CHOL 199 06/20/2017   TRIG 116.0 06/20/2017   HDL 84.00 06/20/2017   LDLCALC 92 06/20/2017   ALT 16 05/06/2018   AST 21 05/06/2018   NA 140 10/23/2018   K 4.0 10/23/2018   CL 107 10/23/2018   CREATININE 0.76 10/23/2018   BUN 8 10/23/2018   CO2 24 10/23/2018   TSH 1.09 06/20/2017   HGBA1C 5.6 10/10/2018   Vs stable after left decortication drainage of empyema  remains on vent Ccm following   Grace Isaac MD  Beeper [phone] Office [phone] 10/24/2018 7:04 PM

## 2018-10-24 NOTE — Brief Op Note (Addendum)
      [address]       [place] [address]             [phone]     10/24/2018  7:03 PM  PATIENT:  Shelle Iron  62 y.o. female  PRE-OPERATIVE DIAGNOSIS:  LEFT EFFUSION  POST-OPERATIVE DIAGNOSIS:  Same   PROCEDURE:  Procedure(s): VIDEO ASSISTED THORACOSCOPY (VATS)/THOROCOTOMY with EVACUATION OF EMPYEMA AND DECORTICATION. (Left) EMPYEMA DRAINAGE (Left) VIDEO BRONCHOSCOPY (N/A)  SURGEON:  Surgeon(s) and Role:    * Grace Isaac, MD - Primary  PHYSICIAN ASSISTANT:  Nicholes Rough, PA-C   ANESTHESIA:   general  EBL:  200 mL   BLOOD ADMINISTERED:none  DRAINS: TWO BLAKE DRAINS   LOCAL MEDICATIONS USED:  NONE  SPECIMEN:  Source of Specimen:  PLEURAL PEEL  DISPOSITION OF SPECIMEN:  PATHOLOGY  COUNTS:  YES  DICTATION: .Dragon Dictation  PLAN OF CARE: Admit to inpatient   PATIENT DISPOSITION:  ICU - extubated and stable.   Delay start of Pharmacological VTE agent (>24hrs) due to surgical blood loss or risk of bleeding: yes

## 2018-10-24 NOTE — Anesthesia Procedure Notes (Signed)
Procedure Name: Intubation Date/Time: 11/11/2018 2:25 PM Performed by: Moshe Salisbury, CRNA Pre-anesthesia Checklist: Patient identified, Emergency Drugs available, Suction available and Patient being monitored Patient Re-evaluated:Patient Re-evaluated prior to induction Oxygen Delivery Method: Circle System Utilized Preoxygenation: Pre-oxygenation with 100% oxygen Induction Type: IV induction Ventilation: Mask ventilation without difficulty Laryngoscope Size: Mac and 3 Endobronchial tube: Left, Double lumen EBT, EBT position confirmed by auscultation and EBT position confirmed by fiberoptic bronchoscope and 37 Fr Number of attempts: 1 Airway Equipment and Method: Stylet and Oral airway Placement Confirmation: ETT inserted through vocal cords under direct vision,  positive ETCO2 and breath sounds checked- equal and bilateral Secured at: 27 cm Tube secured with: Tape Dental Injury: Teeth and Oropharynx as per pre-operative assessment

## 2018-10-24 NOTE — Progress Notes (Signed)
PT Cancellation Note  Patient Details Name: Taylor Leon MRN: [id number] DOB: Aug 18, 1956   Cancelled Treatment:     Pt in OR. Will cont to follow.  Reinaldo Berber, PT, DPT Acute Rehabilitation Services Pager: [phone] Office: [phone]     Reinaldo Berber 10/24/2018, 10:16 AM

## 2018-10-25 ENCOUNTER — Encounter (HOSPITAL_COMMUNITY): Payer: Self-pay | Admitting: Cardiothoracic Surgery

## 2018-10-25 ENCOUNTER — Inpatient Hospital Stay (HOSPITAL_COMMUNITY): Payer: Self-pay

## 2018-10-25 DIAGNOSIS — J181 Lobar pneumonia, unspecified organism: Secondary | ICD-10-CM

## 2018-10-25 LAB — BLOOD GAS, ARTERIAL
Acid-Base Excess: 2.2 mmol/L — ABNORMAL HIGH (ref 0.0–2.0)
Bicarbonate: 26.5 mmol/L (ref 20.0–28.0)
DRAWN BY: 511471
FIO2: 40
MECHVT: 490 mL
O2 Saturation: 95.9 %
PATIENT TEMPERATURE: 98.6
PCO2 ART: 43 mmHg (ref 32.0–48.0)
PEEP/CPAP: 5 cmH2O
PO2 ART: 82.2 mmHg — AB (ref 83.0–108.0)
RATE: 16 resp/min
pH, Arterial: 7.407 (ref 7.350–7.450)

## 2018-10-25 LAB — GLUCOSE, CAPILLARY
GLUCOSE-CAPILLARY: 110 mg/dL — AB (ref 70–99)
GLUCOSE-CAPILLARY: 115 mg/dL — AB (ref 70–99)
GLUCOSE-CAPILLARY: 124 mg/dL — AB (ref 70–99)
GLUCOSE-CAPILLARY: 139 mg/dL — AB (ref 70–99)
GLUCOSE-CAPILLARY: 176 mg/dL — AB (ref 70–99)
Glucose-Capillary: 181 mg/dL — ABNORMAL HIGH (ref 70–99)
Glucose-Capillary: 98 mg/dL (ref 70–99)

## 2018-10-25 LAB — CBC
HEMATOCRIT: 30.6 % — AB (ref 36.0–46.0)
Hemoglobin: 9 g/dL — ABNORMAL LOW (ref 12.0–15.0)
MCH: 25.8 pg — ABNORMAL LOW (ref 26.0–34.0)
MCHC: 29.4 g/dL — AB (ref 30.0–36.0)
MCV: 87.7 fL (ref 80.0–100.0)
Platelets: 447 10*3/uL — ABNORMAL HIGH (ref 150–400)
RBC: 3.49 MIL/uL — ABNORMAL LOW (ref 3.87–5.11)
RDW: 15.5 % (ref 11.5–15.5)
WBC: 18.1 10*3/uL — ABNORMAL HIGH (ref 4.0–10.5)
nRBC: 0 % (ref 0.0–0.2)

## 2018-10-25 LAB — MAGNESIUM: Magnesium: 2.4 mg/dL (ref 1.7–2.4)

## 2018-10-25 LAB — CULTURE, BLOOD (ROUTINE X 2)
CULTURE: NO GROWTH
Culture: NO GROWTH

## 2018-10-25 LAB — RENAL FUNCTION PANEL
ALBUMIN: 1.6 g/dL — AB (ref 3.5–5.0)
Anion gap: 6 (ref 5–15)
BUN: 8 mg/dL (ref 8–23)
CO2: 26 mmol/L (ref 22–32)
CREATININE: 0.76 mg/dL (ref 0.44–1.00)
Calcium: 8.3 mg/dL — ABNORMAL LOW (ref 8.9–10.3)
Chloride: 107 mmol/L (ref 98–111)
GFR calc non Af Amer: >60 mL/min (ref >60)
Glucose, Bld: 187 mg/dL — ABNORMAL HIGH (ref 70–99)
PHOSPHORUS: 3.4 mg/dL (ref 2.5–4.6)
POTASSIUM: 3.8 mmol/L (ref 3.5–5.1)
Sodium: 139 mmol/L (ref 135–145)

## 2018-10-25 LAB — ACID FAST SMEAR (AFB, MYCOBACTERIA)
Acid Fast Smear: NEGATIVE
Acid Fast Smear: NEGATIVE
Acid Fast Smear: NEGATIVE
Acid Fast Smear: NEGATIVE

## 2018-10-25 LAB — VANCOMYCIN, TROUGH: Vancomycin Tr: 11 ug/mL — ABNORMAL LOW (ref 15–20)

## 2018-10-25 MED ORDER — OXYCODONE-ACETAMINOPHEN 5-325 MG PO TABS
1.0000 | ORAL_TABLET | ORAL | Status: DC | PRN
  Filled 2018-10-25: qty 2

## 2018-10-25 MED ORDER — KETOROLAC TROMETHAMINE 10 MG PO TABS
10.0000 mg | ORAL_TABLET | Freq: Four times a day (QID) | ORAL | Status: AC | PRN
  Administered 2018-10-26 (×2): 10 mg via ORAL
  Filled 2018-10-25 (×4): qty 1

## 2018-10-25 MED ORDER — VANCOMYCIN HCL 10 G IV SOLR
1500.0000 mg | Freq: Two times a day (BID) | INTRAVENOUS | Status: DC
  Administered 2018-10-25 – 2018-10-26 (×2): 1500 mg via INTRAVENOUS
  Filled 2018-10-25 (×3): qty 1500

## 2018-10-25 MED ORDER — INSULIN ASPART 100 UNIT/ML ~~LOC~~ SOLN
0.0000 [IU] | Freq: Three times a day (TID) | SUBCUTANEOUS | Status: DC
  Administered 2018-10-28 (×2): 2 [IU] via SUBCUTANEOUS

## 2018-10-25 NOTE — Anesthesia Postprocedure Evaluation (Signed)
Anesthesia Post Note  Patient: Taylor Leon  Procedure(s) Performed: VIDEO ASSISTED THORACOSCOPY (VATS)/THOROCOTOMY with EVACUATION OF EMPYEMA AND DECORTICATION. (Left Chest) EMPYEMA DRAINAGE (Left Chest) VIDEO BRONCHOSCOPY (N/A [place])     Patient location during evaluation: ICU Anesthesia Type: General Level of consciousness: patient remains intubated per anesthesia plan and awake Pain management: pain level controlled Vital Signs Assessment: post-procedure vital signs reviewed and stable Respiratory status: patient remains intubated per anesthesia plan Cardiovascular status: stable Postop Assessment: no apparent nausea or vomiting Anesthetic complications: no Comments: Plans for vent weaning and extubation today, POD#1.    Last Vitals:  Vitals:   10/25/18 0744 10/25/18 0800  BP: (!) 93/57 104/73  Pulse: 66 65  Resp: (!) 25 (!) 21  Temp:    SpO2: 100% 100%    Last Pain:  Vitals:   10/25/18 0716  TempSrc:   PainSc: [place]

## 2018-10-25 NOTE — Progress Notes (Addendum)
Pharmacy Antibiotic Note  Taylor Leon is a 62 y.o. female with PMH asthma, DM2, morbid obesity admitted on 10/19/2018 with sepsis d/t CAP. Patient initially started on Rocephin and Zithromax but changed to vancomycin and cefepime on 10/29 following CT shows persistent loculated collections despite removal of 970 ml during thoracentesis.   Underwent VAT procedure with evacuation of empyema on 10/30. WBC today increased to 18.1 (did receive dose of dexamethasone on 10/30), remains afebrile. Scr stable at 0.76 (normCrCl>100 mL/min). No growth on cx, L pleural tissue cx showing rare GNR.   Plan:  Continue vancomycin 1000 mg IV q12 hr >> plan for VT on 10/31 prior to evening dose  Continue cefepime 2 g IV q8 hr (dose increased on 10/30)  Monitor renal fx, clinical pic, cx results, and VT as appropriate  Addendum (1810): Vancomycin trough 11 mcg/ml (subtherapeutic for goal 15-20 mcg/ml) on 1gm IV q12h  Plan:  Change vancomycin to 1500mg  IV q12h. Plan for trough at [facility] if pt continues.  Sherlon Handing, PharmD, BCPS Clinical pharmacist 10/25/2018 6:14 PM   Height: 5\' 7"  (170.2 cm) Weight: (!) 350 lb (158.8 kg) IBW/kg (Calculated) : 61.6  Temp (24hrs), Avg:99.3 F (37.4 C), Min:98.9 F (37.2 C), Max:99.7 F (37.6 C)  Recent Labs  Lab 10/19/18 0737 10/21/18 0455 10/22/18 0447 10/23/18 0447 10/25/18 0325  WBC 16.3* 23.2* 21.5* 15.4* 18.1*  CREATININE 0.88 0.81 0.84 0.76 0.76    Estimated Creatinine Clearance: 115.7 mL/min (by C-G formula based on SCr of 0.76 mg/dL).    Allergies  Allergen Reactions  . Penicillins Nausea Only and Rash    Has patient had a PCN reaction causing immediate rash, facial/tongue/throat swelling, SOB or lightheadedness with hypotension: yES Has patient had a PCN reaction causing severe rash involving mucus membranes or skin necrosis: No Has patient had a PCN reaction that required hospitalization: No Has patient had a PCN reaction occurring within the last  10 years: No If all of the above answers are "NO", then may proceed with Cephalosporin use. CC    Antimicrobials this admission:  10/21 Azithro>>10/28 10/29 diflucan 150 mg x1 10/25 CTX>> 10/29 10/29 Vanc >> 10/29 Cefepime >>  Dose adjustments this admission: N/A  Microbiology results:  10/26 BCx x2: ngtd 10/28 L pleural fluid: ngtd 10/30 Bronch: no orgs 10/30 L pleural tissue: GNR  Thank you for allowing pharmacy to be a part of this patient's care.  Doylene Canard, PharmD Clinical Pharmacist  Pager: [phone] Phone: [phone] 10/25/2018 8:35 AM

## 2018-10-25 NOTE — Progress Notes (Signed)
NAME:  Taylor Leon, MRN:  [id number], DOB:  Sep 19, 1956, LOS: 5 ADMISSION DATE:  10/19/2018, CONSULTATION DATE:  10/30/219 REFERRING MD:  Dr. Servando Snare, CHIEF COMPLAINT:  Empyema  Brief History   62 yoF admitted to [facility] with left lung pneumonia and pleural effusion with progression of large left effusion.  Left thoracentesis performed on 10/28 with 947m yellowish-green fluid which was exudative by Lights criteria.  Follow-up CXR and chest CT showed progression of left effusion and new loculation with pneumonia in upper and lower lobes, consistent with empyema.  [facility].  Post-op, was not able to extubate secondary to low TVs and thus transferred to ICU on MV.     Past Medical History  Non-smoker, morbid obesity, DM, osteoarthritis, mild persistent adult asthma, pneumonia  Significant Hospital Events   [facility]/ s/p L VATS  Consults: date of consult/date signed off & final recs:  10/30 TCTS >>  Procedures (surgical and bedside):  10/28 L thoracentesis  10/30 L VATS 10/30 ETT >> 10/25/2018 10/30  R radial Aline >> 10/25/2018 10/24/2018 chest tubes x2>>  Significant Diagnostic Tests:  10/25 CTA PE >> 1. Left lower lobe broncho pneumonia with small likely parapneumonic effusion. No CT evidence to suggest empyema. 2. No evidence of acute pulmonary embolus to the lobar level. 3. Enlarged main pulmonary artery and right atrial dilatation suggesting underlying pulmonary arterial hypertension with elevated right heart pressures  10/29 CT chest >> Left pleural effusion is significantly enlarged compared to prior exam and now appears to be loculated, with associated atelectasis or pneumonia involving the left upper and lower lobes. This is concerning for possible empyema.  Micro Data:  10/28 GS Left pleural >> negative 10/29 MRSA PCR >> negative 10/26 BC x2 >> ngtd 4 days 10/28 left pleural fluid cx  >> 10/30 left bronchial washing GS >> 10/30 Left BAL cx >> 10/30 left pleural GS >> 10/30 left BAL AFB >> 10/30 left BAL fungus >> 10/30 Left pleural AFB >> 10/30 left pleural fungal >> 10/30 Left lung AFB >> 10/30 left lung cx >> 10/30 left anaerobic cx >>  Antimicrobials:  10/25 azithro >> 10/29 10/25 ceftriaxone >> 10/29 10/29 cefepime >> 10/29 vancomycin >>  Subjective:    Objective   Blood pressure 111/78, pulse 73, temperature 98.9 F (37.2 C), temperature source Oral, resp. rate (!) 23, height _0  (1.702 m), weight (!) 158.8 kg, SpO2 100 %.    Vent Mode: PSV;CPAP FiO2 (%):  [40 %-80 %] 40 % Set Rate:  [12 bmp-16 bmp] 16 bmp Vt Set:  [490 mL] 490 mL PEEP:  [5 cmH20] 5 cmH20 Pressure Support:  [5 cmH20] 5 cmH20 Plateau Pressure:  [16 cmH20-22 cmH20] 20 cmH20   Intake/Output Summary (Last 24 hours) at 10/25/2018 1021 Last data filed at 10/25/2018 0800 Gross per 24 hour  Intake 3769.77 ml  Output 3305 ml  Net 464.77 ml   Filed Weights   10/23/18 2126 10/24/18 0939 10/25/18 0420  Weight: (!) 159.8 kg (!) 159.8 kg (!) 158.8 kg    Examination: General: Morbidly obese female who was successful liberated from mechanical ventilatory support without issue HEENT: No lymphadenopathy appreciated Short neck syndrome Neuro: Awake alert follows commands CV: s1s2 rrr, no m/r/g PULM: even/non-labored, lungs bilaterally managed on left left chest tubes x2 without air leak GOZ:YYQM non-tender, bsx4 active  Extremities: warm/dry, 1+ edema  Skin: no rashes or lesions   Resolved  Hospital Problem list    Assessment & Plan:  Acute respiratory insufficiency post-operatively  Asthma- adult onset - suspect some form of OSA / OHS in relation morbid obesity P:  Extubated 10/25/2018 Pulmonary toilet O2 as needed Monitor chest   Sepsis - Left PNA/ parapneumonic effusion with progression to Empyema - s/p Left VATS 10/30 - currently hemodynamically stable  P:  Follow  culture data Chest tubes per CVTS Antibiotics continue day 1 of Maxipime and vancomycin  Normocytic Anemia Recent Labs    10/23/18 0447 10/25/18 0325  HGB 10.1* 9.0*     P:  Trend CBC  DM CBG (last 3)  Recent Labs    10/24/18 2313 10/25/18 0334 10/25/18 0738  GLUCAP 139* 176* 181*    P:  Sliding scale insulin Will change to before meals and at bedtime   Disposition / Summary of Today's Plan 10/25/18   Extubated Evaluate for diet Pulmonary toilet Mobilize as tolerated    Diet: Advance diet Pain/Anxiety/Delirium protocol (if indicated): Stop propofol use PRN fentanyl for pain VAP protocol (if indicated): yes DVT prophylaxis: lovenox (start 10/31) GI prophylaxis: pepcid Hyperglycemia protocol: SSI Mobility: BR Code Status: Full  Family Communication: No family at bedside.   Labs   CBC: Recent Labs  Lab 10/19/18 0737 10/21/18 0455 10/22/18 0447 10/23/18 0447 10/25/18 0325  WBC 16.3* 23.2* 21.5* 15.4* 18.1*  NEUTROABS  --  17.5* 15.0* 11.9*  --   HGB 11.8* 11.9* 10.4* 10.1* 9.0*  HCT 39.0 37.8 35.6* 33.6* 30.6*  MCV 87.4 84.4 90.1 88.7 87.7  PLT 538* 373 456* 410* 447*    Basic Metabolic Panel: Recent Labs  Lab 10/19/18 0737 10/21/18 0455 10/22/18 0447 10/23/18 0447 10/25/18 0325  NA 142 140 139 140 139  K 3.3* 4.4 3.8 4.0 3.8  CL 107 107 106 107 107  CO2 25 21* _0 GLUCOSE 133* 115* 131* 117* 187*  BUN _1 CREATININE 0.88 0.81 0.84 0.76 0.76  CALCIUM 9.1 8.7* 8.5* 8.6* 8.3*  MG  --   --   --   --  2.4  PHOS  --   --   --   --  3.4   GFR: Estimated Creatinine Clearance: 115.7 mL/min (by C-G formula based on SCr of 0.76 mg/dL). Recent Labs  Lab 10/21/18 0455 10/22/18 0447 10/23/18 0447 10/25/18 0325  WBC 23.2* 21.5* 15.4* 18.1*    Liver Function Tests: Recent Labs  Lab 10/25/18 0325  ALBUMIN 1.6*   No results for input(s): LIPASE, AMYLASE in the last 168 hours. No results for input(s): AMMONIA in the last  168 hours.  ABG    Component Value Date/Time   PHART 7.407 10/25/2018 0340   PCO2ART 43.0 10/25/2018 0340   PO2ART 82.2 (L) 10/25/2018 0340   HCO3 26.5 10/25/2018 0340   TCO2 27 10/24/2018 1615   O2SAT 95.9 10/25/2018 0340     Coagulation Profile: No results for input(s): INR, PROTIME in the last 168 hours.  Cardiac Enzymes: Recent Labs  Lab 10/19/18 0737  TROPONINI <0.03    HbA1C: Hemoglobin A1C  Date/Time Value Ref Range Status  10/10/2018 08:24 AM 5.6 4.0 - 5.6 % Final  03/14/2018 10:13 AM 5.6  Final   Hgb A1c MFr Bld  Date/Time Value Ref Range Status  06/20/2017 10:30 AM 6.2 4.6 - 6.5 % Final    Comment:    Glycemic Control Guidelines for People with Diabetes:Non Diabetic:  <6%Goal of Therapy: <7%Additional Action  Suggested:  >8%   06/22/2016 08:42 AM 6.0 4.6 - 6.5 % Final    Comment:    Glycemic Control Guidelines for People with Diabetes:Non Diabetic:  <6%Goal of Therapy: <7%Additional Action Suggested:  >8%     CBG: Recent Labs  Lab 10/24/18 0942 10/24/18 1926 10/24/18 2313 10/25/18 0334 10/25/18 0738  GLUCAP 102* 169* 139* 176* 181*    Admitting History of Present Illness.   62 year old female with PMH significant for morbid obesity, adult onset asthma, type 2 diabetes who presented to [facility] 10/25 with acute onset of left sided chest pain with cough, shortness of breath, and subjective fevers.  Seen by PCP on 10/21 and treated with zpak and prednisone without improvement in symptoms.  CXR showed left lower pneumonia and small pleural effusion.  CT chest PE negative for PE, but showed small parapneumonic effusion.  She was admitted to [facility] with sepsis and started on Zithromax and ceftriaxone.  A left thoracentesis performed on 10/28 with 970 ml of yellow-green pleural fluid, WBC 690, 87 PMN, LDH 985, exudate effusion by Light's criteria.  Repeat CT chest showed progression of left effusion with loculation, with pneumonia in left upper and lower lobes,  consistent with empyema.  Antibiotic coverage changed to vancomycin and cefepime on 10/29.  Blood cultures with no growth to date. She has remained hemodynamically stable with minimal 2-4 L [place] O2 requirement.  She was transferred to [facility] 10/30 for evaluation by TCTS.   Patient underwent a left VATS by Dr. Servando Snare 10/30.  She was unable to be extubated post operatively secondary to low tidal volumes and therefore returns to ICU on mechanical ventilation.  PCCM consulted for ventilator and medical management.     Past Medical History  She,  has a past medical history of BPPV (benign paroxysmal positional vertigo) (01/30/2014), Cyst, breast (01/2012; 06/2012; 02/26/13; 03/03/14), Diabetes mellitus without complication ([place]), H/O allergic rhinitis, History of pneumonia (2009 and 2010), Mild persistent asthma, well controlled (2014), Morbid obesity ([place]), Nephrolithiasis (04/2018 first episode), Osteoarthritis of both knees, Prediabetes (2016), and Recurrent low back pain.   Surgical History    Past Surgical History:  Procedure Laterality Date  . APPENDECTOMY  1978  . CARDIAC CATHETERIZATION  2010   Normal coronaries per pt  . CARDIOVASCULAR STRESS TEST  2010   abnl per pt; f/u cath clean  . CESAREAN SECTION     x 3  . CHOLECYSTECTOMY  1986  . COLONOSCOPY  2008   normal per pt report (in [place])  . LUMBAR DISC SURGERY  1993   L5/S1  . TONSILLECTOMY AND ADENOIDECTOMY  1967ish  . TOTAL ABDOMINAL HYSTERECTOMY  1994   Ovaries are still in.  This was done for DUB/fibroids.     Social History   Social History   Socioeconomic History  . Marital status: Married    Spouse name: Not on file  . Number of children: 4  . Years of education: Not on file  . Highest education level: Not on file  Occupational History  . Not on file  Social Needs  . Financial resource strain: Not on file  . Food insecurity:    Worry: Not on file    Inability: Not on file  . Transportation needs:    Medical: Not on file      Non-medical: Not on file  Tobacco Use  . Smoking status: Former Smoker    Types: Cigarettes    Last attempt to quit: 12/28/2009  Years since quitting: 8.8  . Smokeless tobacco: Never Used  Substance and Sexual Activity  . Alcohol use: Yes    Comment: social  . Drug use: No  . Sexual activity: Not on file  Lifestyle  . Physical activity:    Days per week: Not on file    Minutes per session: Not on file  . Stress: Not on file  Relationships  . Social connections:    Talks on phone: Not on file    Gets together: Not on file    Attends religious service: Not on file    Active member of club or organization: Not on file    Attends meetings of clubs or organizations: Not on file    Relationship status: Not on file  . Intimate partner violence:    Fear of current or ex partner: Not on file    Emotionally abused: Not on file    Physically abused: Not on file    Forced sexual activity: Not on file  Other Topics Concern  . Not on file  Social History Narrative   Married, 4 grown children.   Relocated to [place], [place] from [place] about 2012.   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   Exercise: intermittently does water aerobics..           ,  reports that she quit smoking about 8 years ago. Her smoking use included cigarettes. She has never used smokeless tobacco. She reports that she drinks alcohol. She reports that she does not use drugs.   Family History   Her family history includes Arthritis in her mother and paternal grandmother; Diabetes in her mother; Heart disease (age of onset: 70) in her mother; Hypertension in her mother.   Allergies Allergies  Allergen Reactions  . Penicillins Nausea Only and Rash    Has patient had a PCN reaction causing immediate rash, facial/tongue/throat swelling, SOB or lightheadedness with hypotension: yES Has patient had a PCN reaction causing severe rash involving  mucus membranes or skin necrosis: No Has patient had a PCN reaction that required hospitalization: No Has patient had a PCN reaction occurring within the last 10 years: No If all of the above answers are "NO", then may proceed with Cephalosporin use. CC     Home Medications  Prior to Admission medications   Medication Sig Start Date End Date Taking? Authorizing Provider  albuterol (PROVENTIL) (2.5 MG/3ML) 0.083% nebulizer solution Take 3 mLs (2.5 mg total) by nebulization every 6 (six) hours as needed for wheezing. 10/15/18  Yes McGowen, Adrian Blackwater, MD  albuterol (VENTOLIN HFA) 108 (90 Base) MCG/ACT inhaler Inhale 2 puffs into the lungs every 4 (four) hours as needed for wheezing. 10/15/18 12/19/20 Yes McGowen, Adrian Blackwater, MD  ascorbic acid (VITAMIN C) 1000 MG tablet Take 1,000 mg by mouth daily.   Yes [provider]  azithromycin (ZITHROMAX) 250 MG tablet 2 tabs po qd x 1d, then 1 tab po qd x 4d 10/15/18  Yes McGowen, Adrian Blackwater, MD  BAYER MICROLET LANCETS lancets Use to check blood sugar once daily 12/13/17  Yes McGowen, Adrian Blackwater, MD  Cholecalciferol (VITAMIN D-3 PO) Take 1 tablet by mouth daily.   Yes [provider]  glucose blood (CONTOUR NEXT TEST) test strip Use to check blood sugar once daily 12/13/17  Yes McGowen, Adrian Blackwater, MD  meloxicam (MOBIC) 7.5 MG tablet 1-2 tabs po qd prn arthritis pain 11/02/17  Yes McGowen, Adrian Blackwater, MD  metFORMIN (GLUCOPHAGE) 1000 MG tablet 1 tab po bid with meals 10/15/18  Yes McGowen, Adrian Blackwater, MD  Misc Natural Products (TURMERIC CURCUMIN) CAPS Take 1 capsule by mouth 3 (three) times a week. Monday, Wednesday, and Friday.   Yes [provider]  Multiple Vitamin (MULTIVITAMIN) capsule Take 1 capsule by mouth daily.   Yes [provider]  naproxen sodium (ALEVE) 220 MG tablet Take 220 mg by mouth daily as needed (arthritis pain.).    Yes [provider]  predniSONE (DELTASONE) 20 MG tablet 3 tabs po qd x 2d, then 2 tabs  po qd x 4d, then 1 tab po qd x 3d, then 1/2 tab po qd x 2d 10/15/18  Yes McGowen, Adrian Blackwater, MD  beclomethasone (QVAR REDIHALER) 80 MCG/ACT inhaler Inhale 2 puffs into the lungs 2 (two) times daily. Patient not taking: Reported on 10/19/2018 10/15/18   Tammi Sou, MD  beclomethasone (QVAR) 80 MCG/ACT inhaler Inhale 2 puffs into the lungs 2 (two) times daily as needed. Patient not taking: Reported on 10/19/2018 12/06/16   Tammi Sou, MD     Critical care time:  50 mins    Richardson Landry Sadi Arave ACNP Maryanna Shape PCCM Pager [phone] till 1 pm If no answer page [phone] 10/25/2018, 10:21 AM

## 2018-10-25 NOTE — Procedures (Signed)
Extubation Procedure Note  Patient Details:   Name: Taylor Leon DOB: 16-Jun-1956 MRN: [id number]   Airway Documentation:    Vent end date: 10/25/18 Vent end time: 0859   Evaluation  O2 sats: stable throughout Complications: No apparent complications Patient did tolerate procedure well. Bilateral Breath Sounds: Clear, Diminished   Yes   Positive cuff leak noted. Pt placed on [place] 4 L with humidity, no stridor noted. Pt able to reach 825 mL using the incentive spirometer.  Mingo Amber Tatyana Biber 10/25/2018, 9:12 AM

## 2018-10-25 NOTE — Progress Notes (Addendum)
[facility] DAILY ICU PROGRESS NOTE                   [address]            [name] [address]          [phone]   1 Day Post-Op Procedure(s) (LRB): VIDEO ASSISTED THORACOSCOPY (VATS)/THOROCOTOMY with EVACUATION OF EMPYEMA AND DECORTICATION. (Left) EMPYEMA DRAINAGE (Left) VIDEO BRONCHOSCOPY (N/A)  Total Length of Stay:  LOS: 5 days   Subjective: She is awake and wants to be extubated  Objective: Vital signs in last 24 hours: Temp:  [98.9 F (37.2 C)-99.7 F (37.6 C)] 98.9 F (37.2 C) (10/31 0400) Pulse Rate:  [58-119] 66 (10/31 0744) Cardiac Rhythm: Normal sinus rhythm (10/31 0420) Resp:  [15-38] 25 (10/31 0744) BP: (93-137)/(50-97) 93/57 (10/31 0744) SpO2:  [94 %-100 %] 100 % (10/31 0744) Arterial Line BP: (102-150)/(49-67) 114/56 (10/31 0700) FiO2 (%):  [40 %-80 %] 40 % (10/31 0744) Weight:  [158.8 kg-159.8 kg] 158.8 kg (10/31 0420)  Filed Weights   10/23/18 2126 10/24/18 0939 10/25/18 0420  Weight: (!) 159.8 kg (!) 159.8 kg (!) 158.8 kg    Weight change: 0 kg      Intake/Output from previous day: 10/30 0701 - 10/31 0700 In: 3652.8 [I.V.:2899.2; IV Piggyback:753.7] Out: 3305 [Urine:2510; Blood:200; Chest Tube:595]  Intake/Output this shift: No intake/output data recorded.  Current Meds: Scheduled Meds: . acetaminophen  1,000 mg Oral Q6H   Or  . acetaminophen (TYLENOL) oral liquid 160 mg/5 mL  1,000 mg Oral Q6H  . bisacodyl  10 mg Oral Daily  . chlorhexidine gluconate (MEDLINE KIT)  15 mL Mouth Rinse BID  . enoxaparin (LOVENOX) injection  40 mg Subcutaneous Daily  . insulin aspart  0-24 Units Subcutaneous Q4H  . ipratropium-albuterol  3 mL Nebulization Q6H  . mouth rinse  15 mL Mouth Rinse 10 times per day  . senna-docusate  1 tablet Oral QHS   Continuous Infusions: . sodium chloride Stopped (10/25/18 0330)  . ceFEPime (MAXIPIME) IV Stopped (10/25/18 0530)  . dextrose 5 % and 0.45% NaCl 100 mL/hr at 10/25/18 0700  . famotidine  (PEPCID) IV Stopped (10/24/18 2209)  . potassium chloride    . propofol (DIPRIVAN) infusion 30 mcg/kg/min (10/25/18 0700)  . vancomycin Stopped (10/25/18 0641)   PRN Meds:.sodium chloride, albuterol, bisacodyl, docusate, fentaNYL (SUBLIMAZE) injection, fentaNYL (SUBLIMAZE) injection, potassium chloride  General appearance: alert, cooperative and no distress Heart: regular rate and rhythm, S1, S2 normal, no murmur, click, rub or gallop Lungs: clear to auscultation bilaterally Abdomen: soft, non-tender; bowel sounds normal; no masses,  no organomegaly Extremities: extremities normal, atraumatic, no cyanosis or edema Wound: clean and dry covered with a sterile dressing  Lab Results: CBC: Recent Labs    10/23/18 0447 10/25/18 0325  WBC 15.4* 18.1*  HGB 10.1* 9.0*  HCT 33.6* 30.6*  PLT 410* 447*   BMET:  Recent Labs    10/23/18 0447 10/25/18 0325  NA 140 139  K 4.0 3.8  CL 107 107  CO2 24 26  GLUCOSE 117* 187*  BUN 8 8  CREATININE 0.76 0.76  CALCIUM 8.6* 8.3*    CMET: Lab Results  Component Value Date   WBC 18.1 (H) 10/25/2018   HGB 9.0 (L) 10/25/2018   HCT 30.6 (L) 10/25/2018   PLT 447 (H) 10/25/2018   GLUCOSE 187 (H) 10/25/2018   CHOL 199 06/20/2017   TRIG 148 10/24/2018   HDL 84.00 06/20/2017  LDLCALC 92 06/20/2017   ALT 16 05/06/2018   AST 21 05/06/2018   NA 139 10/25/2018   K 3.8 10/25/2018   CL 107 10/25/2018   CREATININE 0.76 10/25/2018   BUN 8 10/25/2018   CO2 26 10/25/2018   TSH 1.09 06/20/2017   HGBA1C 5.6 10/10/2018      PT/INR: No results for input(s): LABPROT, INR in the last 72 hours. Radiology: Dg Chest Port 1 View  Result Date: 10/24/2018 CLINICAL DATA:  Endotracheal tube placement. EXAM: PORTABLE CHEST 1 VIEW COMPARISON:  Chest radiograph and CT of the chest performed 10/23/2018 FINDINGS: The patient's endotracheal tube is seen ending 5-6 cm above the carina. A right IJ line is noted ending about the proximal to mid SVC. A persistent  large loculated left-sided pleural effusion is again noted, with underlying airspace consolidation, concerning for pneumonia and empyema, as noted on the prior CT. The right lung appears relatively clear. No pneumothorax is seen The cardiomediastinal silhouette is mildly enlarged. No acute osseous abnormalities are identified. IMPRESSION: 1. Endotracheal tube seen ending 5-6 cm above the carina. 2. Persistent large loculated left-sided pleural effusion, with underlying airspace consolidation, concerning for pneumonia and empyema, as noted on the prior CT. 3. Mild cardiomegaly. Electronically Signed   By: Garald Balding M.D.   On: 10/24/2018 15:30   Dg Abd Portable 1v  Result Date: 10/25/2018 CLINICAL DATA:  62 year old female with advancement of enteric tube. EXAM: PORTABLE ABDOMEN - 1 VIEW COMPARISON:  Area radiograph dated 10/25/2018 FINDINGS: Interval advancement of enteric tube with side port and tip in the body of the stomach. Air is noted in the colon. Right upper quadrant cholecystectomy clips. Partially visualized left lung base opacity. IMPRESSION: Enteric tube with side port and tip in the body of the stomach. Electronically Signed   By: Anner Crete M.D.   On: 10/25/2018 02:11   Dg Abd Portable 1v  Result Date: 10/25/2018 CLINICAL DATA:  62 year old female status post NG tube placement. EXAM: PORTABLE ABDOMEN - 1 VIEW COMPARISON:  Chest radiograph dated 10/24/2018 FINDINGS: Partially visualized enteric tube with side port in the region of the gastroesophageal junction and tip in the proximal stomach. Recommend further advancing the tube into the stomach by an additional 14 cm. Air is noted within the colon. Left lung base opacity as seen on the earlier radiograph. IMPRESSION: Enteric tube with side port at the GE junction. Recommend further advancing of the tube into the stomach. Electronically Signed   By: Anner Crete M.D.   On: 10/25/2018 00:56     Assessment/Plan: S/P  Procedure(s) (LRB): VIDEO ASSISTED THORACOSCOPY (VATS)/THOROCOTOMY with EVACUATION OF EMPYEMA AND DECORTICATION. (Left) EMPYEMA DRAINAGE (Left) VIDEO BRONCHOSCOPY (N/A)   1. CV-NSR in the 60s. BP a little low this morning.  2. Pulm-intubated. Critical care is following and will likely extubate today. CXR shows stable appearance of left empyema and lower lobe consolidation. Keep chest tubes.  3. Renal-creatinine 0.76, electrolytes okay 4. H and H stable at 9.0/30.6, expected acute blood loss anemia 5. Endo-blood glucose well controlled. She is on metformin at home. Management per medicine. Continue SSI. 6. Lovenox for DVT prophylaxis  Plan: Extubate today per CCM. Keep chest tubes in place for now.     Elgie Collard 10/25/2018 7:55 AM   I have seen and examined Taylor Leon and agree with the above assessment  and plan.  Grace Isaac MD Beeper [phone] Office [phone] 10/25/2018 8:29 AM

## 2018-10-25 NOTE — Progress Notes (Signed)
      [address]       [place] [address]             [phone]      POD # 1 drainage of empyema  BP 125/68   Pulse 68   Temp 98.7 F (37.1 C) (Oral)   Resp 17   Ht 5\' 7"  (1.702 m)   Wt (!) 158.8 kg   SpO2 95%   BMI 54.82 kg/m   Intake/Output Summary (Last 24 hours) at 10/25/2018 1837 Last data filed at 10/25/2018 1800 Gross per 24 hour  Intake 2401.67 ml  Output 3505 ml  Net -1103.33 ml   CBG elevated this AM, better since then  [place] C. Roxan Hockey, MD [facility] [phone]

## 2018-10-26 ENCOUNTER — Inpatient Hospital Stay (HOSPITAL_COMMUNITY): Payer: Self-pay

## 2018-10-26 DIAGNOSIS — G4733 Obstructive sleep apnea (adult) (pediatric): Secondary | ICD-10-CM

## 2018-10-26 DIAGNOSIS — J189 Pneumonia, unspecified organism: Secondary | ICD-10-CM

## 2018-10-26 HISTORY — DX: Pneumonia, unspecified organism: J18.9

## 2018-10-26 HISTORY — DX: Obstructive sleep apnea (adult) (pediatric): G47.33

## 2018-10-26 LAB — COMPREHENSIVE METABOLIC PANEL
ALT: 25 U/L (ref 0–44)
AST: 20 U/L (ref 15–41)
Albumin: 1.6 g/dL — ABNORMAL LOW (ref 3.5–5.0)
Alkaline Phosphatase: 90 U/L (ref 38–126)
Anion gap: 4 — ABNORMAL LOW (ref 5–15)
BILIRUBIN TOTAL: 0.5 mg/dL (ref 0.3–1.2)
BUN: 12 mg/dL (ref 8–23)
CHLORIDE: 109 mmol/L (ref 98–111)
CO2: 28 mmol/L (ref 22–32)
CREATININE: 0.84 mg/dL (ref 0.44–1.00)
Calcium: 8.2 mg/dL — ABNORMAL LOW (ref 8.9–10.3)
Glucose, Bld: 93 mg/dL (ref 70–99)
Potassium: 3.6 mmol/L (ref 3.5–5.1)
Sodium: 141 mmol/L (ref 135–145)
TOTAL PROTEIN: 5.3 g/dL — AB (ref 6.5–8.1)

## 2018-10-26 LAB — CBC
HCT: 29.8 % — ABNORMAL LOW (ref 36.0–46.0)
Hemoglobin: 8.7 g/dL — ABNORMAL LOW (ref 12.0–15.0)
MCH: 25.7 pg — ABNORMAL LOW (ref 26.0–34.0)
MCHC: 29.2 g/dL — ABNORMAL LOW (ref 30.0–36.0)
MCV: 87.9 fL (ref 80.0–100.0)
PLATELETS: 454 10*3/uL — AB (ref 150–400)
RBC: 3.39 MIL/uL — ABNORMAL LOW (ref 3.87–5.11)
RDW: 15.4 % (ref 11.5–15.5)
WBC: 15.8 10*3/uL — ABNORMAL HIGH (ref 4.0–10.5)
nRBC: 0.2 % (ref 0.0–0.2)

## 2018-10-26 LAB — CULTURE, RESPIRATORY W GRAM STAIN
Culture: NO GROWTH
Gram Stain: NONE SEEN

## 2018-10-26 LAB — GLUCOSE, CAPILLARY
GLUCOSE-CAPILLARY: 83 mg/dL (ref 70–99)
GLUCOSE-CAPILLARY: 89 mg/dL (ref 70–99)
GLUCOSE-CAPILLARY: 84 mg/dL (ref 70–99)
Glucose-Capillary: 91 mg/dL (ref 70–99)
Glucose-Capillary: 76 mg/dL (ref 70–99)

## 2018-10-26 LAB — PHOSPHORUS: PHOSPHORUS: 2.8 mg/dL (ref 2.5–4.6)

## 2018-10-26 LAB — MAGNESIUM: MAGNESIUM: 2.3 mg/dL (ref 1.7–2.4)

## 2018-10-26 MED ORDER — POTASSIUM CHLORIDE 20 MEQ/15ML (10%) PO SOLN
40.0000 meq | Freq: Once | ORAL | Status: DC
  Filled 2018-10-26: qty 30

## 2018-10-26 MED ORDER — GUAIFENESIN-DM 100-10 MG/5ML PO SYRP
5.0000 mL | ORAL_SOLUTION | ORAL | Status: DC | PRN
  Administered 2018-10-26 – 2018-10-31 (×6): 5 mL via ORAL
  Filled 2018-10-26 (×6): qty 5

## 2018-10-26 MED ORDER — BISACODYL 5 MG PO TBEC: 5.0000 mg | DELAYED_RELEASE_TABLET | Freq: Every day | ORAL | Status: DC | PRN

## 2018-10-26 MED ORDER — POTASSIUM CHLORIDE CRYS ER 20 MEQ PO TBCR
40.0000 meq | EXTENDED_RELEASE_TABLET | Freq: Once | ORAL | Status: AC
  Administered 2018-10-26: 40 meq via ORAL
  Filled 2018-10-26: qty 2

## 2018-10-26 MED ORDER — METRONIDAZOLE 500 MG PO TABS
500.0000 mg | ORAL_TABLET | Freq: Three times a day (TID) | ORAL | Status: DC
  Administered 2018-10-26 – 2018-10-29 (×9): 500 mg via ORAL
  Filled 2018-10-26 (×9): qty 1

## 2018-10-26 MED ORDER — IPRATROPIUM-ALBUTEROL 0.5-2.5 (3) MG/3ML IN SOLN
3.0000 mL | Freq: Three times a day (TID) | RESPIRATORY_TRACT | Status: DC
  Administered 2018-10-26 – 2018-11-01 (×18): 3 mL via RESPIRATORY_TRACT
  Filled 2018-10-26 (×4): qty 3
  Filled 2018-10-26: qty 30
  Filled 2018-10-26 (×14): qty 3

## 2018-10-26 NOTE — Discharge Instructions (Signed)
Thoracotomy, Care After ° °This sheet gives you information about how to care for yourself after your procedure. Your health care provider may also give you more specific instructions. If you have problems or questions, contact your health care provider. °What can I expect after the procedure? °After your procedure, it is common to have: °· Pain and swelling around the incision area. °· Pain when you breathe in (inhale). °· Constipation. °· Fatigue. °· Loss of appetite. °· Trouble sleeping. °· Mood swings and depression. ° °Follow these instructions at home: °Preventing pneumonia °· Take deep breaths or do breathing exercises as instructed by your health care provider. °· Cough frequently. Coughing may cause discomfort, but it is important to clear mucus (phlegm) and expand your lungs. If coughing hurts, hold a pillow against your chest or place both hands flat on top of the incision (splinting) when you cough. This may help relieve discomfort. °· Continue to use an incentive spirometer as directed. This is a tool that measures how well you fill your lungs with each breath. °· Participate in pulmonary rehabilitation as directed. This is a program that combines education, exercise, and support from a team of specialists. The goal is to help you heal and return to normal activities as soon as possible. °Medicines °· Take over-the-counter or prescription medicines only as told by your health care provider. °· If you have pain, take pain-relieving medicine before your pain becomes severe. This is important because if your pain is under control, you will be able to breathe and cough more comfortably. °· If you were prescribed an antibiotic medicine, take it as told by your health care provider. Do not stop taking the antibiotic even if you start to feel better. °Activity °· Ask your health care provider what activities are safe for you. °· Do not travel by airplane for 2 weeks after your chest tube is removed, or until  your health care provider says that this is safe. °· Do not lift anything that is heavier than 10 lb (4.5 kg), or the limit that your health care provider tells you, until he or she says that it is safe. °· Do not drive until your health care provider approves. °? Do not drive or use heavy machinery while taking prescription pain medicine. °Incision care °· Follow instructions from your health care provider about how to take care of your incision. Make sure you: °? Wash your hands with soap and water before you change your bandage (dressing). If soap and water are not available, use hand sanitizer. °? Change your dressing as told by your health care provider. °? Leave stitches (sutures), skin glue, or adhesive strips in place. These skin closures may need to stay in place for 2 weeks or longer. If adhesive strip edges start to loosen and curl up, you may trim the loose edges. Do not remove adhesive strips completely unless your health care provider tells you to do that. °· Keep your dressing dry. °· Check your incision area every day for signs of infection. Check for: °? More redness, swelling, or pain. °? More fluid or blood. °? Warmth. °? Pus or a bad smell. °Bathing °· Do not take baths, swim, or use a hot tub until your health care provider approves. You may take showers. °· After your dressing has been removed, use soap and water to gently wash your incision area. Do not use anything else to clean your incision unless your health care provider tells you to do that. °Eating   and drinking °· Eat a healthy diet as instructed by your health care provider. A healthy diet includes plenty of fresh fruits and vegetables, whole grains, and low-fat (lean) proteins. °· Drink enough fluid to keep your urine clear or pale yellow. °General instructions °· To prevent or treat constipation while you are taking prescription pain medicine, your health care provider may recommend that you: °? Take over-the-counter or prescription  medicines. °? Eat foods that are high in fiber, such as fresh fruits and vegetables, whole grains, and beans. °? Limit foods that are high in fat and processed sugars, such as fried and sweet foods. °· Do not use any products that contain nicotine or tobacco, such as cigarettes and e-cigarettes. If you need help quitting, ask your health care provider. °· Avoid secondhand smoke. °· Wear compression stockings as told by your health care provider. These stockings help to prevent blood clots and reduce swelling in your legs. °· If you have a chest tube, care for it as instructed. °· Keep all follow-up visits as told by your health care provider. This is important. °Contact a health care provider if: °· You have more redness, swelling, or pain around your incision. °· You have more fluid or blood coming from your incision. °· Your incision feels warm to the touch. °· You have pus or a bad smell coming from your incision. °· You have a fever or chills. °· Your heartbeat seems irregular. °· You have nausea or vomiting. °· You have muscle aches. °· You are constipated. This may mean that you have: °? Fewer bowel movements in a week than normal. °? Difficulty having a bowel movement. °? Stools that are dry, hard, or larger than normal. °Get help right away if: °· You develop a rash. °· You feel light-headed or feel like you are going to faint. °· You have shortness of breath or trouble breathing. °· You are confused. °· You have trouble speaking. °· You have vision problems. °· You are not able to move. °· You have numbness in your face, arms, or legs. °· You lose consciousness. °· You have a sudden, severe headache. °· You feel weak. °· You have chest pain. °· You have pain that: °? Is severe. °? Gets worse, even with medicine. °Summary °· To prevent pneumonia, take deep breaths, do breathing exercises, and cough frequently, as instructed by your health care provider. °· Do not drive until your health care provider  approves. Do not travel by airplane for 2 weeks after your chest tube is removed, or until your health care provider approves. °· Check your incision area every day for signs of infection. °· Eat a healthy diet that includes plenty of fresh fruits and vegetables, whole grains, and low-fat (lean) proteins. °This information is not intended to replace advice given to you by your health care provider. Make sure you discuss any questions you have with your health care provider. °Document Released: 05/27/2011 Document Revised: 09/05/2016 Document Reviewed: 09/05/2016 °Elsevier Interactive Patient Education © 2017 [employer] ° °

## 2018-10-26 NOTE — Op Note (Signed)
NAME: Taylor Leon, BAZAR MEDICAL RECORD [id number] ACCOUNT [id number] DATE OF BIRTH:01-15-1956 FACILITY: [facility] LOCATION: [facility] PHYSICIAN:Tran Arzuaga Maryruth Bun, MD  OPERATIVE REPORT  DATE OF PROCEDURE:  10/24/2018  PREOPERATIVE DIAGNOSIS:  Left chest empyema.  POSTOPERATIVE DIAGNOSIS:  Left chest empyema.  SURGICAL PROCEDURE:  Bronchoscopy, left video-assisted thoracoscopy, mini thoracotomy, drainage of empyema and decortication.  SURGEON:  Lanelle Bal, MD  FIRST ASSISTANT:  Nicholes Rough PA.  BRIEF HISTORY:  The patient is a 62 year old diabetic female, who had presented to [place] [facility] several days prior with increasing cough, fever and shortness of breath.  Initial evaluation showed a small pleural effusion on the left, but as the  patient was started on IV antibiotics.  She became increasingly more dyspneic.  The night prior to surgery, the pulmonary service consulted Dr. Caffie Pinto for consideration of drainage of empyema as a repeat CT scan and chest x-ray showed significantly  worsening pleural effusion, ineffective drainage with the thoracentesis.  The patient was transferred to [facility] and prepared for drainage of empyema.  Risks and options of the surgery were discussed with the patient and her husband.  Prior to  surgery, she was very short of breath on supplemental oxygen.  She agreed and signed informed consent.  DESCRIPTION OF PROCEDURE:  The patient was taken to the operating room after placement of central line and an attempted arterial line.  She underwent general endotracheal anesthesia, initially with a double lumen endotracheal tube.  After she was asleep  anesthesia placed a right arterial line.  Appropriate timeout was performed and we proceeded with bronchoscopy through the double lumen endotracheal tube to the subsegmental level on the left and right tracheobronchial tree.  No endobronchial lesions  were noted.  Lavage of the left lower lobe was performed and  submitted as bronchial washings, left lung, submitted for culture.    The patient was then turned on the left lateral decubitus position with the left side up.  A second timeout was performed.  The left chest was prepped with Betadine, draped in the usual sterile manner.  A small incision was made approximately at the  sixth intercostal space, carried down to the rib and the inner pleural space was entered, taking care not to damage the underlying adherent lung.  Through this incision, we were able to slowly break up loculations and again, developing a pleural space.   Significant peel was developing on the lung.  We gradually removed as much of the proteinaceous debris and loculations as possible, removing the peel from the lung.  As we progressed and freed increasing pleural space, 2 port sites were created and  through these, the 30 degree video scope was used to further completely clear the left pleural cavity of loculations through the 2 port sites.  When we completed this, specimens were sent for culture and for histology.  Two Blake mediastinal drains were  left in place placed through the 2 port sites and the incision itself was closed with interrupted 0 Vicryl in the muscle layer, 3-0 Vicryl in the subcuticular stitch and a 3-0 suture in the skin edge.  Dry dressings were applied.    The patient's lung had reinflated.  With her large BMI her preoperative difficulty breathing and initial poor respiratory effort, we decided not to extubate her in the operating room and she was transferred directly to the Surgical Intensive Care unit  for postoperative management.    ESTIMATED BLOOD LOSS:  Estimated blood loss was approximately 200 mL.  She did not require any blood bank blood products.    COUNTS:  Sponge and needle count was reported as correct.    The patient was wanded for radial tagged laps and a clear confirmation number was obtained.  AN/NUANCE  D:10/25/2018 T:10/26/2018  JOB:[id number]

## 2018-10-26 NOTE — Progress Notes (Signed)
Pt states that she does not want to wear a CPAP tonight due to her "coughing spells". RRT noted increase secretion production. Patient has yankauer in hand for oral suctioning. Pt furthermore stated that she thought when she spoke to MD Elsworth Soho that she has to have a sleep study done. Pt states no CPAP tonight.

## 2018-10-26 NOTE — Progress Notes (Signed)
NAME:  Leeasia Secrist, MRN:  [id number], DOB:  1956-07-16, LOS: 6 ADMISSION DATE:  10/19/2018, CONSULTATION DATE:  10/30/219 REFERRING MD:  Dr. Servando Snare, CHIEF COMPLAINT:  Empyema  Brief History   62 yoF admitted to [facility] with left lung pneumonia and pleural effusion with progression of large left effusion.  Left thoracentesis performed on 10/28 with 925m yellowish-green fluid which was exudative by Lights criteria.  Follow-up CXR and chest CT showed progression of left effusion and new loculation with pneumonia in upper and lower lobes, consistent with empyema.  [facility].  Post-op, was not able to extubate secondary to low TVs and thus transferred to ICU on MV. She was extubated  10/31  Past Medical History  Former smoker, quit 2011, morbid obesity, DM, osteoarthritis, mild persistent adult asthma, pneumonia  [facility]   [facility]30 [facility]/ s/p L VATS  Consults: date of consult/date signed off & final recs:  10/30 TCTS >>  Procedures (surgical and bedside):  10/28 L thoracentesis  10/30 L VATS 10/30 ETT >> 10/25/2018 10/30  R radial Aline >> 10/25/2018 10/24/2018 chest tubes x2>>  Significant Diagnostic Tests:  10/25 CTA PE >> 1. Left lower lobe broncho pneumonia with small likely parapneumonic effusion. No CT evidence to suggest empyema. 2. No evidence of acute pulmonary embolus to the lobar level. 3. Enlarged main pulmonary artery and right atrial dilatation suggesting underlying pulmonary arterial hypertension with elevated right heart pressures  10/29 CT chest >> Left pleural effusion is significantly enlarged compared to prior exam and now appears to be loculated, with associated atelectasis or pneumonia involving the left upper and lower lobes. This is concerning for possible empyema.  CXR 10/26/2018>>Right IJ line and left chest tubes in stable position. No pneumothorax. Stable  cardiomegaly. Unchanged bibasilar atelectasis. Unchanged left-sided pleural thickening.   Micro Data:  10/28 GS Left pleural >> negative 10/29 MRSA PCR >> negative 10/26 BC x2 >> ngtd 4 days 10/28 left pleural fluid cx >> 10/30 left bronchial washing GS >> 10/30 Left BAL cx >> 10/30 left pleural GS >>No WBC seen 10/30 left BAL AFB >> 10/30 left BAL fungus >> 10/30 Left pleural AFB >> negative 10/30 left pleural fungal >> 10/30 Left lung AFB >> 10/30 left lung cx >> 10/30 left anaerobic cx >>  Antimicrobials:  10/25 azithro >> 10/29 10/25 ceftriaxone >> 10/29 10/29 cefepime >> 10/29 vancomycin >>  Subjective:  States she is doing better. She still has some pain. Ready to get chest tubes out. States she does not want to wear CPAP with a mask, but will consider a nasal pillow.  Objective   Blood pressure (!) 113/55, pulse 66, temperature 98.6 F (37 C), temperature source Oral, resp. rate 20, height _0  (1.702 m), weight (!) 158.8 kg, SpO2 99 %.        Intake/Output Summary (Last 24 hours) at 10/26/2018 1033 Last data filed at 10/26/2018 0900 Gross per 24 hour  Intake 1539.92 ml  Output 2160 ml  Net -620.08 ml   Filed Weights   10/23/18 2126 10/24/18 0939 10/25/18 0420  Weight: (!) 159.8 kg (!) 159.8 kg (!) 158.8 kg    Examination: General: Morbidly obese female, supine in bed, awake and alert, appropriate HEENT: No lymphadenopathy  Short thick neck syndrome, [place] Neuro: Awake, alert, follows commands, MAE x 4, A&O x 3 CV: s1s2 rrr, no m/r/g, NSR per tele PULM: Bilateral chest excursion, even/non-labored,  lungs bilaterally, L posterior chest tube to 20 cm suction, no leaks noted. L anterior CT pulled today TF:TDDU, non-tender,ND, bsx4 active, Obese  Extremities: warm/dry, 1+ edema, no obvious deformities  Skin: no rashes or lesions, no tattoos   Resolved Hospital Problem list    Assessment & Plan:  Acute respiratory insufficiency post-operatively  Asthma- adult  onset - suspect some form of OSA / OHS in relation morbid obesity P:  Extubated 10/25/2018 Continue albuterol and duonebs Aggressive Pulmonary toilet OOB Ambulate in halls Flutter valve IS Q 1 while awake O2 as needed CXR prn Will need outpatient eval in office with sleep doc to assess/ tx suspected OSA/OHS Will need sleep study for definitive diagnosis   Sepsis - Left PNA/ parapneumonic effusion with progression to Empyema - s/p Left VATS 10/30 - currently hemodynamically stable - CXR shows continued  L;eft effusion - Leukocytosis - T max 98.7 P:  Chest tubes per CVTS>> anterior to be d/c'd, posterior to remain to suction Continue Maxipime and vancomycin Follow cultures of fluid obtained 10/30 DC Foley cath Trend fever and WBC Culture as is clinically indicated  Normocytic Anemia Recent Labs    10/25/18 0325 10/26/18 0433  HGB 9.0* 8.7*  HGB 8.7   P:  Trend CBC Transfuse for HGB < 7  DM CBG (last 3)  Recent Labs    10/25/18 2345 10/26/18 0345 10/26/18 0738  GLUCAP 115* 91 83    P:  Sliding scale insulin Will change to before meals and at bedtime  Renal Slight bump in creatinine  Plan Trend BMET Monitor UO Replete electrolytes prn    Disposition / Summary of Today's Plan 10/26/18   Extubated and doing well on 2 L Tolerating diet, no swallow issues Needs continued Aggressive Pulmonary toilet, IS and Flutter, OOB Will need pulmonary follow up for evaluation of OSA and potential CPAP therapy Will add biscodyl suppository, no BM as of 11/1 One dose of 40 MEq potassium Slight bump in creatinine + 1400 cc Transfer to tele bed and [facility]     Diet: Advance diet Pain/Anxiety/Delirium protocol (if indicated): Stop propofol use PRN fentanyl for pain VAP protocol (if indicated): yes DVT prophylaxis: lovenox (start 10/31) GI prophylaxis: pepcid Hyperglycemia protocol: SSI Mobility: BR Code Status: Full  Family Communication: No family at  bedside.   Labs   CBC: Recent Labs  Lab 10/21/18 0455 10/22/18 0447 10/23/18 0447 10/25/18 0325 10/26/18 0433  WBC 23.2* 21.5* 15.4* 18.1* 15.8*  NEUTROABS 17.5* 15.0* 11.9*  --   --   HGB 11.9* 10.4* 10.1* 9.0* 8.7*  HCT 37.8 35.6* 33.6* 30.6* 29.8*  MCV 84.4 90.1 88.7 87.7 87.9  PLT 373 456* 410* 447* 454*    Basic Metabolic Panel: Recent Labs  Lab 10/21/18 0455 10/22/18 0447 10/23/18 0447 10/25/18 0325 10/26/18 0433  NA 140 139 140 139 141  K 4.4 3.8 4.0 3.8 3.6  CL 107 106 107 107 109  CO2 21* _0 GLUCOSE 115* 131* 117* 187* 93  BUN _1 CREATININE 0.81 0.84 0.76 0.76 0.84  CALCIUM 8.7* 8.5* 8.6* 8.3* 8.2*  MG  --   --   --  2.4 2.3  PHOS  --   --   --  3.4 2.8   GFR: Estimated Creatinine Clearance: 110.2 mL/min (by C-G formula based on SCr of 0.84 mg/dL). Recent Labs  Lab 10/22/18 0447 10/23/18 0447 10/25/18 0325 10/26/18 0433  WBC 21.5* 15.4* 18.1* 15.8*  Liver Function Tests: Recent Labs  Lab 10/25/18 0325 10/26/18 0433  AST  --  20  ALT  --  25  ALKPHOS  --  90  BILITOT  --  0.5  PROT  --  5.3*  ALBUMIN 1.6* 1.6*   No results for input(s): LIPASE, AMYLASE in the last 168 hours. No results for input(s): AMMONIA in the last 168 hours.  ABG    Component Value Date/Time   PHART 7.407 10/25/2018 0340   PCO2ART 43.0 10/25/2018 0340   PO2ART 82.2 (L) 10/25/2018 0340   HCO3 26.5 10/25/2018 0340   TCO2 27 10/24/2018 1615   O2SAT 95.9 10/25/2018 0340     Coagulation Profile: No results for input(s): INR, PROTIME in the last 168 hours.  Cardiac Enzymes: No results for input(s): CKTOTAL, CKMB, CKMBINDEX, TROPONINI in the last 168 hours.  HbA1C: Hemoglobin A1C  Date/Time Value Ref Range Status  10/10/2018 08:24 AM 5.6 4.0 - 5.6 % Final  03/14/2018 10:13 AM 5.6  Final   Hgb A1c MFr Bld  Date/Time Value Ref Range Status  06/20/2017 10:30 AM 6.2 4.6 - 6.5 % Final    Comment:    Glycemic Control Guidelines for  People with Diabetes:Non Diabetic:  <6%Goal of Therapy: <7%Additional Action Suggested:  >8%   06/22/2016 08:42 AM 6.0 4.6 - 6.5 % Final    Comment:    Glycemic Control Guidelines for People with Diabetes:Non Diabetic:  <6%Goal of Therapy: <7%Additional Action Suggested:  >8%     CBG: Recent Labs  Lab 10/25/18 1629 10/25/18 1943 10/25/18 2345 10/26/18 0345 10/26/18 0738  GLUCAP 110* 124* 115* 91 83    Admitting History of Present Illness.   62 year old female with PMH significant for morbid obesity, adult onset asthma, type 2 diabetes who presented to [facility] 10/25 with acute onset of left sided chest pain with cough, shortness of breath, and subjective fevers.  Seen by PCP on 10/21 and treated with zpak and prednisone without improvement in symptoms.  CXR showed left lower pneumonia and small pleural effusion.  CT chest PE negative for PE, but showed small parapneumonic effusion.  She was admitted to [facility] with sepsis and started on Zithromax and ceftriaxone.  A left thoracentesis performed on 10/28 with 970 ml of yellow-green pleural fluid, WBC 690, 87 PMN, LDH 985, exudate effusion by Light's criteria.  Repeat CT chest showed progression of left effusion with loculation, with pneumonia in left upper and lower lobes, consistent with empyema.  Antibiotic coverage changed to vancomycin and cefepime on 10/29.  Blood cultures with no growth to date. She has remained hemodynamically stable with minimal 2-4 L  O2 requirement.  She was transferred to [facility] 10/30 for evaluation by TCTS.   Patient underwent a left VATS by Dr. Servando Snare 10/30.  She was unable to be extubated post operatively secondary to low tidal volumes and therefore returns to ICU on mechanical ventilation.  She was successfully extubated 10/31.    Past Medical History  She,  has a past medical history of BPPV (benign paroxysmal positional vertigo) (01/30/2014), Cyst, breast (01/2012; 06/2012; 02/26/13; 03/03/14), Diabetes mellitus without  complication ([place]), H/O allergic rhinitis, History of pneumonia (2009 and 2010), Mild persistent asthma, well controlled (2014), Morbid obesity ([place]), Nephrolithiasis (04/2018 first episode), Osteoarthritis of both knees, Prediabetes (2016), and Recurrent low back pain.   Surgical History    Past Surgical History:  Procedure Laterality Date  . APPENDECTOMY  1978  . CARDIAC CATHETERIZATION  2010  Normal coronaries per pt  . CARDIOVASCULAR STRESS TEST  2010   abnl per pt; f/u cath clean  . CESAREAN SECTION     x 3  . CHOLECYSTECTOMY  1986  . COLONOSCOPY  2008   normal per pt report (in [place])  . EMPYEMA DRAINAGE Left 10/24/2018   Procedure: EMPYEMA DRAINAGE;  Surgeon: Grace Isaac, MD;  Location: [place];  Service: Thoracic;  Laterality: Left;  . LUMBAR DISC SURGERY  1993   L5/S1  . TONSILLECTOMY AND ADENOIDECTOMY  1967ish  . TOTAL ABDOMINAL HYSTERECTOMY  1994   Ovaries are still in.  This was done for DUB/fibroids.  Marland Kitchen VIDEO ASSISTED THORACOSCOPY (VATS)/THOROCOTOMY Left 10/24/2018   Procedure: VIDEO ASSISTED THORACOSCOPY (VATS)/THOROCOTOMY with EVACUATION OF EMPYEMA AND DECORTICATION.;  Surgeon: Grace Isaac, MD;  Location: [place];  Service: Thoracic;  Laterality: Left;  Marland Kitchen VIDEO BRONCHOSCOPY N/A 10/24/2018   Procedure: VIDEO BRONCHOSCOPY;  Surgeon: Grace Isaac, MD;  Location: [facility] OR;  Service: Thoracic;  Laterality: N/A;     Social History   Social History   Socioeconomic History  . Marital status: Married    Spouse name: Not on file  . Number of children: 4  . Years of education: Not on file  . Highest education level: Not on file  Occupational History  . Not on file  Social Needs  . Financial resource strain: Not on file  . Food insecurity:    Worry: Not on file    Inability: Not on file  . Transportation needs:    Medical: Not on file    Non-medical: Not on file  Tobacco Use  . Smoking status: Former Smoker    Types: Cigarettes    Last attempt to  quit: 12/28/2009    Years since quitting: 8.8  . Smokeless tobacco: Never Used  Substance and Sexual Activity  . Alcohol use: Yes    Comment: social  . Drug use: No  . Sexual activity: Not on file  Lifestyle  . Physical activity:    Days per week: Not on file    Minutes per session: Not on file  . Stress: Not on file  Relationships  . Social connections:    Talks on phone: Not on file    Gets together: Not on file    Attends religious service: Not on file    Active member of club or organization: Not on file    Attends meetings of clubs or organizations: Not on file    Relationship status: Not on file  . Intimate partner violence:    Fear of current or ex partner: Not on file    Emotionally abused: Not on file    Physically abused: Not on file    Forced sexual activity: Not on file  Other Topics Concern  . Not on file  Social History Narrative   Married, 4 grown children.   Relocated to [place], [place] from [place] about 2012.   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   Exercise: intermittently does water aerobics..           ,  reports that she quit smoking about 8 years ago. Her smoking use included cigarettes. She has never used smokeless tobacco. She reports that she drinks alcohol. She reports that she does not use drugs.   Family History   Her family history includes Arthritis in her mother and paternal grandmother; Diabetes in her  mother; Heart disease (age of onset: 71) in her mother; Hypertension in her mother.   Allergies Allergies  Allergen Reactions  . Penicillins Nausea Only and Rash    Has patient had a PCN reaction causing immediate rash, facial/tongue/throat swelling, SOB or lightheadedness with hypotension: yES Has patient had a PCN reaction causing severe rash involving mucus membranes or skin necrosis: No Has patient had a PCN reaction that required hospitalization: No Has patient had  a PCN reaction occurring within the last 10 years: No If all of the above answers are "NO", then may proceed with Cephalosporin use. CC     Home Medications  Prior to Admission medications   Medication Sig Start Date End Date Taking? Authorizing Provider  albuterol (PROVENTIL) (2.5 MG/3ML) 0.083% nebulizer solution Take 3 mLs (2.5 mg total) by nebulization every 6 (six) hours as needed for wheezing. 10/15/18  Yes McGowen, Adrian Blackwater, MD  albuterol (VENTOLIN HFA) 108 (90 Base) MCG/ACT inhaler Inhale 2 puffs into the lungs every 4 (four) hours as needed for wheezing. 10/15/18 12/19/20 Yes McGowen, Adrian Blackwater, MD  ascorbic acid (VITAMIN C) 1000 MG tablet Take 1,000 mg by mouth daily.   Yes [provider]  azithromycin (ZITHROMAX) 250 MG tablet 2 tabs po qd x 1d, then 1 tab po qd x 4d 10/15/18  Yes McGowen, Adrian Blackwater, MD  BAYER MICROLET LANCETS lancets Use to check blood sugar once daily 12/13/17  Yes McGowen, Adrian Blackwater, MD  Cholecalciferol (VITAMIN D-3 PO) Take 1 tablet by mouth daily.   Yes [provider]  glucose blood (CONTOUR NEXT TEST) test strip Use to check blood sugar once daily 12/13/17  Yes McGowen, Adrian Blackwater, MD  meloxicam (MOBIC) 7.5 MG tablet 1-2 tabs po qd prn arthritis pain 11/02/17  Yes McGowen, Adrian Blackwater, MD  metFORMIN (GLUCOPHAGE) 1000 MG tablet 1 tab po bid with meals 10/15/18  Yes McGowen, Adrian Blackwater, MD  Misc Natural Products (TURMERIC CURCUMIN) CAPS Take 1 capsule by mouth 3 (three) times a week. Monday, Wednesday, and Friday.   Yes [provider]  Multiple Vitamin (MULTIVITAMIN) capsule Take 1 capsule by mouth daily.   Yes [provider]  naproxen sodium (ALEVE) 220 MG tablet Take 220 mg by mouth daily as needed (arthritis pain.).    Yes [provider]  predniSONE (DELTASONE) 20 MG tablet 3 tabs po qd x 2d, then 2 tabs po qd x 4d, then 1 tab po qd x 3d, then 1/2 tab po qd x 2d 10/15/18  Yes McGowen, Adrian Blackwater, MD  beclomethasone (QVAR  REDIHALER) 80 MCG/ACT inhaler Inhale 2 puffs into the lungs 2 (two) times daily. Patient not taking: Reported on 10/19/2018 10/15/18   Tammi Sou, MD  beclomethasone (QVAR) 80 MCG/ACT inhaler Inhale 2 puffs into the lungs 2 (two) times daily as needed. Patient not taking: Reported on 10/19/2018 12/06/16   Tammi Sou, MD     Critical care time:  61 minutes    Magdalen Spatz, AGACNP-BC [place] Pager # [phone] If no answer, [phone] 10/26/2018, 10:33 AM

## 2018-10-26 NOTE — Progress Notes (Signed)
Patient arrived from [place] to 4E room 3 two days postop VATS for empyema by Dr. Servando Snare.  Telemetry monitor applied and CCMD notified.  Patient oriented to unit and room to include call light and phone.  Will continue to monitor.

## 2018-10-26 NOTE — Progress Notes (Signed)
Patient ID: Taylor Leon, female   DOB: 1956/08/10, 62 y.o.   MRN: [id number] TCTS DAILY ICU PROGRESS NOTE                   [address]            [place] [address]          [phone]   2 Days Post-Op Procedure(s) (LRB): VIDEO ASSISTED THORACOSCOPY (VATS)/THOROCOTOMY with EVACUATION OF EMPYEMA AND DECORTICATION. (Left) EMPYEMA DRAINAGE (Left) VIDEO BRONCHOSCOPY (N/A)  Total Length of Stay:  LOS: 6 days   Subjective: She is coughing a lot and it hurts her stomach. She isn't eating much yet.   Objective: Vital signs in last 24 hours: Temp:  [97.8 F (36.6 C)-98.7 F (37.1 C)] 98.6 F (37 C) (11/01 0347) Pulse Rate:  [56-89] 66 (11/01 0600) Cardiac Rhythm: Normal sinus rhythm (11/01 0400) Resp:  [16-29] 20 (11/01 0600) BP: (99-132)/(46-78) 113/55 (11/01 0600) SpO2:  [87 %-100 %] 99 % (11/01 0808)  Filed Weights   10/23/18 2126 10/24/18 0939 10/25/18 0420  Weight: (!) 159.8 kg (!) 159.8 kg (!) 158.8 kg    Weight change:       Intake/Output from previous day: 10/31 0701 - 11/01 0700 In: 1173.7 [I.V.:146.9; IV Piggyback:1026.8] Out: 2255 [Urine:2155; Chest Tube:100]  Intake/Output this shift: No intake/output data recorded.  Current Meds: Scheduled Meds: . acetaminophen  1,000 mg Oral Q6H   Or  . acetaminophen (TYLENOL) oral liquid 160 mg/5 mL  1,000 mg Oral Q6H  . bisacodyl  10 mg Oral Daily  . chlorhexidine gluconate (MEDLINE KIT)  15 mL Mouth Rinse BID  . enoxaparin (LOVENOX) injection  40 mg Subcutaneous Daily  . insulin aspart  0-15 Units Subcutaneous TID WC  . ipratropium-albuterol  3 mL Nebulization TID  . senna-docusate  1 tablet Oral QHS   Continuous Infusions: . sodium chloride Stopped (10/25/18 0330)  . ceFEPime (MAXIPIME) IV Stopped (10/26/18 0347)  . famotidine (PEPCID) IV Stopped (10/26/18 0016)  . potassium chloride    . vancomycin 250 mL/hr at 10/26/18 0700   PRN Meds:.sodium chloride, albuterol, bisacodyl, docusate,  ketorolac, oxyCODONE-acetaminophen, potassium chloride  General appearance: alert, cooperative and no distress Heart: regular rate and rhythm, S1, S2 normal, no murmur, click, rub or gallop Lungs: clear to auscultation bilaterally and diminished in the lower lobes Abdomen: soft, non-tender; bowel sounds normal; no masses,  no organomegaly Extremities: extremities normal, atraumatic, no cyanosis or edema Wound: clean and dry dressed in a sterile dressing  Lab Results: CBC: Recent Labs    10/25/18 0325 10/26/18 0433  WBC 18.1* 15.8*  HGB 9.0* 8.7*  HCT 30.6* 29.8*  PLT 447* 454*   BMET:  Recent Labs    10/25/18 0325 10/26/18 0433  NA 139 141  K 3.8 3.6  CL 107 109  CO2 26 28  GLUCOSE 187* 93  BUN 8 12  CREATININE 0.76 0.84  CALCIUM 8.3* 8.2*    CMET: Lab Results  Component Value Date   WBC 15.8 (H) 10/26/2018   HGB 8.7 (L) 10/26/2018   HCT 29.8 (L) 10/26/2018   PLT 454 (H) 10/26/2018   GLUCOSE 93 10/26/2018   CHOL 199 06/20/2017   TRIG 148 10/24/2018   HDL 84.00 06/20/2017   LDLCALC 92 06/20/2017   ALT 25 10/26/2018   AST 20 10/26/2018   NA 141 10/26/2018   K 3.6 10/26/2018   CL 109 10/26/2018   CREATININE 0.84 10/26/2018   BUN  12 10/26/2018   CO2 28 10/26/2018   TSH 1.09 06/20/2017   HGBA1C 5.6 10/10/2018   Recent Results (from the past 240 hour(s))  Culture, blood (routine x 2)     Status: None   Collection Time: 10/20/18  2:30 PM  Result Value Ref Range Status   Specimen Description   Final    BLOOD RIGHT HAND Performed at [facility] [address], [place], [place] [address]    Special Requests   Final    AEB BCAV Performed at [facility] [address], [place], [place] [address]    Culture   Final    NO GROWTH 5 DAYS Performed at [facility], [place] [address] [place], [place] [address]    Report Status 10/25/2018 FINAL  Final  Culture, blood (routine x 2)     Status: None   Collection Time:  10/20/18  2:30 PM  Result Value Ref Range Status   Specimen Description   Final    BLOOD RIGHT HAND Performed at [facility] [address] [place], [place] [address]    Special Requests   Final    AEB BCAV Performed at [facility] [address] [place], [place] [address]    Culture   Final    NO GROWTH 5 DAYS Performed at [facility], [place] [address] [place], [place] [address]    Report Status 10/25/2018 FINAL  Final  Culture, body fluid-bottle     Status: None (Preliminary result)   Collection Time: 10/22/18  4:21 PM  Result Value Ref Range Status   Specimen Description FLUID PLEURAL LEFT  Final   Special Requests BOTTLES DRAWN AEROBIC AND ANAEROBIC  Final   Culture   Final    NO GROWTH 3 DAYS Performed at [facility], [place] [address] [place], [place] [address]    Report Status PENDING  Incomplete  Gram stain     Status: None   Collection Time: 10/22/18  4:21 PM  Result Value Ref Range Status   Specimen Description FLUID PLEURAL LEFT  Final   Special Requests NONE  Final   Gram Stain   Final    WBC PRESENT,BOTH PMN AND MONONUCLEAR NO ORGANISMS SEEN CYTOSPIN SMEAR Performed at [facility], [address], [place], [place] [address]    Report Status 10/22/2018 FINAL  Final  MRSA PCR Screening     Status: None   Collection Time: 10/23/18  9:25 PM  Result Value Ref Range Status   MRSA by PCR NEGATIVE NEGATIVE Final    Comment:        The GeneXpert MRSA Assay (FDA approved for NASAL specimens only), is one component of a comprehensive MRSA colonization surveillance program. It is not intended to diagnose MRSA infection nor to guide or monitor treatment for MRSA infections. Performed at [facility], [place] [address] [place], [place] [address]   Culture, respiratory     Status: None (Preliminary result)   Collection Time: 10/24/18 11:26 AM  Result Value Ref Range Status   Specimen Description  BRONCHIAL WASHINGS  Final   Special Requests PATIENT ON FOLLOWING CEFEPIME VANC ROCEPHIN  Final   Gram Stain NO WBC SEEN NO ORGANISMS SEEN   Final   Culture   Final    NO GROWTH < 24 HOURS Performed at [facility], [place] [address] [place], [place] [address]    Report Status  PENDING  Incomplete  Acid Fast Smear (AFB)     Status: None   Collection Time: 10/24/18 11:26 AM  Result Value Ref Range Status   AFB Specimen Processing Concentration  Final   Acid Fast Smear Negative  Final    Comment: (NOTE) Performed At: [facility] [place], [place] [id number] Rush Farmer MD [id number]    Source (AFB) BRONCHIAL WASHINGS  Corrected    Comment: Performed at [facility], [place] [address] [place], [place] [address] CORRECTED ON 10/30 AT 1414: PREVIOUSLY REPORTED AS BRONCHIAL ALVEOLAR LAVAGE LEFT   Body fluid culture     Status: None (Preliminary result)   Collection Time: 10/24/18 12:19 PM  Result Value Ref Range Status   Specimen Description FLUID PLEURAL LEFT  Final   Special Requests PATIENT ON FOLLOWING CEFEPIME VANC ROCEPHIN  Final   Gram Stain   Final    FEW WBC PRESENT, PREDOMINANTLY PMN NO ORGANISMS SEEN    Culture   Final    NO GROWTH < 24 HOURS Performed at [facility], [place] [address] [place], [place] [address]    Report Status PENDING  Incomplete  Acid Fast Smear (AFB)     Status: None   Collection Time: 10/24/18 12:19 PM  Result Value Ref Range Status   AFB Specimen Processing Concentration  Final   Acid Fast Smear Negative  Final    Comment: (NOTE) Performed At: [facility] [place], [place] [id number] Rush Farmer MD [id number]    Source (AFB) PLEURAL  Final    Comment: LEFT Performed at [facility], [place] [address] [place], [place] [address]   Body fluid culture     Status: None (Preliminary result)   Collection Time: 10/24/18 12:21 PM  Result Value Ref Range Status   Specimen  Description PLEURAL LEFT 2  Final   Special Requests PATIENT ON FOLLOWING CEFEPIME VANC ROCEPHIN  Final   Gram Stain NO WBC SEEN NO ORGANISMS SEEN   Final   Culture   Final    NO GROWTH < 24 HOURS Performed at [facility], [place] [address] [place], [place] [address]    Report Status PENDING  Incomplete  Acid Fast Smear (AFB)     Status: None   Collection Time: 10/24/18 12:21 PM  Result Value Ref Range Status   AFB Specimen Processing Concentration  Final   Acid Fast Smear Negative  Final    Comment: (NOTE) Performed At: [facility] [place], [place] [id number] Rush Farmer MD [id number]    Source (AFB) PLEURAL  Final    Comment: LEFT 2 Performed at [facility], [place] [address], [place], [place] [address]   Aerobic/Anaerobic Culture (surgical/deep wound)     Status: None (Preliminary result)   Collection Time: 10/24/18 12:23 PM  Result Value Ref Range Status   Specimen Description TISSUE PLEURAL LEFT  Final   Special Requests PATIENT ON FOLLOWING CEFEPIME ROCEPHIN VANC  Final   Gram Stain   Final    RARE WBC PRESENT, PREDOMINANTLY PMN RARE GRAM NEGATIVE RODS    Culture   Final    NO GROWTH 1 DAY Performed at [facility], [place] [address] [place],  [address]    Report Status PENDING  Incomplete  Acid Fast Smear (AFB)     Status: None   Collection Time: 10/24/18 12:23 PM  Result Value Ref Range Status   AFB  Specimen Processing Comment  Final    Comment: Tissue Grinding and Digestion/Decontamination   Acid Fast Smear Negative  Final    Comment: (NOTE) Performed At: [facility] [place], [place] [id number] Rush Farmer MD [id number]    Source (AFB) TISSUE  Final    Comment: PLEURAL LEFT Performed at [facility], [place] [address], [place], [place] [address]      PT/INR: No results for input(s): LABPROT, INR in the last 72 hours. Radiology: No results  found.   Assessment/Plan: S/P Procedure(s) (LRB): VIDEO ASSISTED THORACOSCOPY (VATS)/THOROCOTOMY with EVACUATION OF EMPYEMA AND DECORTICATION. (Left) EMPYEMA DRAINAGE (Left) VIDEO BRONCHOSCOPY (N/A)  1. CV-NSR in the 60s. SB at times. BP well controlled.  2. Pulm- remove anterior chest tube this morning. CXR is stable. She is tolerating 2L [place] with good oxygen saturation 3. Renal-creatinine is 0.84, electrolytes okay 4. H and H stable 8.7/29.8, expected acute blood loss anemia 5. Endo-blood glucose well controlled on current regimen   Plan: Discontinue foley catheter. Ambulate in the halls today. Discontinue anterior chest tube. Continue Antibiotics. Will continue to follow cultures from OR. Encouraged incentive spirometer. Will add stool softeners.   Nicholes Rough, PA-C     Grace Isaac 10/26/2018 8:17 AM

## 2018-10-27 ENCOUNTER — Inpatient Hospital Stay (HOSPITAL_COMMUNITY): Payer: Self-pay

## 2018-10-27 LAB — CULTURE, BODY FLUID W GRAM STAIN -BOTTLE: Culture: NO GROWTH

## 2018-10-27 LAB — BASIC METABOLIC PANEL
Anion gap: 5 (ref 5–15)
BUN: 7 mg/dL — ABNORMAL LOW (ref 8–23)
CO2: 28 mmol/L (ref 22–32)
Calcium: 8.4 mg/dL — ABNORMAL LOW (ref 8.9–10.3)
Chloride: 109 mmol/L (ref 98–111)
Creatinine, Ser: 0.79 mg/dL (ref 0.44–1.00)
GFR calc Af Amer: >60 mL/min (ref >60)
GFR calc non Af Amer: >60 mL/min (ref >60)
Glucose, Bld: 103 mg/dL — ABNORMAL HIGH (ref 70–99)
Potassium: 4 mmol/L (ref 3.5–5.1)
Sodium: 142 mmol/L (ref 135–145)

## 2018-10-27 LAB — CBC
HCT: 30.9 % — ABNORMAL LOW (ref 36.0–46.0)
Hemoglobin: 9.2 g/dL — ABNORMAL LOW (ref 12.0–15.0)
MCH: 25.9 pg — ABNORMAL LOW (ref 26.0–34.0)
MCHC: 29.8 g/dL — ABNORMAL LOW (ref 30.0–36.0)
MCV: 87 fL (ref 80.0–100.0)
Platelets: 451 10*3/uL — ABNORMAL HIGH (ref 150–400)
RBC: 3.55 MIL/uL — ABNORMAL LOW (ref 3.87–5.11)
RDW: 15.5 % (ref 11.5–15.5)
WBC: 13.5 10*3/uL — ABNORMAL HIGH (ref 4.0–10.5)
nRBC: 0 % (ref 0.0–0.2)

## 2018-10-27 LAB — CULTURE, BODY FLUID-BOTTLE

## 2018-10-27 LAB — GLUCOSE, CAPILLARY
GLUCOSE-CAPILLARY: 103 mg/dL — AB (ref 70–99)
Glucose-Capillary: 98 mg/dL (ref 70–99)
Glucose-Capillary: 95 mg/dL (ref 70–99)
Glucose-Capillary: 97 mg/dL (ref 70–99)

## 2018-10-27 MED ORDER — MENTHOL 3 MG MT LOZG
1.0000 | LOZENGE | OROMUCOSAL | Status: DC | PRN
  Administered 2018-10-31: 3 mg via ORAL
  Filled 2018-10-27: qty 9

## 2018-10-27 NOTE — Progress Notes (Signed)
PROGRESS NOTE  Taylor Leon  CHY:[id number] DOB: [birth year]  DOA: 10/19/2018 PCP: Tammi Sou, MD   Brief Narrative:  62 year old female with PMH significant for morbid obesity, adult onset asthma, type 2 diabetes who   Was admitted to the hospital on 10/19/2018 with comment acquired pneumonia and small parapneumonic effusion, with sepsis.    She was admitted to [facility] and started on Zithromax and ceftriaxone.  A left thoracentesis performed on 10/28 with 970 ml of yellow-green pleural fluid, WBC 690, 87 PMN, LDH 985, exudate effusion by Light's criteria.   Repeat CT chest showed progression of left effusion with loculation, with pneumonia in left upper and lower lobes, consistent with empyema.  Antibiotic coverage changed to vancomycin and cefepime on 10/29.  Blood cultures with no growth to date. She has remained hemodynamically stable with minimal 2-4 L [place] O2 requirement.  She was transferred to [facility] 10/30 for evaluation by TCTS.    Patient underwent a left VATS by Dr. Servando Snare 10/30.  She was unable to be extubated post operatively secondary to low tidal volumes and therefore returns to ICU on mechanical ventilation, and switched to PCCM service. She was successfully extubated on 10/25/2018.     Assessment & Plan:   Active Problems:   Asthmatic bronchitis   DJD (degenerative joint disease) of knee   CAP (community acquired pneumonia)   Hypoxemia   Precordial chest pain   Obesity, Class III, BMI 40-49.9 (morbid obesity) (HCC)   Type 2 diabetes mellitus with hyperlipidemia (HCC)   Empyema of pleural space (HCC)     Pneumonia/empyema History of adult-onset asthma In the setting of ? OSA/OHS, and Morbid Obesity  continue pulmonary toileting measures  mobilize  follow up on cultures, WBC and fever care  continue antibiotics  supportive care  Sleep study for definitive diagnosis   normocytic anemia  Monitor and transfuse PRBC as needed  diabetes mellitus type 2 Glycemic  control with insulin   severe protein calorie malnutrition Nutritional support and optimization    DVT prophylaxis: Lovenox Code Status: Full Family Communication:  Disposition Plan: TBD   Consultants:   PCCM  cardiothoracic surgery  Procedures:  10/28 L thoracentesis  10/30 L VATS 10/30 ETT >> 10/25/2018 10/30  R radial Aline >> 10/25/2018 10/24/2018 chest tubes x2>>  Antimicrobials:  Maxipine, Flagyl   Subjective: No acute events reported overnight.  No fever or chills.  Last chest tube to be removed today  Objective:  Vitals:   10/27/18 1500 10/27/18 1617 10/27/18 1638 10/27/18 1956  BP: (!) 122/57   (!) 77/65  Pulse: 64 85 69 79  Resp: (!) 24  (!) 27 (!) 29  Temp:    98.6 F (37 C)  TempSrc:    Oral  SpO2: 100% (!) 87% 99% 97%  Weight:      Height:        Intake/Output Summary (Last 24 hours) at 10/27/2018 1959 Last data filed at 10/27/2018 1600 Gross per 24 hour  Intake 1000 ml  Output 4320 ml  Net -3320 ml   Filed Weights   10/24/18 0939 10/25/18 0420 10/27/18 0342  Weight: (!) 159.8 kg (!) 158.8 kg (!) 153.2 kg    Examination:  General exam:  No acute distress, on oxygen by nasal cannula Respiratory system:  Diminished breath sounds. Respiratory effort normal. Cardiovascular system: S1 & S2 heard, RRR. No JVD, murmurs, rubs, gallops or clicks. No pedal edema. Gastrointestinal system: Abdomen is nondistended, soft and nontender. No organomegaly  or masses felt. Normal bowel sounds heard. Central nervous system: Alert and oriented. No focal neurological deficits. Extremities: Symmetric 5 x 5 power. Skin: No rashes, lesions or ulcers Psychiatry: Judgement and insight appear normal. Mood & affect appropriate.     Data Reviewed: I have personally reviewed following labs and imaging studies  CBC: Recent Labs  Lab 10/21/18 0455 10/22/18 0447 10/23/18 0447 10/25/18 0325 10/26/18 0433 10/27/18 0535  WBC 23.2* 21.5* 15.4* 18.1* 15.8* 13.5*    NEUTROABS 17.5* 15.0* 11.9*  --   --   --   HGB 11.9* 10.4* 10.1* 9.0* 8.7* 9.2*  HCT 37.8 35.6* 33.6* 30.6* 29.8* 30.9*  MCV 84.4 90.1 88.7 87.7 87.9 87.0  PLT 373 456* 410* 447* 454* 275*   Basic Metabolic Panel: Recent Labs  Lab 10/22/18 0447 10/23/18 0447 10/25/18 0325 10/26/18 0433 10/27/18 0535  NA 139 140 139 141 142  K 3.8 4.0 3.8 3.6 4.0  CL 106 107 107 109 109  CO2 24 24 26 28 28   GLUCOSE 131* 117* 187* 93 103*  BUN 12 8 8 12  7*  CREATININE 0.84 0.76 0.76 0.84 0.79  CALCIUM 8.5* 8.6* 8.3* 8.2* 8.4*  MG  --   --  2.4 2.3  --   PHOS  --   --  3.4 2.8  --    GFR: Estimated Creatinine Clearance: 113 mL/min (by C-G formula based on SCr of 0.79 mg/dL). Liver Function Tests: Recent Labs  Lab 10/25/18 0325 10/26/18 0433  AST  --  20  ALT  --  25  ALKPHOS  --  90  BILITOT  --  0.5  PROT  --  5.3*  ALBUMIN 1.6* 1.6*   No results for input(s): LIPASE, AMYLASE in the last 168 hours. No results for input(s): AMMONIA in the last 168 hours. Coagulation Profile: No results for input(s): INR, PROTIME in the last 168 hours. Cardiac Enzymes: No results for input(s): CKTOTAL, CKMB, CKMBINDEX, TROPONINI in the last 168 hours. BNP (last 3 results) No results for input(s): PROBNP in the last 8760 hours. HbA1C: No results for input(s): HGBA1C in the last 72 hours. CBG: Recent Labs  Lab 10/26/18 1605 10/26/18 2203 10/27/18 0608 10/27/18 1143 10/27/18 1637  GLUCAP 89 84 98 95 103*   Lipid Profile: Recent Labs    10/24/18 2152  TRIG 148   Thyroid Function Tests: No results for input(s): TSH, T4TOTAL, FREET4, T3FREE, THYROIDAB in the last 72 hours. Anemia Panel: No results for input(s): VITAMINB12, FOLATE, FERRITIN, TIBC, IRON, RETICCTPCT in the last 72 hours.  Sepsis Labs: Recent Labs  Lab 10/23/18 0447 10/25/18 0325 10/26/18 0433 10/27/18 0535  WBC 15.4* 18.1* 15.8* 13.5*    Recent Results (from the past 240 hour(s))  Culture, blood (routine x 2)      Status: None   Collection Time: 10/20/18  2:30 PM  Result Value Ref Range Status   Specimen Description   Final    BLOOD RIGHT HAND Performed at [facility] [address] [place], [place] [address]    Special Requests   Final    AEB BCAV Performed at [facility] [address], [place], [place] [address]    Culture   Final    NO GROWTH 5 DAYS Performed at [facility], [place] [address] [place], [place] [address]    Report Status 10/25/2018 FINAL  Final  Culture, blood (routine x 2)     Status: None   Collection  Time: 10/20/18  2:30 PM  Result Value Ref Range Status   Specimen Description   Final    BLOOD RIGHT HAND Performed at [facility] [address] [place], [place] [address]    Special Requests   Final    AEB BCAV Performed at [facility] [address], [place], [place] [address]    Culture   Final    NO GROWTH 5 DAYS Performed at [facility], [place] [address], [place], [address] [address]    Report Status 10/25/2018 FINAL  Final  Culture, body fluid-bottle     Status: None   Collection Time: 10/22/18  4:21 PM  Result Value Ref Range Status   Specimen Description FLUID PLEURAL LEFT  Final   Special Requests BOTTLES DRAWN AEROBIC AND ANAEROBIC  Final   Culture   Final    NO GROWTH 5 DAYS Performed at [facility], [place] [address] [place], [place] [address]    Report Status 10/27/2018 FINAL  Final  Gram stain     Status: None   Collection Time: 10/22/18  4:21 PM  Result Value Ref Range Status   Specimen Description FLUID PLEURAL LEFT  Final   Special Requests NONE  Final   Gram Stain   Final    WBC PRESENT,BOTH PMN AND MONONUCLEAR NO ORGANISMS SEEN CYTOSPIN SMEAR Performed at [facility], [address] [place], [place] [address]    Report Status 10/22/2018 FINAL  Final  MRSA PCR Screening     Status: None   Collection Time: 10/23/18  9:25 PM    Result Value Ref Range Status   MRSA by PCR NEGATIVE NEGATIVE Final    Comment:        The GeneXpert MRSA Assay (FDA approved for NASAL specimens only), is one component of a comprehensive MRSA colonization surveillance program. It is not intended to diagnose MRSA infection nor to guide or monitor treatment for MRSA infections. Performed at [facility], [place] [address], [place], [place] [address]   Fungus Culture With Stain     Status: None   Collection Time: 10/24/18 11:26 AM  Result Value Ref Range Status   Fungus Stain Final report  Final    Comment: (NOTE) Performed At: [facility] [place], [place] [id number] Rush Farmer MD [id number]:[id number]    Fungus (Mycology) Culture PENDING  Incomplete   Fungal Source BRONCHIAL WASHINGS  Corrected    Comment: Performed at [facility], [place] [address], [place], [place] [address] CORRECTED ON 10/30 AT 1414: PREVIOUSLY REPORTED AS BRONCHIAL ALVEOLAR LAVAGE LEFT   Culture, respiratory     Status: None   Collection Time: 10/24/18 11:26 AM  Result Value Ref Range Status   Specimen Description BRONCHIAL WASHINGS  Final   Special Requests PATIENT ON FOLLOWING CEFEPIME VANC ROCEPHIN  Final   Gram Stain NO WBC SEEN NO ORGANISMS SEEN   Final   Culture   Final    NO GROWTH 2 DAYS Performed at [facility], [place] [address], [place],  [address]    Report Status 10/26/2018 FINAL  Final  Acid Fast Smear (AFB)     Status: None   Collection Time: 10/24/18 11:26 AM  Result Value Ref Range Status   AFB Specimen Processing Concentration  Final   Acid Fast Smear Negative  Final    Comment: (NOTE) Performed At: [facility] [address] [place], [place] [id number]  Rush Farmer MD [id number]    Source (AFB) BRONCHIAL WASHINGS  Corrected    Comment: Performed at [facility], [place] [address], [place], [place] [address] CORRECTED ON 10/30 AT 1414: PREVIOUSLY REPORTED AS BRONCHIAL  ALVEOLAR LAVAGE LEFT   Fungus Culture Result     Status: None   Collection Time: 10/24/18 11:26 AM  Result Value Ref Range Status   Result 1 Comment  Final    Comment: (NOTE) KOH/Calcofluor preparation:  no fungus observed. Performed At: [facility] [place], [place] [id number] Rush Farmer MD [id number]   Anaerobic culture     Status: None (Preliminary result)   Collection Time: 10/24/18 12:19 PM  Result Value Ref Range Status   Specimen Description FLUID PLEURAL LEFT  Final   Special Requests   Final    PATIENT ON FOLLOWING CEFEPIME VANC ROCEPHIN Performed at [facility], [place] [address], [place], [place] [address]    Culture   Final    NO ANAEROBES ISOLATED; CULTURE IN PROGRESS FOR 5 DAYS   Report Status PENDING  Incomplete  Body fluid culture     Status: None (Preliminary result)   Collection Time: 10/24/18 12:19 PM  Result Value Ref Range Status   Specimen Description FLUID PLEURAL LEFT  Final   Special Requests PATIENT ON FOLLOWING CEFEPIME VANC ROCEPHIN  Final   Gram Stain   Final    FEW WBC PRESENT, PREDOMINANTLY PMN NO ORGANISMS SEEN    Culture   Final    NO GROWTH 3 DAYS Performed at [facility], [place] [address] [place], [place] [address]    Report Status PENDING  Incomplete  Fungus Culture With Stain     Status: None (Preliminary result)   Collection Time: 10/24/18 12:19 PM  Result Value Ref Range Status   Fungus Stain Final report  Final    Comment: (NOTE) Performed At: [facility] [place], [place] [id number] Rush Farmer MD [id number]    Fungus (Mycology) Culture PENDING  Incomplete   Fungal Source PLEURAL  Final    Comment: LEFT Performed at [facility], [place] [address] [place], [place] [address]   Acid Fast Smear (AFB)     Status: None   Collection Time: 10/24/18 12:19 PM  Result Value Ref Range Status   AFB Specimen Processing Concentration  Final   Acid Fast Smear  Negative  Final    Comment: (NOTE) Performed At: [facility] [place], [place] [id number] Rush Farmer MD [id number]    Source (AFB) PLEURAL  Final    Comment: LEFT Performed at [facility], [place] [address], [place], [place] [address]   Fungus Culture Result     Status: None   Collection Time: 10/24/18 12:19 PM  Result Value Ref Range Status   Result 1 Comment  Final    Comment: (NOTE) KOH/Calcofluor preparation:  no fungus observed. Performed At: [facility] [place], [place] [id number] Rush Farmer MD [id number]   Anaerobic culture     Status: None (Preliminary result)   Collection Time: 10/24/18 12:21 PM  Result Value Ref Range Status   Specimen Description FLUID PLEURAL LEFT 2  Final   Special Requests   Final    PATIENT ON FOLLOWING CEFEPIME VANC ROCEPHIN Performed at [facility], [place] [address] [place],  [address]    Culture   Final    NO ANAEROBES ISOLATED; CULTURE IN  PROGRESS FOR 5 DAYS   Report Status PENDING  Incomplete  Body fluid culture     Status: None (Preliminary result)   Collection Time: 10/24/18 12:21 PM  Result Value Ref Range Status   Specimen Description PLEURAL LEFT 2  Final   Special Requests PATIENT ON FOLLOWING CEFEPIME VANC ROCEPHIN  Final   Gram Stain NO WBC SEEN NO ORGANISMS SEEN   Final   Culture   Final    NO GROWTH 3 DAYS Performed at [facility], [place] [address], [place], [place] [address]    Report Status PENDING  Incomplete  Fungus Culture With Stain     Status: None (Preliminary result)   Collection Time: 10/24/18 12:21 PM  Result Value Ref Range Status   Fungus Stain Final report  Final    Comment: (NOTE) Performed At: [facility] [place], [place] [id number] Rush Farmer MD [id number]    Fungus (Mycology) Culture PENDING  Incomplete   Fungal Source PLEURAL  Final    Comment: LEFT 2 Performed at [facility], [place] [address], [place], [place] [address]   Acid Fast Smear (AFB)     Status: None   Collection Time: 10/24/18 12:21 PM  Result Value Ref Range Status   AFB Specimen Processing Concentration  Final   Acid Fast Smear Negative  Final    Comment: (NOTE) Performed At: [facility] [place], [place] [id number] Rush Farmer MD [id number]    Source (AFB) PLEURAL  Final    Comment: LEFT 2 Performed at [facility], [place] [address] [place], [place] [address]   Fungus Culture Result     Status: None   Collection Time: 10/24/18 12:21 PM  Result Value Ref Range Status   Result 1 Comment  Final    Comment: (NOTE) KOH/Calcofluor preparation:  no fungus observed. Performed At: [facility] [place], [place] [id number] Rush Farmer MD [id number]   Fungus Culture With Stain     Status: None (Preliminary result)   Collection Time: 10/24/18 12:23 PM  Result Value Ref Range Status   Fungus Stain Final report  Final    Comment: (NOTE) Performed At: [facility] [place], [place] [id number] Rush Farmer MD [id number]    Fungus (Mycology) Culture PENDING  Incomplete   Fungal Source TISSUE  Final    Comment: PLEURAL LEFT Performed at [facility], [place] [address] [place], [place] [address]   Aerobic/Anaerobic Culture (surgical/deep wound)     Status: None (Preliminary result)   Collection Time: 10/24/18 12:23 PM  Result Value Ref Range Status   Specimen Description TISSUE PLEURAL LEFT  Final   Special Requests PATIENT ON FOLLOWING CEFEPIME ROCEPHIN VANC  Final   Gram Stain   Final    RARE WBC PRESENT, PREDOMINANTLY PMN RARE GRAM NEGATIVE RODS Performed at [facility], [place] [address], [place],  [address]    Culture   Final    CULTURE REINCUBATED FOR BETTER GROWTH NO ANAEROBES ISOLATED; CULTURE IN PROGRESS FOR 5 DAYS    Report Status PENDING  Incomplete  Acid Fast Smear (AFB)      Status: None   Collection Time: 10/24/18 12:23 PM  Result Value Ref Range Status   AFB Specimen Processing Comment  Final    Comment: Tissue Grinding and Digestion/Decontamination   Acid Fast Smear Negative  Final    Comment: (NOTE) Performed At: [facility]  [place], [place] [id number] Rush Farmer MD [id number]    Source (AFB) TISSUE  Final    Comment: PLEURAL LEFT Performed at [facility], [place] [address], [place],  [address]   Fungus Culture Result     Status: None   Collection Time: 10/24/18 12:23 PM  Result Value Ref Range Status   Result 1 Comment  Final    Comment: (NOTE) KOH/Calcofluor preparation:  no fungus observed. Performed At: [facility] [place], [place] [id number] Rush Farmer MD [id number]          Radiology Studies: Dg Chest Port 1 View  Result Date: 10/27/2018 CLINICAL DATA:  Chest tube. EXAM: PORTABLE CHEST 1 VIEW COMPARISON:  One-view chest x-ray 10/26/2018 FINDINGS: The heart is enlarged. One of the left-sided chest tube was removed. Left pleural effusion remains. There is no pneumothorax. Right IJ line is stable. Left basilar airspace disease is unchanged. Mild atelectasis at the right base is slightly improved. IMPRESSION: 1. Interval removal of 1 left-sided chest tube without pneumothorax. 2. Stable left pleural effusion and basilar airspace disease. While this likely reflects atelectasis, infection is not excluded. 3. Slight improved atelectasis at the right base. Electronically Signed   By: San Morelle M.D.   On: 10/27/2018 09:27   Dg Chest Port 1 View  Result Date: 10/26/2018 CLINICAL DATA:  Chest tube.  Sore chest. EXAM: PORTABLE CHEST 1 VIEW COMPARISON:  10/25/2018. FINDINGS: Right IJ line and left chest tubes in stable position. No pneumothorax. Stable cardiomegaly. Unchanged bibasilar atelectasis. Unchanged left-sided pleural thickening. IMPRESSION: 1. Right IJ line  and left chest tubes in stable position. No pneumothorax. 2.  Stable bibasilar atelectasis and left-sided pleural effusion. 3.  Stable cardiomegaly. Electronically Signed   By: Marcello Moores  Register   On: 10/26/2018 08:51        Scheduled Meds: . acetaminophen  1,000 mg Oral Q6H   Or  . acetaminophen (TYLENOL) oral liquid 160 mg/5 mL  1,000 mg Oral Q6H  . bisacodyl  10 mg Oral Daily  . enoxaparin (LOVENOX) injection  40 mg Subcutaneous Daily  . insulin aspart  0-15 Units Subcutaneous TID WC  . ipratropium-albuterol  3 mL Nebulization TID  . metroNIDAZOLE  500 mg Oral TID  . senna-docusate  1 tablet Oral QHS   Continuous Infusions: . sodium chloride 250 mL (10/26/18 2157)  . ceFEPime (MAXIPIME) IV 2 g (10/27/18 1317)  . potassium chloride       LOS: 7 days    Time spent: 35 mins    Benito Mccreedy, MD [facility] Pager [phone]  If 7PM-7AM, please contact night-coverage [email or web address] Password [facility] 10/27/2018, 7:59 PM

## 2018-10-27 NOTE — Progress Notes (Addendum)
Chest tube and central line removed. Dressing with Vaseline gauze and minimal pressure applied. Pt tolerated well with out any bleeding or complications. Her breathing was shallow short when she ambulated to the rest room with room air SPO2 was 87%, with O2  LPM SPO2 was 99%. Encouraged to do deep breathing exercise with incentive spirometer, she did well with reinforcement up to 1200 ml, well tolerated when ambulated in the hallway 470 feet. Vital sings remains stable. Will continue to monitor.  Kennyth Lose, RN

## 2018-10-27 NOTE — Progress Notes (Addendum)
      [address]       [place] [address]             [phone]       3 Days Post-Op Procedure(s) (LRB): VIDEO ASSISTED THORACOSCOPY (VATS)/THOROCOTOMY with EVACUATION OF EMPYEMA AND DECORTICATION. (Left) EMPYEMA DRAINAGE (Left) VIDEO BRONCHOSCOPY (N/A)  Subjective: Patient with sore throat.  Objective: Vital signs in last 24 hours: Temp:  [98.3 F (36.8 C)-99.2 F (37.3 C)] 98.6 F (37 C) (11/02 0342) Pulse Rate:  [64-74] 69 (11/02 0342) Cardiac Rhythm: Normal sinus rhythm (11/01 2018) Resp:  [16-27] 26 (11/02 0342) BP: (87-135)/(48-84) 127/65 (11/02 0342) SpO2:  [91 %-100 %] 98 % (11/02 0342) Weight:  [153.2 kg] 153.2 kg (11/02 0342)     Intake/Output from previous day: 11/01 0701 - 11/02 0700 In: 1083.1 [P.O.:600; IV Piggyback:483.1] Out: 7616 [Urine:2610; Chest Tube:30]   Physical Exam:  Cardiovascular: RRR. Pulmonary: Clear to auscultation on the right and diminished left base Abdomen: Soft, obese, non tender, bowel sounds present. Extremities: Mild bilateral lower extremity edema. Wounds: Dressings clean and dry.   Chest Tube: to suction, no air leak  Lab Results: CBC: Recent Labs    10/26/18 0433 10/27/18 0535  WBC 15.8* 13.5*  HGB 8.7* 9.2*  HCT 29.8* 30.9*  PLT 454* 451*   BMET:  Recent Labs    10/25/18 0325 10/26/18 0433  NA 139 141  K 3.8 3.6  CL 107 109  CO2 26 28  GLUCOSE 187* 93  BUN 8 12  CREATININE 0.76 0.84  CALCIUM 8.3* 8.2*    PT/INR: No results for input(s): LABPROT, INR in the last 72 hours. ABG:  INR: Will add last result for INR, ABG once components are confirmed Will add last 4 CBG results once components are confirmed  Assessment/Plan:  1. CV - SR 2.  Pulmonary - Chest tube with 30 cc last 24 hours. Chest tube is to suction. There is no air leak. CXR this am appears stable (bibasilar atelectasis and left pleural consolidation) . As discussed with Dr. Cyndia Bent, will remove chest tube. Check CXR  in am. On 2 liters of oxygen via [place]. Encourage incentive spirometer. Cultures show no growth to date, AFB negative, and gram stain showed rare gram negative rods. 3. Throat lozenges PRN sore throat 4. ID-on Cefipime and Flagyl. 5. Anemia-H and H slightly increased this am 9.2 and 30.9 6. DM-CBGs 89/84/98. On Insulin PRN. Was on Metformin prior to admission. Per primary service  Donielle M ZimmermanPA-C 10/27/2018,7:31 AM [phone]   Chart reviewed, patient examined, agree with above. She feels ok. Chest tube is now out. She has not walked yet today and is still on oxygen. Needs to walk and use IS.

## 2018-10-27 NOTE — Progress Notes (Signed)
Patient states that she does not want to try the CPAP again tonight. She stated also that she hasn't had sleep study and she doesn't want to try experimental settings. Explained to patient we would place her on auto titration to give her what was needed based on her breathing. She still declined at this time.

## 2018-10-28 ENCOUNTER — Inpatient Hospital Stay (HOSPITAL_COMMUNITY): Payer: Self-pay

## 2018-10-28 LAB — BODY FLUID CULTURE
Culture: NO GROWTH
Culture: NO GROWTH
Gram Stain: NONE SEEN

## 2018-10-28 LAB — GLUCOSE, CAPILLARY
GLUCOSE-CAPILLARY: 123 mg/dL — AB (ref 70–99)
GLUCOSE-CAPILLARY: 119 mg/dL — AB (ref 70–99)
Glucose-Capillary: 126 mg/dL — ABNORMAL HIGH (ref 70–99)

## 2018-10-28 NOTE — Progress Notes (Signed)
Patient refuses CPAP 

## 2018-10-28 NOTE — Progress Notes (Addendum)
      [address]       [place] [address]             [phone]       4 Days Post-Op Procedure(s) (LRB): VIDEO ASSISTED THORACOSCOPY (VATS)/THOROCOTOMY with EVACUATION OF EMPYEMA AND DECORTICATION. (Left) EMPYEMA DRAINAGE (Left) VIDEO BRONCHOSCOPY (N/A)  Subjective: Patient with less pain now that chest tube has been removed.  Objective: Vital signs in last 24 hours: Temp:  [98.6 F (37 C)-98.8 F (37.1 C)] 98.8 F (37.1 C) (11/03 0452) Pulse Rate:  [64-85] 64 (11/03 0452) Cardiac Rhythm: Normal sinus rhythm (11/02 2030) Resp:  [15-29] 27 (11/03 0452) BP: (77-127)/(52-65) 127/55 (11/03 0452) SpO2:  [87 %-100 %] 96 % (11/03 0813)     Intake/Output from previous day: 11/02 0701 - 11/03 0700 In: 740 [P.O.:240; IV Piggyback:500] Out: 2720 [Urine:2700]   Physical Exam:  Cardiovascular: RRR. Pulmonary: Clear to auscultation on the right and diminished left base Abdomen: Soft, obese, non tender, bowel sounds present. Extremities: Mild bilateral lower extremity edema. Wounds: Dressings clean and dry.   Chest Tube: to suction, no air leak  Lab Results: CBC: Recent Labs    10/26/18 0433 10/27/18 0535  WBC 15.8* 13.5*  HGB 8.7* 9.2*  HCT 29.8* 30.9*  PLT 454* 451*   BMET:  Recent Labs    10/26/18 0433 10/27/18 0535  NA 141 142  K 3.6 4.0  CL 109 109  CO2 28 28  GLUCOSE 93 103*  BUN 12 7*  CREATININE 0.84 0.79  CALCIUM 8.2* 8.4*    PT/INR: No results for input(s): LABPROT, INR in the last 72 hours. ABG:  INR: Will add last result for INR, ABG once components are confirmed Will add last 4 CBG results once components are confirmed  Assessment/Plan:  1. CV - SR 2.  Pulmonary - CXR this am hard to evaluate as film is dark but appears stable (bibasilar atelectasis and left pleural consolidation) .  On 2 liters of oxygen via [place]. Wean as able. Encourage incentive spirometer and flutter valve. Cultures re incubated for better growth,  AFB negative, and gram stain showed rare gram negative rods. 3. ID-on Cefipime and Flagyl. 4. Anemia-H and H slightly increased this am 9.2 and 30.9 5. DM-CBGs 95/103/97. On Insulin PRN. Was on Metformin prior to admission. Per primary service  Sharalyn Ink [facility] 10/28/2018,8:26 AM [phone]    Chart reviewed, patient examined, agree with above. CXR looks stable with left pleural thickening and LLL atelectasis/infiltrate. Culture negative so far. Continue present antibiotic. Needs to do IS and ambulate.

## 2018-10-28 NOTE — Progress Notes (Signed)
PROGRESS NOTE  Taylor Leon  JHE:[id number] DOB: 1956/09/01  DOA: 10/19/2018 PCP: Tammi Sou, MD   Brief Narrative:  62 year old female with PMH significant for morbid obesity, adult onset asthma, type 2 diabetes who   Was admitted to the hospital on 10/19/2018 with comment acquired pneumonia and small parapneumonic effusion, with sepsis.    She was admitted to [facility] and started on Zithromax and ceftriaxone.  A left thoracentesis performed on 10/28 with 970 ml of yellow-green pleural fluid, WBC 690, 87 PMN, LDH 985, exudate effusion by Light's criteria.   Repeat CT chest showed progression of left effusion with loculation, with pneumonia in left upper and lower lobes, consistent with empyema.  Antibiotic coverage changed to vancomycin and cefepime on 10/29.  Blood cultures with no growth to date. She has remained hemodynamically stable with minimal 2-4 L [place] O2 requirement.  She was transferred to [facility] 10/30 for evaluation by TCTS.    Patient underwent a left VATS by Dr. Servando Snare 10/30.  She was unable to be extubated post operatively secondary to low tidal volumes and therefore returns to ICU on mechanical ventilation, and switched to PCCM service. She was successfully extubated on 10/25/2018.     Assessment & Plan:   Active Problems:   Asthmatic bronchitis   DJD (degenerative joint disease) of knee   CAP (community acquired pneumonia)   Hypoxemia   Precordial chest pain   Obesity, Class III, BMI 40-49.9 (morbid obesity) (HCC)   Type 2 diabetes mellitus with hyperlipidemia (HCC)   Empyema of pleural space (HCC)     Pneumonia/empyema History of adult-onset asthma In the setting of ? OSA/OHS, and Morbid Obesity  continue pulmonary toileting measures  mobilize  follow up on cultures, WBC and fever care  continue antibiotics  supportive care Both CT's are out  Sleep study for definitive diagnosis   normocytic anemia  Monitor and transfuse PRBC as needed  diabetes mellitus  type 2 Glycemic control with insulin   severe protein calorie malnutrition Nutritional support and optimization    DVT prophylaxis: Lovenox Code Status: Full Family Communication:  Disposition Plan: TBD   Consultants:   PCCM  cardiothoracic surgery  Procedures:  10/28 L thoracentesis  10/30 L VATS 10/30 ETT >> 10/25/2018 10/30  R radial Aline >> 10/25/2018 10/24/2018 chest tubes x2>>  Antimicrobials:  Maxipine, Flagyl   Subjective: Both Chest tubes out. Sitting comfortably in chair. No fever or chills  Objective:  Vitals:   10/27/18 2030 10/27/18 2120 10/28/18 0452 10/28/18 0813  BP: (!) 116/52  (!) 127/55   Pulse: 65  64   Resp: 15  (!) 27   Temp:   98.8 F (37.1 C)   TempSrc:   Oral   SpO2: 92% 95% 95% 96%  Weight:      Height:        Intake/Output Summary (Last 24 hours) at 10/28/2018 0949 Last data filed at 10/28/2018 0501 Gross per 24 hour  Intake 500 ml  Output 2720 ml  Net -2220 ml   Filed Weights   10/24/18 0939 10/25/18 0420 10/27/18 0342  Weight: (!) 159.8 kg (!) 158.8 kg (!) 153.2 kg    Examination:  General exam:  No acute distress, on oxygen by nasal cannula Respiratory system:  Diminished breath sounds. Respiratory effort normal. Cardiovascular system: S1 & S2 heard, RRR. No JVD, murmurs, rubs, gallops or clicks. No pedal edema. Gastrointestinal system: Abdomen is nondistended, soft and nontender. No organomegaly or masses felt. Normal bowel  sounds heard. Central nervous system: Alert and oriented. No focal neurological deficits. Extremities: Symmetric 5 x 5 power. Skin: No rashes, lesions or ulcers Psychiatry: Judgement and insight appear normal. Mood & affect appropriate.     Data Reviewed: I have personally reviewed following labs and imaging studies  CBC: Recent Labs  Lab 10/22/18 0447 10/23/18 0447 10/25/18 0325 10/26/18 0433 10/27/18 0535  WBC 21.5* 15.4* 18.1* 15.8* 13.5*  NEUTROABS 15.0* 11.9*  --   --   --   HGB  10.4* 10.1* 9.0* 8.7* 9.2*  HCT 35.6* 33.6* 30.6* 29.8* 30.9*  MCV 90.1 88.7 87.7 87.9 87.0  PLT 456* 410* 447* 454* 836*   Basic Metabolic Panel: Recent Labs  Lab 10/22/18 0447 10/23/18 0447 10/25/18 0325 10/26/18 0433 10/27/18 0535  NA 139 140 139 141 142  K 3.8 4.0 3.8 3.6 4.0  CL 106 107 107 109 109  CO2 24 24 26 28 28   GLUCOSE 131* 117* 187* 93 103*  BUN 12 8 8 12  7*  CREATININE 0.84 0.76 0.76 0.84 0.79  CALCIUM 8.5* 8.6* 8.3* 8.2* 8.4*  MG  --   --  2.4 2.3  --   PHOS  --   --  3.4 2.8  --    GFR: Estimated Creatinine Clearance: 113 mL/min (by C-G formula based on SCr of 0.79 mg/dL). Liver Function Tests: Recent Labs  Lab 10/25/18 0325 10/26/18 0433  AST  --  20  ALT  --  25  ALKPHOS  --  90  BILITOT  --  0.5  PROT  --  5.3*  ALBUMIN 1.6* 1.6*   No results for input(s): LIPASE, AMYLASE in the last 168 hours. No results for input(s): AMMONIA in the last 168 hours. Coagulation Profile: No results for input(s): INR, PROTIME in the last 168 hours. Cardiac Enzymes: No results for input(s): CKTOTAL, CKMB, CKMBINDEX, TROPONINI in the last 168 hours. BNP (last 3 results) No results for input(s): PROBNP in the last 8760 hours. HbA1C: No results for input(s): HGBA1C in the last 72 hours. CBG: Recent Labs  Lab 10/27/18 0608 10/27/18 1143 10/27/18 1637 10/27/18 2045 10/28/18 0900  GLUCAP 98 95 103* 97 126*   Lipid Profile: No results for input(s): CHOL, HDL, LDLCALC, TRIG, CHOLHDL, LDLDIRECT in the last 72 hours. Thyroid Function Tests: No results for input(s): TSH, T4TOTAL, FREET4, T3FREE, THYROIDAB in the last 72 hours. Anemia Panel: No results for input(s): VITAMINB12, FOLATE, FERRITIN, TIBC, IRON, RETICCTPCT in the last 72 hours.  Sepsis Labs: Recent Labs  Lab 10/23/18 0447 10/25/18 0325 10/26/18 0433 10/27/18 0535  WBC 15.4* 18.1* 15.8* 13.5*    Recent Results (from the past 240 hour(s))  Culture, blood (routine x 2)     Status: None    Collection Time: 10/20/18  2:30 PM  Result Value Ref Range Status   Specimen Description   Final    BLOOD RIGHT HAND Performed at [facility] [address] [place], [place] [address]    Special Requests   Final    AEB BCAV Performed at [facility] [address] [place], [place] [address]    Culture   Final    NO GROWTH 5 DAYS Performed at [facility], [place] [address] [place], [place] [address]    Report Status 10/25/2018 FINAL  Final  Culture, blood (routine x 2)     Status: None   Collection Time: 10/20/18  2:30 PM  Result Value Ref Range Status  Specimen Description   Final    BLOOD RIGHT HAND Performed at [facility] [address] [place], [place] [address]    Special Requests   Final    AEB BCAV Performed at [facility] [address], [place], [place] [address]    Culture   Final    NO GROWTH 5 DAYS Performed at [facility], [place] [address], [place], [place] [address]    Report Status 10/25/2018 FINAL  Final  Culture, body fluid-bottle     Status: None   Collection Time: 10/22/18  4:21 PM  Result Value Ref Range Status   Specimen Description FLUID PLEURAL LEFT  Final   Special Requests BOTTLES DRAWN AEROBIC AND ANAEROBIC  Final   Culture   Final    NO GROWTH 5 DAYS Performed at [facility], [place] [address] [place], [place] [address]    Report Status 10/27/2018 FINAL  Final  Gram stain     Status: None   Collection Time: 10/22/18  4:21 PM  Result Value Ref Range Status   Specimen Description FLUID PLEURAL LEFT  Final   Special Requests NONE  Final   Gram Stain   Final    WBC PRESENT,BOTH PMN AND MONONUCLEAR NO ORGANISMS SEEN CYTOSPIN SMEAR Performed at [facility], [address], [place], [place] [address]    Report Status 10/22/2018 FINAL  Final  MRSA PCR Screening     Status: None   Collection Time: 10/23/18  9:25 PM  Result Value Ref  Range Status   MRSA by PCR NEGATIVE NEGATIVE Final    Comment:        The GeneXpert MRSA Assay (FDA approved for NASAL specimens only), is one component of a comprehensive MRSA colonization surveillance program. It is not intended to diagnose MRSA infection nor to guide or monitor treatment for MRSA infections. Performed at [facility], [place] [address], [place], [place] [address]   Fungus Culture With Stain     Status: None   Collection Time: 10/24/18 11:26 AM  Result Value Ref Range Status   Fungus Stain Final report  Final    Comment: (NOTE) Performed At: [facility] [place], [place] [id number] Rush Farmer MD FU:[id number]    Fungus (Mycology) Culture PENDING  Incomplete   Fungal Source BRONCHIAL WASHINGS  Corrected    Comment: Performed at [facility], [place] [address], [place], [place] [address] CORRECTED ON 10/30 AT 1414: PREVIOUSLY REPORTED AS BRONCHIAL ALVEOLAR LAVAGE LEFT   Culture, respiratory     Status: None   Collection Time: 10/24/18 11:26 AM  Result Value Ref Range Status   Specimen Description BRONCHIAL WASHINGS  Final   Special Requests PATIENT ON FOLLOWING CEFEPIME VANC ROCEPHIN  Final   Gram Stain NO WBC SEEN NO ORGANISMS SEEN   Final   Culture   Final    NO GROWTH 2 DAYS Performed at [facility], [place] [address] [place], [place] [address]    Report Status 10/26/2018 FINAL  Final  Acid Fast Smear (AFB)     Status: None   Collection Time: 10/24/18 11:26 AM  Result Value Ref Range Status   AFB Specimen Processing Concentration  Final   Acid Fast Smear Negative  Final    Comment: (NOTE) Performed At: [facility] [place], [place] [id number] Rush Farmer MD [id number]    Source (AFB) BRONCHIAL WASHINGS  Corrected  Comment: Performed at [facility], [place] [address] [place], [place] [address] CORRECTED ON 10/30 AT 1414: PREVIOUSLY REPORTED AS BRONCHIAL ALVEOLAR LAVAGE  LEFT   Fungus Culture Result     Status: None   Collection Time: 10/24/18 11:26 AM  Result Value Ref Range Status   Result 1 Comment  Final    Comment: (NOTE) KOH/Calcofluor preparation:  no fungus observed. Performed At: [facility] [place], [place] [id number] Rush Farmer MD [id number]   Anaerobic culture     Status: None (Preliminary result)   Collection Time: 10/24/18 12:19 PM  Result Value Ref Range Status   Specimen Description FLUID PLEURAL LEFT  Final   Special Requests   Final    PATIENT ON FOLLOWING CEFEPIME VANC ROCEPHIN Performed at [facility], [place] [address], [place], [place] [address]    Culture   Final    NO ANAEROBES ISOLATED; CULTURE IN PROGRESS FOR 5 DAYS   Report Status PENDING  Incomplete  Body fluid culture     Status: None (Preliminary result)   Collection Time: 10/24/18 12:19 PM  Result Value Ref Range Status   Specimen Description FLUID PLEURAL LEFT  Final   Special Requests PATIENT ON FOLLOWING CEFEPIME VANC ROCEPHIN  Final   Gram Stain   Final    FEW WBC PRESENT, PREDOMINANTLY PMN NO ORGANISMS SEEN    Culture   Final    NO GROWTH 3 DAYS Performed at [facility], [place] [address], [place], [place] [address]    Report Status PENDING  Incomplete  Fungus Culture With Stain     Status: None (Preliminary result)   Collection Time: 10/24/18 12:19 PM  Result Value Ref Range Status   Fungus Stain Final report  Final    Comment: (NOTE) Performed At: [facility] [place], [place] [id number] Rush Farmer MD [id number]    Fungus (Mycology) Culture PENDING  Incomplete   Fungal Source PLEURAL  Final    Comment: LEFT Performed at [facility], [place] [address], [place], [place] [address]   Acid Fast Smear (AFB)     Status: None   Collection Time: 10/24/18 12:19 PM  Result Value Ref Range Status   AFB Specimen Processing Concentration  Final   Acid Fast Smear Negative  Final     Comment: (NOTE) Performed At: [facility] [place], [place] [id number] Rush Farmer MD [id number]    Source (AFB) PLEURAL  Final    Comment: LEFT Performed at [facility], [place] [address], [place], [place] [address]   Fungus Culture Result     Status: None   Collection Time: 10/24/18 12:19 PM  Result Value Ref Range Status   Result 1 Comment  Final    Comment: (NOTE) KOH/Calcofluor preparation:  no fungus observed. Performed At: [facility] [place], [place] [id number] Rush Farmer MD [id number]   Anaerobic culture     Status: None (Preliminary result)   Collection Time: 10/24/18 12:21 PM  Result Value Ref Range Status   Specimen Description FLUID PLEURAL LEFT 2  Final   Special Requests   Final    PATIENT ON FOLLOWING CEFEPIME VANC ROCEPHIN Performed at [facility], [place] [address] [place], [place] [address]    Culture   Final    NO ANAEROBES ISOLATED; CULTURE IN PROGRESS FOR 5 DAYS   Report Status PENDING  Incomplete  Body fluid culture  Status: None (Preliminary result)   Collection Time: 10/24/18 12:21 PM  Result Value Ref Range Status   Specimen Description PLEURAL LEFT 2  Final   Special Requests PATIENT ON FOLLOWING CEFEPIME VANC ROCEPHIN  Final   Gram Stain NO WBC SEEN NO ORGANISMS SEEN   Final   Culture   Final    NO GROWTH 3 DAYS Performed at [facility], [place] [address], [place], [place] [address]    Report Status PENDING  Incomplete  Fungus Culture With Stain     Status: None (Preliminary result)   Collection Time: 10/24/18 12:21 PM  Result Value Ref Range Status   Fungus Stain Final report  Final    Comment: (NOTE) Performed At: [facility] [place], [place] [id number] Rush Farmer MD [id number]    Fungus (Mycology) Culture PENDING  Incomplete   Fungal Source PLEURAL  Final    Comment: LEFT 2 Performed at [facility], [place] [address] [place], [place] [address]   Acid Fast Smear (AFB)     Status: None   Collection Time: 10/24/18 12:21 PM  Result Value Ref Range Status   AFB Specimen Processing Concentration  Final   Acid Fast Smear Negative  Final    Comment: (NOTE) Performed At: [facility] [place], [place] [id number] Rush Farmer MD [id number]    Source (AFB) PLEURAL  Final    Comment: LEFT 2 Performed at [facility], [place] [address], [place], [place] [address]   Fungus Culture Result     Status: None   Collection Time: 10/24/18 12:21 PM  Result Value Ref Range Status   Result 1 Comment  Final    Comment: (NOTE) KOH/Calcofluor preparation:  no fungus observed. Performed At: [facility] [place], [place] [id number] Rush Farmer MD [id number]   Fungus Culture With Stain     Status: None (Preliminary result)   Collection Time: 10/24/18 12:23 PM  Result Value Ref Range Status   Fungus Stain Final report  Final    Comment: (NOTE) Performed At: [facility] [place], [place] [id number] Rush Farmer MD [id number]    Fungus (Mycology) Culture PENDING  Incomplete   Fungal Source TISSUE  Final    Comment: PLEURAL LEFT Performed at [facility], [place] [address], [place], [place] [address]   Aerobic/Anaerobic Culture (surgical/deep wound)     Status: None (Preliminary result)   Collection Time: 10/24/18 12:23 PM  Result Value Ref Range Status   Specimen Description TISSUE PLEURAL LEFT  Final   Special Requests PATIENT ON FOLLOWING CEFEPIME ROCEPHIN VANC  Final   Gram Stain   Final    RARE WBC PRESENT, PREDOMINANTLY PMN RARE GRAM NEGATIVE RODS Performed at [facility], [place] [address] [place], [place] [address]    Culture   Final    CULTURE REINCUBATED FOR BETTER GROWTH NO ANAEROBES ISOLATED; CULTURE IN PROGRESS FOR 5 DAYS    Report Status PENDING  Incomplete  Acid Fast Smear (AFB)     Status: None    Collection Time: 10/24/18 12:23 PM  Result Value Ref Range Status   AFB Specimen Processing Comment  Final    Comment: Tissue Grinding and Digestion/Decontamination   Acid Fast Smear Negative  Final    Comment: (NOTE) Performed At: [facility] [address] [place], [place] [id number] Rush Farmer MD [id number]    Source (AFB) TISSUE  Final  Comment: PLEURAL LEFT Performed at [facility], [place] [address], [place], [place] [address]   Fungus Culture Result     Status: None   Collection Time: 10/24/18 12:23 PM  Result Value Ref Range Status   Result 1 Comment  Final    Comment: (NOTE) KOH/Calcofluor preparation:  no fungus observed. Performed At: [facility] [place], [place] [id number] Rush Farmer MD [id number]          Radiology Studies: Dg Chest 2 View  Result Date: 10/28/2018 CLINICAL DATA:  Pleural effusion EXAM: CHEST - 2 VIEW COMPARISON:  Radiograph 10/27/2018 FINDINGS: Stable enlarged cardiac silhouette. Interval removal of LEFT chest tube. No pneumothorax appreciated. Dense LEFT basilar atelectasis remains. Effusion seen along the lateral LEFT chest wall. RIGHT lung clear. IMPRESSION: Rule LEFT chest tube without pneumothorax appreciated Dense LEFT basilar atelectasis and effusion. Electronically Signed   By: Suzy Bouchard M.D.   On: 10/28/2018 08:25   Dg Chest Port 1 View  Result Date: 10/27/2018 CLINICAL DATA:  Chest tube. EXAM: PORTABLE CHEST 1 VIEW COMPARISON:  One-view chest x-ray 10/26/2018 FINDINGS: The heart is enlarged. One of the left-sided chest tube was removed. Left pleural effusion remains. There is no pneumothorax. Right IJ line is stable. Left basilar airspace disease is unchanged. Mild atelectasis at the right base is slightly improved. IMPRESSION: 1. Interval removal of 1 left-sided chest tube without pneumothorax. 2. Stable left pleural effusion and basilar airspace disease. While this likely  reflects atelectasis, infection is not excluded. 3. Slight improved atelectasis at the right base. Electronically Signed   By: San Morelle M.D.   On: 10/27/2018 09:27        Scheduled Meds: . acetaminophen  1,000 mg Oral Q6H   Or  . acetaminophen (TYLENOL) oral liquid 160 mg/5 mL  1,000 mg Oral Q6H  . bisacodyl  10 mg Oral Daily  . enoxaparin (LOVENOX) injection  40 mg Subcutaneous Daily  . insulin aspart  0-15 Units Subcutaneous TID WC  . ipratropium-albuterol  3 mL Nebulization TID  . metroNIDAZOLE  500 mg Oral TID  . senna-docusate  1 tablet Oral QHS   Continuous Infusions: . sodium chloride 250 mL (10/26/18 2157)  . ceFEPime (MAXIPIME) IV 2 g (10/28/18 0506)  . potassium chloride       LOS: 8 days    Time spent: 25 mins    Benito Mccreedy, MD [facility] Pager [phone]  If 7PM-7AM, please contact night-coverage [email or web address] Password [facility] 10/28/2018, 9:49 AM

## 2018-10-29 LAB — CBC
HCT: 36.3 % (ref 36.0–46.0)
Hemoglobin: 10.7 g/dL — ABNORMAL LOW (ref 12.0–15.0)
MCH: 25.5 pg — ABNORMAL LOW (ref 26.0–34.0)
MCHC: 29.5 g/dL — AB (ref 30.0–36.0)
MCV: 86.6 fL (ref 80.0–100.0)
NRBC: 0 % (ref 0.0–0.2)
Platelets: 530 10*3/uL — ABNORMAL HIGH (ref 150–400)
RBC: 4.19 MIL/uL (ref 3.87–5.11)
RDW: 15.6 % — AB (ref 11.5–15.5)
WBC: 16.1 10*3/uL — ABNORMAL HIGH (ref 4.0–10.5)

## 2018-10-29 LAB — GLUCOSE, CAPILLARY
Glucose-Capillary: 98 mg/dL (ref 70–99)
Glucose-Capillary: 89 mg/dL (ref 70–99)
Glucose-Capillary: 101 mg/dL — ABNORMAL HIGH (ref 70–99)
Glucose-Capillary: 99 mg/dL (ref 70–99)
Glucose-Capillary: 90 mg/dL (ref 70–99)
Glucose-Capillary: 106 mg/dL — ABNORMAL HIGH (ref 70–99)

## 2018-10-29 LAB — AEROBIC/ANAEROBIC CULTURE W GRAM STAIN (SURGICAL/DEEP WOUND)

## 2018-10-29 LAB — ANAEROBIC CULTURE

## 2018-10-29 MED ORDER — CEFAZOLIN SODIUM-DEXTROSE 2-4 GM/100ML-% IV SOLN
2.0000 g | Freq: Three times a day (TID) | INTRAVENOUS | Status: DC
  Administered 2018-10-29 – 2018-10-31 (×5): 2 g via INTRAVENOUS
  Filled 2018-10-29 (×6): qty 100

## 2018-10-29 MED ORDER — ENOXAPARIN SODIUM 80 MG/0.8ML ~~LOC~~ SOLN
0.5000 mg/kg | Freq: Every day | SUBCUTANEOUS | Status: DC
  Administered 2018-10-30 – 2018-11-01 (×3): 75 mg via SUBCUTANEOUS
  Filled 2018-10-29 (×3): qty 0.8

## 2018-10-29 NOTE — Progress Notes (Signed)
Pharmacy Antibiotic Note  Taylor Leon is a 62 y.o. female with PMH asthma, DM2, morbid obesity admitted on 10/19/2018 with sepsis d/t CAP. Patient initially started on Rocephin and Zithromax but changed to vancomycin and cefepime on 10/29 following CT shows persistent loculated collections despite removal of 970 ml during thoracentesis. Vancomycin d/c'd 11/1, currently continuing on cefepime (day #7) and Flagyl (day #4).  Underwent VAT procedure with evacuation of empyema on 10/30. WBC today increased to 16.1, remains afebrile. Scr stable. L pleural tissue cx grew out strep intermedius with sensitivities below.     It's sens to everything except erythromycin. D/w with Dr. Thereasa Solo today to de-escalate therapy and keep on IV for now until she is ready to transition to PO to complete 4 wks of abx. She doesn't have a true allergy to PCN.    Plan: Dc cefepime/flagyl Start ancef 2g IV q8 PO amoxillin when stable  Height: 5\' 7"  (170.2 cm) Weight: (!) 337 lb 11.2 oz (153.2 kg) IBW/kg (Calculated) : 61.6  Temp (24hrs), Avg:98.8 F (37.1 C), Min:98.4 F (36.9 C), Max:99.1 F (37.3 C)  Recent Labs  Lab 10/23/18 0447 10/25/18 0325 10/25/18 1658 10/26/18 0433 10/27/18 0535 10/29/18 0253  WBC 15.4* 18.1*  --  15.8* 13.5* 16.1*  CREATININE 0.76 0.76  --  0.84 0.79  --   VANCOTROUGH  --   --  11*  --   --   --     Estimated Creatinine Clearance: 113 mL/min (by C-G formula based on SCr of 0.79 mg/dL).    Allergies  Allergen Reactions  . Penicillins Nausea Only and Rash    Has patient had a PCN reaction causing immediate rash, facial/tongue/throat swelling, SOB or lightheadedness with hypotension: yES Has patient had a PCN reaction causing severe rash involving mucus membranes or skin necrosis: No Has patient had a PCN reaction that required hospitalization: No Has patient had a PCN reaction occurring within the last 10 years: No If all of the above answers are "NO", then may proceed with  Cephalosporin use. CC    Antimicrobials this admission:  Azithro 10/21>>10/28 Diflucan 150 mg x1 CTX 10/25>> 10/29 Vanc 10/29>>11/1 Cefepime 10/29>>11/4 Metronidazole 11/1>>11/4 Ancef 11/4>>  Dose adjustments this admission:  10/31 VT 11 - increase to 1500 mg IV q12  Microbiology results:  10/26 BCx x2: neg 10/28 L pleural fluid: neg 10/30 Bronch: no orgs 10/30 L pleural tissue: rare strep intermedius  Onnie Boer, PharmD, BCIDP, AAHIVP, CPP Infectious Disease Pharmacist 10/29/2018 3:25 PM

## 2018-10-29 NOTE — Progress Notes (Signed)
Pt refuses CPAP at this time. RT will continue to monitor.  

## 2018-10-29 NOTE — Progress Notes (Signed)
Steri strips and gauze applied to left wound on back per Ellwood Handler, PA's instructions. No drainage noted. Will continue to monitor

## 2018-10-29 NOTE — Progress Notes (Addendum)
      [address]       [place] [address]             [phone]      5 Days Post-Op Procedure(s) (LRB): VIDEO ASSISTED THORACOSCOPY (VATS)/THOROCOTOMY with EVACUATION OF EMPYEMA AND DECORTICATION. (Left) EMPYEMA DRAINAGE (Left) VIDEO BRONCHOSCOPY (N/A)  Subjective:  Patient states she feels like she is breathing better overall.  She continues to have some discomfort.  + BM  Objective: Vital signs in last 24 hours: Temp:  [98.4 F (36.9 C)-99 F (37.2 C)] 98.4 F (36.9 C) (11/04 0500) Pulse Rate:  [65-79] 65 (11/04 0500) Cardiac Rhythm: Normal sinus rhythm (11/03 1946) Resp:  [18-23] 22 (11/04 0500) BP: (123-124)/(56-77) 124/56 (11/04 0500) SpO2:  [95 %-100 %] 96 % (11/04 0743)  Intake/Output from previous day: 11/03 0701 - 11/04 0700 In: 940 [P.O.:740; IV Piggyback:200] Out: 2400 [Urine:2400]  General appearance: alert, cooperative and no distress Heart: regular rate and rhythm Lungs: diminished breath sounds bibasilar Abdomen: soft, non-tender; bowel sounds normal; no masses,  no organomegaly Extremities: extremities normal, atraumatic, no cyanosis or edema Wound: mild dehiscence of skin edge on mini thoracotomy, no drainage present, minimal erythema  Lab Results: Recent Labs    10/27/18 0535 10/29/18 0253  WBC 13.5* 16.1*  HGB 9.2* 10.7*  HCT 30.9* 36.3  PLT 451* 530*   BMET:  Recent Labs    10/27/18 0535  NA 142  K 4.0  CL 109  CO2 28  GLUCOSE 103*  BUN 7*  CREATININE 0.79  CALCIUM 8.4*    PT/INR: No results for input(s): LABPROT, INR in the last 72 hours. ABG    Component Value Date/Time   PHART 7.407 10/25/2018 0340   HCO3 26.5 10/25/2018 0340   TCO2 27 10/24/2018 1615   O2SAT 95.9 10/25/2018 0340   CBG (last 3)  Recent Labs    10/28/18 0900 10/28/18 1108 10/28/18 1551  GLUCAP 126* 123* 119*    Assessment/Plan: S/P Procedure(s) (LRB): VIDEO ASSISTED THORACOSCOPY (VATS)/THOROCOTOMY with EVACUATION OF EMPYEMA  AND DECORTICATION. (Left) EMPYEMA DRAINAGE (Left) VIDEO BRONCHOSCOPY (N/A)  1. CV- hemodynamically stable 2. Pulm- wean oxygen as tolerated, CXR yesterday with left base scarring, effusion 3. ID- emypema, OR cultures showing strep intermedius, sensitivities, pending, ABX per primary 4. Mild Wound Dehiscence- mostly skin edges, no evidence of drainage, likely due to skin breakdown from being moist due to sweat, nursing given wound instructions, will be imperative for healing that wound stays dry 5. Dispo- patient stable, care per primary   LOS: 9 days    Erin Barrett 10/29/2018  I have seen and examined Taylor Leon and agree with the above assessment  and plan.  Grace Isaac MD Beeper [phone] Office [phone] 10/29/2018 2:07 PM

## 2018-10-29 NOTE — Progress Notes (Addendum)
[place] for Lovenox Indication: VTE prophylaxis  Labs: Recent Labs    10/27/18 0535 10/29/18 0253  HGB 9.2* 10.7*  HCT 30.9* 36.3  PLT 451* 530*  CREATININE 0.79  --     Assessment: 44 yof presenting with sepsis due to PNA, complicated by empyema transferred to [facility] 10/30 for L VATS. Pharmacy consulted to dose Lovenox for VTE prophylaxis, currently on Lovenox 40mg  [place] q24h - last dose 11/4 at 0952. SCr stable wnl, CBC stable. No bleeding issues documented.  Spoke with Dr. Thereasa Solo - patient ok for obesity Lovenox dosing.  Goal of Therapy:  VTE prevention Monitor platelets by anticoagulation protocol: Yes   Plan:  Increase Lovenox to 75mg  (~0.5mg /kg) [place] q24h for BMI>30 Monitor CBC, SCr, weight changes, s/sx bleeding  Elicia Lamp, PharmD, BCPS Clinical Pharmacist 10/29/2018 11:14 AM

## 2018-10-29 NOTE — Progress Notes (Signed)
[place] TEAM 1 - Stepdown/ICU TEAM  Taylor Leon  NFA:[id number] DOB: Mar 03, 1956 DOA: 10/19/2018 PCP: Tammi Sou, MD    Brief Narrative:  62yo F w/ a hx of morbid obesity, asthma, and DM2 who as admitted 10/19/2018 with CAP and a small parapneumonic effusion.   A left thoracentesis on 10/28 noted WBC 690, LDH 985. Repeat CT chest showed progression of left effusion with loculation, with pneumonia in left upper and lower lobes, consistent with empyema.  She was transferred to [facility] 10/30 and underwent a L VATS by Dr. Servando Snare 10/30. She was unable to be extubated immediately post operatively secondary to low tidal volumes and therefore transitioned to the ICU. She was successfully extubated on 10/25/2018.  Subjective: Resting comfortably in bed. C/o ongoing coughing. Denies severe CP, N/V, or abdom pain.   Assessment & Plan:  Sepsis due to L Pneumonia complicated by empyema S/p VATS 10/30 - cont abx tx - post-op care per TCTS - plan for 4 weeks total of abx tx as per PCCM recs - no definitive culture data   Asthma Quiescent   Normocytic anemia  Hgb stable   DM2 CBG controlled at this time   Morbid obesity - Body mass index is 52.89 kg/m.  Severe protein calorie malnutrition Nutrition consult   Suspected OSA  To f/u w/ PCCM as outpt for formal sleep study - has been refusing CPAP in hospital   DVT prophylaxis: lovenox  Code Status: FULL CODE Family Communication: no family present at time of exam  Disposition Plan: cont PT/OT  Consultants:  PCCM TCTS  Antimicrobials:  10/25 azithro > 10/28 10/25 ceftriaxone > 10/29 10/29 cefepime > 11/1 flagyl >  10/29 vancomycin > 10/31  Objective: Blood pressure 132/63, pulse 79, temperature 98.8 F (37.1 C), temperature source Oral, resp. rate (!) 25, height 5\' 7"  (1.702 m), weight (!) 153.2 kg, SpO2 94 %.  Intake/Output Summary (Last 24 hours) at 10/29/2018 1020 Last data filed at 10/29/2018 0501 Gross per 24 hour    Intake 700 ml  Output 2400 ml  Net -1700 ml   Filed Weights   10/24/18 0939 10/25/18 0420 10/27/18 0342  Weight: (!) 159.8 kg (!) 158.8 kg (!) 153.2 kg   Examination: General: No acute respiratory distress at rest in bed  Lungs: poor air movement in L base - no wheezing - good air movement th/o otherwise Cardiovascular: RRR - no M or rub  Abdomen: NT, obese, soft, bowel sounds positive, no rebound, no ascites, no appreciable mass Extremities: trace B LE edema - no cyanosis   CBC: Recent Labs  Lab 10/23/18 0447  10/26/18 0433 10/27/18 0535 10/29/18 0253  WBC 15.4*   < > 15.8* 13.5* 16.1*  NEUTROABS 11.9*  --   --   --   --   HGB 10.1*   < > 8.7* 9.2* 10.7*  HCT 33.6*   < > 29.8* 30.9* 36.3  MCV 88.7   < > 87.9 87.0 86.6  PLT 410*   < > 454* 451* 530*   < > = values in this interval not displayed.   Basic Metabolic Panel: Recent Labs  Lab 10/25/18 0325 10/26/18 0433 10/27/18 0535  NA 139 141 142  K 3.8 3.6 4.0  CL 107 109 109  CO2 26 28 28   GLUCOSE 187* 93 103*  BUN 8 12 7*  CREATININE 0.76 0.84 0.79  CALCIUM 8.3* 8.2* 8.4*  MG 2.4 2.3  --   PHOS 3.4 2.8  --  GFR: Estimated Creatinine Clearance: 113 mL/min (by C-G formula based on SCr of 0.79 mg/dL).  Liver Function Tests: Recent Labs  Lab 10/25/18 0325 10/26/18 0433  AST  --  20  ALT  --  25  ALKPHOS  --  90  BILITOT  --  0.5  PROT  --  5.3*  ALBUMIN 1.6* 1.6*    HbA1C: Hemoglobin A1C  Date/Time Value Ref Range Status  10/10/2018 08:24 AM 5.6 4.0 - 5.6 % Final  03/14/2018 10:13 AM 5.6  Final   Hgb A1c MFr Bld  Date/Time Value Ref Range Status  06/20/2017 10:30 AM 6.2 4.6 - 6.5 % Final    Comment:    Glycemic Control Guidelines for People with Diabetes:Non Diabetic:  <6%Goal of Therapy: <7%Additional Action Suggested:  >8%   06/22/2016 08:42 AM 6.0 4.6 - 6.5 % Final    Comment:    Glycemic Control Guidelines for People with Diabetes:Non Diabetic:  <6%Goal of Therapy: <7%Additional Action  Suggested:  >8%     CBG: Recent Labs  Lab 10/27/18 1637 10/27/18 2045 10/28/18 0900 10/28/18 1108 10/28/18 1551  GLUCAP 103* 97 126* 123* 119*    Recent Results (from the past 240 hour(s))  Culture, blood (routine x 2)     Status: None   Collection Time: 10/20/18  2:30 PM  Result Value Ref Range Status   Specimen Description   Final    BLOOD RIGHT HAND Performed at [facility] [address], [place], [place] [address]    Special Requests   Final    AEB BCAV Performed at [facility] [address], [place], [place] [address]    Culture   Final    NO GROWTH 5 DAYS Performed at [facility], [place] [address] [place], [place] [address]    Report Status 10/25/2018 FINAL  Final  Culture, blood (routine x 2)     Status: None   Collection Time: 10/20/18  2:30 PM  Result Value Ref Range Status   Specimen Description   Final    BLOOD RIGHT HAND Performed at [facility] [address], [place], [place] [address]    Special Requests   Final    AEB BCAV Performed at [facility] [address], [place], [place] [address]    Culture   Final    NO GROWTH 5 DAYS Performed at [facility], [place] [address], [place], [place] [address]    Report Status 10/25/2018 FINAL  Final  Culture, body fluid-bottle     Status: None   Collection Time: 10/22/18  4:21 PM  Result Value Ref Range Status   Specimen Description FLUID PLEURAL LEFT  Final   Special Requests BOTTLES DRAWN AEROBIC AND ANAEROBIC  Final   Culture   Final    NO GROWTH 5 DAYS Performed at [facility], [place] [address], [place], [place] [address]    Report Status 10/27/2018 FINAL  Final  Gram stain     Status: None   Collection Time: 10/22/18  4:21 PM  Result Value Ref Range Status   Specimen Description FLUID PLEURAL LEFT  Final   Special Requests NONE  Final   Gram Stain   Final    WBC PRESENT,BOTH PMN AND  MONONUCLEAR NO ORGANISMS SEEN CYTOSPIN SMEAR Performed at [facility], [address], [place], [place] [address]    Report Status 10/22/2018 FINAL  Final  MRSA PCR  Screening     Status: None   Collection Time: 10/23/18  9:25 PM  Result Value Ref Range Status   MRSA by PCR NEGATIVE NEGATIVE Final    Comment:        The GeneXpert MRSA Assay (FDA approved for NASAL specimens only), is one component of a comprehensive MRSA colonization surveillance program. It is not intended to diagnose MRSA infection nor to guide or monitor treatment for MRSA infections. Performed at [facility], [place] [address], [place], [place] [address]   Fungus Culture With Stain     Status: None   Collection Time: 10/24/18 11:26 AM  Result Value Ref Range Status   Fungus Stain Final report  Final    Comment: (NOTE) Performed At: [facility] [place], [place] [id number] Rush Farmer MD XB:[id number]    Fungus (Mycology) Culture PENDING  Incomplete   Fungal Source BRONCHIAL WASHINGS  Corrected    Comment: Performed at [facility], [place] [address] [place], [place] [address] CORRECTED ON 10/30 AT 1414: PREVIOUSLY REPORTED AS BRONCHIAL ALVEOLAR LAVAGE LEFT   Culture, respiratory     Status: None   Collection Time: 10/24/18 11:26 AM  Result Value Ref Range Status   Specimen Description BRONCHIAL WASHINGS  Final   Special Requests PATIENT ON FOLLOWING CEFEPIME VANC ROCEPHIN  Final   Gram Stain NO WBC SEEN NO ORGANISMS SEEN   Final   Culture   Final    NO GROWTH 2 DAYS Performed at [facility], [place] [address], [place], [place] [address]    Report Status 10/26/2018 FINAL  Final  Acid Fast Smear (AFB)     Status: None   Collection Time: 10/24/18 11:26 AM  Result Value Ref Range Status   AFB Specimen Processing Concentration  Final   Acid Fast Smear Negative  Final    Comment: (NOTE) Performed At: [facility] [place], [place]  [id number] Rush Farmer MD [id number]    Source (AFB) BRONCHIAL WASHINGS  Corrected    Comment: Performed at [facility], [place] [address], [place], [place] [address] CORRECTED ON 10/30 AT 1414: PREVIOUSLY REPORTED AS BRONCHIAL ALVEOLAR LAVAGE LEFT   Fungus Culture Result     Status: None   Collection Time: 10/24/18 11:26 AM  Result Value Ref Range Status   Result 1 Comment  Final    Comment: (NOTE) KOH/Calcofluor preparation:  no fungus observed. Performed At: [facility] [place], [place] [id number] Rush Farmer MD [id number]   Anaerobic culture     Status: None (Preliminary result)   Collection Time: 10/24/18 12:19 PM  Result Value Ref Range Status   Specimen Description FLUID PLEURAL LEFT  Final   Special Requests   Final    PATIENT ON FOLLOWING CEFEPIME VANC ROCEPHIN Performed at [facility], [place] [address] [place],  [address]    Culture   Final    NO ANAEROBES ISOLATED; CULTURE IN PROGRESS FOR 5 DAYS   Report Status PENDING  Incomplete  Body fluid culture     Status: None   Collection Time: 10/24/18 12:19 PM  Result Value Ref Range Status   Specimen Description FLUID PLEURAL LEFT  Final   Special Requests PATIENT ON FOLLOWING CEFEPIME VANC ROCEPHIN  Final   Gram Stain   Final    FEW WBC PRESENT, PREDOMINANTLY PMN NO ORGANISMS SEEN    Culture   Final    NO GROWTH 3  DAYS Performed at [facility], [place] [address], [place], [place] [address]    Report Status 10/28/2018 FINAL  Final  Fungus Culture With Stain     Status: None (Preliminary result)   Collection Time: 10/24/18 12:19 PM  Result Value Ref Range Status   Fungus Stain Final report  Final    Comment: (NOTE) Performed At: [facility] [place], [place] [id number] Rush Farmer MD [id number]    Fungus (Mycology) Culture PENDING  Incomplete   Fungal Source PLEURAL  Final    Comment: LEFT Performed at [facility], [place] [address] [place], [place] [address]   Acid Fast Smear (AFB)     Status: None   Collection Time: 10/24/18 12:19 PM  Result Value Ref Range Status   AFB Specimen Processing Concentration  Final   Acid Fast Smear Negative  Final    Comment: (NOTE) Performed At: [facility] [place], [place] [id number] Rush Farmer MD [id number]    Source (AFB) PLEURAL  Final    Comment: LEFT Performed at [facility], [place] [place] [address], [place], [place] [address]   Fungus Culture Result     Status: None   Collection Time: 10/24/18 12:19 PM  Result Value Ref Range Status   Result 1 Comment  Final    Comment: (NOTE) KOH/Calcofluor preparation:  no fungus observed. Performed At: [facility] [place], [place] [id number] Rush Farmer MD [id number]   Anaerobic culture     Status: None (Preliminary result)   Collection Time: 10/24/18 12:21 PM  Result Value Ref Range Status   Specimen Description FLUID PLEURAL LEFT 2  Final   Special Requests   Final    PATIENT ON FOLLOWING CEFEPIME VANC ROCEPHIN Performed at [facility], [place] [address], [place], [place] [address]    Culture   Final    NO ANAEROBES ISOLATED; CULTURE IN PROGRESS FOR 5 DAYS   Report Status PENDING  Incomplete  Body fluid culture     Status: None   Collection Time: 10/24/18 12:21 PM  Result Value Ref Range Status   Specimen Description PLEURAL LEFT 2  Final   Special Requests PATIENT ON FOLLOWING CEFEPIME VANC ROCEPHIN  Final   Gram Stain NO WBC SEEN NO ORGANISMS SEEN   Final   Culture   Final    NO GROWTH 3 DAYS Performed at [facility], [place] [address], [place], [place] [address]    Report Status 10/28/2018 FINAL  Final  Fungus Culture With Stain     Status: None (Preliminary result)   Collection Time: 10/24/18 12:21 PM  Result Value Ref Range Status   Fungus Stain Final report  Final    Comment: (NOTE) Performed At: [facility] [place], [place] [id number] Rush Farmer MD [id number]    Fungus (Mycology) Culture PENDING  Incomplete   Fungal Source PLEURAL  Final    Comment: LEFT 2 Performed at [facility], [place] [address], [place], [place] [address]   Acid Fast Smear (AFB)     Status: None   Collection Time: 10/24/18 12:21 PM  Result Value Ref Range Status   AFB Specimen Processing Concentration  Final   Acid Fast Smear Negative  Final    Comment: (NOTE) Performed At: [facility] [place], [place] [id number] Rush Farmer MD [id number]    Source (AFB) PLEURAL  Final  Comment: LEFT 2 Performed at [facility], [place] [address] [place], [place] [address]   Fungus Culture Result     Status: None   Collection Time: 10/24/18 12:21 PM  Result Value Ref Range Status   Result 1 Comment  Final    Comment: (NOTE) KOH/Calcofluor preparation:  no fungus observed. Performed At: [facility] [place], [place] [id number] Rush Farmer MD [id number]   Fungus Culture With Stain     Status: None (Preliminary result)   Collection Time: 10/24/18 12:23 PM  Result Value Ref Range Status   Fungus Stain Final report  Final    Comment: (NOTE) Performed At: [facility] [place], [place] [id number] Rush Farmer MD [id number]    Fungus (Mycology) Culture PENDING  Incomplete   Fungal Source TISSUE  Final    Comment: PLEURAL LEFT Performed at [facility], [place] [address], [place], [place] [address]   Aerobic/Anaerobic Culture (surgical/deep wound)     Status: None (Preliminary result)   Collection Time: 10/24/18 12:23 PM  Result Value Ref Range Status   Specimen Description TISSUE PLEURAL LEFT  Final   Special Requests PATIENT ON FOLLOWING CEFEPIME ROCEPHIN VANC  Final   Gram Stain   Final    RARE WBC PRESENT, PREDOMINANTLY PMN RARE GRAM NEGATIVE RODS Performed at [facility], [place] [address] [place], [place] [address]    Culture   Final    RARE STREPTOCOCCUS INTERMEDIUS SUSCEPTIBILITIES TO FOLLOW NO ANAEROBES ISOLATED; CULTURE IN PROGRESS FOR 5 DAYS    Report Status PENDING  Incomplete  Acid Fast Smear (AFB)     Status: None   Collection Time: 10/24/18 12:23 PM  Result Value Ref Range Status   AFB Specimen Processing Comment  Final    Comment: Tissue Grinding and Digestion/Decontamination   Acid Fast Smear Negative  Final    Comment: (NOTE) Performed At: [facility] [place], [place] [id number] Rush Farmer MD [id number]    Source (AFB) TISSUE  Final    Comment: PLEURAL LEFT Performed at [facility], [place] [address] [place], [place] [address]   Fungus Culture Result     Status: None   Collection Time: 10/24/18 12:23 PM  Result Value Ref Range Status   Result 1 Comment  Final    Comment: (NOTE) KOH/Calcofluor preparation:  no fungus observed. Performed At: [facility] [place], [place] [id number] Rush Farmer MD [id number]      Scheduled Meds: . acetaminophen  1,000 mg Oral Q6H   Or  . acetaminophen (TYLENOL) oral liquid 160 mg/5 mL  1,000 mg Oral Q6H  . bisacodyl  10 mg Oral Daily  . enoxaparin (LOVENOX) injection  40 mg Subcutaneous Daily  . insulin aspart  0-15 Units Subcutaneous TID WC  . ipratropium-albuterol  3 mL Nebulization TID  . metroNIDAZOLE  500 mg Oral TID  . senna-docusate  1 tablet Oral QHS   Continuous Infusions: . sodium chloride 250 mL (10/26/18 2157)  . ceFEPime (MAXIPIME) IV 2 g (10/29/18 0532)  . potassium chloride       LOS: 9 days   Cherene Altes, MD [facility] Office  [phone] Pager - Text Page per Amion  If 7PM-7AM, please contact night-coverage per Amion 10/29/2018, 10:20 AM

## 2018-10-29 NOTE — Progress Notes (Signed)
Pharmacy Antibiotic Note  Taylor Leon is a 62 y.o. female with PMH asthma, DM2, morbid obesity admitted on 10/19/2018 with sepsis d/t CAP. Patient initially started on Rocephin and Zithromax but changed to vancomycin and cefepime on 10/29 following CT shows persistent loculated collections despite removal of 970 ml during thoracentesis. Vancomycin d/c'd 11/1, currently continuing on cefepime (day #7) and Flagyl (day #4).  Underwent VAT procedure with evacuation of empyema on 10/30. WBC today increased to 16.1, remains afebrile. Scr stable. L pleural tissue cx showing rare GNR, strep intermedius.   Plan: Continue cefepime 2g IV q8h Flagyl 500mg  PO Q8h per MD Monitor clinical progress, c/s, renal function F/u de-escalation plan/LOT   Height: 5\' 7"  (170.2 cm) Weight: (!) 337 lb 11.2 oz (153.2 kg) IBW/kg (Calculated) : 61.6  Temp (24hrs), Avg:98.7 F (37.1 C), Min:98.4 F (36.9 C), Max:99 F (37.2 C)  Recent Labs  Lab 10/23/18 0447 10/25/18 0325 10/25/18 1658 10/26/18 0433 10/27/18 0535 10/29/18 0253  WBC 15.4* 18.1*  --  15.8* 13.5* 16.1*  CREATININE 0.76 0.76  --  0.84 0.79  --   VANCOTROUGH  --   --  11*  --   --   --     Estimated Creatinine Clearance: 113 mL/min (by C-G formula based on SCr of 0.79 mg/dL).    Allergies  Allergen Reactions  . Penicillins Nausea Only and Rash    Has patient had a PCN reaction causing immediate rash, facial/tongue/throat swelling, SOB or lightheadedness with hypotension: yES Has patient had a PCN reaction causing severe rash involving mucus membranes or skin necrosis: No Has patient had a PCN reaction that required hospitalization: No Has patient had a PCN reaction occurring within the last 10 years: No If all of the above answers are "NO", then may proceed with Cephalosporin use. CC    Antimicrobials this admission:  Azithro 10/21>>10/28 Diflucan 150 mg x1 CTX 10/25>> 10/29 Vanc 10/29>>11/1 Cefepime 10/29>> Metronidazole  11/1>>  Dose adjustments this admission:  10/31 VT 11 - increase to 1500 mg IV q12  Microbiology results:  10/26 BCx x2: neg 10/28 L pleural fluid: neg 10/30 Bronch: no orgs 10/30 L pleural tissue: rare strep intermedius, GNR  Elicia Lamp, PharmD, BCPS Clinical Pharmacist Clinical phone [phone] Please check AMION for all [facility] contact numbers 10/29/2018 9:52 AM

## 2018-10-30 LAB — BASIC METABOLIC PANEL
Anion gap: 7 (ref 5–15)
BUN: <5 mg/dL — ABNORMAL LOW (ref 8–23)
CHLORIDE: 103 mmol/L (ref 98–111)
CO2: 30 mmol/L (ref 22–32)
Calcium: 8.2 mg/dL — ABNORMAL LOW (ref 8.9–10.3)
Creatinine, Ser: 0.73 mg/dL (ref 0.44–1.00)
GFR calc non Af Amer: >60 mL/min (ref >60)
Glucose, Bld: 97 mg/dL (ref 70–99)
POTASSIUM: 3.4 mmol/L — AB (ref 3.5–5.1)
SODIUM: 140 mmol/L (ref 135–145)

## 2018-10-30 LAB — CBC
HEMATOCRIT: 29.3 % — AB (ref 36.0–46.0)
HEMOGLOBIN: 9 g/dL — AB (ref 12.0–15.0)
MCH: 26.2 pg (ref 26.0–34.0)
MCHC: 30.7 g/dL (ref 30.0–36.0)
MCV: 85.4 fL (ref 80.0–100.0)
NRBC: 0 % (ref 0.0–0.2)
Platelets: 442 10*3/uL — ABNORMAL HIGH (ref 150–400)
RBC: 3.43 MIL/uL — ABNORMAL LOW (ref 3.87–5.11)
RDW: 15.4 % (ref 11.5–15.5)
WBC: 15 10*3/uL — AB (ref 4.0–10.5)

## 2018-10-30 LAB — GLUCOSE, CAPILLARY
GLUCOSE-CAPILLARY: 110 mg/dL — AB (ref 70–99)
GLUCOSE-CAPILLARY: 117 mg/dL — AB (ref 70–99)
Glucose-Capillary: 109 mg/dL — ABNORMAL HIGH (ref 70–99)
Glucose-Capillary: 103 mg/dL — ABNORMAL HIGH (ref 70–99)

## 2018-10-30 MED ORDER — PHENOL 1.4 % MT LIQD
1.0000 | OROMUCOSAL | Status: DC | PRN
  Administered 2018-10-30: 1 via OROMUCOSAL
  Filled 2018-10-30: qty 177

## 2018-10-30 MED ORDER — POTASSIUM CHLORIDE CRYS ER 20 MEQ PO TBCR
40.0000 meq | EXTENDED_RELEASE_TABLET | Freq: Once | ORAL | Status: AC
  Administered 2018-10-30: 40 meq via ORAL
  Filled 2018-10-30: qty 2

## 2018-10-30 MED ORDER — MENTHOL 3 MG MT LOZG: 1.0000 | LOZENGE | OROMUCOSAL | Status: DC | PRN

## 2018-10-30 NOTE — Progress Notes (Signed)
      [address]       [place] [address]             [phone]      6 Days Post-Op Procedure(s) (LRB): VIDEO ASSISTED THORACOSCOPY (VATS)/THOROCOTOMY with EVACUATION OF EMPYEMA AND DECORTICATION. (Left) EMPYEMA DRAINAGE (Left) VIDEO BRONCHOSCOPY (N/A)   Subjective:  No new complaints.  Remains on oxygen, per patient will be able to go home once weaned off   Objective: Vital signs in last 24 hours: Temp:  [98.8 F (37.1 C)-99.2 F (37.3 C)] 99.2 F (37.3 C) (11/05 0441) Pulse Rate:  [66-85] 71 (11/05 0441) Cardiac Rhythm: Normal sinus rhythm (11/04 2014) Resp:  [20-26] 20 (11/05 0441) BP: (108-132)/(50-63) 108/52 (11/05 0441) SpO2:  [91 %-98 %] 98 % (11/05 0441)  Intake/Output from previous day: 11/04 0701 - 11/05 0700 In: 836 [P.O.:360; I.V.:376; IV Piggyback:100] Out: 1800 [Urine:1800]  General appearance: alert, cooperative and no distress Heart: regular rate and rhythm Lungs: diminished breath sounds bibasilar Abdomen: soft, non-tender; bowel sounds normal; no masses,  no organomegaly Wound: steri strips in place, mild skin dehiscence is stable  Lab Results: Recent Labs    10/29/18 0253 10/30/18 0318  WBC 16.1* 15.0*  HGB 10.7* 9.0*  HCT 36.3 29.3*  PLT 530* 442*   BMET:  Recent Labs    10/30/18 0318  NA 140  K 3.4*  CL 103  CO2 30  GLUCOSE 97  BUN <5*  CREATININE 0.73  CALCIUM 8.2*    PT/INR: No results for input(s): LABPROT, INR in the last 72 hours. ABG    Component Value Date/Time   PHART 7.407 10/25/2018 0340   HCO3 26.5 10/25/2018 0340   TCO2 27 10/24/2018 1615   O2SAT 95.9 10/25/2018 0340   CBG (last 3)  Recent Labs    10/29/18 1654 10/29/18 2203 10/30/18 0625  GLUCAP 90 106* 109*    Assessment/Plan: S/P Procedure(s) (LRB): VIDEO ASSISTED THORACOSCOPY (VATS)/THOROCOTOMY with EVACUATION OF EMPYEMA AND DECORTICATION. (Left) EMPYEMA DRAINAGE (Left) VIDEO BRONCHOSCOPY (N/A)  1. CV- hemodynamically  stable 2. Pulm- weaning oxygen per primary 3. ID- Strep Intermedius Empyema-on Ancef per primary, sensitive to Vanc and Levaquin, mild fever this morning, leukocytosis improving 4. Dispo- care per primary, continue ABX per ID recs, diligent wound care   LOS: 10 days    [place] Handler 10/30/2018

## 2018-10-30 NOTE — Progress Notes (Signed)
Patient refuses CPAP at this time.

## 2018-10-30 NOTE — Progress Notes (Signed)
[place] TEAM 1 - Stepdown/ICU TEAM  Taylor Leon  UTM:[id number] DOB: 1956-12-04 DOA: 10/19/2018 PCP: Tammi Sou, MD    Brief Narrative:  62yo F w/ a hx of morbid obesity, asthma, and DM2 who as admitted 10/19/2018 with CAP and a small parapneumonic effusion.   A left thoracentesis on 10/28 noted WBC 690, LDH 985. Repeat CT chest showed progression of left effusion with loculation, with pneumonia in left upper and lower lobes, consistent with empyema.  She was transferred to [facility] 10/30 and underwent a L VATS by Dr. Servando Snare 10/30. She was unable to be extubated immediately post operatively secondary to low tidal volumes and therefore transitioned to the ICU. She was successfully extubated on 10/25/2018.  Subjective: Feeling better overall. Hectic cough continues. Some intermittent chest wall pain appreciated. No n/v, or abdom pain. Has begun to get up and out of bed some per her report.   Assessment & Plan:  Sepsis due to L Streptococcus intermedius Pneumonia complicated by empyema S/p VATS 10/30 - post-op care per TCTS - plan for 4 weeks total of abx tx as per PCCM recs - abx narrowed after discussion w/ ID Pharmacist   Asthma Quiescent   Normocytic anemia  Hgb may be trending down - recheck in AM as pt was placed on weight based lovenox prophylaxis 11/4  DM2 CBG controlled at this time   Morbid obesity - Body mass index is 52.89 kg/m.  Severe protein calorie malnutrition Nutrition consult   Suspected OSA  To f/u w/ PCCM as outpt for formal sleep study - has been refusing CPAP in hospital   DVT prophylaxis: lovenox  Code Status: FULL CODE Family Communication: spoke w/ husband at bedside   Disposition Plan: PT/OT - out of bed - check sats w/ ambulation - possible d/c home in 24-48hrs   Consultants:  PCCM TCTS  Antimicrobials:  10/25 azithro > 10/28 10/25 ceftriaxone > 10/29 10/29 cefepime > 11/4 11/1 flagyl > 11/4 10/29 vancomycin > 10/31 Cefazolin 11/4  >  Objective: Blood pressure 112/62, pulse 85, temperature 99.2 F (37.3 C), temperature source Oral, resp. rate 20, height 5\' 7"  (1.702 m), weight (!) 153.2 kg, SpO2 95 %.  Intake/Output Summary (Last 24 hours) at 10/30/2018 1615 Last data filed at 10/30/2018 1500 Gross per 24 hour  Intake 835.95 ml  Output 1250 ml  Net -414.05 ml   Filed Weights   10/24/18 0939 10/25/18 0420 10/27/18 0342  Weight: (!) 159.8 kg (!) 158.8 kg (!) 153.2 kg   Examination: General: No acute respiratory distress in bed  Lungs: poor air movement in L base w/o change - no wheezing Cardiovascular: RRR - no M or rub  Abdomen: NT, obese, soft, bowel sounds positive, no rebound Extremities: trace B LE edema w/o change   CBC: Recent Labs  Lab 10/27/18 0535 10/29/18 0253 10/30/18 0318  WBC 13.5* 16.1* 15.0*  HGB 9.2* 10.7* 9.0*  HCT 30.9* 36.3 29.3*  MCV 87.0 86.6 85.4  PLT 451* 530* 568*   Basic Metabolic Panel: Recent Labs  Lab 10/25/18 0325 10/26/18 0433 10/27/18 0535 10/30/18 0318  NA 139 141 142 140  K 3.8 3.6 4.0 3.4*  CL 107 109 109 103  CO2 26 28 28 30   GLUCOSE 187* 93 103* 97  BUN 8 12 7* <5*  CREATININE 0.76 0.84 0.79 0.73  CALCIUM 8.3* 8.2* 8.4* 8.2*  MG 2.4 2.3  --   --   PHOS 3.4 2.8  --   --  GFR: Estimated Creatinine Clearance: 113 mL/min (by C-G formula based on SCr of 0.73 mg/dL).  Liver Function Tests: Recent Labs  Lab 10/25/18 0325 10/26/18 0433  AST  --  20  ALT  --  25  ALKPHOS  --  90  BILITOT  --  0.5  PROT  --  5.3*  ALBUMIN 1.6* 1.6*    HbA1C: Hemoglobin A1C  Date/Time Value Ref Range Status  10/10/2018 08:24 AM 5.6 4.0 - 5.6 % Final  03/14/2018 10:13 AM 5.6  Final   Hgb A1c MFr Bld  Date/Time Value Ref Range Status  06/20/2017 10:30 AM 6.2 4.6 - 6.5 % Final    Comment:    Glycemic Control Guidelines for People with Diabetes:Non Diabetic:  <6%Goal of Therapy: <7%Additional Action Suggested:  >8%   06/22/2016 08:42 AM 6.0 4.6 - 6.5 % Final     Comment:    Glycemic Control Guidelines for People with Diabetes:Non Diabetic:  <6%Goal of Therapy: <7%Additional Action Suggested:  >8%     CBG: Recent Labs  Lab 10/29/18 1254 10/29/18 1654 10/29/18 2203 10/30/18 0625 10/30/18 1108  GLUCAP 99 90 106* 109* 110*    Recent Results (from the past 240 hour(s))  Culture, body fluid-bottle     Status: None   Collection Time: 10/22/18  4:21 PM  Result Value Ref Range Status   Specimen Description FLUID PLEURAL LEFT  Final   Special Requests BOTTLES DRAWN AEROBIC AND ANAEROBIC  Final   Culture   Final    NO GROWTH 5 DAYS Performed at [facility], [place] [address], [place], [place] [address]    Report Status 10/27/2018 FINAL  Final  Gram stain     Status: None   Collection Time: 10/22/18  4:21 PM  Result Value Ref Range Status   Specimen Description FLUID PLEURAL LEFT  Final   Special Requests NONE  Final   Gram Stain   Final    WBC PRESENT,BOTH PMN AND MONONUCLEAR NO ORGANISMS SEEN CYTOSPIN SMEAR Performed at [facility], [address] [place], [place] [address]    Report Status 10/22/2018 FINAL  Final  MRSA PCR Screening     Status: None   Collection Time: 10/23/18  9:25 PM  Result Value Ref Range Status   MRSA by PCR NEGATIVE NEGATIVE Final    Comment:        The GeneXpert MRSA Assay (FDA approved for NASAL specimens only), is one component of a comprehensive MRSA colonization surveillance program. It is not intended to diagnose MRSA infection nor to guide or monitor treatment for MRSA infections. Performed at [facility], [place] [address] [place], [place] [address]   Fungus Culture With Stain     Status: None   Collection Time: 10/24/18 11:26 AM  Result Value Ref Range Status   Fungus Stain Final report  Final    Comment: (NOTE) Performed At: [facility] [place], [place] [id number] Rush Farmer MD [id number]    Fungus (Mycology) Culture PENDING  Incomplete    Fungal Source BRONCHIAL WASHINGS  Corrected    Comment: Performed at [facility], [place] [address], [place], [place] [address] CORRECTED ON 10/30 AT 1414: PREVIOUSLY REPORTED AS BRONCHIAL ALVEOLAR LAVAGE LEFT   Culture, respiratory     Status: None   Collection Time: 10/24/18 11:26 AM  Result Value Ref Range Status   Specimen Description BRONCHIAL WASHINGS  Final   Special Requests PATIENT ON FOLLOWING CEFEPIME  VANC ROCEPHIN  Final   Gram Stain NO WBC SEEN NO ORGANISMS SEEN   Final   Culture   Final    NO GROWTH 2 DAYS Performed at [facility], [address] [place], [place] [address]    Report Status 10/26/2018 FINAL  Final  Acid Fast Smear (AFB)     Status: None   Collection Time: 10/24/18 11:26 AM  Result Value Ref Range Status   AFB Specimen Processing Concentration  Final   Acid Fast Smear Negative  Final    Comment: (NOTE) Performed At: [facility] [place], [place] [id number] Rush Farmer MD XB:[id number]    Source (AFB) BRONCHIAL WASHINGS  Corrected    Comment: Performed at [facility], [place] [address], [place], [place] [address] CORRECTED ON 10/30 AT 1414: PREVIOUSLY REPORTED AS BRONCHIAL ALVEOLAR LAVAGE LEFT   Fungus Culture Result     Status: None   Collection Time: 10/24/18 11:26 AM  Result Value Ref Range Status   Result 1 Comment  Final    Comment: (NOTE) KOH/Calcofluor preparation:  no fungus observed. Performed At: [facility] [place], [place] [id number] Rush Farmer MD [id number]   Anaerobic culture     Status: None   Collection Time: 10/24/18 12:19 PM  Result Value Ref Range Status   Specimen Description FLUID PLEURAL LEFT  Final   Special Requests PATIENT ON FOLLOWING CEFEPIME VANC ROCEPHIN  Final   Culture   Final    NO ANAEROBES ISOLATED Performed at [facility], [place] [address] [place], [place] [address]    Report Status 10/29/2018 FINAL  Final  Body fluid culture      Status: None   Collection Time: 10/24/18 12:19 PM  Result Value Ref Range Status   Specimen Description FLUID PLEURAL LEFT  Final   Special Requests PATIENT ON FOLLOWING CEFEPIME VANC ROCEPHIN  Final   Gram Stain   Final    FEW WBC PRESENT, PREDOMINANTLY PMN NO ORGANISMS SEEN    Culture   Final    NO GROWTH 3 DAYS Performed at [facility], [place] [address] [place], [place] [address]    Report Status 10/28/2018 FINAL  Final  Fungus Culture With Stain     Status: None (Preliminary result)   Collection Time: 10/24/18 12:19 PM  Result Value Ref Range Status   Fungus Stain Final report  Final    Comment: (NOTE) Performed At: [facility] [place], [place] [id number] Rush Farmer MD [id number]    Fungus (Mycology) Culture PENDING  Incomplete   Fungal Source PLEURAL  Final    Comment: LEFT Performed at [facility], [place] [address], [place], [place] [address]   Acid Fast Smear (AFB)     Status: None   Collection Time: 10/24/18 12:19 PM  Result Value Ref Range Status   AFB Specimen Processing Concentration  Final   Acid Fast Smear Negative  Final    Comment: (NOTE) Performed At: [facility] [place], [place] [id number] Rush Farmer MD XN:[id number]    Source (AFB) PLEURAL  Final    Comment: LEFT Performed at [facility], [place] [address] [place],  [address]   Fungus Culture Result     Status: None   Collection Time: 10/24/18 12:19 PM  Result Value Ref Range Status   Result 1 Comment  Final    Comment: (NOTE) KOH/Calcofluor preparation:  no fungus  observed. Performed At: [facility] [place], [place] [id number] Rush Farmer MD [id number]   Anaerobic culture     Status: None   Collection Time: 10/24/18 12:21 PM  Result Value Ref Range Status   Specimen Description FLUID PLEURAL LEFT 2  Final   Special Requests PATIENT ON FOLLOWING CEFEPIME VANC ROCEPHIN  Final    Culture   Final    NO ANAEROBES ISOLATED Performed at [facility], [place] [address] [place], [place] [address]    Report Status 10/29/2018 FINAL  Final  Body fluid culture     Status: None   Collection Time: 10/24/18 12:21 PM  Result Value Ref Range Status   Specimen Description PLEURAL LEFT 2  Final   Special Requests PATIENT ON FOLLOWING CEFEPIME VANC ROCEPHIN  Final   Gram Stain NO WBC SEEN NO ORGANISMS SEEN   Final   Culture   Final    NO GROWTH 3 DAYS Performed at [facility], [place] [address], [place], [place] [address]    Report Status 10/28/2018 FINAL  Final  Fungus Culture With Stain     Status: None (Preliminary result)   Collection Time: 10/24/18 12:21 PM  Result Value Ref Range Status   Fungus Stain Final report  Final    Comment: (NOTE) Performed At: [facility] [place], [place] [id number] Rush Farmer MD [id number]    Fungus (Mycology) Culture PENDING  Incomplete   Fungal Source PLEURAL  Final    Comment: LEFT 2 Performed at [facility], [place] [address] [place], [place] [address]   Acid Fast Smear (AFB)     Status: None   Collection Time: 10/24/18 12:21 PM  Result Value Ref Range Status   AFB Specimen Processing Concentration  Final   Acid Fast Smear Negative  Final    Comment: (NOTE) Performed At: [facility] [place], [place] [id number] Rush Farmer MD [id number]    Source (AFB) PLEURAL  Final    Comment: LEFT 2 Performed at [facility], [place] [address], [place], [place] [address]   Fungus Culture Result     Status: None   Collection Time: 10/24/18 12:21 PM  Result Value Ref Range Status   Result 1 Comment  Final    Comment: (NOTE) KOH/Calcofluor preparation:  no fungus observed. Performed At: [facility] [place], [place] [id number] Rush Farmer MD AS:[id number]   Fungus Culture With Stain     Status: None (Preliminary result)    Collection Time: 10/24/18 12:23 PM  Result Value Ref Range Status   Fungus Stain Final report  Final    Comment: (NOTE) Performed At: [facility] [place], [place] [id number] Rush Farmer MD [id number]    Fungus (Mycology) Culture PENDING  Incomplete   Fungal Source TISSUE  Final    Comment: PLEURAL LEFT Performed at [facility], [place] [address] [place], [place] [address]   Aerobic/Anaerobic Culture (surgical/deep wound)     Status: None   Collection Time: 10/24/18 12:23 PM  Result Value Ref Range Status   Specimen Description TISSUE PLEURAL LEFT  Final   Special Requests PATIENT ON FOLLOWING CEFEPIME ROCEPHIN VANC  Final   Gram Stain   Final    RARE WBC PRESENT, PREDOMINANTLY PMN RARE GRAM NEGATIVE RODS    Culture   Final    RARE STREPTOCOCCUS INTERMEDIUS NO ANAEROBES ISOLATED Performed at [facility],  [address], [place], [place] [address]    Report Status 10/29/2018 FINAL  Final   Organism ID, Bacteria STREPTOCOCCUS INTERMEDIUS  Final      Susceptibility   Streptococcus intermedius - MIC*    ERYTHROMYCIN >=8 RESISTANT Resistant     LEVOFLOXACIN 0.5 SENSITIVE Sensitive     VANCOMYCIN 0.5 SENSITIVE Sensitive     * RARE STREPTOCOCCUS INTERMEDIUS  Acid Fast Smear (AFB)     Status: None   Collection Time: 10/24/18 12:23 PM  Result Value Ref Range Status   AFB Specimen Processing Comment  Final    Comment: Tissue Grinding and Digestion/Decontamination   Acid Fast Smear Negative  Final    Comment: (NOTE) Performed At: [facility] [place], [place] [id number] Rush Farmer MD [id number]    Source (AFB) TISSUE  Final    Comment: PLEURAL LEFT Performed at [facility], [place] [address] [place], [place] [address]   Fungus Culture Result     Status: None   Collection Time: 10/24/18 12:23 PM  Result Value Ref Range Status   Result 1 Comment  Final    Comment: (NOTE) KOH/Calcofluor preparation:   no fungus observed. Performed At: [facility] [place], [place] [id number] Rush Farmer MD XJ:[id number]      Scheduled Meds: . bisacodyl  10 mg Oral Daily  . enoxaparin (LOVENOX) injection  0.5 mg/kg Subcutaneous Daily  . insulin aspart  0-15 Units Subcutaneous TID WC  . ipratropium-albuterol  3 mL Nebulization TID  . senna-docusate  1 tablet Oral QHS   Continuous Infusions: . sodium chloride Stopped (10/28/18 2224)  .  ceFAZolin (ANCEF) IV 2 g (10/30/18 1500)  . potassium chloride       LOS: 10 days   Cherene Altes, MD [facility] Office  [phone] Pager - Text Page per Amion  If 7PM-7AM, please contact night-coverage per Amion 10/30/2018, 4:15 PM

## 2018-10-31 ENCOUNTER — Inpatient Hospital Stay (HOSPITAL_COMMUNITY): Payer: Self-pay

## 2018-10-31 DIAGNOSIS — R0602 Shortness of breath: Secondary | ICD-10-CM

## 2018-10-31 LAB — CBC
HEMATOCRIT: 29 % — AB (ref 36.0–46.0)
Hemoglobin: 8.9 g/dL — ABNORMAL LOW (ref 12.0–15.0)
MCH: 26.3 pg (ref 26.0–34.0)
MCHC: 30.7 g/dL (ref 30.0–36.0)
MCV: 85.5 fL (ref 80.0–100.0)
NRBC: 0 % (ref 0.0–0.2)
Platelets: 447 10*3/uL — ABNORMAL HIGH (ref 150–400)
RBC: 3.39 MIL/uL — AB (ref 3.87–5.11)
RDW: 15.3 % (ref 11.5–15.5)
WBC: 13 10*3/uL — ABNORMAL HIGH (ref 4.0–10.5)

## 2018-10-31 LAB — GLUCOSE, CAPILLARY
GLUCOSE-CAPILLARY: 98 mg/dL (ref 70–99)
GLUCOSE-CAPILLARY: 127 mg/dL — AB (ref 70–99)
Glucose-Capillary: 106 mg/dL — ABNORMAL HIGH (ref 70–99)
Glucose-Capillary: 115 mg/dL — ABNORMAL HIGH (ref 70–99)

## 2018-10-31 MED ORDER — METRONIDAZOLE 500 MG PO TABS
500.0000 mg | ORAL_TABLET | Freq: Three times a day (TID) | ORAL | Status: DC
  Administered 2018-10-31 – 2018-11-01 (×4): 500 mg via ORAL
  Filled 2018-10-31 (×4): qty 1

## 2018-10-31 MED ORDER — BENZONATATE 100 MG PO CAPS
200.0000 mg | ORAL_CAPSULE | Freq: Three times a day (TID) | ORAL | Status: DC
  Administered 2018-10-31 – 2018-11-01 (×4): 200 mg via ORAL
  Filled 2018-10-31 (×4): qty 2

## 2018-10-31 MED ORDER — DOXYCYCLINE HYCLATE 100 MG PO TABS
100.0000 mg | ORAL_TABLET | Freq: Two times a day (BID) | ORAL | Status: DC
  Administered 2018-10-31 – 2018-11-01 (×3): 100 mg via ORAL
  Filled 2018-10-31 (×3): qty 1

## 2018-10-31 NOTE — Progress Notes (Signed)
PT Cancellation Note  Patient Details Name: Taylor Leon MRN: [id number] DOB: [birth year]   Cancelled Treatment:    Reason Eval/Treat Not Completed: Fatigue/lethargy limiting ability to participate. Attempted to see pt, but reports she just ambulated in hallway with nurse tech and was too fatigued to attempt out of bed mobility.  Confirmed with NT that pt ambulated the length of the unit, O2 dropped to 75% on 2L, but returned to >90% on return to bed.  Will attempt to see for formal eval in AM on Thursday.    Michel Santee 10/31/2018, 3:57 PM

## 2018-10-31 NOTE — Progress Notes (Signed)
RT offered pt CPAP and pt stated she would rather wait and do her sleep study before she wears one. RT told pt to feel free to call if she changes her mind. RT will continue to monitor.

## 2018-10-31 NOTE — Evaluation (Signed)
Occupational Therapy Evaluation Patient Details Name: Taylor Leon MRN: [id number] DOB: [birth year] Today's Date: 10/31/2018    History of Present Illness 62yo F w/ a hx of morbid obesity, asthma, and DM2 who as admitted 10/19/2018 with CAP and a small parapneumonic effusion. A left thoracentesis on 10/28 noted WBC 690, LDH 985. Repeat CT chest showed progression of left effusion with loculation, with pneumonia in left upper and lower lobes, consistent with empyema. She was transferred to [facility] 10/30 and underwent a L VATS by Dr. Servando Snare 10/30. She was unable to be extubated immediately post operatively secondary to low tidal volumes and therefore transitioned to the ICU. She was successfully extubated on 10/25/2018. S/P Procedure(s) (LRB): VIDEO ASSISTED THORACOSCOPY (VATS)/THOROCOTOMY with EVACUATION OF EMPYEMA AND DECORTICATION. (Left) Dx in chart CAP.   Clinical Impression   Pt admitted as above currently demonstrating deficits in her ability to perform ADL/selfcare tasks and functional mobility secondary to generalized weakness and deconditioned status (see OT problem list below). She is currently Max A LB ADL's and Mod A sit to stand, she should benefit from acute OT to assist in maximizing independence in ADL's.    Follow Up Recommendations  SNF;Supervision/Assistance - 24 hour;Other (comment)(Could do HHOT w/ intermittent supervision, dependent on progress)    Equipment Recommendations  Other (comment)(Defer to next venue)    Recommendations for Other Services       Precautions / Restrictions Precautions Precautions: Fall Precaution Comments: Pt reports a h/o "bad knees" Restrictions Weight Bearing Restrictions: No      Mobility Bed Mobility Overal bed mobility: Modified Independent             General bed mobility comments: Increased time - pt w/ noted SOB/generalized weakness and benefits from rest breaks and EC techniques.   Transfers Overall transfer level: Needs  assistance Equipment used: (Pt declined RW use however was heavily reliant on UE support/holding onto bed rails, tray table and OT. She should benefit from bari RW for increased stability and safety) Transfers: Sit to/from Stand Sit to Stand: Mod assist         General transfer comment: Pt declined RW use however was heavily reliant on UE support/holding onto bed rails, tray table and OT. She should benefit from bari-RW for increased stability & safety.    Balance Overall balance assessment: Needs assistance Sitting-balance support: Feet supported;Bilateral upper extremity supported Sitting balance-Leahy Scale: Fair     Standing balance support: Bilateral upper extremity supported Standing balance-Leahy Scale: Poor Standing balance comment: Heavily reliant on UE support with static standing noted                           ADL either performed or assessed with clinical judgement   ADL Overall ADL's : Needs assistance/impaired Eating/Feeding: Set up;Bed level   Grooming: Wash/dry hands;Wash/dry face;Oral care;Set up;Bed level   Upper Body Bathing: Set up;Bed level   Lower Body Bathing: Moderate assistance;Bed level;Sit to/from stand   Upper Body Dressing : Minimal assistance;Sitting   Lower Body Dressing: Maximal assistance;Sit to/from stand   Toilet Transfer: Moderate assistance;BSC;Ambulation(Simulated transfer w/ sit to stand from EOB and took several steps toward [facility] to reposition) Toilet Transfer Details (indicate cue type and reason): Pt heaviliy relaiant on UE support however declined RW use x2. Would benefit from bari-walker for increased support/stability in standing and w/ transfers. Toileting- Clothing Manipulation and Hygiene: Maximal assistance;Sit to/from stand       Functional mobility during ADLs: Moderate  assistance(Pt declined RW use however was heavily reliant on UE support/holding onto bed rails and tray table and OT. Would benefit from bari  RW) General ADL Comments: Pt was seen for OT assessment followed by pt education in role of OT and plan of care. She is currently limited in her ability to perform ADL and functional transfers due to generalized weakness and deconditioning. Will follow acutely.     Vision Baseline Vision/History: Wears glasses Wears Glasses: Reading only Patient Visual Report: No change from baseline Vision Assessment?: No apparent visual deficits     Perception     Praxis      Pertinent Vitals/Pain Pain Assessment: Faces Faces Pain Scale: Hurts a little bit Pain Location: L upper arm - pt has bandage that she reports is a blister that RN staff is monitoring Pain Descriptors / Indicators: Grimacing Pain Intervention(s): Limited activity within patient's tolerance;Monitored during session;Repositioned     Hand Dominance Right   Extremity/Trunk Assessment Upper Extremity Assessment Upper Extremity Assessment: Overall WFL for tasks assessed;Generalized weakness   Lower Extremity Assessment Lower Extremity Assessment: Defer to PT evaluation       Communication Communication Communication: No difficulties   Cognition Arousal/Alertness: Awake/alert Behavior During Therapy: WFL for tasks assessed/performed Overall Cognitive Status: Within Functional Limits for tasks assessed                                     General Comments  Pt becomes SOB with little activity. Requires/benefits from frequent rest breaks.     Exercises     Shoulder Instructions      Home Living Family/patient expects to be discharged to:: Private residence Living Arrangements: Spouse/significant other Available Help at Discharge: Available 24 hours/day Type of Home: House Home Access: Stairs to enter [employer] of Steps: 5-6 Entrance Stairs-Rails: Left(When entering from garage) Home Layout: One level     Bathroom Shower/Tub: Walk-in shower;Door   [employer] Toilet: Handicapped  height(Comfort height) Bathroom Accessibility: Yes   Home Equipment: Grab bars - tub/shower          Prior Functioning/Environment Level of Independence: Independent                 OT Problem List: Decreased strength;Decreased activity tolerance;Decreased knowledge of use of DME or AE;Cardiopulmonary status limiting activity;Obesity      OT Treatment/Interventions: Self-care/ADL training;DME and/or AE instruction;Therapeutic activities;Energy conservation;Patient/family education    OT Goals(Current goals can be found in the care plan section) Acute Rehab OT Goals Patient Stated Goal: Get better OT Goal Formulation: With patient Time For Goal Achievement: 11/14/18 Potential to Achieve Goals: Good  OT Frequency: Min 2X/week   Barriers to D/C:            Co-evaluation              AM-PAC PT "6 Clicks" Daily Activity     Outcome Measure Help from another person eating meals?: None Help from another person taking care of personal grooming?: A Little Help from another person toileting, which includes using toliet, bedpan, or urinal?: A Lot Help from another person bathing (including washing, rinsing, drying)?: A Lot Help from another person to put on and taking off regular upper body clothing?: A Little Help from another person to put on and taking off regular lower body clothing?: A Lot 6 Click Score: 16   End of Session Equipment Utilized During Treatment: Oxygen  Activity Tolerance:  Patient tolerated treatment well Patient left: in bed;with call bell/phone within reach;Other (comment)(With respitory therapist in room for treatment)  OT Visit Diagnosis: Muscle weakness (generalized) (M62.81)                Time: 3094-0768 OT Time Calculation (min): 22 min Charges:  OT General Charges $OT Visit: 1 Visit OT Evaluation $OT Eval Moderate Complexity: 1 Mod    Khaiden Segreto [place], OTR/L 10/31/2018, 8:55 AM

## 2018-10-31 NOTE — Progress Notes (Signed)
[facility]                                                                              Patient Demographics  Taylor Leon, is a 62 y.o. female, DOB - Oct 10, 1956, ZLD:[id number]  Admit date - 10/19/2018   Admitting Physician Georgette Shell, MD  Outpatient Primary MD for the patient is McGowen, Adrian Blackwater, MD  Outpatient specialists:   LOS - 11  days   Medical records reviewed and are as summarized below:    Chief Complaint  Patient presents with  . Chest Pain       Brief summary   62yo F w/ a hx of morbid obesity, asthma, and DM2 who as admitted 10/19/2018 with CAP and a small parapneumonic effusion.  left thoracentesis on 10/28 noted WBC 690, LDH 985. Repeat CT chest showed progression of left effusion with loculation, with pneumonia in left upper and lower lobes, consistent with empyema. She was transferred to [facility] 10/30 and underwent a L VATS by Dr. Servando Snare 10/30. She was unable to be extubated immediately post operatively secondary to low tidal volumes and therefore transitioned to the ICU. She was successfully extubated on 10/25/2018.   Assessment & Plan   Principal problem Sepsis secondary to Streptococcus intermedius pneumonia complicated by empyema, left lung -Status post VATS 10/30, chest tubes removed -Patient was placed on vancomycin and cefazolin, vancomycin was discontinued.  CCM had recommended transitioning to oral Augmentin for total of 3 to 4 weeks however patient has penicillin allergy.  Placed on oral doxycycline and Flagyl.  Active Problems:  Acute respiratory failure with hypoxia secondary to #1 -Home oxygen evaluation, likely also has underlying OSA, recommend home sleep study as outpatient. -Added Tessalon Perles    Asthmatic bronchitis -Currently stable, no wheezing    Obesity, Class III, BMI 40-49.9 (morbid obesity) ([place]) -Patient counseled on diet and weight control    Type 2 diabetes mellitus with hyperlipidemia  ([place]) -CBGs currently controlled  Severe protein calorie malnutrition Continue nutritional supplements   Code Status: Full CODE STATUS DVT Prophylaxis: Lovenox Family Communication: Discussed in detail with the patient, all imaging results, lab results explained to the patient    Disposition Plan: PT evaluation recommending skilled nursing facility.  Will transition to oral antibiotics today, if no acute issues, likely DC in a.m.  Time Spent in minutes   35 minutes  Procedures:  VATS  Consultants:   PCCM T CTS  Antimicrobials:      Medications  Scheduled Meds: . benzonatate  200 mg Oral TID  . bisacodyl  10 mg Oral Daily  . enoxaparin (LOVENOX) injection  0.5 mg/kg Subcutaneous Daily  . insulin aspart  0-15 Units Subcutaneous TID WC  . ipratropium-albuterol  3 mL Nebulization TID  . senna-docusate  1 tablet Oral QHS   Continuous Infusions: . sodium chloride Stopped (10/28/18 2224)  .  ceFAZolin (ANCEF) IV 2 g (10/31/18 0547)   PRN Meds:.sodium chloride, albuterol, bisacodyl, bisacodyl, docusate, guaiFENesin-dextromethorphan, menthol-cetylpyridinium, oxyCODONE-acetaminophen, phenol   Antibiotics   Anti-infectives (From admission, onward)   Start     Dose/Rate Route Frequency Ordered Stop   10/29/18 2200  ceFAZolin (ANCEF) IVPB 2g/100 mL premix     2 g 200 mL/hr over 30 Minutes Intravenous Every 8 hours 10/29/18 1528     10/26/18 1300  metroNIDAZOLE (FLAGYL) tablet 500 mg  Status:  Discontinued     500 mg Oral 3 times daily 10/26/18 1200 10/29/18 1525   10/25/18 1900  vancomycin (VANCOCIN) 1,500 mg in sodium chloride 0.9 % 500 mL IVPB  Status:  Discontinued     1,500 mg 250 mL/hr over 120 Minutes Intravenous Every 12 hours 10/25/18 1817 10/26/18 1200   10/24/18 2200  ceFEPIme (MAXIPIME) 2 g in sodium chloride 0.9 % 100 mL IVPB  Status:  Discontinued     2 g 200 mL/hr over 30 Minutes Intravenous Every 8 hours 10/24/18 1546 10/29/18 1525   10/24/18 0600   vancomycin (VANCOCIN) IVPB 1000 mg/200 mL premix  Status:  Discontinued     1,000 mg 200 mL/hr over 60 Minutes Intravenous Every 12 hours 10/23/18 1532 10/25/18 1817   10/23/18 2200  ceFEPIme (MAXIPIME) 1 g in sodium chloride 0.9 % 100 mL IVPB  Status:  Discontinued     1 g 200 mL/hr over 30 Minutes Intravenous Every 8 hours 10/23/18 1602 10/24/18 1546   10/23/18 1600  vancomycin (VANCOCIN) 2,500 mg in sodium chloride 0.9 % 500 mL IVPB     2,500 mg 250 mL/hr over 120 Minutes Intravenous  Once 10/23/18 1532 10/23/18 2130   10/23/18 1600  ceFEPIme (MAXIPIME) 2 g in sodium chloride 0.9 % 100 mL IVPB     2 g 200 mL/hr over 30 Minutes Intravenous  Once 10/23/18 1602 10/23/18 2130   10/23/18 1100  fluconazole (DIFLUCAN) tablet 150 mg     150 mg Oral  Once 10/23/18 1055 10/23/18 1215   10/20/18 0800  azithromycin (ZITHROMAX) 500 mg in sodium chloride 0.9 % 250 mL IVPB  Status:  Discontinued     500 mg 250 mL/hr over 60 Minutes Intravenous Every 24 hours 10/19/18 1416 10/22/18 1005   10/19/18 1430  cefTRIAXone (ROCEPHIN) 2 g in sodium chloride 0.9 % 100 mL IVPB  Status:  Discontinued     2 g 200 mL/hr over 30 Minutes Intravenous Every 24 hours 10/19/18 1416 10/23/18 1502   10/19/18 0931  azithromycin (ZITHROMAX) 500 MG injection    Note to Pharmacy:  Taylor Leon   : cabinet override      10/19/18 0931 10/19/18 0941   10/19/18 0900  cefTRIAXone (ROCEPHIN) 2 g in sodium chloride 0.9 % 100 mL IVPB  Status:  Discontinued     2 g 200 mL/hr over 30 Minutes Intravenous Every 24 hours 10/19/18 0851 10/19/18 1434   10/19/18 0900  azithromycin (ZITHROMAX) 500 mg in sodium chloride 0.9 % 250 mL IVPB  Status:  Discontinued     500 mg 250 mL/hr over 60 Minutes Intravenous Every 24 hours 10/19/18 0851 10/19/18 1435        Subjective:   Taylor Leon was seen and examined today.  Coughing but no significant phlegm, no fevers or chills.  Still feeling weak.  Trying to wean off O2. Patient denies dizziness,   abdominal pain, N/V/D/C, new weakness, numbess, tingling. No acute events overnight.    Objective:   Vitals:   10/30/18 2118 10/30/18 2122 10/31/18 0636 10/31/18 0700  BP:   (!) 112/58   Pulse:   (!) 58   Resp:   (!) 26   Temp:   99 F (37.2 C)   TempSrc:   Oral  SpO2: 90% 93% 96% 98%  Weight:      Height:        Intake/Output Summary (Last 24 hours) at 10/31/2018 1303 Last data filed at 10/31/2018 0402 Gross per 24 hour  Intake 300 ml  Output 1700 ml  Net -1400 ml     Wt Readings from Last 3 Encounters:  10/27/18 (!) 153.2 kg  10/15/18 (!) 154.7 kg  10/10/18 (!) 154.8 kg     Exam  General: Alert and oriented x 3, NAD  Eyes:  Cardiovascular: S1 S2 auscultated,  Regular rate and rhythm.  Respiratory: Decreased breath sound at the bases  Gastrointestinal: Soft, nontender, nondistended, + bowel sounds  Ext: no pedal edema bilaterally  Neuro: AAOx3, Cr N's II- XII. Strength 5/5 upper and lower extremities bilaterally  Musculoskeletal: No digital cyanosis, clubbing  Skin: No rashes  Psych: Normal affect and demeanor, alert and oriented x3    Data Reviewed:  I have personally reviewed following labs and imaging studies  Micro Results Recent Results (from the past 240 hour(s))  Culture, body fluid-bottle     Status: None   Collection Time: 10/22/18  4:21 PM  Result Value Ref Range Status   Specimen Description FLUID PLEURAL LEFT  Final   Special Requests BOTTLES DRAWN AEROBIC AND ANAEROBIC  Final   Culture   Final    NO GROWTH 5 DAYS Performed at [facility], [address] [place], [place] [address]    Report Status 10/27/2018 FINAL  Final  Gram stain     Status: None   Collection Time: 10/22/18  4:21 PM  Result Value Ref Range Status   Specimen Description FLUID PLEURAL LEFT  Final   Special Requests NONE  Final   Gram Stain   Final    WBC PRESENT,BOTH PMN AND MONONUCLEAR NO ORGANISMS SEEN CYTOSPIN SMEAR Performed at [facility], [address] [place], [place] [address]    Report Status 10/22/2018 FINAL  Final  MRSA PCR Screening     Status: None   Collection Time: 10/23/18  9:25 PM  Result Value Ref Range Status   MRSA by PCR NEGATIVE NEGATIVE Final    Comment:        The GeneXpert MRSA Assay (FDA approved for NASAL specimens only), is one component of a comprehensive MRSA colonization surveillance program. It is not intended to diagnose MRSA infection nor to guide or monitor treatment for MRSA infections. Performed at [facility], [place] [address] [place], [place] [address]   Fungus Culture With Stain     Status: None   Collection Time: 10/24/18 11:26 AM  Result Value Ref Range Status   Fungus Stain Final report  Final    Comment: (NOTE) Performed At: [facility] [place], [place] [id number] Rush Farmer MD [id number]    Fungus (Mycology) Culture PENDING  Incomplete   Fungal Source BRONCHIAL WASHINGS  Corrected    Comment: Performed at [facility], [place] [address], [place], [place] [address] CORRECTED ON 10/30 AT 1414: PREVIOUSLY REPORTED AS BRONCHIAL ALVEOLAR LAVAGE LEFT   Culture, respiratory     Status: None   Collection Time: 10/24/18 11:26 AM  Result Value Ref Range Status   Specimen Description BRONCHIAL WASHINGS  Final   Special Requests PATIENT ON FOLLOWING CEFEPIME VANC ROCEPHIN  Final   Gram Stain NO WBC SEEN NO ORGANISMS SEEN   Final   Culture   Final    NO  GROWTH 2 DAYS Performed at [facility], [place] [address], [place], [place] [address]    Report Status 10/26/2018 FINAL  Final  Acid Fast Smear (AFB)     Status: None   Collection Time: 10/24/18 11:26 AM  Result Value Ref Range Status   AFB Specimen Processing Concentration  Final   Acid Fast Smear Negative  Final    Comment: (NOTE) Performed At: [facility] [place], [place] [id number] Rush Farmer MD [id number]:[id number]    Source (AFB) BRONCHIAL WASHINGS   Corrected    Comment: Performed at [facility], [place] [address], [place], [place] [address] CORRECTED ON 10/30 AT 1414: PREVIOUSLY REPORTED AS BRONCHIAL ALVEOLAR LAVAGE LEFT   Fungus Culture Result     Status: None   Collection Time: 10/24/18 11:26 AM  Result Value Ref Range Status   Result 1 Comment  Final    Comment: (NOTE) KOH/Calcofluor preparation:  no fungus observed. Performed At: [facility] [place], [place] [id number] Rush Farmer MD [id number]   Anaerobic culture     Status: None   Collection Time: 10/24/18 12:19 PM  Result Value Ref Range Status   Specimen Description FLUID PLEURAL LEFT  Final   Special Requests PATIENT ON FOLLOWING CEFEPIME VANC ROCEPHIN  Final   Culture   Final    NO ANAEROBES ISOLATED Performed at [facility], [place] [address] [place], [place] [address]    Report Status 10/29/2018 FINAL  Final  Body fluid culture     Status: None   Collection Time: 10/24/18 12:19 PM  Result Value Ref Range Status   Specimen Description FLUID PLEURAL LEFT  Final   Special Requests PATIENT ON FOLLOWING CEFEPIME VANC ROCEPHIN  Final   Gram Stain   Final    FEW WBC PRESENT, PREDOMINANTLY PMN NO ORGANISMS SEEN    Culture   Final    NO GROWTH 3 DAYS Performed at [facility], [place] [address], [place], [place] [address]    Report Status 10/28/2018 FINAL  Final  Fungus Culture With Stain     Status: None (Preliminary result)   Collection Time: 10/24/18 12:19 PM  Result Value Ref Range Status   Fungus Stain Final report  Final    Comment: (NOTE) Performed At: [facility] [place], [place] [id number] Rush Farmer MD [id number]    Fungus (Mycology) Culture PENDING  Incomplete   Fungal Source PLEURAL  Final    Comment: LEFT Performed at [facility], [place] [address], [place], [place] [address]   Acid Fast Smear (AFB)     Status: None   Collection Time: 10/24/18 12:19 PM  Result Value  Ref Range Status   AFB Specimen Processing Concentration  Final   Acid Fast Smear Negative  Final    Comment: (NOTE) Performed At: [facility] [place], [place] [id number] Rush Farmer MD [id number]:[id number]    Source (AFB) PLEURAL  Final    Comment: LEFT Performed at [facility], [place] [address] [place], [place] [address]   Fungus Culture Result     Status: None   Collection Time: 10/24/18 12:19 PM  Result Value Ref Range Status   Result 1 Comment  Final    Comment: (NOTE) KOH/Calcofluor preparation:  no fungus observed. Performed At: [facility] [address] [place], [place] [id number] Rush Farmer MD [id number]   Anaerobic culture     Status: None  Collection Time: 10/24/18 12:21 PM  Result Value Ref Range Status   Specimen Description FLUID PLEURAL LEFT 2  Final   Special Requests PATIENT ON FOLLOWING CEFEPIME VANC ROCEPHIN  Final   Culture   Final    NO ANAEROBES ISOLATED Performed at [facility], [place] [address], [place], [place] [address]    Report Status 10/29/2018 FINAL  Final  Body fluid culture     Status: None   Collection Time: 10/24/18 12:21 PM  Result Value Ref Range Status   Specimen Description PLEURAL LEFT 2  Final   Special Requests PATIENT ON FOLLOWING CEFEPIME VANC ROCEPHIN  Final   Gram Stain NO WBC SEEN NO ORGANISMS SEEN   Final   Culture   Final    NO GROWTH 3 DAYS Performed at [facility], [place] [address], [place], [place] [address]    Report Status 10/28/2018 FINAL  Final  Fungus Culture With Stain     Status: None (Preliminary result)   Collection Time: 10/24/18 12:21 PM  Result Value Ref Range Status   Fungus Stain Final report  Final    Comment: (NOTE) Performed At: [facility] [place], [place] [id number] Rush Farmer MD [id number]    Fungus (Mycology) Culture PENDING  Incomplete   Fungal Source PLEURAL  Final    Comment: LEFT 2 Performed at [facility], [place] [address], [place], [place] [address]   Acid Fast Smear (AFB)     Status: None   Collection Time: 10/24/18 12:21 PM  Result Value Ref Range Status   AFB Specimen Processing Concentration  Final   Acid Fast Smear Negative  Final    Comment: (NOTE) Performed At: [facility] [place], [place] [id number] Rush Farmer MD [id number]    Source (AFB) PLEURAL  Final    Comment: LEFT 2 Performed at [facility], [place] [address] [place], [place] [address]   Fungus Culture Result     Status: None   Collection Time: 10/24/18 12:21 PM  Result Value Ref Range Status   Result 1 Comment  Final    Comment: (NOTE) KOH/Calcofluor preparation:  no fungus observed. Performed At: [facility] [place], [place] [id number] Rush Farmer MD [id number]   Fungus Culture With Stain     Status: None (Preliminary result)   Collection Time: 10/24/18 12:23 PM  Result Value Ref Range Status   Fungus Stain Final report  Final    Comment: (NOTE) Performed At: [facility] [place], [place] [id number] Rush Farmer MD [id number]    Fungus (Mycology) Culture PENDING  Incomplete   Fungal Source TISSUE  Final    Comment: PLEURAL LEFT Performed at [facility], [place] [address] [place], [place] [address]   Aerobic/Anaerobic Culture (surgical/deep wound)     Status: None   Collection Time: 10/24/18 12:23 PM  Result Value Ref Range Status   Specimen Description TISSUE PLEURAL LEFT  Final   Special Requests PATIENT ON FOLLOWING CEFEPIME ROCEPHIN VANC  Final   Gram Stain   Final    RARE WBC PRESENT, PREDOMINANTLY PMN RARE GRAM NEGATIVE RODS    Culture   Final    RARE STREPTOCOCCUS INTERMEDIUS NO ANAEROBES ISOLATED Performed at [facility], [place] [address], [place], [place] [address]    Report Status 10/29/2018 FINAL  Final   Organism ID, Bacteria STREPTOCOCCUS INTERMEDIUS  Final  Susceptibility   Streptococcus intermedius - MIC*    ERYTHROMYCIN >=8 RESISTANT Resistant     LEVOFLOXACIN 0.5 SENSITIVE Sensitive     VANCOMYCIN 0.5 SENSITIVE Sensitive     * RARE STREPTOCOCCUS INTERMEDIUS  Acid Fast Smear (AFB)     Status: None   Collection Time: 10/24/18 12:23 PM  Result Value Ref Range Status   AFB Specimen Processing Comment  Final    Comment: Tissue Grinding and Digestion/Decontamination   Acid Fast Smear Negative  Final    Comment: (NOTE) Performed At: [facility] [place], [place] [id number] Rush Farmer MD [id number]    Source (AFB) TISSUE  Final    Comment: PLEURAL LEFT Performed at [facility], [place] [address] [place], [place] [address]   Fungus Culture Result     Status: None   Collection Time: 10/24/18 12:23 PM  Result Value Ref Range Status   Result 1 Comment  Final    Comment: (NOTE) KOH/Calcofluor preparation:  no fungus observed. Performed At: [facility] [place], [place] [id number] Rush Farmer MD [id number]     Radiology Reports Dg Chest 1 View  Result Date: 10/23/2018 CLINICAL DATA:  Shortness of Breath EXAM: CHEST  1 VIEW COMPARISON:  October 22, 2018 FINDINGS: There is extensive airspace consolidation throughout virtually all of the left lung. The right lung is clear. There is cardiomegaly. The pulmonary vascularity on the right appears normal. Pulmonary vascular on the left is obscured. There is no adenopathy in areas that can be assessed for potential adenopathy. IMPRESSION: Diffuse opacity throughout the left lung, likely due to a combination of consolidation and effusion. There may be slightly more consolidation on the left compared to 1 day prior. Right lung is clear.  Cardiac enlargement is stable. Electronically Signed   By: Lowella Grip III M.D.   On: 10/23/2018 07:54   Dg Chest 1 View  Result Date: 10/22/2018 CLINICAL DATA:  Status post left thoracentesis  EXAM: CHEST  1 VIEW COMPARISON:  10/22/2018 FINDINGS: Large left pleural effusion is again identified. There continues to only be a small aerated segment of the left lung apex, similar or mildly increased in size compared to prior. No appreciable pneumothorax. Cardiomegaly is present. The right lung appears clear. IMPRESSION: 1. Large left pleural effusion is again observed. No pneumothorax. Small amount of aerated lung at the left lung apex. This aerated lung may be slightly more than on the 10/22/2018 exam at 1310 hours. 2. Cardiomegaly. Electronically Signed   By: Van Clines M.D.   On: 10/22/2018 16:21   Dg Chest 2 View  Result Date: 10/31/2018 CLINICAL DATA:  Shortness of breath, recent evacuation of empyema and decortication on the left. Recent removal of all drainage tubes. EXAM: CHEST - 2 VIEW COMPARISON:  PA and lateral chest x-ray of October 28, 2018 FINDINGS: There is persistent increased density in the left mid and lower lung with obscuration of the left hemidiaphragm and portions of the left heart border. The right lung is adequately inflated and clear. The interstitial markings of the aerated portions of both lungs remain increased. The bony thorax exhibits no acute abnormality. IMPRESSION: Increased density in the lower 1/2 of the left lung since the earlier study worrisome for worsening atelectasis or pneumonia. Persistent pleural fluid versus pleural thickening along the left lateral pleural surface. No mediastinal shift. Stable appearing right lung. Electronically Signed   By: David  Martinique M.D.   On: 10/31/2018 10:31   Dg  Chest 2 View  Result Date: 10/28/2018 CLINICAL DATA:  Pleural effusion EXAM: CHEST - 2 VIEW COMPARISON:  Radiograph 10/27/2018 FINDINGS: Stable enlarged cardiac silhouette. Interval removal of LEFT chest tube. No pneumothorax appreciated. Dense LEFT basilar atelectasis remains. Effusion seen along the lateral LEFT chest wall. RIGHT lung clear. IMPRESSION: Rule LEFT  chest tube without pneumothorax appreciated Dense LEFT basilar atelectasis and effusion. Electronically Signed   By: Suzy Bouchard M.D.   On: 10/28/2018 08:25   Dg Chest 2 View  Result Date: 10/22/2018 CLINICAL DATA:  Dyspnea. EXAM: CHEST - 2 VIEW COMPARISON:  Radiographs of August 19, 2018. FINDINGS: Interval development of large left pleural effusion is noted with underlying atelectasis or infiltrate. No significant mediastinal shift is noted. Stable cardiomediastinal silhouette. No pneumothorax is noted. Right lung is clear. Bony thorax is unremarkable. IMPRESSION: Interval development of large left pleural effusion with underlying atelectasis or infiltrate. No significant mediastinal shift is noted. Electronically Signed   By: Marijo Conception, M.D.   On: 10/22/2018 14:25   Dg Chest 2 View  Result Date: 10/19/2018 CLINICAL DATA:  Left-sided back pain and shortness of breath EXAM: CHEST - 2 VIEW COMPARISON:  01/30/2014 FINDINGS: Hazy left lower lobe airspace disease concerning for pneumonia. No pleural effusion or pneumothorax. Stable cardiomediastinal silhouette. No acute osseous abnormality. IMPRESSION: Left lower lobe pneumonia. Followup PA and lateral chest X-ray is recommended in 3-4 weeks following trial of antibiotic therapy to ensure resolution and exclude underlying malignancy. Electronically Signed   By: Kathreen Devoid   On: 10/19/2018 08:47   Ct Chest Wo Contrast  Result Date: 10/23/2018 CLINICAL DATA:  Dyspnea. EXAM: CT CHEST WITHOUT CONTRAST TECHNIQUE: Multidetector CT imaging of the chest was performed following the standard protocol without IV contrast. COMPARISON:  Radiograph of October 23, 2018. CT scan of October 19, 2018. FINDINGS: Cardiovascular: No evidence of thoracic aortic aneurysm. Mild cardiomegaly is noted. No pericardial effusion is noted. Mediastinum/Nodes: No enlarged mediastinal or axillary lymph nodes. Thyroid gland, trachea, and esophagus demonstrate no significant  findings. Lungs/Pleura: No pneumothorax is noted. Mild right basilar subsegmental atelectasis is noted. Left pleural effusion is significantly larger and appears to be loculated, with atelectasis of the left lower lobe and pneumonia or atelectasis of the left upper lobe. Upper Abdomen: Stable probable hepatic cysts are noted. Musculoskeletal: No chest wall mass or suspicious bone lesions identified. IMPRESSION: Left pleural effusion is significantly enlarged compared to prior exam and now appears to be loculated, with associated atelectasis or pneumonia involving the left upper and lower lobes. This is concerning for possible empyema. Electronically Signed   By: Marijo Conception, M.D.   On: 10/23/2018 11:41   Ct Angio Chest Pe W And/or Wo Contrast  Result Date: 10/19/2018 CLINICAL DATA:  62 year old female with shortness of breath and left scapular pain worse with inspiration. D-dimer is elevated. Evaluate for pulmonary embolus. EXAM: CT ANGIOGRAPHY CHEST WITH CONTRAST TECHNIQUE: Multidetector CT imaging of the chest was performed using the standard protocol during bolus administration of intravenous contrast. Multiplanar CT image reconstructions and MIPs were obtained to evaluate the vascular anatomy. CONTRAST:  47mL ISOVUE-370 IOPAMIDOL (ISOVUE-370) INJECTION 76% COMPARISON:  Chest x-ray obtained earlier today; prior CT scan of the abdomen and pelvis including lung bases 05/06/2018 FINDINGS: Cardiovascular: Except below opacifications of the pulmonary arteries to the proximal segmental level. The main pulmonary artery is mildly enlarged at 3.2 cm. No evidence of central pulmonary embolus. Mild cardiomegaly with right atrial enlargement. Normal caliber aorta. No pericardial  effusion. Normal pulmonary venous drainage pattern. Mediastinum/Nodes: Unremarkable CT appearance of the thyroid gland. No suspicious mediastinal or hilar adenopathy. No soft tissue mediastinal mass. The thoracic esophagus is unremarkable.  Lungs/Pleura: Small layering left pleural effusion without evidence of loculation or enhancing pleural rind. While there is some associated left lower lobe atelectasis, the degree of atelectasis the is greater than expected and there is also some patchy airspace opacity in a peribronchovascular distribution more centrally within the left lower lobe. Overall, these findings suggest left lower lobe bronchopneumonia with associated parapneumonic effusion. Minimal dependent atelectasis also present in the right lower lobe. No emphysematous changes. No suspicious pulmonary nodule or mass. Upper Abdomen: Circumscribed water attenuation lesions in the liver most consistent with simple hepatic cysts. Similar findings were evident on prior CT imaging of the abdomen and pelvis. No acute abnormality within the visualized upper abdomen. Musculoskeletal: No acute fracture or aggressive appearing lytic or blastic osseous lesion. Review of the MIP images confirms the above findings. IMPRESSION: 1. Left lower lobe broncho pneumonia with small likely parapneumonic effusion. No CT evidence to suggest empyema. 2. No evidence of acute pulmonary embolus to the lobar level. 3. Enlarged main pulmonary artery and right atrial dilatation suggesting underlying pulmonary arterial hypertension with elevated right heart pressures. Electronically Signed   By: Jacqulynn Cadet M.D.   On: 10/19/2018 09:50   Dg Chest Port 1 View  Result Date: 10/27/2018 CLINICAL DATA:  Chest tube. EXAM: PORTABLE CHEST 1 VIEW COMPARISON:  One-view chest x-ray 10/26/2018 FINDINGS: The heart is enlarged. One of the left-sided chest tube was removed. Left pleural effusion remains. There is no pneumothorax. Right IJ line is stable. Left basilar airspace disease is unchanged. Mild atelectasis at the right base is slightly improved. IMPRESSION: 1. Interval removal of 1 left-sided chest tube without pneumothorax. 2. Stable left pleural effusion and basilar airspace  disease. While this likely reflects atelectasis, infection is not excluded. 3. Slight improved atelectasis at the right base. Electronically Signed   By: San Morelle M.D.   On: 10/27/2018 09:27   Dg Chest Port 1 View  Result Date: 10/26/2018 CLINICAL DATA:  Chest tube.  Sore chest. EXAM: PORTABLE CHEST 1 VIEW COMPARISON:  10/25/2018. FINDINGS: Right IJ line and left chest tubes in stable position. No pneumothorax. Stable cardiomegaly. Unchanged bibasilar atelectasis. Unchanged left-sided pleural thickening. IMPRESSION: 1. Right IJ line and left chest tubes in stable position. No pneumothorax. 2.  Stable bibasilar atelectasis and left-sided pleural effusion. 3.  Stable cardiomegaly. Electronically Signed   By: Marcello Moores  Register   On: 10/26/2018 08:51   Dg Chest Port 1 View  Result Date: 10/25/2018 CLINICAL DATA:  Status post VATS on the left EXAM: PORTABLE CHEST 1 VIEW COMPARISON:  10/24/2018 FINDINGS: Cardiac Leon remains enlarged. Endotracheal tube, nasogastric catheter and right jugular central line are again seen and stable. Two chest tubes are noted on the left without evidence of pneumothorax. Residual left pleural effusion is again identified and stable. Persistent increased left retrocardiac density is noted. The right lung remains clear. IMPRESSION: Stable appearance of left empyema and lower lobe consolidation. Tubes and lines as described. Electronically Signed   By: Inez Catalina M.D.   On: 10/25/2018 07:55   Dg Chest Port 1 View  Result Date: 10/24/2018 CLINICAL DATA:  Endotracheal tube placement. EXAM: PORTABLE CHEST 1 VIEW COMPARISON:  Chest radiograph and CT of the chest performed 10/23/2018 FINDINGS: The patient's endotracheal tube is seen ending 5-6 cm above the carina. A right IJ line  is noted ending about the proximal to mid SVC. A persistent large loculated left-sided pleural effusion is again noted, with underlying airspace consolidation, concerning for pneumonia and  empyema, as noted on the prior CT. The right lung appears relatively clear. No pneumothorax is seen The cardiomediastinal silhouette is mildly enlarged. No acute osseous abnormalities are identified. IMPRESSION: 1. Endotracheal tube seen ending 5-6 cm above the carina. 2. Persistent large loculated left-sided pleural effusion, with underlying airspace consolidation, concerning for pneumonia and empyema, as noted on the prior CT. 3. Mild cardiomegaly. Electronically Signed   By: Garald Balding M.D.   On: 10/24/2018 15:30   Dg Abd Portable 1v  Result Date: 10/25/2018 CLINICAL DATA:  62 year old female with advancement of enteric tube. EXAM: PORTABLE ABDOMEN - 1 VIEW COMPARISON:  Area radiograph dated 10/25/2018 FINDINGS: Interval advancement of enteric tube with side port and tip in the body of the stomach. Air is noted in the colon. Right upper quadrant cholecystectomy clips. Partially visualized left lung base opacity. IMPRESSION: Enteric tube with side port and tip in the body of the stomach. Electronically Signed   By: Anner Crete M.D.   On: 10/25/2018 02:11   Dg Abd Portable 1v  Result Date: 10/25/2018 CLINICAL DATA:  62 year old female status post NG tube placement. EXAM: PORTABLE ABDOMEN - 1 VIEW COMPARISON:  Chest radiograph dated 10/24/2018 FINDINGS: Partially visualized enteric tube with side port in the region of the gastroesophageal junction and tip in the proximal stomach. Recommend further advancing the tube into the stomach by an additional 14 cm. Air is noted within the colon. Left lung base opacity as seen on the earlier radiograph. IMPRESSION: Enteric tube with side port at the GE junction. Recommend further advancing of the tube into the stomach. Electronically Signed   By: Anner Crete M.D.   On: 10/25/2018 00:56   Vas [place] Lower Extremity Venous (dvt)  Result Date: 10/21/2018  Lower Venous Study Performing Technologist: BYNUM, [place], C  Examination Guidelines: A complete  evaluation includes B-mode imaging, spectral Doppler, color Doppler, and power Doppler as needed of all accessible portions of each vessel. Bilateral testing is considered an integral part of a complete examination. Limited examinations for reoccurring indications may be performed as noted.  Right Venous Findings: +---------+---------------+---------+-----------+----------+------------------+          CompressibilityPhasicitySpontaneityPropertiesSummary            +---------+---------------+---------+-----------+----------+------------------+ CFV      Full           Yes      Yes                                     +---------+---------------+---------+-----------+----------+------------------+ SFJ      Full                                                            +---------+---------------+---------+-----------+----------+------------------+ FV Prox  Full                                                            +---------+---------------+---------+-----------+----------+------------------+  FV Mid   Full                                                            +---------+---------------+---------+-----------+----------+------------------+ FV DistalFull                                         poor visualization +---------+---------------+---------+-----------+----------+------------------+ PFV      Full                                                            +---------+---------------+---------+-----------+----------+------------------+ POP      Full           Yes      Yes                                     +---------+---------------+---------+-----------+----------+------------------+ PTV      Full                                         limited                                                                  visualization      +---------+---------------+---------+-----------+----------+------------------+ PERO     Full                                          limited                                                                  visualization      +---------+---------------+---------+-----------+----------+------------------+ GSV      Full                                                            +---------+---------------+---------+-----------+----------+------------------+  Left Venous Findings: +---------+---------------+---------+-----------+----------+------------------+          CompressibilityPhasicitySpontaneityPropertiesSummary            +---------+---------------+---------+-----------+----------+------------------+ CFV      Full           Yes      Yes                                     +---------+---------------+---------+-----------+----------+------------------+  SFJ      Full                                                            +---------+---------------+---------+-----------+----------+------------------+ FV Prox  Full                                                            +---------+---------------+---------+-----------+----------+------------------+ FV Mid   Full                                                            +---------+---------------+---------+-----------+----------+------------------+ FV DistalFull                                         poor visualization +---------+---------------+---------+-----------+----------+------------------+ PFV      Full                                                            +---------+---------------+---------+-----------+----------+------------------+ POP      Full           Yes      Yes                                     +---------+---------------+---------+-----------+----------+------------------+ PTV      Full                                         limited                                                                  visualization       +---------+---------------+---------+-----------+----------+------------------+ PERO     Full                                         limited  visualization      +---------+---------------+---------+-----------+----------+------------------+ GSV      Full                                                            +---------+---------------+---------+-----------+----------+------------------+    Summary: Right: There is no evidence of deep vein thrombosis in the lower extremity. However, portions of this examination were limited- see technologist comments above. No cystic structure found in the popliteal fossa. somewhat difficult evaluation due to patient body habitus and penetration. Left: There is no evidence of deep vein thrombosis in the lower extremity. However, portions of this examination were limited- see technologist comments above. No cystic structure found in the popliteal fossa. somewhat difficult evaluation due to patient  body habitus and penetration.  *See table(s) above for measurements and observations. Electronically signed by Monica Martinez MD on 10/21/2018 at 3:57:20 PM.    Final    US Thoracentesis Asp Pleural Space W/img Guide  Result Date: 10/22/2018 INDICATION: 62 year old female with symptomatic left pleural effusion. EXAM: ULTRASOUND GUIDED LEFT THORACENTESIS MEDICATIONS: None. COMPLICATIONS: None immediate. PROCEDURE: An ultrasound guided thoracentesis was thoroughly discussed with the patient and questions answered. The benefits, risks, alternatives and complications were also discussed. The patient understands and wishes to proceed with the procedure. Written consent was obtained. Ultrasound was performed to localize and mark an adequate pocket of fluid in the left chest. The area was then prepped and draped in the normal sterile fashion. 1% Lidocaine was used for local anesthesia. Under  ultrasound guidance a 6 Fr Safe-T-Centesis catheter was introduced. Thoracentesis was performed. The catheter was removed and a dressing applied. FINDINGS: A total of approximately 970 mL of yellow pleural fluid was removed. Samples were sent to the laboratory as requested by the clinical team. IMPRESSION: Successful ultrasound guided left thoracentesis yielding 970 mL of pleural fluid. Electronically Signed   By: Jacqulynn Cadet M.D.   On: 10/22/2018 16:31    Lab Data:  CBC: Recent Labs  Lab 10/26/18 0433 10/27/18 0535 10/29/18 0253 10/30/18 0318 10/31/18 0332  WBC 15.8* 13.5* 16.1* 15.0* 13.0*  HGB 8.7* 9.2* 10.7* 9.0* 8.9*  HCT 29.8* 30.9* 36.3 29.3* 29.0*  MCV 87.9 87.0 86.6 85.4 85.5  PLT 454* 451* 530* 442* 119*   Basic Metabolic Panel: Recent Labs  Lab 10/25/18 0325 10/26/18 0433 10/27/18 0535 10/30/18 0318  NA 139 141 142 140  K 3.8 3.6 4.0 3.4*  CL 107 109 109 103  CO2 26 28 28 30   GLUCOSE 187* 93 103* 97  BUN 8 12 7* <5*  CREATININE 0.76 0.84 0.79 0.73  CALCIUM 8.3* 8.2* 8.4* 8.2*  MG 2.4 2.3  --   --   PHOS 3.4 2.8  --   --    GFR: Estimated Creatinine Clearance: 113 mL/min (by C-G formula based on SCr of 0.73 mg/dL). Liver Function Tests: Recent Labs  Lab 10/25/18 0325 10/26/18 0433  AST  --  20  ALT  --  25  ALKPHOS  --  90  BILITOT  --  0.5  PROT  --  5.3*  ALBUMIN 1.6* 1.6*   No results for input(s): LIPASE, AMYLASE in the last 168 hours. No results for input(s): AMMONIA in the last 168 hours. Coagulation Profile: No results for input(s): INR, PROTIME in the last  168 hours. Cardiac Enzymes: No results for input(s): CKTOTAL, CKMB, CKMBINDEX, TROPONINI in the last 168 hours. BNP (last 3 results) No results for input(s): PROBNP in the last 8760 hours. HbA1C: No results for input(s): HGBA1C in the last 72 hours. CBG: Recent Labs  Lab 10/30/18 1108 10/30/18 1614 10/30/18 2124 10/31/18 0633 10/31/18 1219  GLUCAP 110* 117* 103* 106* 98    Lipid Profile: No results for input(s): CHOL, HDL, LDLCALC, TRIG, CHOLHDL, LDLDIRECT in the last 72 hours. Thyroid Function Tests: No results for input(s): TSH, T4TOTAL, FREET4, T3FREE, THYROIDAB in the last 72 hours. Anemia Panel: No results for input(s): VITAMINB12, FOLATE, FERRITIN, TIBC, Leon, RETICCTPCT in the last 72 hours. Urine analysis:    Component Value Date/Time   COLORURINE YELLOW 05/06/2018 [place] 05/06/2018 1517   LABSPEC >1.030 (H) 05/06/2018 1517   PHURINE 5.5 05/06/2018 1517   GLUCOSEU NEGATIVE 05/06/2018 1517   HGBUR TRACE (A) 05/06/2018 1517   BILIRUBINUR NEGATIVE 05/06/2018 1517   BILIRUBINUR Negative 12/13/2017 [place] 05/06/2018 1517   PROTEINUR NEGATIVE 05/06/2018 1517   UROBILINOGEN 0.2 12/13/2017 0851   NITRITE NEGATIVE 05/06/2018 1517   LEUKOCYTESUR NEGATIVE 05/06/2018 1517     Dameisha Tschida M.D. [facility] 10/31/2018, 1:03 PM  Pager: [phone] Between 7am to 7pm - call Pager - [phone]  After 7pm go to [email or web address] - password [email or web address]  Call night coverage person covering after 7pm

## 2018-10-31 NOTE — Progress Notes (Signed)
      [address]       [place] [address]             [phone]      7 Days Post-Op Procedure(s) (LRB): VIDEO ASSISTED THORACOSCOPY (VATS)/THOROCOTOMY with EVACUATION OF EMPYEMA AND DECORTICATION. (Left) EMPYEMA DRAINAGE (Left) VIDEO BRONCHOSCOPY (N/A)   Subjective:  No new complaints  Objective: Vital signs in last 24 hours: Temp:  [99 F (37.2 C)] 99 F (37.2 C) (11/06 0636) Pulse Rate:  [58-113] 58 (11/06 0636) Cardiac Rhythm: Normal sinus rhythm (11/05 2031) Resp:  [20-30] 26 (11/06 0636) BP: (112-128)/(58-64) 112/58 (11/06 0636) SpO2:  [90 %-98 %] 96 % (11/06 0636)  Intake/Output from previous day: 11/05 0701 - 11/06 0700 In: 660 [P.O.:360; IV Piggyback:300] Out: 2150 [Urine:2150]  General appearance: alert, cooperative and morbidly obese Heart: regular rate and rhythm Lungs: diminished breath sounds bibasilar Abdomen: soft, non-tender; bowel sounds normal; no masses,  no organomegaly Wound: stable, steri strips remain in place  Lab Results: Recent Labs    10/30/18 0318 10/31/18 0332  WBC 15.0* 13.0*  HGB 9.0* 8.9*  HCT 29.3* 29.0*  PLT 442* 447*   BMET:  Recent Labs    10/30/18 0318  NA 140  K 3.4*  CL 103  CO2 30  GLUCOSE 97  BUN <5*  CREATININE 0.73  CALCIUM 8.2*    PT/INR: No results for input(s): LABPROT, INR in the last 72 hours. ABG    Component Value Date/Time   PHART 7.407 10/25/2018 0340   HCO3 26.5 10/25/2018 0340   TCO2 27 10/24/2018 1615   O2SAT 95.9 10/25/2018 0340   CBG (last 3)  Recent Labs    10/30/18 1108 10/30/18 1614 10/30/18 2124  GLUCAP 110* 117* 103*    Assessment/Plan: S/P Procedure(s) (LRB): VIDEO ASSISTED THORACOSCOPY (VATS)/THOROCOTOMY with EVACUATION OF EMPYEMA AND DECORTICATION. (Left) EMPYEMA DRAINAGE (Left) VIDEO BRONCHOSCOPY (N/A)  1. ID- Empyema, + Strep Intermedius, ABX per primary, remains febrile at times, leukocytosis stable 2. Wound- looks stable, plan for wound check  next week in the office 3. Dispo- care per primary   LOS: 11 days    Erin Barrett 10/31/2018

## 2018-10-31 NOTE — Progress Notes (Signed)
Nurse Tech walked with patient in hallway. She stated that Patient O2 sat was 75 percent on 2L of O2 while ambulating. She said when patient got back to the room and sat down, her saturation was 97 percent on 2L of O2.

## 2018-10-31 NOTE — Plan of Care (Signed)
?  Problem: Clinical Measurements: ?Goal: Ability to maintain clinical measurements within normal limits will improve ?Outcome: Progressing ?Goal: Will remain free from infection ?Outcome: Progressing ?Goal: Diagnostic test results will improve ?Outcome: Progressing ?  ?

## 2018-11-01 DIAGNOSIS — Z9689 Presence of other specified functional implants: Secondary | ICD-10-CM

## 2018-11-01 DIAGNOSIS — J189 Pneumonia, unspecified organism: Secondary | ICD-10-CM

## 2018-11-01 DIAGNOSIS — J4520 Mild intermittent asthma, uncomplicated: Secondary | ICD-10-CM

## 2018-11-01 LAB — GLUCOSE, CAPILLARY
GLUCOSE-CAPILLARY: 125 mg/dL — AB (ref 70–99)
Glucose-Capillary: 101 mg/dL — ABNORMAL HIGH (ref 70–99)

## 2018-11-01 MED ORDER — DOXYCYCLINE HYCLATE 100 MG PO TABS: 100.0000 mg | ORAL_TABLET | Freq: Two times a day (BID) | ORAL | 0 refills | Status: AC

## 2018-11-01 MED ORDER — IPRATROPIUM-ALBUTEROL 0.5-2.5 (3) MG/3ML IN SOLN: 3.0000 mL | Freq: Two times a day (BID) | RESPIRATORY_TRACT | Status: DC

## 2018-11-01 MED ORDER — BENZONATATE 200 MG PO CAPS: 200.0000 mg | ORAL_CAPSULE | Freq: Three times a day (TID) | ORAL | 0 refills | Status: DC | PRN

## 2018-11-01 MED ORDER — METRONIDAZOLE 500 MG PO TABS: 500.0000 mg | ORAL_TABLET | Freq: Three times a day (TID) | ORAL | 0 refills | Status: AC

## 2018-11-01 MED ORDER — GUAIFENESIN-DM 100-10 MG/5ML PO SYRP: 5.0000 mL | ORAL_SOLUTION | ORAL | 0 refills | Status: DC | PRN

## 2018-11-01 MED FILL — DOXYCYCLINE HYCLATE 100 MG: 100 | 19 days supply | Qty: 38 | Fill #0

## 2018-11-01 MED FILL — metroNIDAZOLE 500 MG TABS: 500 | 19 days supply | Qty: 57 | Fill #0

## 2018-11-01 MED FILL — BENZONATATE 200 MG CAPS: 200 | 10 days supply | Qty: 30 | Fill #0

## 2018-11-01 NOTE — Progress Notes (Signed)
Occupational Therapy Treatment Patient Details Name: Taylor Leon MRN: [id number] DOB: 1956/05/16 Today's Date: 11/01/2018    History of present illness Pt is a 62 y.o. F with significant PMH of morbid obesity, asthma, and DM2 who was admitted 10/25 with CAP and small parapneumonic effusion. Left thoracentesis on 10/28. Underwent VATS on 10/30. Unable to be extubated immediately post operatively and extubated 10/31.    OT comments  Pt progressing towards acute OT goals. Focus of session was energy conservation strategies and fall prevention. D/c plan remains appropriate.    Follow Up Recommendations  Home health OT;Supervision - Intermittent    Equipment Recommendations  3 in 1 bedside commode    Recommendations for Other Services      Precautions / Restrictions Precautions Precautions: Fall Precaution Comments: watch O2 Restrictions Weight Bearing Restrictions: No       Mobility Bed Mobility               General bed mobility comments: up in chair  Transfers Overall transfer level: Needs assistance Equipment used: None Transfers: Sit to/from Stand Sit to Stand: Min guard         General transfer comment: min guard     Balance Overall balance assessment: Needs assistance Sitting-balance support: Feet supported;Bilateral upper extremity supported Sitting balance-Leahy Scale: Good     Standing balance support: No upper extremity supported;During functional activity Standing balance-Leahy Scale: Fair Standing balance comment: Heavily reliant on UE support with static standing noted. wide BOS.                           ADL either performed or assessed with clinical judgement   ADL Overall ADL's : Needs assistance/impaired     Grooming: Wash/dry hands;Min guard;Standing Grooming Details (indicate cue type and reason): relies on sink for external support                 Toilet Transfer: Min guard;Ambulation Toilet Transfer Details  (indicate cue type and reason): Pt heaviliy relaiant on UE support however continues to declined RW. Toileting- Clothing Manipulation and Hygiene: Minimal assistance;Sit to/from stand Toileting - Clothing Manipulation Details (indicate cue type and reason): pt using door frames to stabilize self to complete pericare. Min A to steady at times     Functional mobility during ADLs: Minimal assistance General ADL Comments: Pt completed ambulation to/from bathroom, toilet transfer, pericare, and grooming tasks in standing position.     Vision       Perception     Praxis      Cognition Arousal/Alertness: Awake/alert Behavior During Therapy: WFL for tasks assessed/performed Overall Cognitive Status: Within Functional Limits for tasks assessed                                 General Comments: Requiring increased encouragement to participate. Difficult to engage        Exercises     Shoulder Instructions       General Comments SOB after toileting tasks. Provided ec education at end of session.    Pertinent Vitals/ Pain       Pain Assessment: Faces Faces Pain Scale: Hurts a little bit Pain Location: chronic knee pain Pain Descriptors / Indicators: Aching Pain Intervention(s): Monitored during session  Home Living  Prior Functioning/Environment              Frequency  Min 2X/week        Progress Toward Goals  OT Goals(current goals can now be found in the care plan section)  Progress towards OT goals: Progressing toward goals  Acute Rehab OT Goals Patient Stated Goal: "do what I can to wean off oxygen." OT Goal Formulation: With patient Time For Goal Achievement: 11/14/18 Potential to Achieve Goals: Good ADL Goals Pt Will Perform Grooming: with modified independence;sitting;standing Pt Will Perform Lower Body Dressing: with supervision;with adaptive equipment;sitting/lateral leans;sit  to/from stand Pt Will Transfer to Toilet: with modified independence;ambulating;bedside commode Pt Will Perform Toileting - Clothing Manipulation and hygiene: with modified independence;sitting/lateral leans;sit to/from stand Pt Will Perform Tub/Shower Transfer: Shower transfer;with supervision;with caregiver independent in assisting;ambulating;rolling walker;3 in 1 Additional ADL Goal #1: Pt will I'ly implement energy conservation techniques during ADL's with no more than 1-2 VC's  Plan Discharge plan remains appropriate    Co-evaluation                 AM-PAC PT "6 Clicks" Daily Activity     Outcome Measure   Help from another person eating meals?: None Help from another person taking care of personal grooming?: A Little Help from another person toileting, which includes using toliet, bedpan, or urinal?: A Little Help from another person bathing (including washing, rinsing, drying)?: A Little Help from another person to put on and taking off regular upper body clothing?: A Little Help from another person to put on and taking off regular lower body clothing?: A Little 6 Click Score: 19    End of Session Equipment Utilized During Treatment: Oxygen  OT Visit Diagnosis: Muscle weakness (generalized) (M62.81)   Activity Tolerance Patient tolerated treatment well   Patient Left in chair;with call bell/phone within reach   Nurse Communication          Time: 1241-1300 OT Time Calculation (min): 19 min  Charges: OT General Charges $OT Visit: 1 Visit OT Treatments $Self Care/Home Management : 8-22 mins  Tyrone Schimke, OT Acute Rehabilitation Services Pager: [phone] Office: [phone]    Hortencia Pilar 11/01/2018, 2:16 PM

## 2018-11-01 NOTE — Discharge Summary (Signed)
Physician Discharge Summary   Patient ID: Taylor Leon MRN: [id number] DOB/AGE: Feb 10, 1956 62 y.o.  Admit date: 10/19/2018 Discharge date: 11/01/2018  Primary Care Physician:  Tammi Sou, MD   Recommendations for Outpatient Follow-up:  1. Follow up with PCP in 1-2 weeks 2. Please obtain BMP/CBC in one week 3. Patient qualifies for home O2 3 L  Home Health: Home health PT OT, RN, home health aide Equipment/Devices: DME rolling walker, DME home O2  Discharge Condition: stable CODE STATUS: FULL  Diet recommendation: Heart healthy diet   Discharge Diagnoses:   Acute hypoxic respiratory failure secondary to community-acquired pneumonia, empyema Status post VATS . CAP (community acquired pneumonia) . Asthmatic bronchitis . DJD (degenerative joint disease) of knee . Empyema of pleural space (HCC) Type 2 diabetes mellitus Severe protein calorie malnutrition  Consults Cardiothoracic surgery    Allergies:   Allergies  Allergen Reactions  . Penicillins Nausea Only and Rash    Has patient had a PCN reaction causing immediate rash, facial/tongue/throat swelling, SOB or lightheadedness with hypotension: yES Has patient had a PCN reaction causing severe rash involving mucus membranes or skin necrosis: No Has patient had a PCN reaction that required hospitalization: No Has patient had a PCN reaction occurring within the last 10 years: No If all of the above answers are "NO", then may proceed with Cephalosporin use. CC     DISCHARGE MEDICATIONS: Allergies as of 11/01/2018      Reactions   Penicillins Nausea Only, Rash   Has patient had a PCN reaction causing immediate rash, facial/tongue/throat swelling, SOB or lightheadedness with hypotension: yES Has patient had a PCN reaction causing severe rash involving mucus membranes or skin necrosis: No Has patient had a PCN reaction that required hospitalization: No Has patient had a PCN reaction occurring within the last 10  years: No If all of the above answers are "NO", then may proceed with Cephalosporin use. CC      Medication List    STOP taking these medications   azithromycin 250 MG tablet Commonly known as:  ZITHROMAX   predniSONE 20 MG tablet Commonly known as:  DELTASONE     TAKE these medications   albuterol (2.5 MG/3ML) 0.083% nebulizer solution Commonly known as:  PROVENTIL Take 3 mLs (2.5 mg total) by nebulization every 6 (six) hours as needed for wheezing.   albuterol 108 (90 Base) MCG/ACT inhaler Commonly known as:  PROVENTIL HFA;VENTOLIN HFA Inhale 2 puffs into the lungs every 4 (four) hours as needed for wheezing.   ascorbic acid 1000 MG tablet Commonly known as:  VITAMIN C Take 1,000 mg by mouth daily.   BAYER MICROLET LANCETS lancets Use to check blood sugar once daily   benzonatate 200 MG capsule Commonly known as:  TESSALON Take 1 capsule (200 mg total) by mouth 3 (three) times daily as needed for cough.   doxycycline 100 MG tablet Commonly known as:  VIBRA-TABS Take 1 tablet (100 mg total) by mouth 2 (two) times daily for 19 days.   glucose blood test strip Use to check blood sugar once daily   guaiFENesin-dextromethorphan 100-10 MG/5ML syrup Commonly known as:  ROBITUSSIN DM Take 5 mLs by mouth every 4 (four) hours as needed for cough.   meloxicam 7.5 MG tablet Commonly known as:  MOBIC 1-2 tabs po qd prn arthritis pain   metFORMIN 1000 MG tablet Commonly known as:  GLUCOPHAGE 1 tab po bid with meals   metroNIDAZOLE 500 MG tablet Commonly known as:  FLAGYL  Take 1 tablet (500 mg total) by mouth 3 (three) times daily for 19 days.   multivitamin capsule Take 1 capsule by mouth daily.   naproxen sodium 220 MG tablet Commonly known as:  ALEVE Take 220 mg by mouth daily as needed (arthritis pain.).   Turmeric Curcumin Caps Take 1 capsule by mouth 3 (three) times a week. Monday, Wednesday, and Friday.   VITAMIN D-3 PO Take 1 tablet by mouth daily.             Durable Medical Equipment  (From admission, onward)         Start     Ordered   11/01/18 1136  For home use only DME Walker rolling  Once    Comments:  5 inches wheels  Question:  Patient needs a walker to treat with the following condition  Answer:  Gait instability   11/01/18 1135   11/01/18 1135  For home use only DME oxygen  Once    Question Answer Comment  Mode or (Route) Nasal cannula   Liters per Minute 3   Frequency Continuous (stationary and portable oxygen unit needed)   Oxygen conserving device Yes   Oxygen delivery system Gas      11/01/18 1135           Brief H and P: For complete details please refer to admission H and P, but in brief 62yo F w/ a hx of morbid obesity, asthma, and DM2 who as admitted 10/19/2018 with CAP and a small parapneumonic effusion.  left thoracentesis on 10/28 noted WBC 690, LDH 985. Repeat CT chest showed progression of left effusion with loculation, with pneumonia in left upper and lower lobes, consistent with empyema. She was transferred to [facility] 10/30 and underwent a L VATS by Dr. Servando Snare 10/30. She was unable to be extubated immediately post operatively secondary to low tidal volumes and therefore transitioned to the ICU. She was successfully extubated on 10/25/2018.  Hospital Course:   Sepsis secondary to Streptococcus intermedius pneumonia complicated by empyema, left lung -Status post VATS 10/30, chest tubes have been removed -Patient was placed on vancomycin and cefazolin, vancomycin was discontinued.  CCM had recommended transitioning to oral Augmentin for total of 3 to 4 weeks however patient has penicillin allergy.  Placed on oral doxycycline and Flagyl for 19 days to complete full course of antibiotics for 4 weeks. -Patient will follow-up outpatient with cardiothoracic surgery and pulmonology  Active Problems:  Acute respiratory failure with hypoxia secondary to #1 -Home oxygen evaluation, likely also has  underlying OSA, recommend home sleep study as outpatient.  Outpatient follow-up with pulmonology scheduled. -Added Tessalon Perles    Asthmatic bronchitis -Currently stable, no wheezing    Obesity, Class III, BMI 40-49.9 (morbid obesity) ([place]) -Patient counseled on diet and weight control    Type 2 diabetes mellitus with hyperlipidemia ([place]) -CBGs currently controlled, continue metformin  Severe protein calorie malnutrition Continue nutritional supplements    Day of Discharge S: Feels better, overall improving,  BP 114/61 (BP Location: Left Arm)   Pulse 71   Temp 98.6 F (37 C) (Oral)   Resp 19   Ht 5\' 7"  (1.702 m)   Wt (!) 153.2 kg   SpO2 97%   BMI 52.89 kg/m   Physical Exam: General: Alert and awake oriented x3 not in any acute distress. HEENT: anicteric sclera, pupils reactive to light and accommodation CVS: S1-S2 clear no murmur rubs or gallops Chest: Decreased breath sound at the bases Abdomen:  soft nontender, nondistended, normal bowel sounds Extremities: no cyanosis, clubbing or edema noted bilaterally Neuro: Cranial nerves II-XII intact, no focal neurological deficits   The results of significant diagnostics from this hospitalization (including imaging, microbiology, ancillary and laboratory) are listed below for reference.      Procedures/Studies:  Dg Chest 1 View  Result Date: 10/23/2018 CLINICAL DATA:  Shortness of Breath EXAM: CHEST  1 VIEW COMPARISON:  October 22, 2018 FINDINGS: There is extensive airspace consolidation throughout virtually all of the left lung. The right lung is clear. There is cardiomegaly. The pulmonary vascularity on the right appears normal. Pulmonary vascular on the left is obscured. There is no adenopathy in areas that can be assessed for potential adenopathy. IMPRESSION: Diffuse opacity throughout the left lung, likely due to a combination of consolidation and effusion. There may be slightly more consolidation on the left  compared to 1 day prior. Right lung is clear.  Cardiac enlargement is stable. Electronically Signed   By: Lowella Grip III M.D.   On: 10/23/2018 07:54   Dg Chest 1 View  Result Date: 10/22/2018 CLINICAL DATA:  Status post left thoracentesis EXAM: CHEST  1 VIEW COMPARISON:  10/22/2018 FINDINGS: Large left pleural effusion is again identified. There continues to only be a small aerated segment of the left lung apex, similar or mildly increased in size compared to prior. No appreciable pneumothorax. Cardiomegaly is present. The right lung appears clear. IMPRESSION: 1. Large left pleural effusion is again observed. No pneumothorax. Small amount of aerated lung at the left lung apex. This aerated lung may be slightly more than on the 10/22/2018 exam at 1310 hours. 2. Cardiomegaly. Electronically Signed   By: Van Clines M.D.   On: 10/22/2018 16:21   Dg Chest 2 View  Result Date: 10/31/2018 CLINICAL DATA:  Shortness of breath, recent evacuation of empyema and decortication on the left. Recent removal of all drainage tubes. EXAM: CHEST - 2 VIEW COMPARISON:  PA and lateral chest x-ray of October 28, 2018 FINDINGS: There is persistent increased density in the left mid and lower lung with obscuration of the left hemidiaphragm and portions of the left heart border. The right lung is adequately inflated and clear. The interstitial markings of the aerated portions of both lungs remain increased. The bony thorax exhibits no acute abnormality. IMPRESSION: Increased density in the lower 1/2 of the left lung since the earlier study worrisome for worsening atelectasis or pneumonia. Persistent pleural fluid versus pleural thickening along the left lateral pleural surface. No mediastinal shift. Stable appearing right lung. Electronically Signed   By: David  Martinique M.D.   On: 10/31/2018 10:31   Dg Chest 2 View  Result Date: 10/28/2018 CLINICAL DATA:  Pleural effusion EXAM: CHEST - 2 VIEW COMPARISON:  Radiograph  10/27/2018 FINDINGS: Stable enlarged cardiac silhouette. Interval removal of LEFT chest tube. No pneumothorax appreciated. Dense LEFT basilar atelectasis remains. Effusion seen along the lateral LEFT chest wall. RIGHT lung clear. IMPRESSION: Rule LEFT chest tube without pneumothorax appreciated Dense LEFT basilar atelectasis and effusion. Electronically Signed   By: Suzy Bouchard M.D.   On: 10/28/2018 08:25   Dg Chest 2 View  Result Date: 10/22/2018 CLINICAL DATA:  Dyspnea. EXAM: CHEST - 2 VIEW COMPARISON:  Radiographs of August 19, 2018. FINDINGS: Interval development of large left pleural effusion is noted with underlying atelectasis or infiltrate. No significant mediastinal shift is noted. Stable cardiomediastinal silhouette. No pneumothorax is noted. Right lung is clear. Bony thorax is unremarkable. IMPRESSION: Interval development  of large left pleural effusion with underlying atelectasis or infiltrate. No significant mediastinal shift is noted. Electronically Signed   By: Marijo Conception, M.D.   On: 10/22/2018 14:25   Dg Chest 2 View  Result Date: 10/19/2018 CLINICAL DATA:  Left-sided back pain and shortness of breath EXAM: CHEST - 2 VIEW COMPARISON:  01/30/2014 FINDINGS: Hazy left lower lobe airspace disease concerning for pneumonia. No pleural effusion or pneumothorax. Stable cardiomediastinal silhouette. No acute osseous abnormality. IMPRESSION: Left lower lobe pneumonia. Followup PA and lateral chest X-ray is recommended in 3-4 weeks following trial of antibiotic therapy to ensure resolution and exclude underlying malignancy. Electronically Signed   By: Kathreen Devoid   On: 10/19/2018 08:47   Ct Chest Wo Contrast  Result Date: 10/23/2018 CLINICAL DATA:  Dyspnea. EXAM: CT CHEST WITHOUT CONTRAST TECHNIQUE: Multidetector CT imaging of the chest was performed following the standard protocol without IV contrast. COMPARISON:  Radiograph of October 23, 2018. CT scan of October 19, 2018. FINDINGS:  Cardiovascular: No evidence of thoracic aortic aneurysm. Mild cardiomegaly is noted. No pericardial effusion is noted. Mediastinum/Nodes: No enlarged mediastinal or axillary lymph nodes. Thyroid gland, trachea, and esophagus demonstrate no significant findings. Lungs/Pleura: No pneumothorax is noted. Mild right basilar subsegmental atelectasis is noted. Left pleural effusion is significantly larger and appears to be loculated, with atelectasis of the left lower lobe and pneumonia or atelectasis of the left upper lobe. Upper Abdomen: Stable probable hepatic cysts are noted. Musculoskeletal: No chest wall mass or suspicious bone lesions identified. IMPRESSION: Left pleural effusion is significantly enlarged compared to prior exam and now appears to be loculated, with associated atelectasis or pneumonia involving the left upper and lower lobes. This is concerning for possible empyema. Electronically Signed   By: Marijo Conception, M.D.   On: 10/23/2018 11:41   Ct Angio Chest Pe W And/or Wo Contrast  Result Date: 10/19/2018 CLINICAL DATA:  62 year old female with shortness of breath and left scapular pain worse with inspiration. D-dimer is elevated. Evaluate for pulmonary embolus. EXAM: CT ANGIOGRAPHY CHEST WITH CONTRAST TECHNIQUE: Multidetector CT imaging of the chest was performed using the standard protocol during bolus administration of intravenous contrast. Multiplanar CT image reconstructions and MIPs were obtained to evaluate the vascular anatomy. CONTRAST:  25mL ISOVUE-370 IOPAMIDOL (ISOVUE-370) INJECTION 76% COMPARISON:  Chest x-ray obtained earlier today; prior CT scan of the abdomen and pelvis including lung bases 05/06/2018 FINDINGS: Cardiovascular: Except below opacifications of the pulmonary arteries to the proximal segmental level. The main pulmonary artery is mildly enlarged at 3.2 cm. No evidence of central pulmonary embolus. Mild cardiomegaly with right atrial enlargement. Normal caliber aorta. No  pericardial effusion. Normal pulmonary venous drainage pattern. Mediastinum/Nodes: Unremarkable CT appearance of the thyroid gland. No suspicious mediastinal or hilar adenopathy. No soft tissue mediastinal mass. The thoracic esophagus is unremarkable. Lungs/Pleura: Small layering left pleural effusion without evidence of loculation or enhancing pleural rind. While there is some associated left lower lobe atelectasis, the degree of atelectasis the is greater than expected and there is also some patchy airspace opacity in a peribronchovascular distribution more centrally within the left lower lobe. Overall, these findings suggest left lower lobe bronchopneumonia with associated parapneumonic effusion. Minimal dependent atelectasis also present in the right lower lobe. No emphysematous changes. No suspicious pulmonary nodule or mass. Upper Abdomen: Circumscribed water attenuation lesions in the liver most consistent with simple hepatic cysts. Similar findings were evident on prior CT imaging of the abdomen and pelvis. No acute abnormality within  the visualized upper abdomen. Musculoskeletal: No acute fracture or aggressive appearing lytic or blastic osseous lesion. Review of the MIP images confirms the above findings. IMPRESSION: 1. Left lower lobe broncho pneumonia with small likely parapneumonic effusion. No CT evidence to suggest empyema. 2. No evidence of acute pulmonary embolus to the lobar level. 3. Enlarged main pulmonary artery and right atrial dilatation suggesting underlying pulmonary arterial hypertension with elevated right heart pressures. Electronically Signed   By: Jacqulynn Cadet M.D.   On: 10/19/2018 09:50   Dg Chest Port 1 View  Result Date: 10/27/2018 CLINICAL DATA:  Chest tube. EXAM: PORTABLE CHEST 1 VIEW COMPARISON:  One-view chest x-ray 10/26/2018 FINDINGS: The heart is enlarged. One of the left-sided chest tube was removed. Left pleural effusion remains. There is no pneumothorax. Right IJ  line is stable. Left basilar airspace disease is unchanged. Mild atelectasis at the right base is slightly improved. IMPRESSION: 1. Interval removal of 1 left-sided chest tube without pneumothorax. 2. Stable left pleural effusion and basilar airspace disease. While this likely reflects atelectasis, infection is not excluded. 3. Slight improved atelectasis at the right base. Electronically Signed   By: San Morelle M.D.   On: 10/27/2018 09:27   Dg Chest Port 1 View  Result Date: 10/26/2018 CLINICAL DATA:  Chest tube.  Sore chest. EXAM: PORTABLE CHEST 1 VIEW COMPARISON:  10/25/2018. FINDINGS: Right IJ line and left chest tubes in stable position. No pneumothorax. Stable cardiomegaly. Unchanged bibasilar atelectasis. Unchanged left-sided pleural thickening. IMPRESSION: 1. Right IJ line and left chest tubes in stable position. No pneumothorax. 2.  Stable bibasilar atelectasis and left-sided pleural effusion. 3.  Stable cardiomegaly. Electronically Signed   By: Marcello Moores  Register   On: 10/26/2018 08:51   Dg Chest Port 1 View  Result Date: 10/25/2018 CLINICAL DATA:  Status post VATS on the left EXAM: PORTABLE CHEST 1 VIEW COMPARISON:  10/24/2018 FINDINGS: Cardiac shadow remains enlarged. Endotracheal tube, nasogastric catheter and right jugular central line are again seen and stable. Two chest tubes are noted on the left without evidence of pneumothorax. Residual left pleural effusion is again identified and stable. Persistent increased left retrocardiac density is noted. The right lung remains clear. IMPRESSION: Stable appearance of left empyema and lower lobe consolidation. Tubes and lines as described. Electronically Signed   By: Inez Catalina M.D.   On: 10/25/2018 07:55   Dg Chest Port 1 View  Result Date: 10/24/2018 CLINICAL DATA:  Endotracheal tube placement. EXAM: PORTABLE CHEST 1 VIEW COMPARISON:  Chest radiograph and CT of the chest performed 10/23/2018 FINDINGS: The patient's endotracheal tube  is seen ending 5-6 cm above the carina. A right IJ line is noted ending about the proximal to mid SVC. A persistent large loculated left-sided pleural effusion is again noted, with underlying airspace consolidation, concerning for pneumonia and empyema, as noted on the prior CT. The right lung appears relatively clear. No pneumothorax is seen The cardiomediastinal silhouette is mildly enlarged. No acute osseous abnormalities are identified. IMPRESSION: 1. Endotracheal tube seen ending 5-6 cm above the carina. 2. Persistent large loculated left-sided pleural effusion, with underlying airspace consolidation, concerning for pneumonia and empyema, as noted on the prior CT. 3. Mild cardiomegaly. Electronically Signed   By: Garald Balding M.D.   On: 10/24/2018 15:30   Dg Abd Portable 1v  Result Date: 10/25/2018 CLINICAL DATA:  62 year old female with advancement of enteric tube. EXAM: PORTABLE ABDOMEN - 1 VIEW COMPARISON:  Area radiograph dated 10/25/2018 FINDINGS: Interval advancement of enteric tube  with side port and tip in the body of the stomach. Air is noted in the colon. Right upper quadrant cholecystectomy clips. Partially visualized left lung base opacity. IMPRESSION: Enteric tube with side port and tip in the body of the stomach. Electronically Signed   By: Anner Crete M.D.   On: 10/25/2018 02:11   Dg Abd Portable 1v  Result Date: 10/25/2018 CLINICAL DATA:  62 year old female status post NG tube placement. EXAM: PORTABLE ABDOMEN - 1 VIEW COMPARISON:  Chest radiograph dated 10/24/2018 FINDINGS: Partially visualized enteric tube with side port in the region of the gastroesophageal junction and tip in the proximal stomach. Recommend further advancing the tube into the stomach by an additional 14 cm. Air is noted within the colon. Left lung base opacity as seen on the earlier radiograph. IMPRESSION: Enteric tube with side port at the GE junction. Recommend further advancing of the tube into the  stomach. Electronically Signed   By: Anner Crete M.D.   On: 10/25/2018 00:56   Vas [place] Lower Extremity Venous (dvt)  Result Date: 10/21/2018  Lower Venous Study Performing Technologist: BYNUM, [place], C  Examination Guidelines: A complete evaluation includes B-mode imaging, spectral Doppler, color Doppler, and power Doppler as needed of all accessible portions of each vessel. Bilateral testing is considered an integral part of a complete examination. Limited examinations for reoccurring indications may be performed as noted.  Right Venous Findings: +---------+---------------+---------+-----------+----------+------------------+          CompressibilityPhasicitySpontaneityPropertiesSummary            +---------+---------------+---------+-----------+----------+------------------+ CFV      Full           Yes      Yes                                     +---------+---------------+---------+-----------+----------+------------------+ SFJ      Full                                                            +---------+---------------+---------+-----------+----------+------------------+ FV Prox  Full                                                            +---------+---------------+---------+-----------+----------+------------------+ FV Mid   Full                                                            +---------+---------------+---------+-----------+----------+------------------+ FV DistalFull                                         poor visualization +---------+---------------+---------+-----------+----------+------------------+ PFV      Full                                                            +---------+---------------+---------+-----------+----------+------------------+  POP      Full           Yes      Yes                                     +---------+---------------+---------+-----------+----------+------------------+ PTV      Full                                          limited                                                                  visualization      +---------+---------------+---------+-----------+----------+------------------+ PERO     Full                                         limited                                                                  visualization      +---------+---------------+---------+-----------+----------+------------------+ GSV      Full                                                            +---------+---------------+---------+-----------+----------+------------------+  Left Venous Findings: +---------+---------------+---------+-----------+----------+------------------+          CompressibilityPhasicitySpontaneityPropertiesSummary            +---------+---------------+---------+-----------+----------+------------------+ CFV      Full           Yes      Yes                                     +---------+---------------+---------+-----------+----------+------------------+ SFJ      Full                                                            +---------+---------------+---------+-----------+----------+------------------+ FV Prox  Full                                                            +---------+---------------+---------+-----------+----------+------------------+ FV Mid   Full                                                            +---------+---------------+---------+-----------+----------+------------------+  FV DistalFull                                         poor visualization +---------+---------------+---------+-----------+----------+------------------+ PFV      Full                                                            +---------+---------------+---------+-----------+----------+------------------+ POP      Full           Yes      Yes                                      +---------+---------------+---------+-----------+----------+------------------+ PTV      Full                                         limited                                                                  visualization      +---------+---------------+---------+-----------+----------+------------------+ PERO     Full                                         limited                                                                  visualization      +---------+---------------+---------+-----------+----------+------------------+ GSV      Full                                                            +---------+---------------+---------+-----------+----------+------------------+    Summary: Right: There is no evidence of deep vein thrombosis in the lower extremity. However, portions of this examination were limited- see technologist comments above. No cystic structure found in the popliteal fossa. somewhat difficult evaluation due to patient body habitus and penetration. Left: There is no evidence of deep vein thrombosis in the lower extremity. However, portions of this examination were limited- see technologist comments above. No cystic structure found in the popliteal fossa. somewhat difficult evaluation due to patient  body habitus and penetration.  *See table(s) above for measurements and observations. Electronically signed by Monica Martinez MD on 10/21/2018 at 3:57:20 PM.    Final    US Thoracentesis Asp Pleural Space  W/img Guide  Result Date: 10/22/2018 INDICATION: 62 year old female with symptomatic left pleural effusion. EXAM: ULTRASOUND GUIDED LEFT THORACENTESIS MEDICATIONS: None. COMPLICATIONS: None immediate. PROCEDURE: An ultrasound guided thoracentesis was thoroughly discussed with the patient and questions answered. The benefits, risks, alternatives and complications were also discussed. The patient understands and wishes to proceed with the procedure. Written  consent was obtained. Ultrasound was performed to localize and mark an adequate pocket of fluid in the left chest. The area was then prepped and draped in the normal sterile fashion. 1% Lidocaine was used for local anesthesia. Under ultrasound guidance a 6 Fr Safe-T-Centesis catheter was introduced. Thoracentesis was performed. The catheter was removed and a dressing applied. FINDINGS: A total of approximately 970 mL of yellow pleural fluid was removed. Samples were sent to the laboratory as requested by the clinical team. IMPRESSION: Successful ultrasound guided left thoracentesis yielding 970 mL of pleural fluid. Electronically Signed   By: Jacqulynn Cadet M.D.   On: 10/22/2018 16:31      LAB RESULTS: Basic Metabolic Panel: Recent Labs  Lab 10/26/18 0433 10/27/18 0535 10/30/18 0318  NA 141 142 140  K 3.6 4.0 3.4*  CL 109 109 103  CO2 28 28 30   GLUCOSE 93 103* 97  BUN 12 7* <5*  CREATININE 0.84 0.79 0.73  CALCIUM 8.2* 8.4* 8.2*  MG 2.3  --   --   PHOS 2.8  --   --    Liver Function Tests: Recent Labs  Lab 10/26/18 0433  AST 20  ALT 25  ALKPHOS 90  BILITOT 0.5  PROT 5.3*  ALBUMIN 1.6*   No results for input(s): LIPASE, AMYLASE in the last 168 hours. No results for input(s): AMMONIA in the last 168 hours. CBC: Recent Labs  Lab 10/30/18 0318 10/31/18 0332  WBC 15.0* 13.0*  HGB 9.0* 8.9*  HCT 29.3* 29.0*  MCV 85.4 85.5  PLT 442* 447*   Cardiac Enzymes: No results for input(s): CKTOTAL, CKMB, CKMBINDEX, TROPONINI in the last 168 hours. BNP: Invalid input(s): POCBNP CBG: Recent Labs  Lab 11/01/18 0606 11/01/18 1138  GLUCAP 101* 125*      Disposition and Follow-up: Discharge Instructions    Diet Carb Modified   Complete by:  As directed    Increase activity slowly   Complete by:  As directed        DISPOSITION: Home with home health PT OT, RN, home health aide   DISCHARGE FOLLOW-UP Follow-up Information    McGowen, Adrian [place]water, MD. Schedule an  appointment as soon as possible for a visit in 2 week(s).   Specialty:  Family Medicine Contact information: [address] [place] [address] [phone]        Grace Isaac, MD Follow up.   Specialty:  Cardiothoracic Surgery Why:  Your routine follow-up appointment is on 11/21 at 11:00am. Please report to [facility] at 10:30am for a chest xray which is located on the first floor of our building.  Contact information: [address] [place] [address] [phone]        [facility] Follow up on 11/05/2018.   Specialty:  Cardiothoracic Surgery Why:  Appointment is at 3:30 for wound check Contact information: [place], [place] [phone]       Rigoberto Noel, MD .   Specialty:  Pulmonary Disease Contact information: [address] [address] [phone] Appointment scheduled for  11/19/2018 at 10:15 AM           Time coordinating discharge:  40 minutes  Signed:   Estill Cotta M.D. [facility] 11/01/2018, 12:20 PM Pager: [phone]

## 2018-11-01 NOTE — Progress Notes (Signed)
Telemetry discontinued at this time. CCMD notified. O2 delivered to patient's room to go home with. Discharge instructions reviewed with patient and all questions answered.   Emelda Fear, RN

## 2018-11-01 NOTE — Evaluation (Signed)
Physical Therapy Evaluation Patient Details Name: Taylor Leon MRN: [id number] DOB: [birth year] Today's Date: 11/01/2018   History of Present Illness  Pt is a 62 y.o. F with significant PMH of morbid obesity, asthma, and DM2 who was admitted 10/25 with CAP and small parapneumonic effusion. Left thoracentesis on 10/28. Underwent VATS on 10/30. Unable to be extubated immediately post operatively and extubated 10/31.   Clinical Impression  Pt admitted with above diagnosis. Pt currently with functional limitations due to the deficits listed below (see PT Problem List). Presenting with decreased functional mobility secondary to weakness, pulmonary issues, and decreased activity tolerance. Ambulating 100 feet with walker and supervision. Received on 2L O2 at 100% SpO2 at rest, decreased to 86% SpO2 with ambulation, PT increased to 3L O2 with subsequent increase to 91% SpO2.  Pt will benefit from skilled PT to increase their independence and safety with mobility to allow discharge to the venue listed below.       Follow Up Recommendations Home health PT;Supervision for mobility/OOB (declining SNF)    Equipment Recommendations  Rolling walker with 5" wheels((bariatric))    Recommendations for Other Services       Precautions / Restrictions Precautions Precautions: Fall Precaution Comments: watch O2 Restrictions Weight Bearing Restrictions: No      Mobility  Bed Mobility Overal bed mobility: Modified Independent                Transfers Overall transfer level: Needs assistance Equipment used: Rolling walker (2 wheeled);None Transfers: Sit to/from Stand Sit to Stand: Supervision         General transfer comment: supervision for transfers from edge of bed and toilet  Ambulation/Gait Ambulation/Gait assistance: Supervision;Min guard Gait Distance (Feet): 100 Feet Assistive device: Rolling walker (2 wheeled);None Gait Pattern/deviations: Step-through pattern;Trunk  flexed;Decreased stride length Gait velocity: decreased   General Gait Details: Patient with increased independence ambulating with walker versus no assistive device as she tends to furniture walks.   Stairs            Wheelchair Mobility    Modified Rankin (Stroke Patients Only)       Balance Overall balance assessment: Needs assistance Sitting-balance support: Feet supported;Bilateral upper extremity supported Sitting balance-Leahy Scale: Good     Standing balance support: No upper extremity supported;During functional activity Standing balance-Leahy Scale: Fair                               Pertinent Vitals/Pain Pain Assessment: Faces Faces Pain Scale: Hurts a little bit Pain Location: chronic knee pain Pain Descriptors / Indicators: Aching Pain Intervention(s): Monitored during session    Home Living Family/patient expects to be discharged to:: Private residence Living Arrangements: Spouse/significant other Available Help at Discharge: Available 24 hours/day Type of Home: House Home Access: Stairs to enter Entrance Stairs-Rails: Left Entrance Stairs-Number of Steps: 5-6 Home Layout: One level Home Equipment: Grab bars - tub/shower      Prior Function Level of Independence: Independent         Comments: Limited community ambulatory     Hand Dominance   Dominant Hand: Right    Extremity/Trunk Assessment   Upper Extremity Assessment Upper Extremity Assessment: Defer to OT evaluation    Lower Extremity Assessment Lower Extremity Assessment: RLE deficits/detail;LLE deficits/detail RLE Deficits / Details: Grossly 5/5 except hip flexors 4/5 LLE Deficits / Details: Grossly 5/5 except hip flexors 3+/5    Cervical / Trunk Assessment Cervical / Trunk Assessment:  Other exceptions Cervical / Trunk Exceptions: increased body habitus  Communication   Communication: No difficulties  Cognition Arousal/Alertness: Awake/alert Behavior  During Therapy: WFL for tasks assessed/performed Overall Cognitive Status: Within Functional Limits for tasks assessed                                 General Comments: Requiring increased encouragement to participate. Difficult to engage      General Comments      Exercises     Assessment/Plan    PT Assessment Patient needs continued PT services  PT Problem List Decreased strength;Decreased activity tolerance;Decreased balance;Decreased mobility;Cardiopulmonary status limiting activity;Obesity       PT Treatment Interventions DME instruction;Gait training;Stair training;Functional mobility training;Therapeutic activities;Therapeutic exercise;Balance training;Patient/family education    PT Goals (Current goals can be found in the Care Plan section)  Acute Rehab PT Goals Patient Stated Goal: "do what I can to wean off oxygen." PT Goal Formulation: With patient Time For Goal Achievement: 11/15/18 Potential to Achieve Goals: Good    Frequency Min 3X/week   Barriers to discharge        Co-evaluation               AM-PAC PT "6 Clicks" Daily Activity  Outcome Measure Difficulty turning over in bed (including adjusting bedclothes, sheets and blankets)?: A Little Difficulty moving from lying on back to sitting on the side of the bed? : A Little Difficulty sitting down on and standing up from a chair with arms (e.g., wheelchair, bedside commode, etc,.)?: A Little Help needed moving to and from a bed to chair (including a wheelchair)?: A Little Help needed walking in hospital room?: A Little Help needed climbing 3-5 steps with a railing? : A Lot 6 Click Score: 17    End of Session Equipment Utilized During Treatment: Oxygen Activity Tolerance: Patient tolerated treatment well Patient left: in chair;with call bell/phone within reach Nurse Communication: Mobility status PT Visit Diagnosis: Difficulty in walking, not elsewhere classified (R26.2);Unsteadiness  on feet (R26.81)    Time: 4585-9292 PT Time Calculation (min) (ACUTE ONLY): 34 min   Charges:   PT Evaluation $PT Eval Moderate Complexity: 1 Mod PT Treatments $Therapeutic Activity: 8-22 mins       Taylor Leon, PT, DPT Acute Rehabilitation Services Pager [phone] Office [phone]   Taylor Leon 11/01/2018, 8:58 AM

## 2018-11-01 NOTE — Care Management Note (Addendum)
Case Management Note Marvetta Gibbons RN, BSN Transitions of Care Unit 4E- RN Case Manager [phone]  Patient Details  Name: Arielys Wandersee MRN: [id number] Date of Birth: May 06, 1956  Subjective/Objective:   Pt admitted with CAP, empyema s/p VATS                 Action/Plan: PTA pt lived at home with spouse, per PT/OT recommendations for SNF vs [facility] however in speaking with pt she has declined both SNF and HH. Per conversation with pt she has RW at home and PCP. She does report need for medication assistance- pt is eligible for MATCH and CM explained copay $3 per script. [place] pharmacy has spoken with pt and will plan to fill meds prior to discharge and deliver to bedside. [place] letter done and placed on shadow chart for [facility] pharmacy Updated: [id number]- received call from bedside RN, orders have now been placed for [facility] and DME- RW and home 02- as stated above- pt has declined [facility] services and has RW- CM has reached out to [place] with [facility] for home 02 needs- to assess for charity home 02 vs pt ability to pay.   Expected Discharge Date:  11/01/18               Expected Discharge Plan:  Home/Self Care  In-House Referral:  NA  Discharge planning Services  CM Consult  Post Acute Care Choice:  Durable Medical Equipment, Home Health Choice offered to:  Patient  DME Arranged:  N/A DME Agency:  NA  HH Arranged:  Refused SNF, Patient Refused [facility] Agency:  NA  Status of Service:  Completed, signed off  If discussed at [place] of Stay Meetings, dates discussed:    Discharge Disposition: home/self care   Additional Comments:  Dawayne Patricia, RN 11/01/2018, 10:00 AM

## 2018-11-01 NOTE — Progress Notes (Addendum)
SATURATION QUALIFICATIONS: (This note is used to comply with regulatory documentation for home oxygen)  Patient Saturations on Room Air at Rest = 82%  Patient Saturations on Room Air while Ambulating = N/A due to desaturation to 82% at rest  Patient Saturations on 3 Liters of oxygen while Ambulating = 91%  Please briefly explain why patient needs home oxygen: To maintain oxygen saturations > 90%   Ellamae Sia, [place], DPT Acute Rehabilitation Services Pager [phone] Office [phone]

## 2018-11-05 ENCOUNTER — Telehealth: Payer: Self-pay

## 2018-11-05 ENCOUNTER — Other Ambulatory Visit: Payer: Self-pay

## 2018-11-05 ENCOUNTER — Ambulatory Visit (INDEPENDENT_AMBULATORY_CARE_PROVIDER_SITE_OTHER): Payer: Self-pay | Admitting: Surgical

## 2018-11-05 VITALS — BP 104/56 | HR 98 | Temp 98.0°F | Resp 20 | Ht 67.0 in | Wt 325.0 lb

## 2018-11-05 DIAGNOSIS — J869 Pyothorax without fistula: Secondary | ICD-10-CM

## 2018-11-05 DIAGNOSIS — Z5189 Encounter for other specified aftercare: Secondary | ICD-10-CM

## 2018-11-05 NOTE — Progress Notes (Signed)
[address]       [place] [address]             [phone]      Mayan Kielbasa Coldwater Medical Record #[id number] Date of Birth: Mar 01, 1956  Referring: Rush Farmer, MD Primary Care: Tammi Sou, MD Primary Cardiologist: No primary care provider on file.   Chief Complaint:   POST OP FOLLOW UP OPERATIVE REPORT  DATE OF PROCEDURE:  10/24/2018  PREOPERATIVE DIAGNOSIS:  Left chest empyema.  POSTOPERATIVE DIAGNOSIS:  Left chest empyema.  SURGICAL PROCEDURE:  Bronchoscopy, left video-assisted thoracoscopy, mini thoracotomy, drainage of empyema and decortication.  SURGEON:  Lanelle Bal, MD  FIRST ASSISTANT:  Nicholes Rough PA.   History of Present Illness:    The patient is a 62 year old female status post the above described procedure.  She is seen in the office on today's date to re- check her incisions/wound.  He denies any fevers, chills or other significant constitutional symptoms currently.  She has not had any drainage from the incisions.  She does have a wound under her left upper arm which appears to be related to a previous blister that developed.  This is exquisitely tender.  There is no purulent drainage associated with it or any evidence of cellulitis.      Past Medical History:  Diagnosis Date  . BPPV (benign paroxysmal positional vertigo) 01/30/2014  . Cyst, breast 01/2012; 06/2012; 01/29/25; 07/28/40   Complicated cysts in upper outer left breast--no evidence of malignancy--f/u bilat diag mammo/left breast u/s on 03/06/15 showed resolution of left breast cysts.  Repeat screening mammogram 1 yr.  . Diabetes mellitus without complication (HCC)    Pre-diabetes  . H/O allergic rhinitis    Dr. Ishmael Holter  . History of pneumonia 2009 and 2010  . Mild persistent asthma, well controlled 2014   New adult onset asthma after respiratory infection (Dr. Ishmael Holter)  . Morbid obesity ([place])    BMI 50+.  At one point in time she was going to Bariatric  clinic on [address] and got weekly HCG injections, monthly vit B12 injections, and took phentermine daily.  As of 05/2015 she was no longer doing this.  . Nephrolithiasis 04/2018 first episode   [facility]. Lovena Neighbours ([facility]): 1.38mm right UVJ stone + 6 mm L AML.  Pt elected for trial of passage.  . Osteoarthritis of both knees    No improvement with steroid injections; did synvisc x 3 yrs; bilat replacement was recommended but she declined.  . Prediabetes 2016   05/2015 A1c was 6.3%  . Recurrent low back pain      Social History   Tobacco Use  Smoking Status Former Smoker  . Types: Cigarettes  . Last attempt to quit: 12/28/2009  . Years since quitting: 8.8  Smokeless Tobacco Never Used    Social History   Substance and Sexual Activity  Alcohol Use Yes   Comment: social     Allergies  Allergen Reactions  . Penicillins Nausea Only and Rash    Has patient had a PCN reaction causing immediate rash, facial/tongue/throat swelling, SOB or lightheadedness with hypotension: yES Has patient had a PCN reaction causing severe rash involving mucus membranes or skin necrosis: No Has patient had a PCN reaction that required hospitalization: No Has patient had a PCN reaction occurring within the last 10 years: No If all of the above answers are "NO", then may proceed with Cephalosporin use. CC    Current  Outpatient Medications  Medication Sig Dispense Refill  . albuterol (PROVENTIL) (2.5 MG/3ML) 0.083% nebulizer solution Take 3 mLs (2.5 mg total) by nebulization every 6 (six) hours as needed for wheezing. 45 mL 1  . albuterol (VENTOLIN HFA) 108 (90 Base) MCG/ACT inhaler Inhale 2 puffs into the lungs every 4 (four) hours as needed for wheezing. 1 Inhaler 1  . ascorbic acid (VITAMIN C) 1000 MG tablet Take 1,000 mg by mouth daily.    Marland Kitchen BAYER MICROLET LANCETS lancets Use to check blood sugar once daily 100 each 11  . benzonatate (TESSALON) 200 MG capsule Take 1 capsule (200 mg total) by mouth 3  (three) times daily as needed for cough. 30 capsule 0  . Cholecalciferol (VITAMIN D-3 PO) Take 1 tablet by mouth daily.    Marland Kitchen doxycycline (VIBRA-TABS) 100 MG tablet Take 1 tablet (100 mg total) by mouth 2 (two) times daily for 19 days. 38 tablet 0  . glucose blood (CONTOUR NEXT TEST) test strip Use to check blood sugar once daily 100 each 11  . guaiFENesin-dextromethorphan (ROBITUSSIN DM) 100-10 MG/5ML syrup Take 5 mLs by mouth every 4 (four) hours as needed for cough. 118 mL 0  . meloxicam (MOBIC) 7.5 MG tablet 1-2 tabs po qd prn arthritis pain 60 tablet 6  . metFORMIN (GLUCOPHAGE) 1000 MG tablet 1 tab po bid with meals 60 tablet 6  . metroNIDAZOLE (FLAGYL) 500 MG tablet Take 1 tablet (500 mg total) by mouth 3 (three) times daily for 19 days. 57 tablet 0  . Misc Natural Products (TURMERIC CURCUMIN) CAPS Take 1 capsule by mouth 3 (three) times a week. Monday, Wednesday, and Friday.    . Multiple Vitamin (MULTIVITAMIN) capsule Take 1 capsule by mouth daily.    . naproxen sodium (ALEVE) 220 MG tablet Take 220 mg by mouth daily as needed (arthritis pain.).      No current facility-administered medications for this visit.        Physical Exam: Ht 5\' 7"  (1.702 m)   BMI 52.89 kg/m   General appearance: alert, cooperative and no distress Heart: regular rate and rhythm Lungs: Diminished in the left lower fields. Abdomen: Obese, nontender Extremities: No calf tenderness Wound: The surgical incision is healing well without evidence of infection.  The wound from the previous blister site of the left upper arm does not appear infected, however it is exquisitely tender with erythematous granulation tissue base.  No evidence of cellulitis.  No purulence.   Diagnostic Studies & Laboratory data:     Recent Radiology Findings:   No results found.    Recent Lab Findings: Lab Results  Component Value Date   WBC 13.0 (H) 10/31/2018   HGB 8.9 (L) 10/31/2018   HCT 29.0 (L) 10/31/2018   PLT 447  (H) 10/31/2018   GLUCOSE 97 10/30/2018   CHOL 199 06/20/2017   TRIG 148 10/24/2018   HDL 84.00 06/20/2017   LDLCALC 92 06/20/2017   ALT 25 10/26/2018   AST 20 10/26/2018   NA 140 10/30/2018   K 3.4 (L) 10/30/2018   CL 103 10/30/2018   CREATININE 0.73 10/30/2018   BUN <5 (L) 10/30/2018   CO2 30 10/30/2018   TSH 1.09 06/20/2017   HGBA1C 5.6 10/10/2018      Assessment / Plan: Appears stable.  She will continue her current antibiotics dosing as previously scheduled for 19 days post discharge including rifampin and doxycycline.  We discussed wound care and primarily she needs to keep the site clean  and dry.  She is using some Neosporin on the base of the wound which is okay for now but not long-term.  We will see her again in 1 week and also recheck chest x-ray at that time.  She knows to contact the office if the wounds appear worse, specifically with increasing purulent drainage or increasing redness.  Also if she has any elevated temperatures or other symptoms such as fevers and chills.          John Giovanni, PA-C 11/05/2018 3:22 PM

## 2018-11-05 NOTE — Telephone Encounter (Signed)
Noted: nurse phone contact with patient for TCM. Signed:  Crissie Sickles, MD           11/05/2018

## 2018-11-05 NOTE — Telephone Encounter (Signed)
Transition Care Management Follow-up Telephone Call  Admit date: 10/19/2018 Discharge date: 11/01/2018 Diagnosis: Acute hypoxic respiratory failure secondary to community-acquired pneumonia, empyema   How have you been since you were released from the hospital? "I'm getting better"   Do you understand why you were in the hospital? yes, "bacterial pneumonia"   Do you understand the discharge instructions? yes   Where were you discharged to? Home.    Items Reviewed:  Medications reviewed: no, advised to bring to f/u appt  Allergies reviewed: yes  Dietary changes reviewed: yes  Referrals reviewed: yes   Functional Questionnaire:   Activities of Daily Living (ADLs):   She states they are independent in the following: ambulation, bathing and hygiene, feeding, continence, grooming, toileting and dressing. Discharged with order for rolling walker, did not obtain. States she does not need this. Discharged on oxygen, compliant with use.  States they require assistance with the following: None.    Any transportation issues/concerns?: no   Any patient concerns? no   Confirmed importance and date/time of follow-up visits scheduled yes  Provider Appointment booked with PCP 11/09/18 @ 11am.   Confirmed with patient if condition begins to worsen call PCP or go to the ER.  Patient was given the office number and encouraged to call back with question or concerns.  : yes

## 2018-11-05 NOTE — Patient Instructions (Signed)
Continue current wound care, keep the area clean and dry

## 2018-11-08 ENCOUNTER — Encounter: Payer: Self-pay | Admitting: Family Medicine

## 2018-11-09 ENCOUNTER — Encounter: Payer: Self-pay | Admitting: Family Medicine

## 2018-11-09 ENCOUNTER — Ambulatory Visit: Payer: Self-pay | Admitting: Family Medicine

## 2018-11-09 VITALS — BP 98/65 | HR 78 | Temp 98.6°F | Resp 16 | Ht 67.0 in | Wt 331.4 lb

## 2018-11-09 DIAGNOSIS — J869 Pyothorax without fistula: Secondary | ICD-10-CM

## 2018-11-09 DIAGNOSIS — J189 Pneumonia, unspecified organism: Secondary | ICD-10-CM

## 2018-11-09 DIAGNOSIS — J9601 Acute respiratory failure with hypoxia: Secondary | ICD-10-CM

## 2018-11-09 NOTE — Progress Notes (Signed)
11/09/2018  CC:  Chief Complaint  Patient presents with  . Hospitalization Follow-up    TCM    Patient is a 62 y.o. African-American female who presents accompanied by her husband for hospital follow up, specifically [facility] face-to-face visit. Dates hospitalized: 10/25-11/7, 2019. Days since d/c from hospital: 8 Patient was discharged from hospital to home. Reason for admission to hospital: pneumonia with acute hypoxic RF. Date of interactive (phone) contact with patient and/or caregiver:  11/05/18.  I have reviewed patient's discharge summary plus pertinent specific notes, labs, and imaging from the hospitalization.   Pneumonia, empyema that required VATS/drainage-- +infectious features.  Home with oxygen, plan for sleep study in future.  She is in the midst of long course of doxycycline and metronidazole. Getting a little better each day.  Cough, energy, appetite improving. Some SOB at rest when oxygen is off.  When she walks w/out oxygen she desats to 80s, rises to >92% with 1.5L oxygen. No CP, no fevers, no abd pain, no dizziness. Glucoses 130-150 range lately: they gave her some insulin while hospitalized.  Medication reconciliation was done today and patient is taking meds as recommended by discharging hospitalist/specialist.    PMH:  Past Medical History:  Diagnosis Date  . BPPV (benign paroxysmal positional vertigo) 01/30/2014  . CAP (community acquired pneumonia) 10/2018   with acute hypoxic RF, empyema-->VATS  . Cyst, breast 01/2012; 06/2012; 07/26/84; 05/28/13   Complicated cysts in upper outer left breast--no evidence of malignancy--f/u bilat diag mammo/left breast u/s on 03/06/15 showed resolution of left breast cysts.  Repeat screening mammogram 1 yr.  . Diabetes mellitus without complication (HCC)    Pre-diabetes  . Empyema ([place])    drained by CT surgery/VATS.  Marland Kitchen H/O allergic rhinitis    Dr. Ishmael Holter  . History of pneumonia 2009 and 2010  . Mild  persistent asthma, well controlled 2014   New adult onset asthma after respiratory infection (Dr. Ishmael Holter)  . Morbid obesity ([place])    BMI 50+.  At one point in time she was going to Bariatric clinic on [address] and got weekly HCG injections, monthly vit B12 injections, and took phentermine daily.  As of 05/2015 she was no longer doing this.  . Nephrolithiasis 04/2018 first episode   [facility]ighbours [facility] 1.95mm right UVJ stone + 6 mm L AML.  Pt elected for trial of passage.  . Osteoarthritis of both knees    No improvement with steroid injections; did synvisc x 3 yrs; bilat replacement was recommended but she declined.  . Recurrent low back pain     PSH:  Past Surgical History:  Procedure Laterality Date  . APPENDECTOMY  1978  . CARDIAC CATHETERIZATION  2010   Normal coronaries per pt  . CARDIOVASCULAR STRESS TEST  2010   abnl per pt; f/u cath clean  . CESAREAN SECTION     x 3  . CHOLECYSTECTOMY  1986  . COLONOSCOPY  2008   normal per pt report (in [place])  . EMPYEMA DRAINAGE Left 10/24/2018   Evacuation of empyema with decortication.  Procedure: EMPYEMA DRAINAGE;  Surgeon: Grace Isaac, MD;  Location: [place];  Service: Thoracic;  Laterality: Left;  . LUMBAR DISC SURGERY  1993   L5/S1  . TONSILLECTOMY AND ADENOIDECTOMY  1967ish  . TOTAL ABDOMINAL HYSTERECTOMY  1994   Ovaries are still in.  This was done for DUB/fibroids.  Marland Kitchen VIDEO ASSISTED THORACOSCOPY (VATS)/THOROCOTOMY Left 10/24/2018   Procedure: VIDEO ASSISTED THORACOSCOPY (VATS)/THOROCOTOMY with  EVACUATION OF EMPYEMA AND DECORTICATION.;  Surgeon: Grace Isaac, MD;  Location: [place];  Service: Thoracic;  Laterality: Left;  Marland Kitchen VIDEO BRONCHOSCOPY N/A 10/24/2018   Procedure: VIDEO BRONCHOSCOPY;  Surgeon: Grace Isaac, MD;  [facility];  Service: Thoracic;  Laterality: N/A;    MEDS:  Outpatient Medications Prior to Visit  Medication Sig Dispense Refill  . albuterol (PROVENTIL) (2.5 MG/3ML) 0.083% nebulizer  solution Take 3 mLs (2.5 mg total) by nebulization every 6 (six) hours as needed for wheezing. 45 mL 1  . albuterol (VENTOLIN HFA) 108 (90 Base) MCG/ACT inhaler Inhale 2 puffs into the lungs every 4 (four) hours as needed for wheezing. 1 Inhaler 1  . ascorbic acid (VITAMIN C) 1000 MG tablet Take 1,000 mg by mouth daily.    Marland Kitchen BAYER MICROLET LANCETS lancets Use to check blood sugar once daily 100 each 11  . benzonatate (TESSALON) 200 MG capsule Take 1 capsule (200 mg total) by mouth 3 (three) times daily as needed for cough. 30 capsule 0  . Cholecalciferol (VITAMIN D-3 PO) Take 1 tablet by mouth daily.    Marland Kitchen doxycycline (VIBRA-TABS) 100 MG tablet Take 1 tablet (100 mg total) by mouth 2 (two) times daily for 19 days. 38 tablet 0  . glucose blood (CONTOUR NEXT TEST) test strip Use to check blood sugar once daily 100 each 11  . guaiFENesin-dextromethorphan (ROBITUSSIN DM) 100-10 MG/5ML syrup Take 5 mLs by mouth every 4 (four) hours as needed for cough. 118 mL 0  . meloxicam (MOBIC) 7.5 MG tablet 1-2 tabs po qd prn arthritis pain 60 tablet 6  . metFORMIN (GLUCOPHAGE) 1000 MG tablet 1 tab po bid with meals 60 tablet 6  . metroNIDAZOLE (FLAGYL) 500 MG tablet Take 1 tablet (500 mg total) by mouth 3 (three) times daily for 19 days. 57 tablet 0  . Misc Natural Products (TURMERIC CURCUMIN) CAPS Take 1 capsule by mouth 3 (three) times a week. Monday, Wednesday, and Friday.    . Multiple Vitamin (MULTIVITAMIN) capsule Take 1 capsule by mouth daily.    . naproxen sodium (ALEVE) 220 MG tablet Take 220 mg by mouth daily as needed (arthritis pain.).      No facility-administered medications prior to visit.    EXAM: BP 98/65 (BP Location: Left Wrist, Patient Position: Sitting, Cuff Size: Normal)   Pulse 78   Temp 98.6 F (37 C) (Oral)   Resp 16   Ht 5\' 7"  (1.702 m)   Wt (!) 331 lb 6 oz (150.3 kg)   SpO2 97% Comment: 1.5L O2  BMI 51.90 kg/m   Gen: Alert, well appearing.  Patient is oriented to person, place,  time, and situation. AFFECT: pleasant, lucid thought and speech. NAT:FTDD: no injection, icteris, swelling, or exudate.  EOMI, PERRLA. Mouth: lips without lesion/swelling.  Oral mucosa pink and moist. Oropharynx without erythema, exudate, or swelling.  Neck - No masses or thyromegaly or limitation in range of motion CV: RRR, no m/r/g. LUNGS: Slight soft early insp crackles in L lower 1/2 lung field.  Aeration a bit diminished in both bases.  No egophony. Nonlabored resps. EXT: no clubbing or cyanosis.  no edema.     Pertinent labs/imaging   Chemistry      Component Value Date/Time   NA 140 10/30/2018 0318   K 3.4 (L) 10/30/2018 0318   CL 103 10/30/2018 0318   CO2 30 10/30/2018 0318   BUN <5 (L) 10/30/2018 0318   CREATININE 0.73 10/30/2018 0318  Component Value Date/Time   CALCIUM 8.2 (L) 10/30/2018 0318   ALKPHOS 90 10/26/2018 0433   AST 20 10/26/2018 0433   ALT 25 10/26/2018 0433   BILITOT 0.5 10/26/2018 0433     Lab Results  Component Value Date   WBC 13.0 (H) 10/31/2018   HGB 8.9 (L) 10/31/2018   HCT 29.0 (L) 10/31/2018   MCV 85.5 10/31/2018   PLT 447 (H) 10/31/2018     ASSESSMENT/PLAN:  1) Left bilobar pneumonia, with empyema (strep intermedius)--this occurred in the setting of an exacerbation of her asthma. She is doing pretty well, gradually improving. Tolerating antibiotics, requiring oxygen 1.5L (this has been weened down from the 3L she was discharged home on). She has pulm f/u 11/19/18. Discussed basic recovery expectations.   No labs or imaging needed today.  2) Normocytic anemia: suspect some hemodilution + acute blood loss with VATS.  Hb stable in hosp, 8.9 at d/c.  3) DM 2: glucoses are above her baseling but reasonable.   Lab Results  Component Value Date   HGBA1C 5.6 10/10/2018   Continue metformin.  4) Suspected OSA: will be getting pulm eval/sleep study after she has recovered.  Medical decision making of moderate complexity was  utilized today.  FOLLOW UP:  She can f/u with me after she sees pulmonary MD if needed. She will have lots of specialist f/u in the next little while (CT surgery, cardiology, pulm).  Signed:  Crissie Sickles, MD           11/14/2018

## 2018-11-14 ENCOUNTER — Other Ambulatory Visit: Payer: Self-pay | Admitting: *Deleted

## 2018-11-14 DIAGNOSIS — Z9889 Other specified postprocedural states: Secondary | ICD-10-CM

## 2018-11-15 ENCOUNTER — Other Ambulatory Visit: Payer: Self-pay

## 2018-11-15 ENCOUNTER — Encounter: Payer: Self-pay | Admitting: Cardiothoracic Surgery

## 2018-11-15 ENCOUNTER — Ambulatory Visit (INDEPENDENT_AMBULATORY_CARE_PROVIDER_SITE_OTHER): Payer: Self-pay | Admitting: Cardiothoracic Surgery

## 2018-11-15 ENCOUNTER — Ambulatory Visit
Admission: RE | Admit: 2018-11-15 | Discharge: 2018-11-15 | Disposition: A | Discharge status: Home / Self Care | Payer: Self-pay | Source: Ambulatory Visit | Attending: Cardiothoracic Surgery | Admitting: Cardiothoracic Surgery

## 2018-11-15 VITALS — BP 124/66 | HR 85 | Resp 20 | Ht 67.0 in | Wt 339.0 lb

## 2018-11-15 DIAGNOSIS — Z9889 Other specified postprocedural states: Secondary | ICD-10-CM

## 2018-11-15 DIAGNOSIS — R059 Cough, unspecified: Secondary | ICD-10-CM

## 2018-11-15 DIAGNOSIS — Z09 Encounter for follow-up examination after completed treatment for conditions other than malignant neoplasm: Secondary | ICD-10-CM

## 2018-11-15 DIAGNOSIS — R05 Cough: Secondary | ICD-10-CM

## 2018-11-15 DIAGNOSIS — J869 Pyothorax without fistula: Secondary | ICD-10-CM

## 2018-11-15 MED ORDER — BENZONATATE 200 MG PO CAPS: 200.0000 mg | ORAL_CAPSULE | Freq: Three times a day (TID) | ORAL | 0 refills | Status: DC | PRN

## 2018-11-15 NOTE — Progress Notes (Signed)
[address]       [place] [address]             [phone]      Dafney Sagrero [place] Medical Record #[id number] Date of Birth: 06/21/1956  Referring: Rush Farmer, MD Primary Care: Tammi Sou, MD Primary Cardiologist: No primary care provider on file.   Chief Complaint:   POST OP FOLLOW UP OPERATIVE REPORT DATE OF PROCEDURE: 10/24/2018 PREOPERATIVE DIAGNOSIS: Left chest empyema. POSTOPERATIVE DIAGNOSIS: Left chest empyema. SURGICAL PROCEDURE: Bronchoscopy, left video-assisted thoracoscopy, mini thoracotomy, drainage of empyema and decortication. SURGEON: Lanelle Bal, MD  History of Present Illness:     Patient underwent left video-assisted thoracoscopy minithoracotomy decortication drainage of empyema November 30.  She is now to home on home oxygen     Past Medical History:  Diagnosis Date  . BPPV (benign paroxysmal positional vertigo) 01/30/2014  . CAP (community acquired pneumonia) 10/2018   with acute hypoxic RF, empyema-->VATS  . Cyst, breast 01/2012; 06/2012; 08/28/25; 06/26/23   Complicated cysts in upper outer left breast--no evidence of malignancy--f/u bilat diag mammo/left breast u/s on 03/06/15 showed resolution of left breast cysts.  Repeat screening mammogram 1 yr.  . Diabetes mellitus without complication (HCC)    Pre-diabetes  . Empyema ([place])    drained by CT surgery/VATS.  Marland Kitchen H/O allergic rhinitis    Dr. Ishmael Holter  . History of pneumonia 2009 and 2010  . Mild persistent asthma, well controlled 2014   New adult onset asthma after respiratory infection (Dr. Ishmael Holter)  . Morbid obesity ([place])    BMI 50+.  At one point in time she was going to Bariatric clinic on [address] and got weekly HCG injections, monthly vit B12 injections, and took phentermine daily.  As of 05/2015 she was no longer doing this.  . Nephrolithiasis 04/2018 first episode   [facility]ighbours [facility]): 1.32mm right UVJ stone + 6 mm L AML.  Pt elected for trial of  passage.  . Osteoarthritis of both knees    No improvement with steroid injections; did synvisc x 3 yrs; bilat replacement was recommended but she declined.  . Recurrent low back pain      Social History   Tobacco Use  Smoking Status Former Smoker  . Types: Cigarettes  . Last attempt to quit: 12/28/2009  . Years since quitting: 8.8  Smokeless Tobacco Never Used    Social History   Substance and Sexual Activity  Alcohol Use Yes   Comment: social     Allergies  Allergen Reactions  . Penicillins Nausea Only and Rash    Has patient had a PCN reaction causing immediate rash, facial/tongue/throat swelling, SOB or lightheadedness with hypotension: yES Has patient had a PCN reaction causing severe rash involving mucus membranes or skin necrosis: No Has patient had a PCN reaction that required hospitalization: No Has patient had a PCN reaction occurring within the last 10 years: No If all of the above answers are "NO", then may proceed with Cephalosporin use. CC    Current Outpatient Medications  Medication Sig Dispense Refill  . albuterol (PROVENTIL) (2.5 MG/3ML) 0.083% nebulizer solution Take 3 mLs (2.5 mg total) by nebulization every 6 (six) hours as needed for wheezing. 45 mL 1  . albuterol (VENTOLIN HFA) 108 (90 Base) MCG/ACT inhaler Inhale 2 puffs into the lungs every 4 (four) hours as needed for wheezing. 1 Inhaler 1  . ascorbic acid (VITAMIN C) 1000 MG tablet Take  1,000 mg by mouth daily.    Marland Kitchen BAYER MICROLET LANCETS lancets Use to check blood sugar once daily 100 each 11  . benzonatate (TESSALON) 200 MG capsule Take 1 capsule (200 mg total) by mouth 3 (three) times daily as needed for cough. 30 capsule 0  . Cholecalciferol (VITAMIN D-3 PO) Take 1 tablet by mouth daily.    Marland Kitchen doxycycline (VIBRA-TABS) 100 MG tablet Take 1 tablet (100 mg total) by mouth 2 (two) times daily for 19 days. 38 tablet 0  . glucose blood (CONTOUR NEXT TEST) test strip Use to check blood sugar once daily  100 each 11  . guaiFENesin-dextromethorphan (ROBITUSSIN DM) 100-10 MG/5ML syrup Take 5 mLs by mouth every 4 (four) hours as needed for cough. 118 mL 0  . meloxicam (MOBIC) 7.5 MG tablet 1-2 tabs po qd prn arthritis pain 60 tablet 6  . metFORMIN (GLUCOPHAGE) 1000 MG tablet 1 tab po bid with meals 60 tablet 6  . metroNIDAZOLE (FLAGYL) 500 MG tablet Take 1 tablet (500 mg total) by mouth 3 (three) times daily for 19 days. 57 tablet 0   No current facility-administered medications for this visit.        Physical Exam: BP 124/66   Pulse 85   Resp 20   Ht 5\' 7"  (1.702 m)   Wt (!) 339 lb (153.8 kg)   SpO2 96% Comment: 1.5L O2 per [place]  BMI 53.09 kg/m   General appearance: alert, cooperative and no distress Neurologic: intact Heart: regular rate and rhythm, S1, S2 normal, no murmur, click, rub or gallop Lungs: diminished breath sounds LLL Abdomen: soft, non-tender; bowel sounds normal; no masses,  no organomegaly Extremities: extremities normal, atraumatic, no cyanosis or edema and Homans sign is negative, no sign of DVT Wound: Left chest tube sites, and incision are healing well without evidence of infection   Diagnostic Studies & Laboratory data:     Recent Radiology Findings:   Dg Chest 2 View  Result Date: 11/15/2018 CLINICAL DATA:  S/P VATS / thoracotomy 10/24/18, patient complains of SOB and fatigue , asthma (patient going to [facility] now) EXAM: CHEST - 2 VIEW COMPARISON:  10/31/2018 FINDINGS: Heart margins are partially obscured. Suspect mild cardiomegaly. There is persistent opacity at the LEFT lung base, consistent atelectasis or consolidation and probable pleural effusion. There has been slight interval improvement in aeration. IMPRESSION: Improving aeration at the LEFT lung base. Electronically Signed   By: Nolon Nations M.D.   On: 11/15/2018 11:06    I have independently reviewed the above radiology studies  and reviewed the findings with the patient.   Recent Lab  Findings: Lab Results  Component Value Date   WBC 13.0 (H) 10/31/2018   HGB 8.9 (L) 10/31/2018   HCT 29.0 (L) 10/31/2018   PLT 447 (H) 10/31/2018   GLUCOSE 97 10/30/2018   CHOL 199 06/20/2017   TRIG 148 10/24/2018   HDL 84.00 06/20/2017   LDLCALC 92 06/20/2017   ALT 25 10/26/2018   AST 20 10/26/2018   NA 140 10/30/2018   K 3.4 (L) 10/30/2018   CL 103 10/30/2018   CREATININE 0.73 10/30/2018   BUN <5 (L) 10/30/2018   CO2 30 10/30/2018   TSH 1.09 06/20/2017   HGBA1C 5.6 10/10/2018      Assessment / Plan:      Patient slowly recovering after recent left lower lobe pneumonia with associated empyema and complex pleural effusion ultimately requiring left VATS, minithoracotomy for drainage. The patient appears to be  slowly improving she denies fever and chills, she remains on home oxygen but notes that she has been able to be comfortable off oxygen at rest.  I reviewed her x-ray with her.  Patient notes that she was to go on a cruise December 13.  After examining her today reviewing her x-ray and knowing her recent course I told her it would be poor for her recovery to travel for at least 3 months after surgery.  We have written her a letter to this effect so she can change her travel arrangements.  I plan to see her back in 3 weeks for follow-up chest x-ray      Grace Isaac MD      [address] [place] [address] Office [phone]   Beeper [id number]  11/15/2018 11:30 AM

## 2018-11-19 ENCOUNTER — Ambulatory Visit (INDEPENDENT_AMBULATORY_CARE_PROVIDER_SITE_OTHER): Payer: Self-pay | Admitting: Nurse Practitioner

## 2018-11-19 ENCOUNTER — Ambulatory Visit (INDEPENDENT_AMBULATORY_CARE_PROVIDER_SITE_OTHER)
Admission: RE | Admit: 2018-11-19 | Discharge: 2018-11-19 | Disposition: A | Discharge status: Home / Self Care | Payer: Self-pay | Source: Ambulatory Visit | Attending: Nurse Practitioner | Admitting: Nurse Practitioner

## 2018-11-19 ENCOUNTER — Encounter: Payer: Self-pay | Admitting: Nurse Practitioner

## 2018-11-19 VITALS — BP 130/70 | HR 80 | Ht 67.0 in | Wt 328.6 lb

## 2018-11-19 DIAGNOSIS — R0683 Snoring: Secondary | ICD-10-CM

## 2018-11-19 DIAGNOSIS — J189 Pneumonia, unspecified organism: Secondary | ICD-10-CM

## 2018-11-19 DIAGNOSIS — Z6841 Body Mass Index (BMI) 40.0 and over, adult: Secondary | ICD-10-CM

## 2018-11-19 NOTE — Progress Notes (Signed)
@Patient  ID: Taylor Leon, female    DOB: 1956/05/27, 62 y.o.   MRN: [id number]  Chief Complaint  Patient presents with  . Hospitalization Follow-up    Referring provider: Tammi Sou, MD   HPI 62 year old female with mild persistent asthma followed by Dr. Elsworth Soho.  Tests:  CXR 11/20/18 - Improving aeration at the LEFT lung base.  CXR 10/31/18 - There is persistent increased density in the left mid and lower lung with obscuration of the left hemidiaphragm and portions of the left heart border. The right lung is adequately inflated and clear. The interstitial markings of the aerated portions of both lungs remain Increased.  OV 11/20/18 - hospital follow up Patient presents for a follow up after hospital admission. She was admitted on 10/19/2018 with pneumonia and a small parapneumonic effusion.  She had left thoracentesis on 10/28 and repeat CT scan showed progression of left effusion and left upper and lower lobes consistent with empyema.  She was transferred to [facility] on 10/30 and underwent a L VATS, mini thoracotomy for drainage.  She was discharged home with oral antibiotics and has completed those.  Patient states that she is slowly recovering.  Denies any fever or chills remains on home oxygen.  She has followed up with cardiothoracic surgery.  Dr. Pia Mau will see her back in 2 weeks for follow-up chest x-ray.  While in the hospital she was seen by Dr. Elsworth Soho.  He noted that she most likely has underlying OSA and wanted a home sleep study set up as an outpatient.     Allergies  Allergen Reactions  . Penicillins Nausea Only and Rash    Has patient had a PCN reaction causing immediate rash, facial/tongue/throat swelling, SOB or lightheadedness with hypotension: yES Has patient had a PCN reaction causing severe rash involving mucus membranes or skin necrosis: No Has patient had a PCN reaction that required hospitalization: No Has patient had a PCN reaction occurring within the  last 10 years: No If all of the above answers are "NO", then may proceed with Cephalosporin use. CC    Immunization History  Administered Date(s) Administered  . Influenza,inj,Quad PF,6+ Mos 08/29/2013, 09/10/2015, 12/06/2016, 11/02/2017, 10/10/2018  . PPD Test 01/27/2017  . Tdap 06/05/2015  . Zoster Recombinat (Shingrix) 06/20/2017, 08/30/2017    Past Medical History:  Diagnosis Date  . BPPV (benign paroxysmal positional vertigo) 01/30/2014  . CAP (community acquired pneumonia) 10/2018   with acute hypoxic RF, empyema-->VATS  . Cyst, breast 01/2012; 06/2012; 07/02/28; 04/30/20   Complicated cysts in upper outer left breast--no evidence of malignancy--f/u bilat diag mammo/left breast u/s on 03/06/15 showed resolution of left breast cysts.  Repeat screening mammogram 1 yr.  . Diabetes mellitus without complication (HCC)    Pre-diabetes  . Empyema ([place])    drained by CT surgery/VATS.  Marland Kitchen H/O allergic rhinitis    Dr. Ishmael Holter  . History of pneumonia 2009 and 2010  . Mild persistent asthma, well controlled 2014   New adult onset asthma after respiratory infection (Dr. Ishmael Holter)  . Morbid obesity ([place])    BMI 50+.  At one point in time she was going to Bariatric clinic on [address] and got weekly HCG injections, monthly vit B12 injections, and took phentermine daily.  As of 05/2015 she was no longer doing this.  . Nephrolithiasis 04/2018 first episode   Dr. Lovena Neighbours ([facility]): 1.19mm right UVJ stone + 6 mm L AML.  Pt elected for trial of passage.  Marland Kitchen  Osteoarthritis of both knees    No improvement with steroid injections; did synvisc x 3 yrs; bilat replacement was recommended but she declined.  . Recurrent low back pain     Tobacco History: Social History   Tobacco Use  Smoking Status Former Smoker  . Types: Cigarettes  . Last attempt to quit: 12/28/2009  . Years since quitting: 8.9  Smokeless Tobacco Never Used   Counseling given: Yes   Outpatient Encounter Medications as of 11/19/2018    Medication Sig  . albuterol (PROVENTIL) (2.5 MG/3ML) 0.083% nebulizer solution Take 3 mLs (2.5 mg total) by nebulization every 6 (six) hours as needed for wheezing.  Marland Kitchen albuterol (VENTOLIN HFA) 108 (90 Base) MCG/ACT inhaler Inhale 2 puffs into the lungs every 4 (four) hours as needed for wheezing.  Marland Kitchen ascorbic acid (VITAMIN C) 1000 MG tablet Take 1,000 mg by mouth daily.  Marland Kitchen BAYER MICROLET LANCETS lancets Use to check blood sugar once daily  . benzonatate (TESSALON) 200 MG capsule Take 1 capsule (200 mg total) by mouth 3 (three) times daily as needed for cough.  . Cholecalciferol (VITAMIN D-3 PO) Take 1 tablet by mouth daily.  Marland Kitchen doxycycline (VIBRA-TABS) 100 MG tablet Take 1 tablet (100 mg total) by mouth 2 (two) times daily for 19 days.  Marland Kitchen glucose blood (CONTOUR NEXT TEST) test strip Use to check blood sugar once daily  . guaiFENesin-dextromethorphan (ROBITUSSIN DM) 100-10 MG/5ML syrup Take 5 mLs by mouth every 4 (four) hours as needed for cough.  . meloxicam (MOBIC) 7.5 MG tablet 1-2 tabs po qd prn arthritis pain  . metFORMIN (GLUCOPHAGE) 1000 MG tablet 1 tab po bid with meals  . metroNIDAZOLE (FLAGYL) 500 MG tablet Take 1 tablet (500 mg total) by mouth 3 (three) times daily for 19 days.   No facility-administered encounter medications on file as of 11/19/2018.      Review of Systems  Review of Systems  Constitutional: Negative.  Negative for chills and fever.  HENT: Negative.   Respiratory: Positive for shortness of breath. Negative for cough.   Cardiovascular: Negative.  Negative for chest pain, palpitations and leg swelling.  Gastrointestinal: Negative.   Allergic/Immunologic: Negative.   Neurological: Negative.   Psychiatric/Behavioral: Negative.        Physical Exam  BP 130/70 (BP Location: Left Arm, Patient Position: Sitting, Cuff Size: Large)   Pulse 80   Ht 5\' 7"  (1.702 m)   Wt (!) 328 lb 9.6 oz (149.1 kg)   SpO2 96% Comment: on 1.5 L of O2  BMI 51.47 kg/m   Wt  Readings from Last 5 Encounters:  11/19/18 (!) 328 lb 9.6 oz (149.1 kg)  11/15/18 (!) 339 lb (153.8 kg)  11/09/18 (!) 331 lb 6 oz (150.3 kg)  11/05/18 (!) 325 lb (147.4 kg)  10/27/18 (!) 337 lb 11.2 oz (153.2 kg)     Physical Exam  Constitutional: She is oriented to person, place, and time. She appears well-developed and well-nourished. No distress.  Cardiovascular: Normal rate and regular rhythm.  Pulmonary/Chest: Effort normal and breath sounds normal. No respiratory distress. She has no wheezes.  Musculoskeletal: She exhibits no edema.  Neurological: She is alert and oriented to person, place, and time.  Skin:  Left chest tube sites and incision are healing well without evidence of infection.   Psychiatric: She has a normal mood and affect.  Nursing note and vitals reviewed.    Imaging: Dg Chest 1 View  Result Date: 10/23/2018 CLINICAL DATA:  Shortness of  Breath EXAM: CHEST  1 VIEW COMPARISON:  October 22, 2018 FINDINGS: There is extensive airspace consolidation throughout virtually all of the left lung. The right lung is clear. There is cardiomegaly. The pulmonary vascularity on the right appears normal. Pulmonary vascular on the left is obscured. There is no adenopathy in areas that can be assessed for potential adenopathy. IMPRESSION: Diffuse opacity throughout the left lung, likely due to a combination of consolidation and effusion. There may be slightly more consolidation on the left compared to 1 day prior. Right lung is clear.  Cardiac enlargement is stable. Electronically Signed   By: Lowella Grip III M.D.   On: 10/23/2018 07:54   Dg Chest 1 View  Result Date: 10/22/2018 CLINICAL DATA:  Status post left thoracentesis EXAM: CHEST  1 VIEW COMPARISON:  10/22/2018 FINDINGS: Large left pleural effusion is again identified. There continues to only be a small aerated segment of the left lung apex, similar or mildly increased in size compared to prior. No appreciable  pneumothorax. Cardiomegaly is present. The right lung appears clear. IMPRESSION: 1. Large left pleural effusion is again observed. No pneumothorax. Small amount of aerated lung at the left lung apex. This aerated lung may be slightly more than on the 10/22/2018 exam at 1310 hours. 2. Cardiomegaly. Electronically Signed   By: Van Clines M.D.   On: 10/22/2018 16:21   Dg Chest 2 View  Result Date: 11/19/2018 CLINICAL DATA:  Community-acquired pneumonia.  Short of breath EXAM: CHEST - 2 VIEW COMPARISON:  11/15/2018 FINDINGS: Left lower lobe airspace disease and left pleural effusion unchanged. Right lung remains clear. No right effusion. Negative for heart failure or edema. IMPRESSION: Persistent left lower lobe consolidation and effusion unchanged. Electronically Signed   By: Franchot Gallo M.D.   On: 11/19/2018 17:15   Dg Chest 2 View  Result Date: 11/15/2018 CLINICAL DATA:  S/P VATS / thoracotomy 10/24/18, patient complains of SOB and fatigue , asthma (patient going to [facility] now) EXAM: CHEST - 2 VIEW COMPARISON:  10/31/2018 FINDINGS: Heart margins are partially obscured. Suspect mild cardiomegaly. There is persistent opacity at the LEFT lung base, consistent atelectasis or consolidation and probable pleural effusion. There has been slight interval improvement in aeration. IMPRESSION: Improving aeration at the LEFT lung base. Electronically Signed   By: Nolon Nations M.D.   On: 11/15/2018 11:06   Dg Chest 2 View  Result Date: 10/31/2018 CLINICAL DATA:  Shortness of breath, recent evacuation of empyema and decortication on the left. Recent removal of all drainage tubes. EXAM: CHEST - 2 VIEW COMPARISON:  PA and lateral chest x-ray of October 28, 2018 FINDINGS: There is persistent increased density in the left mid and lower lung with obscuration of the left hemidiaphragm and portions of the left heart border. The right lung is adequately inflated and clear. The interstitial markings of the aerated  portions of both lungs remain increased. The bony thorax exhibits no acute abnormality. IMPRESSION: Increased density in the lower 1/2 of the left lung since the earlier study worrisome for worsening atelectasis or pneumonia. Persistent pleural fluid versus pleural thickening along the left lateral pleural surface. No mediastinal shift. Stable appearing right lung. Electronically Signed   By: David  Martinique M.D.   On: 10/31/2018 10:31   Dg Chest 2 View  Result Date: 10/28/2018 CLINICAL DATA:  Pleural effusion EXAM: CHEST - 2 VIEW COMPARISON:  Radiograph 10/27/2018 FINDINGS: Stable enlarged cardiac silhouette. Interval removal of LEFT chest tube. No pneumothorax appreciated. Dense LEFT basilar atelectasis  remains. Effusion seen along the lateral LEFT chest wall. RIGHT lung clear. IMPRESSION: Rule LEFT chest tube without pneumothorax appreciated Dense LEFT basilar atelectasis and effusion. Electronically Signed   By: Suzy Bouchard M.D.   On: 10/28/2018 08:25   Dg Chest 2 View  Result Date: 10/22/2018 CLINICAL DATA:  Dyspnea. EXAM: CHEST - 2 VIEW COMPARISON:  Radiographs of August 19, 2018. FINDINGS: Interval development of large left pleural effusion is noted with underlying atelectasis or infiltrate. No significant mediastinal shift is noted. Stable cardiomediastinal silhouette. No pneumothorax is noted. Right lung is clear. Bony thorax is unremarkable. IMPRESSION: Interval development of large left pleural effusion with underlying atelectasis or infiltrate. No significant mediastinal shift is noted. Electronically Signed   By: Marijo Conception, M.D.   On: 10/22/2018 14:25   Ct Chest Wo Contrast  Result Date: 10/23/2018 CLINICAL DATA:  Dyspnea. EXAM: CT CHEST WITHOUT CONTRAST TECHNIQUE: Multidetector CT imaging of the chest was performed following the standard protocol without IV contrast. COMPARISON:  Radiograph of October 23, 2018. CT scan of October 19, 2018. FINDINGS: Cardiovascular: No evidence of  thoracic aortic aneurysm. Mild cardiomegaly is noted. No pericardial effusion is noted. Mediastinum/Nodes: No enlarged mediastinal or axillary lymph nodes. Thyroid gland, trachea, and esophagus demonstrate no significant findings. Lungs/Pleura: No pneumothorax is noted. Mild right basilar subsegmental atelectasis is noted. Left pleural effusion is significantly larger and appears to be loculated, with atelectasis of the left lower lobe and pneumonia or atelectasis of the left upper lobe. Upper Abdomen: Stable probable hepatic cysts are noted. Musculoskeletal: No chest wall mass or suspicious bone lesions identified. IMPRESSION: Left pleural effusion is significantly enlarged compared to prior exam and now appears to be loculated, with associated atelectasis or pneumonia involving the left upper and lower lobes. This is concerning for possible empyema. Electronically Signed   By: Marijo Conception, M.D.   On: 10/23/2018 11:41   Dg Chest Port 1 View  Result Date: 10/27/2018 CLINICAL DATA:  Chest tube. EXAM: PORTABLE CHEST 1 VIEW COMPARISON:  One-view chest x-ray 10/26/2018 FINDINGS: The heart is enlarged. One of the left-sided chest tube was removed. Left pleural effusion remains. There is no pneumothorax. Right IJ line is stable. Left basilar airspace disease is unchanged. Mild atelectasis at the right base is slightly improved. IMPRESSION: 1. Interval removal of 1 left-sided chest tube without pneumothorax. 2. Stable left pleural effusion and basilar airspace disease. While this likely reflects atelectasis, infection is not excluded. 3. Slight improved atelectasis at the right base. Electronically Signed   By: San Morelle M.D.   On: 10/27/2018 09:27   Dg Chest Port 1 View  Result Date: 10/26/2018 CLINICAL DATA:  Chest tube.  Sore chest. EXAM: PORTABLE CHEST 1 VIEW COMPARISON:  10/25/2018. FINDINGS: Right IJ line and left chest tubes in stable position. No pneumothorax. Stable cardiomegaly. Unchanged  bibasilar atelectasis. Unchanged left-sided pleural thickening. IMPRESSION: 1. Right IJ line and left chest tubes in stable position. No pneumothorax. 2.  Stable bibasilar atelectasis and left-sided pleural effusion. 3.  Stable cardiomegaly. Electronically Signed   By: Marcello Moores  Register   On: 10/26/2018 08:51   Dg Chest Port 1 View  Result Date: 10/25/2018 CLINICAL DATA:  Status post VATS on the left EXAM: PORTABLE CHEST 1 VIEW COMPARISON:  10/24/2018 FINDINGS: Cardiac shadow remains enlarged. Endotracheal tube, nasogastric catheter and right jugular central line are again seen and stable. Two chest tubes are noted on the left without evidence of pneumothorax. Residual left pleural effusion is again  identified and stable. Persistent increased left retrocardiac density is noted. The right lung remains clear. IMPRESSION: Stable appearance of left empyema and lower lobe consolidation. Tubes and lines as described. Electronically Signed   By: Inez Catalina M.D.   On: 10/25/2018 07:55   Dg Chest Port 1 View  Result Date: 10/24/2018 CLINICAL DATA:  Endotracheal tube placement. EXAM: PORTABLE CHEST 1 VIEW COMPARISON:  Chest radiograph and CT of the chest performed 10/23/2018 FINDINGS: The patient's endotracheal tube is seen ending 5-6 cm above the carina. A right IJ line is noted ending about the proximal to mid SVC. A persistent large loculated left-sided pleural effusion is again noted, with underlying airspace consolidation, concerning for pneumonia and empyema, as noted on the prior CT. The right lung appears relatively clear. No pneumothorax is seen The cardiomediastinal silhouette is mildly enlarged. No acute osseous abnormalities are identified. IMPRESSION: 1. Endotracheal tube seen ending 5-6 cm above the carina. 2. Persistent large loculated left-sided pleural effusion, with underlying airspace consolidation, concerning for pneumonia and empyema, as noted on the prior CT. 3. Mild cardiomegaly.  Electronically Signed   By: Garald Balding M.D.   On: 10/24/2018 15:30   Dg Abd Portable 1v  Result Date: 10/25/2018 CLINICAL DATA:  62 year old female with advancement of enteric tube. EXAM: PORTABLE ABDOMEN - 1 VIEW COMPARISON:  Area radiograph dated 10/25/2018 FINDINGS: Interval advancement of enteric tube with side port and tip in the body of the stomach. Air is noted in the colon. Right upper quadrant cholecystectomy clips. Partially visualized left lung base opacity. IMPRESSION: Enteric tube with side port and tip in the body of the stomach. Electronically Signed   By: Anner Crete M.D.   On: 10/25/2018 02:11   Dg Abd Portable 1v  Result Date: 10/25/2018 CLINICAL DATA:  62 year old female status post NG tube placement. EXAM: PORTABLE ABDOMEN - 1 VIEW COMPARISON:  Chest radiograph dated 10/24/2018 FINDINGS: Partially visualized enteric tube with side port in the region of the gastroesophageal junction and tip in the proximal stomach. Recommend further advancing the tube into the stomach by an additional 14 cm. Air is noted within the colon. Left lung base opacity as seen on the earlier radiograph. IMPRESSION: Enteric tube with side port at the GE junction. Recommend further advancing of the tube into the stomach. Electronically Signed   By: Anner Crete M.D.   On: 10/25/2018 00:56    US Thoracentesis Asp Pleural Space W/img Guide  Result Date: 10/22/2018 INDICATION: 62 year old female with symptomatic left pleural effusion. EXAM: ULTRASOUND GUIDED LEFT THORACENTESIS MEDICATIONS: None. COMPLICATIONS: None immediate. PROCEDURE: An ultrasound guided thoracentesis was thoroughly discussed with the patient and questions answered. The benefits, risks, alternatives and complications were also discussed. The patient understands and wishes to proceed with the procedure. Written consent was obtained. Ultrasound was performed to localize and mark an adequate pocket of fluid in the left chest. The  area was then prepped and draped in the normal sterile fashion. 1% Lidocaine was used for local anesthesia. Under ultrasound guidance a 6 Fr Safe-T-Centesis catheter was introduced. Thoracentesis was performed. The catheter was removed and a dressing applied. FINDINGS: A total of approximately 970 mL of yellow pleural fluid was removed. Samples were sent to the laboratory as requested by the clinical team. IMPRESSION: Successful ultrasound guided left thoracentesis yielding 970 mL of pleural fluid. Electronically Signed   By: Jacqulynn Cadet M.D.   On: 10/22/2018 16:31     Assessment & Plan:   CAP (community acquired pneumonia)  Patient is slowly recovering Continue current medications Continue oxygen  Patient Instructions  Will recheck chest x ray ad call with results Continue current medications O2 at 1L continuously and 1-2 L with exertion Will order HST Will order pulmonary rehab Follow up with Dr. Elsworth Soho at first available appointment within next 4 weeks     Morbid obesity with BMI of 50.0-59.9, adult [facility]) Concern for OSA per Dr. Bari Mantis note from hospital We will order home sleep study     Fenton Foy, NP 11/20/2018

## 2018-11-19 NOTE — Patient Instructions (Addendum)
Will recheck chest x ray ad call with results Continue current medications O2 at 1L continuously and 1-2 L with exertion Will order HST Will order pulmonary rehab Follow up with Dr. Elsworth Soho at first available appointment within next 4 weeks

## 2018-11-20 ENCOUNTER — Encounter: Payer: Self-pay | Admitting: Nurse Practitioner

## 2018-11-20 DIAGNOSIS — Z6841 Body Mass Index (BMI) 40.0 and over, adult: Secondary | ICD-10-CM

## 2018-11-20 NOTE — Assessment & Plan Note (Signed)
Concern for OSA per Dr. Bari Mantis note from hospital We will order home sleep study

## 2018-11-20 NOTE — Assessment & Plan Note (Signed)
Patient is slowly recovering Continue current medications Continue oxygen  Patient Instructions  Will recheck chest x ray ad call with results Continue current medications O2 at 1L continuously and 1-2 L with exertion Will order HST Will order pulmonary rehab Follow up with Dr. Elsworth Soho at first available appointment within next 4 weeks

## 2018-11-21 ENCOUNTER — Telehealth: Payer: Self-pay | Admitting: Nurse Practitioner

## 2018-11-21 NOTE — Telephone Encounter (Signed)
Called and spoke with patient, she is requesting results from cxr. [name] please advise, thank you.

## 2018-11-23 LAB — FUNGUS CULTURE WITH STAIN

## 2018-11-23 LAB — FUNGAL ORGANISM REFLEX

## 2018-11-23 LAB — FUNGUS CULTURE RESULT

## 2018-11-23 NOTE — Telephone Encounter (Signed)
Advised pt of results. She states she is out of the antibiotic and wants to know if she should be taking more abx? [name] please advise.    Notes recorded by Fenton Foy, NP on 11/21/2018 at 11:33 AM EST Please call to let patient know that her chest x ray was unchanged. Please keep upcoming appointment with CT surgery

## 2018-11-23 NOTE — Telephone Encounter (Signed)
This patient has already been called about her x ray results. She needs to follow up with CT surgery. They want to do another xray in about a week or so and will decide if she needs further treatment at that time per their last note.

## 2018-11-23 NOTE — Telephone Encounter (Signed)
Pt is calling back [phone]

## 2018-11-23 NOTE — Telephone Encounter (Signed)
Spoke with pt again, she wanted to know if we were going to put the xray in because she needs one before her appt. I dont see an xray order in there. Is Dr., Eliseo Squires supposed to out this order in? Please advise.

## 2018-11-23 NOTE — Telephone Encounter (Signed)
Called pt and advised message from the provider. Pt understood and verbalized understanding. Nothing further is needed.    

## 2018-11-24 ENCOUNTER — Encounter: Payer: Self-pay | Admitting: Family Medicine

## 2018-11-26 NOTE — Telephone Encounter (Signed)
[name], please advise if we are supposed to be ordering the xray for Taylor Leon to be done in about a week from her last OV with you or if another doctor was going to be ordering this for her. I do see where Taylor Leon has a follow up appt with Dr. Servando Snare for a 3 week follow up with a cxr but want to make sure that they are the ones doing the cxr and that we do not need to do anything else for Taylor Leon at this time. Please advise, thanks!

## 2018-11-26 NOTE — Telephone Encounter (Signed)
CT surgery wants to see her back and they will do the xray. Nothing else at this time.

## 2018-12-06 ENCOUNTER — Ambulatory Visit (INDEPENDENT_AMBULATORY_CARE_PROVIDER_SITE_OTHER): Payer: Self-pay | Admitting: Cardiothoracic Surgery

## 2018-12-06 ENCOUNTER — Other Ambulatory Visit: Payer: Self-pay | Admitting: Cardiothoracic Surgery

## 2018-12-06 ENCOUNTER — Ambulatory Visit
Admission: RE | Admit: 2018-12-06 | Discharge: 2018-12-06 | Disposition: A | Discharge status: Home / Self Care | Payer: Self-pay | Source: Ambulatory Visit | Attending: Cardiothoracic Surgery | Admitting: Cardiothoracic Surgery

## 2018-12-06 VITALS — BP 130/87 | HR 80 | Resp 20 | Ht 67.0 in | Wt 323.0 lb

## 2018-12-06 DIAGNOSIS — J869 Pyothorax without fistula: Secondary | ICD-10-CM

## 2018-12-06 DIAGNOSIS — Z9889 Other specified postprocedural states: Secondary | ICD-10-CM

## 2018-12-06 DIAGNOSIS — Z09 Encounter for follow-up examination after completed treatment for conditions other than malignant neoplasm: Secondary | ICD-10-CM

## 2018-12-06 NOTE — Progress Notes (Signed)
Taylor Leon       [place] [address]             [phone]      [name] [place] Medical Record #[id number] Date of Birth: [birth year]  Referring: Tammi Sou, MD Primary Care: Tammi Sou, MD Primary Cardiologist: No primary care provider on file.   Chief Complaint:   POST OP FOLLOW UP OPERATIVE REPORT DATE OF PROCEDURE: 10/24/2018 PREOPERATIVE DIAGNOSIS: Left chest empyema. POSTOPERATIVE DIAGNOSIS: Left chest empyema. SURGICAL PROCEDURE: Bronchoscopy, left video-assisted thoracoscopy, mini thoracotomy, drainage of empyema and decortication. SURGEON: Lanelle Bal, MD  History of Present Illness:     Patient underwent left video-assisted thoracoscopy minithoracotomy decortication drainage of empyema November 30.  Patient continues to slowly improve.  She notes that she is now using oxygen at home less.  Still checks her O2 sats.  She is using oxygen at night.  Her ability to move around and exert herself without shortness of breath has improved.  She denies fevers or chills.    Past Medical History:  Diagnosis Date  . BPPV (benign paroxysmal positional vertigo) 01/30/2014  . CAP (community acquired pneumonia) 10/2018   with acute hypoxic RF, empyema-->VATS  . Cyst, breast 01/2012; 06/2012; 06/29/16; 0/0/17   Complicated cysts in upper outer left breast--no evidence of malignancy--f/u bilat diag mammo/left breast u/s on 03/06/15 showed resolution of left breast cysts.  Repeat screening mammogram 1 yr.  . Diabetes mellitus without complication (HCC)    Pre-diabetes  . Empyema ([place])    drained by CT surgery/VATS.  Marland Kitchen H/O allergic rhinitis    Dr. Ishmael Holter  . History of pneumonia 2009 and 2010  . Mild persistent asthma, well controlled 2014   New adult onset asthma after respiratory infection (Dr. Ishmael Holter)  . Morbid obesity ([place])    BMI 50+.  At one point in time she was going to Bariatric clinic on [address] and got weekly HCG  injections, monthly vit B12 injections, and took phentermine daily.  As of 05/2015 she was no longer doing this.  . Nephrolithiasis 04/2018 first episode   [facility] 1.3mm right UVJ stone + 6 mm L AML.  Pt elected for trial of passage.  . OSA (obstructive sleep apnea) 10/2018   Suspected-->pulm ordered home sleep study as of 10/2018 hosp f/u.  . Osteoarthritis of both knees    No improvement with steroid injections; did synvisc x 3 yrs; bilat replacement was recommended but she declined.  . Recurrent low back pain      Social History   Tobacco Use  Smoking Status Former Smoker  . Types: Cigarettes  . Last attempt to quit: 12/28/2009  . Years since quitting: 8.9  Smokeless Tobacco Never Used    Social History   Substance and Sexual Activity  Alcohol Use Yes   Comment: social     Allergies  Allergen Reactions  . Penicillins Nausea Only and Rash    Has patient had a PCN reaction causing immediate rash, facial/tongue/throat swelling, SOB or lightheadedness with hypotension: yES Has patient had a PCN reaction causing severe rash involving mucus membranes or skin necrosis: No Has patient had a PCN reaction that required hospitalization: No Has patient had a PCN reaction occurring within the last 10 years: No If all of the above answers are "NO", then may proceed with Cephalosporin use. CC    Current Outpatient Medications  Medication Sig Dispense Refill  . albuterol (PROVENTIL) (  2.5 MG/3ML) 0.083% nebulizer solution Take 3 mLs (2.5 mg total) by nebulization every 6 (six) hours as needed for wheezing. 45 mL 1  . albuterol (VENTOLIN HFA) 108 (90 Base) MCG/ACT inhaler Inhale 2 puffs into the lungs every 4 (four) hours as needed for wheezing. 1 Inhaler 1  . ascorbic acid (VITAMIN C) 1000 MG tablet Take 1,000 mg by mouth daily.    Marland Kitchen BAYER MICROLET LANCETS lancets Use to check blood sugar once daily 100 each 11  . Cholecalciferol (VITAMIN D-3 PO) Take 1 tablet by mouth  daily.    Marland Kitchen glucose blood (CONTOUR NEXT TEST) test strip Use to check blood sugar once daily 100 each 11  . guaiFENesin-dextromethorphan (ROBITUSSIN DM) 100-10 MG/5ML syrup Take 5 mLs by mouth every 4 (four) hours as needed for cough. 118 mL 0  . meloxicam (MOBIC) 7.5 MG tablet 1-2 tabs po qd prn arthritis pain 60 tablet 6  . metFORMIN (GLUCOPHAGE) 1000 MG tablet 1 tab po bid with meals 60 tablet 6   No current facility-administered medications for this visit.        Physical Exam: BP 130/87   Pulse 80   Resp 20   Ht 5\' 7"  (1.702 m)   Wt (!) 323 lb (146.5 kg)   SpO2 97% Comment: RA  BMI 50.59 kg/m   General appearance: alert, cooperative and appears stated age Neck: no adenopathy, no carotid bruit, no JVD, supple, symmetrical, trachea midline and thyroid not enlarged, symmetric, no tenderness/mass/nodules Lymph nodes: Cervical, supraclavicular, and axillary nodes normal. Resp: diminished breath sounds LLL Cardio: regular rate and rhythm, S1, S2 normal, no murmur, click, rub or gallop GI: soft, non-tender; bowel sounds normal; no masses,  no organomegaly Extremities: extremities normal, atraumatic, no cyanosis or edema Neurologic: Grossly normal  Diagnostic Studies & Laboratory data:     Recent Radiology Findings:   Dg Chest 2 View  Result Date: 12/06/2018 CLINICAL DATA:  History of VATS/empyema.  Shortness of breath. EXAM: CHEST - 2 VIEW COMPARISON:  11/19/2018. FINDINGS: Mediastinum hilar structures normal. Stable cardiomegaly with normal pulmonary vascularity. Left-sided pleural thickening/effusion with underlying left base atelectasis/infiltrate. Slight improvement from prior exam. No pneumothorax. IMPRESSION: Left-sided pleural thickening/effusion with underlying left base atelectasis/infiltrate. Slight improvement from prior exam. No new abnormality identified. Electronically Signed   By: Marcello Moores  Register   On: 12/06/2018 13:40    I have independently reviewed the above  radiology studies  and reviewed the findings with the patient.   Recent Lab Findings: Lab Results  Component Value Date   WBC 13.0 (H) 10/31/2018   HGB 8.9 (L) 10/31/2018   HCT 29.0 (L) 10/31/2018   PLT 447 (H) 10/31/2018   GLUCOSE 97 10/30/2018   CHOL 199 06/20/2017   TRIG 148 10/24/2018   HDL 84.00 06/20/2017   LDLCALC 92 06/20/2017   ALT 25 10/26/2018   AST 20 10/26/2018   NA 140 10/30/2018   K 3.4 (L) 10/30/2018   CL 103 10/30/2018   CREATININE 0.73 10/30/2018   BUN <5 (L) 10/30/2018   CO2 30 10/30/2018   TSH 1.09 06/20/2017   HGBA1C 5.6 10/10/2018      Assessment / Plan:   Patient continues to slowly recover after recent left lower lobe pneumonia with associated empyema and complex effusion ultimately requiring left VATS minithoracotomy for drainage Overall physical stamina is improving and need from oxygen supplement decreasing. I reviewed her x-ray with her. Plan to see her back in 4 to 5 weeks with a follow-up  chest x-ray    Grace Isaac MD      [place],[place] [address] Office [phone]   Beeper [phone]  12/06/2018 2:05 PM

## 2018-12-07 LAB — ACID FAST CULTURE WITH REFLEXED SENSITIVITIES (MYCOBACTERIA)
Acid Fast Culture: NEGATIVE
Acid Fast Culture: NEGATIVE
Acid Fast Culture: NEGATIVE
Acid Fast Culture: NEGATIVE

## 2018-12-07 NOTE — Addendum Note (Signed)
Addended by: Karmen Stabs on: 12/07/2018 03:05 PM   Modules accepted: Orders

## 2018-12-17 ENCOUNTER — Ambulatory Visit (INDEPENDENT_AMBULATORY_CARE_PROVIDER_SITE_OTHER): Payer: Self-pay | Admitting: Pulmonary Disease

## 2018-12-17 ENCOUNTER — Encounter: Payer: Self-pay | Admitting: Pulmonary Disease

## 2018-12-17 DIAGNOSIS — G4733 Obstructive sleep apnea (adult) (pediatric): Secondary | ICD-10-CM | POA: Insufficient documentation

## 2018-12-17 DIAGNOSIS — R0902 Hypoxemia: Secondary | ICD-10-CM

## 2018-12-17 DIAGNOSIS — J4520 Mild intermittent asthma, uncomplicated: Secondary | ICD-10-CM

## 2018-12-17 DIAGNOSIS — J869 Pyothorax without fistula: Secondary | ICD-10-CM

## 2018-12-17 NOTE — Addendum Note (Signed)
Addended by: Valerie Salts on: 12/17/2018 11:25 AM   Modules accepted: Orders

## 2018-12-17 NOTE — Assessment & Plan Note (Signed)
Given excessive daytime somnolence, narrow pharyngeal exam, witnessed apneas & loud snoring, obstructive sleep apnea is very likely & an overnight polysomnogram will be scheduled as a home study. The pathophysiology of obstructive sleep apnea , it's cardiovascular consequences & modes of treatment including CPAP were discused with the patient in detail & they evidenced understanding.  

## 2018-12-17 NOTE — Assessment & Plan Note (Signed)
Will discontinue oxygen today

## 2018-12-17 NOTE — Assessment & Plan Note (Signed)
Repeat chest x-ray in 1 month this can be done on her visit to [facility] or we can do it on follow-up visit

## 2018-12-17 NOTE — Progress Notes (Signed)
Subjective:    Patient ID: Taylor Leon, female    DOB: May 07, 1956, 62 y.o.   MRN: [id number]  HPI  62 year old morbidly obese woman who was admitted 10/19/2018 for pneumonia and parapneumonic effusion and underwent VATS thoracotomy She was also felt to have OSA during that admission and treated with CPAP during sleep post extubation   Chief Complaint  Patient presents with  . Follow-up    1 month f/u for PNA. States she is slowly getting her strength back. She is only using the O2 at night as advised her surgeon.     She has recovered well.  She has self discontinued oxygen and wonders if this can be discontinued so that she does not have to pay the oxygen bowels.  Oxygen saturation is 96% on room air today. Chest x-ray from 12/12 was reviewed which shows left lower lobe pleural thickening.  She reports a chronic cough and intermittent wheezing since she moved from [place] to [place] in 2012.  She underwent ENT evaluation which was negative.  She saw Dr. Ishmael Holter allergist and was diagnosed with "allergy induced asthma" and started on albuterol and Qvar inhaler which she self discontinued.  She had a flareup in 08/2018 requiring prednisone taper by her PCP.  She reports excessive daytime sleepiness and fatigue.  Her husband has OSA and has noted loud snoring  Past Medical History:  Diagnosis Date  . BPPV (benign paroxysmal positional vertigo) 01/30/2014  . CAP (community acquired pneumonia) 10/2018   with acute hypoxic RF, empyema-->VATS  . Cyst, breast 01/2012; 06/2012; 12/31/05; 03/01/09   Complicated cysts in upper outer left breast--no evidence of malignancy--f/u bilat diag mammo/left breast u/s on 03/06/15 showed resolution of left breast cysts.  Repeat screening mammogram 1 yr.  . Diabetes mellitus without complication (HCC)    Pre-diabetes  . Empyema ([place])    drained by CT surgery/VATS.  Marland Kitchen H/O allergic rhinitis    Dr. Ishmael Holter  . History of pneumonia 2009 and 2010  . Mild  persistent asthma, well controlled 2014   New adult onset asthma after respiratory infection (Dr. Ishmael Holter)  . Morbid obesity ([place])    BMI 50+.  At one point in time she was going to Bariatric clinic on [address] and got weekly HCG injections, monthly vit B12 injections, and took phentermine daily.  As of 05/2015 she was no longer doing this.  . Nephrolithiasis 04/2018 first episode   Dr. Lov[facility] Neighbours ([facility]): 1.70mm right UVJ stone + 6 mm L AML.  Pt elected for trial of passage.  . OSA (obstructive sleep apnea) 10/2018   Suspected-->pulm ordered home sleep study as of 10/2018 hosp f/u.  . Osteoarthritis of both knees    No improvement with steroid injections; did synvisc x 3 yrs; bilat replacement was recommended but she declined.  . Recurrent low back pain       Review of Systems neg for any significant sore throat, dysphagia, itching, sneezing, nasal congestion or excess/ purulent secretions, fever, chills, sweats, unintended wt loss, pleuritic or exertional cp, hempoptysis, orthopnea pnd or change in chronic leg swelling. Also denies presyncope, palpitations, heartburn, abdominal pain, nausea, vomiting, diarrhea or change in bowel or urinary habits, dysuria,hematuria, rash, arthralgias, visual complaints, headache, numbness weakness or ataxia.     Objective:   Physical Exam   Gen. Pleasant, obese, in no distress, normal affect ENT - no pallor,icterus, no post nasal drip, class 2-3 airway Neck: No JVD, no thyromegaly, no carotid bruits Lungs: no  use of accessory muscles, no dullness to percussion, decreased without rales or rhonchi  Cardiovascular: Rhythm regular, heart sounds  normal, no murmurs or gallops, no peripheral edema Abdomen: soft and non-tender, no hepatosplenomegaly, BS normal. Musculoskeletal: No deformities, no cyanosis or clubbing Neuro:  alert, non focal, no tremors         Assessment & Plan:

## 2018-12-17 NOTE — Assessment & Plan Note (Signed)
Continue albuterol for now. Spirometry pre-and post next visit and based on this we will decide if she needs inhaled steroids or not.  I doubt the diagnosis of allergy induced asthma

## 2018-12-17 NOTE — Patient Instructions (Signed)
Ambulatory saturation today. Based on this, discontinue oxygen.  Home sleep test-based on this result, we will set you up with CPAP machine  Spirometry pre-and post on next visit

## 2018-12-18 ENCOUNTER — Encounter: Payer: Self-pay | Admitting: Family Medicine

## 2018-12-21 ENCOUNTER — Ambulatory Visit: Payer: Self-pay | Attending: Nurse Practitioner | Admitting: Pulmonary Disease

## 2018-12-21 DIAGNOSIS — R0683 Snoring: Secondary | ICD-10-CM

## 2018-12-21 DIAGNOSIS — G4733 Obstructive sleep apnea (adult) (pediatric): Secondary | ICD-10-CM | POA: Insufficient documentation

## 2018-12-27 ENCOUNTER — Telehealth: Payer: Self-pay | Admitting: Pulmonary Disease

## 2018-12-27 DIAGNOSIS — G4733 Obstructive sleep apnea (adult) (pediatric): Secondary | ICD-10-CM | POA: Diagnosis not present

## 2018-12-27 NOTE — Telephone Encounter (Signed)
NPSG showed mod OSA -RDI 21/h Suggest autoCPAP, mask of choice, 5-15 cm, OV in 6 wks with NP/ me

## 2018-12-27 NOTE — Procedures (Signed)
Patient Name: Taylor Leon, Taylor Leon Date: 12/21/2018 Gender: Female D.O.B: 06/13/1956 Age (years): 33 Referring Provider: Fenton Foy NP Height (inches): 70 Interpreting Physician: Kara Mead MD, ABSM Weight (lbs): 328 RPSGT: Peak, Robert BMI: 51 MRN: [id number] Neck Size: 15.50 <br> <br> CLINICAL INFORMATION Sleep Study Type: NPSG    Indication for sleep study: Snoring    Epworth Sleepiness Score: 6    SLEEP STUDY TECHNIQUE As per the AASM Manual for the Scoring of Sleep and Associated Events v2.3 (April 2016) with a hypopnea requiring 4% desaturations.  The channels recorded and monitored were frontal, central and occipital EEG, electrooculogram (EOG), submentalis EMG (chin), nasal and oral airflow, thoracic and abdominal wall motion, anterior tibialis EMG, snore microphone, electrocardiogram, and pulse oximetry.  MEDICATIONS Medications self-administered by patient taken the night of the study : N/A  SLEEP ARCHITECTURE The study was initiated at 10:39:15 PM and ended at 5:04:58 AM.  Sleep onset time was 29.8 minutes and the sleep efficiency was 67.7%%. The total sleep time was 261 minutes.  Stage REM latency was 199.0 minutes.  The patient spent 13.6%% of the night in stage N1 sleep, 74.2%% in stage N2 sleep, 0.0%% in stage N3 and 12.3% in REM.  Alpha intrusion was absent.  Supine sleep was 0.00%.  RESPIRATORY PARAMETERS The overall apnea/hypopnea index (AHI) was 8.7 per hour but the RDI was 21/h. There were 0 total apneas, including 0 obstructive, 0 central and 0 mixed apneas. There were 38 hypopneas and 55 RERAs. No relation to body position.  The AHI during Stage REM sleep was 31.9 per hour.  The mean oxygen saturation was 92.0%. The minimum SpO2 during sleep was 83.0%.  loud snoring was noted during this study.  CARDIAC DATA The 2 lead EKG demonstrated sinus rhythm. The mean heart rate was 60.0 beats per minute. Other EKG findings include: PVCs. LEG  MOVEMENT DATA The total PLMS were 0 with a resulting PLMS index of 0.0. Associated arousal with leg movement index was 0.0 .  IMPRESSIONS - Mild - moderate obstructive sleep apnea occurred during this study (AHI = 8.7/h). - No significant central sleep apnea occurred during this study (CAI = 0.0/h). - Mild oxygen desaturation was noted during this study (Min O2 = 83.0%). - The patient snored with loud snoring volume. - EKG findings include PVCs. - Clinically significant periodic limb movements did not occur during sleep. No significant associated arousals.   DIAGNOSIS - Obstructive Sleep Apnea (327.23 [G47.33 ICD-10])   RECOMMENDATIONS - Therapeutic CPAP titration to determine optimal pressure required to alleviate sleep disordered breathing. Alternatively, autoCPAP can be tried. - Avoid alcohol, sedatives and other CNS depressants that may worsen sleep apnea and disrupt normal sleep architecture. - Sleep hygiene should be reviewed to assess factors that may improve sleep quality. - Weight management and regular exercise should be initiated or continued if appropriate.    Kara Mead MD Board Certified in [place]

## 2018-12-30 ENCOUNTER — Encounter: Payer: Self-pay | Admitting: Family Medicine

## 2019-01-02 NOTE — Progress Notes (Signed)
That is good. Disregard my previous note. No need to order cpap titration. Thanks.

## 2019-01-03 ENCOUNTER — Ambulatory Visit (INDEPENDENT_AMBULATORY_CARE_PROVIDER_SITE_OTHER): Payer: BLUE CROSS/BLUE SHIELD | Admitting: Nurse Practitioner

## 2019-01-03 ENCOUNTER — Ambulatory Visit (INDEPENDENT_AMBULATORY_CARE_PROVIDER_SITE_OTHER)
Admission: RE | Admit: 2019-01-03 | Discharge: 2019-01-03 | Disposition: A | Discharge status: Home / Self Care | Payer: BLUE CROSS/BLUE SHIELD | Source: Ambulatory Visit | Attending: Nurse Practitioner | Admitting: Nurse Practitioner

## 2019-01-03 ENCOUNTER — Encounter: Payer: Self-pay | Admitting: Nurse Practitioner

## 2019-01-03 VITALS — BP 126/68 | HR 67 | Ht 67.0 in | Wt 322.8 lb

## 2019-01-03 DIAGNOSIS — R05 Cough: Secondary | ICD-10-CM

## 2019-01-03 DIAGNOSIS — R059 Cough, unspecified: Secondary | ICD-10-CM

## 2019-01-03 DIAGNOSIS — G4733 Obstructive sleep apnea (adult) (pediatric): Secondary | ICD-10-CM | POA: Diagnosis not present

## 2019-01-03 DIAGNOSIS — J309 Allergic rhinitis, unspecified: Secondary | ICD-10-CM

## 2019-01-03 DIAGNOSIS — J45991 Cough variant asthma: Secondary | ICD-10-CM | POA: Insufficient documentation

## 2019-01-03 MED ORDER — BENZONATATE 100 MG PO CAPS: 100.0000 mg | ORAL_CAPSULE | Freq: Three times a day (TID) | ORAL | 1 refills | Status: DC

## 2019-01-03 NOTE — Progress Notes (Signed)
@Patient  ID: Taylor Leon, female    DOB: April 13, 1956, 63 y.o.   MRN: [id number]  Chief Complaint  Patient presents with  . Cough    Dry cough, no congestion, no fever.    Referring provider: Tammi Sou, MD  HPI 63 year old female with mild persistent asthma and OSA followed by Dr. Elsworth Soho.  Tests:  CXR 12/06/18 - Left-sided pleural thickening/effusion with underlying left base atelectasis/infiltrate. Slight improvement from prior exam. No new abnormality identified.  CXR 11/20/18 - Improving aeration at the LEFT lung base.  CXR 10/31/18 - There is persistent increased density in the left mid and lower lung with obscuration of the left hemidiaphragm and portions of the left heart border. The right lung is adequately inflated and clear. The interstitial markings of the aerated portions of both lungs remain increased.  OV 01/03/19 - cough Patient presents today with cough.  States that symptoms started about a week ago.  Cough is nonproductive.  She has been taking Robitussin with moderate relief noted.  She has not been using her Proventil.  She is just concerned after recent pneumonia and left VATS and wanted to be checked out.  He denies any recent fever.  She denies any shortness of breath, chest pain, or edema.  Denies any sinus congestion.  States that she does have some postnasal drip.   Allergies  Allergen Reactions  . Penicillins Nausea Only and Rash    Has patient had a PCN reaction causing immediate rash, facial/tongue/throat swelling, SOB or lightheadedness with hypotension: yES Has patient had a PCN reaction causing severe rash involving mucus membranes or skin necrosis: No Has patient had a PCN reaction that required hospitalization: No Has patient had a PCN reaction occurring within the last 10 years: No If all of the above answers are "NO", then may proceed with Cephalosporin use. CC    Immunization History  Administered Date(s) Administered  . Influenza,inj,Quad  PF,6+ Mos 08/29/2013, 09/10/2015, 12/06/2016, 11/02/2017, 10/10/2018  . PPD Test 01/27/2017  . Tdap 06/05/2015  . Zoster Recombinat (Shingrix) 06/20/2017, 08/30/2017    Past Medical History:  Diagnosis Date  . BPPV (benign paroxysmal positional vertigo) 01/30/2014  . CAP (community acquired pneumonia) 10/2018   with acute hypoxic RF, empyema-->VATS  . Cyst, breast 01/2012; 06/2012; 03/01/61; 07/28/14   Complicated cysts in upper outer left breast--no evidence of malignancy--f/u bilat diag mammo/left breast u/s on 03/06/15 showed resolution of left breast cysts.  Repeat screening mammogram 1 yr.  . Diabetes mellitus without complication (HCC)    Pre-diabetes  . Empyema ([place])    drained by CT surgery/VATS.  Marland Kitchen H/O allergic rhinitis    Dr. Ishmael Holter  . History of pneumonia 2009 and 2010   supplemential oxygen d/c'd 12/16/18 by pulm  . Mild persistent asthma, well controlled 2014   New adult onset asthma after respiratory infection (Dr. Ishmael Holter)  . Morbid obesity ([place])    BMI 50+.  At one point in time she was going to Bariatric clinic on [address] and got weekly HCG injections, monthly vit B12 injections, and took phentermine daily.  As of 05/2015 she was no longer doing this.  . Nephrolithiasis 04/2018 first episode   Dr[facility]ighbours ([facility]): 1.57mm right UVJ stone + 6 mm L AML.  Pt elected for trial of passage.  . OSA (obstructive sleep apnea) 10/2018   Mild/mod OSA 12/2018--titration study planned  . Osteoarthritis of both knees    No improvement with steroid injections; did synvisc x 3 yrs;  bilat replacement was recommended but she declined.  . Recurrent low back pain     Tobacco History: Social History   Tobacco Use  Smoking Status Former Smoker  . Types: Cigarettes  . Last attempt to quit: 12/28/2009  . Years since quitting: 9.0  Smokeless Tobacco Never Used   Counseling given: Not Answered   Outpatient Encounter Medications as of 01/03/2019  Medication Sig  . BAYER MICROLET LANCETS  lancets Use to check blood sugar once daily  . glucose blood (CONTOUR NEXT TEST) test strip Use to check blood sugar once daily  . metFORMIN (GLUCOPHAGE) 1000 MG tablet 1 tab po bid with meals  . albuterol (PROVENTIL) (2.5 MG/3ML) 0.083% nebulizer solution Take 3 mLs (2.5 mg total) by nebulization every 6 (six) hours as needed for wheezing. (Patient not taking: Reported on 01/03/2019)  . albuterol (VENTOLIN HFA) 108 (90 Base) MCG/ACT inhaler Inhale 2 puffs into the lungs every 4 (four) hours as needed for wheezing. (Patient not taking: Reported on 01/03/2019)  . ascorbic acid (VITAMIN C) 1000 MG tablet Take 1,000 mg by mouth daily.  . benzonatate (TESSALON) 100 MG capsule Take 1 capsule (100 mg total) by mouth 3 (three) times daily.  . Cholecalciferol (VITAMIN D-3 PO) Take 1 tablet by mouth daily.  Marland Kitchen guaiFENesin-dextromethorphan (ROBITUSSIN DM) 100-10 MG/5ML syrup Take 5 mLs by mouth every 4 (four) hours as needed for cough. (Patient not taking: Reported on 01/03/2019)  . meloxicam (MOBIC) 7.5 MG tablet 1-2 tabs po qd prn arthritis pain (Patient not taking: Reported on 01/03/2019)   No facility-administered encounter medications on file as of 01/03/2019.      Review of Systems  Review of Systems  Constitutional: Negative.  Negative for chills and fever.  HENT: Positive for postnasal drip. Negative for congestion.   Respiratory: Positive for cough. Negative for shortness of breath and wheezing.   Cardiovascular: Negative.  Negative for chest pain, palpitations and leg swelling.  Gastrointestinal: Negative.   Allergic/Immunologic: Negative.   Neurological: Negative.   Psychiatric/Behavioral: Negative.        Physical Exam  BP 126/68 (BP Location: Left Arm, Patient Position: Sitting, Cuff Size: Normal)   Pulse 67   Ht 5\' 7"  (1.702 m)   Wt (!) 322 lb 12.8 oz (146.4 kg)   SpO2 98%   BMI 50.56 kg/m   Wt Readings from Last 5 Encounters:  01/03/19 (!) 322 lb 12.8 oz (146.4 kg)  12/17/18 (!)  323 lb (146.5 kg)  12/06/18 (!) 323 lb (146.5 kg)  11/19/18 (!) 328 lb 9.6 oz (149.1 kg)  11/15/18 (!) 339 lb (153.8 kg)     Physical Exam Vitals signs and nursing note reviewed.  Constitutional:      General: She is not in acute distress.    Appearance: She is well-developed.  Cardiovascular:     Rate and Rhythm: Normal rate and regular rhythm.  Pulmonary:     Effort: Pulmonary effort is normal. No respiratory distress.     Breath sounds: Normal breath sounds. No wheezing or rhonchi.  Musculoskeletal:        General: No swelling.  Neurological:     Mental Status: She is alert and oriented to person, place, and time.     Imaging: Dg Chest 2 View  Result Date: 01/03/2019 CLINICAL DATA:  Dry cough.  History of asthma. EXAM: CHEST - 2 VIEW COMPARISON:  12/06/2018.  11/15/2018. FINDINGS: Mediastinum and hilar structures normal. Cardiomegaly with normal pulmonary vascularity. Left mid lung field subsegmental  atelectasis. Small left pleural effusion. These findings have improved slightly from prior exam no pneumothorax. IMPRESSION: Mild left mid lung field subsegmental and/or scarring. Small left-sided pleural effusion again noted. Continued improvement of these findings from prior study. Electronically Signed   By: Marcello Moores  Register   On: 01/03/2019 12:43   Dg Chest 2 View  Result Date: 12/06/2018 CLINICAL DATA:  History of VATS/empyema.  Shortness of breath. EXAM: CHEST - 2 VIEW COMPARISON:  11/19/2018. FINDINGS: Mediastinum hilar structures normal. Stable cardiomegaly with normal pulmonary vascularity. Left-sided pleural thickening/effusion with underlying left base atelectasis/infiltrate. Slight improvement from prior exam. No pneumothorax. IMPRESSION: Left-sided pleural thickening/effusion with underlying left base atelectasis/infiltrate. Slight improvement from prior exam. No new abnormality identified. Electronically Signed   By: Marcello Moores  Register   On: 12/06/2018 13:40      Assessment & Plan:   Cough Will check chest x ray and call with results.   Patient Instructions  Will order chest x ray and call with results Continue current medications Will order tessalon pearls May take mucinex - samples Please keep upcoming appointment with CT surgery Follow up as scheduled with Dr. Elsworth Soho - or sooner if needed    Allergic rhinitis May take zyrtec daily     Fenton Foy, NP 01/03/2019

## 2019-01-03 NOTE — Assessment & Plan Note (Signed)
Will check chest x ray and call with results.   Patient Instructions  Will order chest x ray and call with results Continue current medications Will order tessalon pearls May take mucinex - samples Please keep upcoming appointment with CT surgery Follow up as scheduled with Dr. Elsworth Soho - or sooner if needed

## 2019-01-03 NOTE — Addendum Note (Signed)
Addended by: Nena Polio on: 01/03/2019 03:17 PM   Modules accepted: Orders

## 2019-01-03 NOTE — Assessment & Plan Note (Signed)
May take zyrtec daily

## 2019-01-03 NOTE — Patient Instructions (Signed)
Will order chest x ray and call with results Continue current medications Will order tessalon pearls May take mucinex - samples Please keep upcoming appointment with CT surgery Follow up as scheduled with Dr. Elsworth Soho - or sooner if needed

## 2019-01-16 ENCOUNTER — Other Ambulatory Visit: Payer: Self-pay | Admitting: Cardiothoracic Surgery

## 2019-01-16 DIAGNOSIS — J869 Pyothorax without fistula: Secondary | ICD-10-CM

## 2019-01-17 ENCOUNTER — Ambulatory Visit
Admission: RE | Admit: 2019-01-17 | Discharge: 2019-01-17 | Disposition: A | Discharge status: Home / Self Care | Payer: BLUE CROSS/BLUE SHIELD | Source: Ambulatory Visit | Attending: Cardiothoracic Surgery | Admitting: Cardiothoracic Surgery

## 2019-01-17 ENCOUNTER — Encounter: Payer: Self-pay | Admitting: Cardiothoracic Surgery

## 2019-01-17 ENCOUNTER — Other Ambulatory Visit: Payer: Self-pay

## 2019-01-17 ENCOUNTER — Ambulatory Visit (INDEPENDENT_AMBULATORY_CARE_PROVIDER_SITE_OTHER): Payer: BLUE CROSS/BLUE SHIELD | Admitting: Cardiothoracic Surgery

## 2019-01-17 VITALS — BP 138/76 | HR 62 | Resp 18 | Ht 67.0 in | Wt 322.0 lb

## 2019-01-17 DIAGNOSIS — J869 Pyothorax without fistula: Secondary | ICD-10-CM | POA: Diagnosis not present

## 2019-01-17 DIAGNOSIS — Z9889 Other specified postprocedural states: Secondary | ICD-10-CM | POA: Diagnosis not present

## 2019-01-17 DIAGNOSIS — R918 Other nonspecific abnormal finding of lung field: Secondary | ICD-10-CM | POA: Diagnosis not present

## 2019-01-17 NOTE — Progress Notes (Signed)
[address]       [place] [address]             [phone]      Talya Bernards [place] Medical Record #[id number] Date of Birth: 1956-03-02  Referring: Rush Farmer, MD Primary Care: Tammi Sou, MD Primary Cardiologist: No primary care provider on file.   Chief Complaint:   POST OP FOLLOW UP OPERATIVE REPORT DATE OF PROCEDURE: 10/24/2018 PREOPERATIVE DIAGNOSIS: Left chest empyema. POSTOPERATIVE DIAGNOSIS: Left chest empyema. SURGICAL PROCEDURE: Bronchoscopy, left video-assisted thoracoscopy, mini thoracotomy, drainage of empyema and decortication. SURGEON: Lanelle Bal, MD  History of Present Illness:     Patient underwent left video-assisted thoracoscopy minithoracotomy decortication drainage of empyema November 30.  Patient continues to improve following her drainage of empyema and decortication in the fall 2019.  She notes that in early January she had increasing cough but this is now resolved.  She continues to be followed by the pulmonary service for respiratory complaints and has been evaluated for asthma.     Past Medical History:  Diagnosis Date  . BPPV (benign paroxysmal positional vertigo) 01/30/2014  . CAP (community acquired pneumonia) 10/2018   with acute hypoxic RF, empyema-->VATS  . Cyst, breast 01/2012; 06/2012; 05/02/84; 0/2/77   Complicated cysts in upper outer left breast--no evidence of malignancy--f/u bilat diag mammo/left breast u/s on 03/06/15 showed resolution of left breast cysts.  Repeat screening mammogram 1 yr.  . Diabetes mellitus without complication (HCC)    Pre-diabetes  . Empyema ([place])    drained by CT surgery/VATS.  Marland Kitchen H/O allergic rhinitis    Dr. Ishmael Holter  . History of pneumonia 2009 and 2010   supplemential oxygen d/c'd 12/16/18 by pulm  . Mild persistent asthma, well controlled 2014   New adult onset asthma after respiratory infection (Dr. Ishmael Holter)  . Morbid obesity ([place])    BMI 50+.  At one point in time  she was going to Bariatric clinic on [address] and got weekly HCG injections, monthly vit B12 injections, and took phentermine daily.  As of 05/2015 she was no longer doing this.  . Nephrolithiasis 04/2018 first episode   [facility] 1.41mm right UVJ stone + 6 mm L AML.  Pt elected for trial of passage.  . OSA (obstructive sleep apnea) 10/2018   Mild/mod OSA 12/2018--titration study planned  . Osteoarthritis of both knees    No improvement with steroid injections; did synvisc x 3 yrs; bilat replacement was recommended but she declined.  . Recurrent low back pain      Social History   Tobacco Use  Smoking Status Former Smoker  . Types: Cigarettes  . Last attempt to quit: 12/28/2009  . Years since quitting: 9.0  Smokeless Tobacco Never Used    Social History   Substance and Sexual Activity  Alcohol Use Yes   Comment: social     Allergies  Allergen Reactions  . Penicillins Nausea Only and Rash    Has patient had a PCN reaction causing immediate rash, facial/tongue/throat swelling, SOB or lightheadedness with hypotension: yES Has patient had a PCN reaction causing severe rash involving mucus membranes or skin necrosis: No Has patient had a PCN reaction that required hospitalization: No Has patient had a PCN reaction occurring within the last 10 years: No If all of the above answers are "NO", then may proceed with Cephalosporin use. CC    Current Outpatient Medications  Medication Sig Dispense Refill  .  albuterol (VENTOLIN HFA) 108 (90 Base) MCG/ACT inhaler Inhale 2 puffs into the lungs every 4 (four) hours as needed for wheezing. 1 Inhaler 1  . ascorbic acid (VITAMIN C) 1000 MG tablet Take 1,000 mg by mouth daily.    Marland Kitchen BAYER MICROLET LANCETS lancets Use to check blood sugar once daily 100 each 11  . Cholecalciferol (VITAMIN D-3 PO) Take 1 tablet by mouth daily.    Marland Kitchen glucose blood (CONTOUR NEXT TEST) test strip Use to check blood sugar once daily 100 each 11  .  metFORMIN (GLUCOPHAGE) 1000 MG tablet 1 tab po bid with meals 60 tablet 6  . albuterol (PROVENTIL) (2.5 MG/3ML) 0.083% nebulizer solution Take 3 mLs (2.5 mg total) by nebulization every 6 (six) hours as needed for wheezing. (Patient not taking: Reported on 01/03/2019) 45 mL 1  . benzonatate (TESSALON) 100 MG capsule Take 1 capsule (100 mg total) by mouth 3 (three) times daily. (Patient not taking: Reported on 01/17/2019) 30 capsule 1  . meloxicam (MOBIC) 7.5 MG tablet 1-2 tabs po qd prn arthritis pain (Patient not taking: Reported on 01/03/2019) 60 tablet 6   No current facility-administered medications for this visit.        Physical Exam: BP 138/76 (BP Location: Right Arm, Patient Position: Sitting, Cuff Size: Large)   Pulse 62   Resp 18   Ht 5\' 7"  (1.702 m)   Wt (!) 322 lb (146.1 kg)   SpO2 95% Comment: ON RA  BMI 50.43 kg/m   General appearance: alert, cooperative and no distress Head: Normocephalic, without obvious abnormality, atraumatic Neck: no adenopathy, no carotid bruit, no JVD, supple, symmetrical, trachea midline and thyroid not enlarged, symmetric, no tenderness/mass/nodules Lymph nodes: Cervical, supraclavicular, and axillary nodes normal. Resp: clear to auscultation bilaterally Back: symmetric, no curvature. ROM normal. No CVA tenderness. Cardio: regular rate and rhythm, S1, S2 normal, no murmur, click, rub or gallop GI: soft, non-tender; bowel sounds normal; no masses,  no organomegaly Extremities: extremities normal, atraumatic, no cyanosis or edema and Homans sign is negative, no sign of DVT Neurologic: Grossly normal Incisions and chest tube sites are well-healed  Diagnostic Studies & Laboratory data:     Recent Radiology Findings:   Dg Chest 2 View  Result Date: 01/17/2019 CLINICAL DATA:  63 year old female with a history of prior VATS EXAM: CHEST - 2 VIEW COMPARISON:  01/03/2019, 12/06/2018 FINDINGS: Cardiomediastinal silhouette unchanged in size and contour. No  evidence of central vascular congestion. No interlobular septal thickening. Similar opacity at the left lung base partially obscuring the left hemidiaphragm and obscuring the left costophrenic angle. Mild thickening at the pleuroparenchymal interface on the left. No new confluent airspace disease. No pneumothorax. IMPRESSION: Similar appearance of the chest x-ray with either left-sided basilar scarring/atelectasis and/or small pleural fluid. No acute finding. Electronically Signed   By: Corrie Mckusick D.O.   On: 01/17/2019 13:11    I have independently reviewed the above radiology studies  and reviewed the findings with the patient.   Recent Lab Findings: Lab Results  Component Value Date   WBC 13.0 (H) 10/31/2018   HGB 8.9 (L) 10/31/2018   HCT 29.0 (L) 10/31/2018   PLT 447 (H) 10/31/2018   GLUCOSE 97 10/30/2018   CHOL 199 06/20/2017   TRIG 148 10/24/2018   HDL 84.00 06/20/2017   LDLCALC 92 06/20/2017   ALT 25 10/26/2018   AST 20 10/26/2018   NA 140 10/30/2018   K 3.4 (L) 10/30/2018   CL 103 10/30/2018  CREATININE 0.73 10/30/2018   BUN <5 (L) 10/30/2018   CO2 30 10/30/2018   TSH 1.09 06/20/2017   HGBA1C 5.6 10/10/2018      Assessment / Plan:   Stable following drainage of empyema on the left-patient's made full surgical recovery Follow-up chest x-ray appears stable Continues to be followed by pulmonary will see her back as necessary  Grace Isaac MD      [address] [place], [address] Office [phone]   Beeper [phone]  01/17/2019 1:45 PM

## 2019-01-21 ENCOUNTER — Other Ambulatory Visit: Payer: Self-pay | Admitting: Family Medicine

## 2019-01-21 DIAGNOSIS — Z1231 Encounter for screening mammogram for malignant neoplasm of breast: Secondary | ICD-10-CM

## 2019-01-22 ENCOUNTER — Encounter: Payer: Self-pay | Admitting: Family Medicine

## 2019-01-23 ENCOUNTER — Ambulatory Visit
Admission: RE | Admit: 2019-01-23 | Discharge: 2019-01-23 | Disposition: A | Discharge status: Home / Self Care | Payer: BLUE CROSS/BLUE SHIELD | Source: Ambulatory Visit | Attending: Family Medicine | Admitting: Family Medicine

## 2019-01-23 DIAGNOSIS — Z1231 Encounter for screening mammogram for malignant neoplasm of breast: Secondary | ICD-10-CM

## 2019-01-24 ENCOUNTER — Ambulatory Visit: Payer: Self-pay | Admitting: Cardiothoracic Surgery

## 2019-01-25 ENCOUNTER — Encounter: Payer: Self-pay | Admitting: Family Medicine

## 2019-01-25 ENCOUNTER — Ambulatory Visit (INDEPENDENT_AMBULATORY_CARE_PROVIDER_SITE_OTHER): Payer: BLUE CROSS/BLUE SHIELD | Admitting: Family Medicine

## 2019-01-25 ENCOUNTER — Telehealth: Payer: Self-pay | Admitting: Family Medicine

## 2019-01-25 VITALS — BP 107/72 | HR 60 | Temp 98.2°F | Resp 16 | Ht 67.0 in | Wt 315.2 lb

## 2019-01-25 DIAGNOSIS — Z0000 Encounter for general adult medical examination without abnormal findings: Secondary | ICD-10-CM

## 2019-01-25 DIAGNOSIS — Z13820 Encounter for screening for osteoporosis: Secondary | ICD-10-CM

## 2019-01-25 DIAGNOSIS — E119 Type 2 diabetes mellitus without complications: Secondary | ICD-10-CM

## 2019-01-25 DIAGNOSIS — E2839 Other primary ovarian failure: Secondary | ICD-10-CM | POA: Diagnosis not present

## 2019-01-25 DIAGNOSIS — Z23 Encounter for immunization: Secondary | ICD-10-CM | POA: Diagnosis not present

## 2019-01-25 LAB — COMPREHENSIVE METABOLIC PANEL
ALT: 12 U/L (ref 0–35)
AST: 13 U/L (ref 0–37)
Albumin: 4.2 g/dL (ref 3.5–5.2)
Alkaline Phosphatase: 63 U/L (ref 39–117)
BILIRUBIN TOTAL: 0.4 mg/dL (ref 0.2–1.2)
BUN: 14 mg/dL (ref 6–23)
CO2: 30 meq/L (ref 19–32)
Calcium: 9.9 mg/dL (ref 8.4–10.5)
Chloride: 104 mEq/L (ref 96–112)
Creatinine, Ser: 0.79 mg/dL (ref 0.40–1.20)
GFR: 88.95 mL/min (ref >60.00)
GLUCOSE: 101 mg/dL — AB (ref 70–99)
POTASSIUM: 4.3 meq/L (ref 3.5–5.1)
Sodium: 140 mEq/L (ref 135–145)
Total Protein: 6.9 g/dL (ref 6.0–8.3)

## 2019-01-25 LAB — CBC WITH DIFFERENTIAL/PLATELET
BASOS PCT: 0.5 % (ref 0.0–3.0)
Basophils Absolute: 0 10*3/uL (ref 0.0–0.1)
EOS PCT: 2.7 % (ref 0.0–5.0)
Eosinophils Absolute: 0.2 10*3/uL (ref 0.0–0.7)
HEMATOCRIT: 41.3 % (ref 36.0–46.0)
HEMOGLOBIN: 13.2 g/dL (ref 12.0–15.0)
LYMPHS ABS: 2.4 10*3/uL (ref 0.7–4.0)
Lymphocytes Relative: 34 % (ref 12.0–46.0)
MCHC: 31.9 g/dL (ref 30.0–36.0)
MCV: 82 fl (ref 78.0–100.0)
MONOS PCT: 6.9 % (ref 3.0–12.0)
Monocytes Absolute: 0.5 10*3/uL (ref 0.1–1.0)
Neutro Abs: 4 10*3/uL (ref 1.4–7.7)
Neutrophils Relative %: 55.9 % (ref 43.0–77.0)
Platelets: 303 10*3/uL (ref 150.0–400.0)
RBC: 5.04 Mil/uL (ref 3.87–5.11)
RDW: 17.4 % — AB (ref 11.5–15.5)
WBC: 7.2 10*3/uL (ref 4.0–10.5)

## 2019-01-25 LAB — HEMOGLOBIN A1C: Hgb A1c MFr Bld: 5.8 % (ref 4.6–6.5)

## 2019-01-25 LAB — LIPID PANEL
CHOL/HDL RATIO: 2
Cholesterol: 187 mg/dL (ref 0–200)
HDL: 76.7 mg/dL (ref >39.00)
LDL CALC: 85 mg/dL (ref 0–99)
NONHDL: 109.92
TRIGLYCERIDES: 123 mg/dL (ref 0.0–149.0)
VLDL: 24.6 mg/dL (ref 0.0–40.0)

## 2019-01-25 LAB — MICROALBUMIN / CREATININE URINE RATIO
Creatinine,U: 129 mg/dL
Microalb Creat Ratio: 2.1 mg/g (ref 0.0–30.0)
Microalb, Ur: 2.6 mg/dL — ABNORMAL HIGH (ref 0.0–1.9)

## 2019-01-25 LAB — TSH: TSH: 1.54 u[IU]/mL (ref 0.35–4.50)

## 2019-01-25 NOTE — Telephone Encounter (Signed)
Pt got this information and verbalized understanding. Nothing further needed

## 2019-01-25 NOTE — Addendum Note (Signed)
Addended by: Gerilyn Nestle on: 01/25/2019 09:22 AM   Modules accepted: Orders

## 2019-01-25 NOTE — Progress Notes (Signed)
Office Note 01/25/2019  CC:  Chief Complaint  Patient presents with  . Annual Exam    Pt is fasting.     HPI:  Taylor Leon is a 63 y.o. Black female who is here for annual health maintenance exam.  She has been getting over a bad case of CAP with empyema. Released from CV surg f/u last week.  She is feeling good.  Still with slight cough.  No fevers or CP. She is frustrated b/c glucoses 130s-140s fasting.  Taking metformin 1000 mg bid. No burning, tingling, or numbness in feet.  Past Medical History:  Diagnosis Date  . BPPV (benign paroxysmal positional vertigo) 01/30/2014  . CAP (community acquired pneumonia) 10/2018   with acute hypoxic RF, empyema-->VATS  . Cyst, breast 01/2012; 06/2012; 12/31/94; 07/02/92   Complicated cysts in upper outer left breast--no evidence of malignancy--f/u bilat diag mammo/left breast u/s on 03/06/15 showed resolution of left breast cysts.  Repeat screening mammogram 1 yr.  . Diabetes mellitus without complication (HCC)    Pre-diabetes  . Empyema ([place])    drained by CT surgery/VATS.  Full recovery as of 12/2018 CT surgery f/u visit.  . H/O allergic rhinitis    Dr. Ishmael Holter  . History of pneumonia 2009 and 2010   supplemential oxygen d/c'd 12/16/18 by pulm  . Mild persistent asthma, well controlled 2014   New adult onset asthma after respiratory infection (Dr. Ishmael Holter)  . Morbid obesity ([place])    BMI 50+.  At one point in time she was going to Bariatric clinic on [address] and got weekly HCG injections, monthly vit B12 injections, and took phentermine daily.  As of 05/2015 she was no longer doing this.  . Nephrolithiasis 04/2018 first episode   [facility] 1.71mm right UVJ stone + 6 mm L AML.  Pt elected for trial of passage.  . OSA (obstructive sleep apnea) 10/2018   Mild/mod OSA 12/2018--titration study planned  . Osteoarthritis of both knees    No improvement with steroid injections; did synvisc x 3 yrs; bilat replacement was recommended but  she declined.  . Recurrent low back pain     Past Surgical History:  Procedure Laterality Date  . APPENDECTOMY  1978  . CARDIAC CATHETERIZATION  2010   Normal coronaries per pt  . CARDIOVASCULAR STRESS TEST  2010   abnl per pt; f/u cath clean  . CESAREAN SECTION     x 3  . CHOLECYSTECTOMY  1986  . COLONOSCOPY  2008   normal per pt report (in [place])  . EMPYEMA DRAINAGE Left 10/24/2018   Evacuation of empyema with decortication.  Procedure: EMPYEMA DRAINAGE;  Surgeon: Grace Isaac, MD;  Location: [facility];  Service: Thoracic;  Laterality: Left;  . LUMBAR DISC SURGERY  1993   L5/S1  . TONSILLECTOMY AND ADENOIDECTOMY  1967ish  . TOTAL ABDOMINAL HYSTERECTOMY  1994   Ovaries are still in.  This was done for DUB/fibroids.  Marland Kitchen VIDEO ASSISTED THORACOSCOPY (VATS)/THOROCOTOMY Left 10/24/2018   Procedure: VIDEO ASSISTED THORACOSCOPY (VATS)/THOROCOTOMY with EVACUATION OF EMPYEMA AND DECORTICATION.;  Surgeon: Grace Isaac, MD;  Location: [facility];  Service: Thoracic;  Laterality: Left;  Marland Kitchen VIDEO BRONCHOSCOPY N/A 10/24/2018   Procedure: VIDEO BRONCHOSCOPY;  Surgeon: Grace Isaac, MD;  Location: [facility] OR;  Service: Thoracic;  Laterality: N/A;    Family History  Problem Relation Age of Onset  . Arthritis Mother   . Heart disease Mother 73  . Hypertension Mother   .  Diabetes Mother   . Arthritis Paternal Grandmother   . Breast cancer Neg Hx     Social History   Socioeconomic History  . Marital status: Married    Spouse name: Not on file  . Number of children: 4  . Years of education: Not on file  . Highest education level: Not on file  Occupational History  . Not on file  Social Needs  . Financial resource strain: Not on file  . Food insecurity:    Worry: Not on file    Inability: Not on file  . Transportation needs:    Medical: Not on file    Non-medical: Not on file  Tobacco Use  . Smoking status: Former Smoker    Types: Cigarettes    Last attempt to quit: 12/28/2009     Years since quitting: 9.0  . Smokeless tobacco: Never Used  Substance and Sexual Activity  . Alcohol use: Yes    Comment: social  . Drug use: No  . Sexual activity: Not on file  Lifestyle  . Physical activity:    Days per week: Not on file    Minutes per session: Not on file  . Stress: Not on file  Relationships  . Social connections:    Talks on phone: Not on file    Gets together: Not on file    Attends religious service: Not on file    Active member of club or organization: Not on file    Attends meetings of clubs or organizations: Not on file    Relationship status: Not on file  . Intimate partner violence:    Fear of current or ex partner: Not on file    Emotionally abused: Not on file    Physically abused: Not on file    Forced sexual activity: Not on file  Other Topics Concern  . Not on file  Social History Narrative   Married, 4 grown children.   Relocated to [place], [place] from [place] about 2012.   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   Exercise: intermittently does water aerobics..             Outpatient Medications Prior to Visit  Medication Sig Dispense Refill  . albuterol (PROVENTIL) (2.5 MG/3ML) 0.083% nebulizer solution Take 3 mLs (2.5 mg total) by nebulization every 6 (six) hours as needed for wheezing. 45 mL 1  . albuterol (VENTOLIN HFA) 108 (90 Base) MCG/ACT inhaler Inhale 2 puffs into the lungs every 4 (four) hours as needed for wheezing. 1 Inhaler 1  . BAYER MICROLET LANCETS lancets Use to check blood sugar once daily 100 each 11  . glucose blood (CONTOUR NEXT TEST) test strip Use to check blood sugar once daily 100 each 11  . meloxicam (MOBIC) 7.5 MG tablet 1-2 tabs po qd prn arthritis pain 60 tablet 6  . metFORMIN (GLUCOPHAGE) 1000 MG tablet 1 tab po bid with meals 60 tablet 6  . ascorbic acid (VITAMIN C) 1000 MG tablet Take 1,000 mg by mouth daily.    . benzonatate (TESSALON)  100 MG capsule Take 1 capsule (100 mg total) by mouth 3 (three) times daily. (Patient not taking: Reported on 01/17/2019) 30 capsule 1  . Cholecalciferol (VITAMIN D-3 PO) Take 1 tablet by mouth daily.     No facility-administered medications prior to visit.     Allergies  Allergen Reactions  . Penicillins Nausea Only and Rash  Has patient had a PCN reaction causing immediate rash, facial/tongue/throat swelling, SOB or lightheadedness with hypotension: yES Has patient had a PCN reaction causing severe rash involving mucus membranes or skin necrosis: No Has patient had a PCN reaction that required hospitalization: No Has patient had a PCN reaction occurring within the last 10 years: No If all of the above answers are "NO", then may proceed with Cephalosporin use. CC    ROS Review of Systems  Constitutional: Negative for appetite change, chills, fatigue and fever.  HENT: Negative for congestion, dental problem, ear pain and sore throat.   Eyes: Negative for discharge, redness and visual disturbance.  Respiratory: Positive for cough (slight; resolving). Negative for chest tightness, shortness of breath and wheezing.   Cardiovascular: Negative for chest pain, palpitations and leg swelling.  Gastrointestinal: Negative for abdominal pain, blood in stool, diarrhea, nausea and vomiting.  Genitourinary: Negative for difficulty urinating, dysuria, flank pain, frequency, hematuria and urgency.  Musculoskeletal: Negative for arthralgias, back pain, joint swelling, myalgias and neck stiffness.  Skin: Negative for pallor and rash.  Neurological: Negative for dizziness, speech difficulty, weakness and headaches.  Hematological: Negative for adenopathy. Does not bruise/bleed easily.  Psychiatric/Behavioral: Negative for confusion and sleep disturbance. The patient is not nervous/anxious.     PE; Blood pressure 107/72, pulse 60, temperature 98.2 F (36.8 C), temperature source Oral, resp. rate 16,  height 5\' 7"  (1.702 m), weight (!) 315 lb 4 oz (143 kg), SpO2 97 %. Body mass index is 49.38 kg/m.  Pt examined with Helayne Seminole, CMA, as chaperone.   Gen: Alert, well appearing.  Patient is oriented to person, place, time, and situation. AFFECT: pleasant, lucid thought and speech. ENT: Ears: EACs clear, normal epithelium.  TMs with good light reflex and landmarks bilaterally.  Eyes: no injection, icteris, swelling, or exudate.  EOMI, PERRLA. Nose: no drainage or turbinate edema/swelling.  No injection or focal lesion.  Mouth: lips without lesion/swelling.  Oral mucosa pink and moist.  Dentition intact and without obvious caries or gingival swelling.  Oropharynx without erythema, exudate, or swelling.  Neck: supple/nontender.  No LAD, mass, or TM.  Carotid pulses 2+ bilaterally, without bruits. CV: RRR, no m/r/g.   LUNGS: CTA bilat, nonlabored resps, good aeration in all lung fields. ABD: soft, NT, ND, BS normal.  No hepatospenomegaly or mass.  No bruits. EXT: no clubbing, cyanosis, or edema.  Musculoskeletal: no joint swelling, erythema, warmth, or tenderness.  ROM of all joints intact. Skin - no sores or suspicious lesions or rashes or color changes Foot exam -  no swelling, tenderness or skin or vascular lesions. Color and temperature is normal. Sensation is intact. Peripheral pulses are palpable. Toenails are thickened/mycotic, and she has dry/flaky/pinkish rash on bottoms and sides of feet in moccasin-type distribution.   Pertinent labs:  Lab Results  Component Value Date   TSH 1.09 06/20/2017   Lab Results  Component Value Date   WBC 13.0 (H) 10/31/2018   HGB 8.9 (L) 10/31/2018   HCT 29.0 (L) 10/31/2018   MCV 85.5 10/31/2018   PLT 447 (H) 10/31/2018   Lab Results  Component Value Date   CREATININE 0.73 10/30/2018   BUN <5 (L) 10/30/2018   NA 140 10/30/2018   K 3.4 (L) 10/30/2018   CL 103 10/30/2018   CO2 30 10/30/2018   Lab Results  Component Value Date   ALT  25 10/26/2018   AST 20 10/26/2018   ALKPHOS 90 10/26/2018   BILITOT 0.5 10/26/2018  Lab Results  Component Value Date   CHOL 199 06/20/2017   Lab Results  Component Value Date   HDL 84.00 06/20/2017   Lab Results  Component Value Date   LDLCALC 92 06/20/2017   Lab Results  Component Value Date   TRIG 148 10/24/2018   Lab Results  Component Value Date   CHOLHDL 2 06/20/2017   Lab Results  Component Value Date   HGBA1C 5.6 10/10/2018    ASSESSMENT AND PLAN:   Health maintenance exam: Reviewed age and gender appropriate health maintenance issues (prudent diet, regular exercise, health risks of tobacco and excessive alcohol, use of seatbelts, fire alarms in home, use of sunscreen).  Also reviewed age and gender appropriate health screening as well as vaccine recommendations. Vaccines: pneumovax--->pneumovax 23 given today. Labs: fasting HP + A1c Cervical ca screening: not applicable-->pt has had hysterectomy for benign dx, no hx of abnormal paps. Breast ca screening: mammogram normal yesterday. Bone density screening: pt defers dexa until after age 83 and on medicare. Colon ca screening: per pt: colonoscopy in [place] 2008 (or 2011) was normal.  She is due for repeat screening colonoscopy--> GI consulted 09/2017, was going to get records and see when repeat screening needed. Pt prefers colonoscopy when it is time.  Will have our administrative staff check with GI to see where we are with this.  OSA-->seeing specialist, needs CPAP but she cannot afford it. Dr. Elsworth Soho will be doing PFTs on her soon to f/u her asthma.  An After Visit Summary was printed and given to the patient.  FOLLOW UP:  Return in about 3 months (around 04/25/2019) for routine chronic illness f/u.  Signed:  Crissie Sickles, MD           01/25/2019

## 2019-01-25 NOTE — Telephone Encounter (Signed)
Left message for patient that prior GI records from 11/18 will be placed on the physician's desk for review. Advised patient to call [facility] if she does not hear from them in 2 weeks.

## 2019-01-25 NOTE — Patient Instructions (Signed)

## 2019-01-26 ENCOUNTER — Encounter: Payer: Self-pay | Admitting: Family Medicine

## 2019-01-28 ENCOUNTER — Encounter: Payer: Self-pay | Admitting: *Deleted

## 2019-02-15 ENCOUNTER — Other Ambulatory Visit: Payer: Self-pay | Admitting: Internal Medicine

## 2019-02-15 DIAGNOSIS — J4520 Mild intermittent asthma, uncomplicated: Secondary | ICD-10-CM

## 2019-02-18 ENCOUNTER — Ambulatory Visit (INDEPENDENT_AMBULATORY_CARE_PROVIDER_SITE_OTHER): Payer: BLUE CROSS/BLUE SHIELD | Admitting: Pulmonary Disease

## 2019-02-18 ENCOUNTER — Encounter: Payer: Self-pay | Admitting: Pulmonary Disease

## 2019-02-18 DIAGNOSIS — G4733 Obstructive sleep apnea (adult) (pediatric): Secondary | ICD-10-CM

## 2019-02-18 DIAGNOSIS — J4520 Mild intermittent asthma, uncomplicated: Secondary | ICD-10-CM | POA: Diagnosis not present

## 2019-02-18 LAB — PULMONARY FUNCTION TEST
DL/VA % pred: 138 %
DL/VA: 5.67 ml/min/mmHg/L
DLCO UNC: 23.03 ml/min/mmHg
DLCO cor % pred: 104 %
DLCO cor: 23.17 ml/min/mmHg
DLCO unc % pred: 104 %
FEF 25-75 Post: 2.18 L/sec
FEF 25-75 Pre: 1.62 L/sec
FEF2575-%Change-Post: 34 %
FEF2575-%Pred-Post: 100 %
FEF2575-%Pred-Pre: 74 %
FEV1-%CHANGE-POST: 5 %
FEV1-%PRED-POST: 84 %
FEV1-%Pred-Pre: 80 %
FEV1-PRE: 1.84 L
FEV1-Post: 1.95 L
FEV1FVC-%Change-Post: 2 %
FEV1FVC-%Pred-Pre: 101 %
FEV6-%Change-Post: 3 %
FEV6-%PRED-POST: 84 %
FEV6-%Pred-Pre: 81 %
FEV6-POST: 2.38 L
FEV6-PRE: 2.3 L
FEV6FVC-%CHANGE-POST: 0 %
FEV6FVC-%PRED-POST: 103 %
FEV6FVC-%Pred-Pre: 103 %
FVC-%CHANGE-POST: 3 %
FVC-%Pred-Post: 81 %
FVC-%Pred-Pre: 78 %
FVC-Post: 2.38 L
FVC-Pre: 2.3 L
POST FEV6/FVC RATIO: 100 %
PRE FEV6/FVC RATIO: 100 %
Post FEV1/FVC ratio: 82 %
Pre FEV1/FVC ratio: 80 %
RV % pred: 74 %
RV: 1.64 L
TLC % pred: 78 %
TLC: 4.34 L

## 2019-02-18 MED ORDER — BECLOMETHASONE DIPROPIONATE 40 MCG/ACT IN AERS: 2.0000 | INHALATION_SPRAY | Freq: Every day | RESPIRATORY_TRACT | 3 refills | Status: DC

## 2019-02-18 NOTE — Assessment & Plan Note (Signed)
Appears moderate. She has lost 16 pounds since her sleep study.  She was unable to get CPAP since this was too expensive.  She will not qualify for financial assistance. Weight loss encouraged, she will try to obtain CPAP whenever it is financially possible for her  Advised against medications with sedative side effects Cautioned against driving when sleepy - understanding that sleepiness will vary on a day to day basis

## 2019-02-18 NOTE — Progress Notes (Signed)
Subjective:    Patient ID: Taylor Leon, female    DOB: [birth year], 63 y.o.   MRN: [id number]  HPI  63 year old morbidly obese woman for FU of OSA & chronic cough  She reports a chronic cough and intermittent wheezing since she moved from [place] to [place] in 2012.  She underwent ENT evaluation which was negative.  She saw Dr. Ishmael Holter allergist and was diagnosed with "allergy induced asthma" and started on albuterol and Qvar inhaler which she self discontinued.  She had a flareup in 08/2018 requiring prednisone taper by her PCP. She was admitted 10/19/2018 for pneumonia and parapneumonic effusion and underwent VATS thoracotomy.  She has mostly recovered from this except for nagging pain in her left chest.  She reports significant postnasal drip.  Cough has improved.  She wonders if she should start back on Qvar. PFTs were reviewed today  We also reviewed sleep study today.  Prescription was sent for auto CPAP but she states that she cannot financially afford this    Significant tests/ events reviewed  11/2018 NPSG -  Wt 328 lbs  -mod OSA -RDI 21/h, AHI 9/h  Past Medical History:  Diagnosis Date  . BPPV (benign paroxysmal positional vertigo) 01/30/2014  . CAP (community acquired pneumonia) 10/2018   with acute hypoxic RF, empyema-->VATS  . Cyst, breast 01/2012; 06/2012; 01/26/18; 03/26/73   Complicated cysts in upper outer left breast--no evidence of malignancy--f/u bilat diag mammo/left breast u/s on 03/06/15 showed resolution of left breast cysts.  Repeat screening mammogram 1 yr.  . Diabetes mellitus without complication (HCC)    Pre-diabetes  . Empyema ([place])    drained by CT surgery/VATS.  Full recovery as of 12/2018 CT surgery f/u visit.  . H/O allergic rhinitis    Dr. Ishmael Holter  . History of pneumonia 2009 and 2010   supplemential oxygen d/c'd 12/16/18 by pulm  . Mild persistent asthma, well controlled 2014   New adult onset asthma after respiratory infection (Dr. Ishmael Holter)  .  Morbid obesity ([place])    BMI 50+.  At one point in time she was going to Bariatric clinic on [address] and got weekly HCG injections, monthly vit B12 injections, and took phentermine daily.  As of 05/2015 she was no longer doing this.  . Nephrolithiasis 04/2018 first episode   [facility] 1.66mm right UVJ stone + 6 mm L AML.  Pt elected for trial of passage.  . OSA (obstructive sleep apnea) 10/2018   Mild/mod OSA 12/2018--pt not able to afford CPAP as of 01/2019.  . Osteoarthritis of both knees    No improvement with steroid injections; did synvisc x 3 yrs; bilat replacement was recommended but she declined.  . Recurrent low back pain      PFTs 01/2019 >> FEV1 80%, ratio 80, no airway obstruction, TLC 78%, DLCO 104%  Review of Systems neg for any significant sore throat, dysphagia, itching, sneezing, nasal congestion or excess/ purulent secretions, fever, chills, sweats, unintended wt loss, pleuritic or exertional cp, hempoptysis, orthopnea pnd or change in chronic leg swelling. Also denies presyncope, palpitations, heartburn, abdominal pain, nausea, vomiting, diarrhea or change in bowel or urinary habits, dysuria,hematuria, rash, arthralgias, visual complaints, headache, numbness weakness or ataxia.     Objective:   Physical Exam   Gen. Pleasant, obese, in no distress ENT - no lesions, no post nasal drip Neck: No JVD, no thyromegaly, no carotid bruits Lungs: no use of accessory muscles, no dullness to percussion, decreased without  rales or rhonchi  Cardiovascular: Rhythm regular, heart sounds  normal, no murmurs or gallops, no peripheral edema Musculoskeletal: No deformities, no cyanosis or clubbing , no tremors        Assessment & Plan:

## 2019-02-18 NOTE — Assessment & Plan Note (Signed)
We will treat as allergy induced asthma Take Qvar 40- 2 puffs once daily, rinse mouth after use Take from March until May and then again from September until November end   She has albuterol to take as needed. She will call in case of a flareup

## 2019-02-18 NOTE — Patient Instructions (Signed)
Prescription for Qvar will be sent to pharmacy. Take Qvar 40- 2 puffs once daily, rinse mouth after use Take from March until May and then again from September until November end   Obtain CPAP whenever financially possible. Congratulations on your weight loss!

## 2019-02-18 NOTE — Progress Notes (Signed)
Full PFT performed today. °

## 2019-02-21 ENCOUNTER — Telehealth: Payer: Self-pay | Admitting: Pulmonary Disease

## 2019-02-21 MED ORDER — BECLOMETHASONE DIPROP HFA 40 MCG/ACT IN AERB: 2.0000 | INHALATION_SPRAY | Freq: Two times a day (BID) | RESPIRATORY_TRACT | 5 refills | Status: DC

## 2019-02-21 NOTE — Telephone Encounter (Signed)
Pt is calling about the Rx for Qvar. Pt states that she was taking Qvar 80. Pharm [facility]. Cb is [phone]

## 2019-02-21 NOTE — Telephone Encounter (Signed)
Spoke with pt and advised her that I sent in the Rx for redihaler to [place]. Pt understood and nothing further is needed.

## 2019-03-04 ENCOUNTER — Telehealth: Payer: Self-pay | Admitting: Pulmonary Disease

## 2019-03-04 NOTE — Telephone Encounter (Signed)
Dr. Elsworth Soho please advise on patients e-mail below in regards to her cruise.   Thank you.

## 2019-03-04 NOTE — Telephone Encounter (Signed)
The "commonsense" advise at this time until more information is apparent is not to take the cruise Please provide a letter as requested

## 2019-03-04 NOTE — Telephone Encounter (Signed)
Dr. Elsworth Soho, please advise what all we should state in the letter for pt. Thanks!

## 2019-03-04 NOTE — Telephone Encounter (Signed)
Called and spoke with patient about the mychart message sent earlier. Patient she is not sure what the letter should say.   RA please advise, thank you.

## 2019-03-05 NOTE — Telephone Encounter (Signed)
LVMTCB x 1 for patient. Requested in vmail left that she contact our office to let us know if she needs the letter mailed, or will she pick it up from the office as Dr. Elsworth Soho does not recommend travel at this time.

## 2019-03-05 NOTE — Telephone Encounter (Signed)
Will await on response from RA in regards to the travel letter.

## 2019-03-05 NOTE — Telephone Encounter (Signed)
Called patient, unable to reach as phone went straight to busy signal. Will call back.

## 2019-03-05 NOTE — Telephone Encounter (Signed)
She is under our care for allergy induced asthma and OSA.  She is advised not to travel on a cruise due to recent pandemic of coronavirus.  Please help her in any way possible

## 2019-03-06 NOTE — Telephone Encounter (Signed)
ATC Patient. Phone rings over 10 times, no answer or VM.  Will try again at a later time.

## 2019-03-07 ENCOUNTER — Encounter: Payer: Self-pay | Admitting: *Deleted

## 2019-03-07 NOTE — Telephone Encounter (Signed)
Patient reached out to [facility] through [employer].  Letter mailed to Patient. See 03/03/19,pt message.

## 2019-03-07 NOTE — Telephone Encounter (Signed)
Lett printed and mailed to Patient.  Message sent to Patient to make her aware.  Nothing further at this time.

## 2019-03-20 NOTE — Telephone Encounter (Signed)
The virus was not known to be in the [place] in October, or even in [place] for that matter. Her immune system should be normal

## 2019-04-15 ENCOUNTER — Telehealth: Payer: Self-pay | Admitting: Family Medicine

## 2019-04-15 MED ORDER — METFORMIN HCL 1000 MG PO TABS: ORAL_TABLET | ORAL | 6 refills | Status: DC

## 2019-04-15 NOTE — Telephone Encounter (Signed)
SW pt this afternoon regarding testing and advised until we have more info and details will not be doing test. Advised pt unsure cost of test also and she said according to [facility] and her understanding from it, the test would be free. RX refill sent for Metformin. Pt made aware.

## 2019-04-15 NOTE — Telephone Encounter (Signed)
Called patient to reschedule office visit. Patient did not schedule due to the amount of money she owes for past medical bills.  Patient did ask to check with Dr. Anitra Lauth to see if she can get the antibody Coronavirus blood test done.

## 2019-04-23 DIAGNOSIS — Z20828 Contact with and (suspected) exposure to other viral communicable diseases: Secondary | ICD-10-CM

## 2019-04-23 DIAGNOSIS — Z20822 Contact with and (suspected) exposure to covid-19: Secondary | ICD-10-CM

## 2019-04-24 NOTE — Telephone Encounter (Signed)
Yes. Please order test. Thanks.

## 2019-04-24 NOTE — Telephone Encounter (Signed)
This patient sent this message this morning. Patient is seen by Dr Elsworth Soho.  New details concerning facts and symptoms of COVID 19 continue to evolve...one being the virus was thought to be active last fall!     Considering everything we've learned the past few months about the CORONAVIRUS, I truly believe there is good chance I had the virus!     With that being said, I'd like to have the ANTIBODY TEST. If the test is not available at your office, can I please have a prescription to secure the test at a public lab.     Thank you  Taylor Leon    Message routed to [place], NP App of the day

## 2019-04-25 ENCOUNTER — Ambulatory Visit: Payer: BLUE CROSS/BLUE SHIELD | Admitting: Family Medicine

## 2019-04-25 ENCOUNTER — Other Ambulatory Visit: Payer: BLUE CROSS/BLUE SHIELD

## 2019-04-25 DIAGNOSIS — Z20828 Contact with and (suspected) exposure to other viral communicable diseases: Secondary | ICD-10-CM

## 2019-04-25 DIAGNOSIS — Z20822 Contact with and (suspected) exposure to covid-19: Secondary | ICD-10-CM

## 2019-04-26 LAB — SAR COV2 SEROLOGY (COVID19)AB(IGG),IA: SARS CoV2 AB IGG: NEGATIVE

## 2019-07-19 DIAGNOSIS — Z20828 Contact with and (suspected) exposure to other viral communicable diseases: Secondary | ICD-10-CM | POA: Diagnosis not present

## 2019-07-26 ENCOUNTER — Encounter: Payer: Self-pay | Admitting: Family Medicine

## 2019-07-26 ENCOUNTER — Other Ambulatory Visit: Payer: Self-pay

## 2019-07-26 ENCOUNTER — Ambulatory Visit (INDEPENDENT_AMBULATORY_CARE_PROVIDER_SITE_OTHER): Payer: BC Managed Care – PPO | Admitting: Family Medicine

## 2019-07-26 VITALS — BP 103/64 | HR 67 | Temp 98.3°F | Resp 16 | Ht 67.0 in | Wt 330.0 lb

## 2019-07-26 DIAGNOSIS — E119 Type 2 diabetes mellitus without complications: Secondary | ICD-10-CM

## 2019-07-26 LAB — POCT GLYCOSYLATED HEMOGLOBIN (HGB A1C)
HbA1c POC (<> result, manual entry): 5.7 % (ref 4.0–5.6)
HbA1c, POC (controlled diabetic range): 5.7 % (ref 0.0–7.0)
HbA1c, POC (prediabetic range): 5.7 % (ref 5.7–6.4)
Hemoglobin A1C: 5.7 % — AB (ref 4.0–5.6)

## 2019-07-26 MED ORDER — METFORMIN HCL 1000 MG PO TABS: ORAL_TABLET | ORAL | 3 refills | Status: DC

## 2019-07-26 NOTE — Progress Notes (Signed)
OFFICE VISIT  07/26/2019   CC:  Chief Complaint  Patient presents with  . Follow-up    RCI, pt is not fasting   HPI:    Patient is a 63 y.o. African-American female who presents for 6 mo f/u DM 2. Last visit all labs were excellent, A1c was 5.8%.   Highest glucose in the last month has been 117.  She attributes this to taking biotin and apple cider vinegar daily during this time.  Gluc 120s-140s in the months prior. Says she is sitting around not doing anything and eating BAD.  Has gained 17 lbs in the last 5 mo.  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 No vacation: has had to cancel 2 cruises b/c of covid 19 restrictions. Plans on going to [facility] with family in a couple weeks.  ROS: no CP, no SOB, no wheezing, no cough, no dizziness, no HAs, no rashes, no melena/hematochezia.  No polyuria or polydipsia.  No myalgias or arthralgias.  Past Medical History:  Diagnosis Date  . BPPV (benign paroxysmal positional vertigo) 01/30/2014  . CAP (community acquired pneumonia) 10/2018   with acute hypoxic RF, empyema-->VATS  . Cyst, breast 01/2012; 06/2012; 02/02/77; 06/01/66   Complicated cysts in upper outer left breast--no evidence of malignancy--f/u bilat diag mammo/left breast u/s on 03/06/15 showed resolution of left breast cysts.  Repeat screening mammogram 1 yr.  . Diabetes mellitus without complication (HCC)    Pre-diabetes  . Empyema ([place])    drained by CT surgery/VATS.  Full recovery as of 12/2018 CT surgery f/u visit.  . H/O allergic rhinitis    Dr. Ishmael Holter  . History of pneumonia 2009 and 2010   supplemential oxygen d/c'd 12/16/18 by pulm  . Mild persistent asthma, well controlled 2014   New adult onset asthma after respiratory infection (Dr. Ishmael Holter)  . Morbid obesity ([place])    BMI 50+.  At one point in time she was going to Bariatric clinic on [address] and got weekly HCG injections, monthly vit B12 injections, and took phentermine daily.  As of 05/2015 she was no  longer doing this.  . Nephrolithiasis 04/2018 first episode   [facility] Neighbours ([facility]): 1.23mm right UVJ stone + 6 mm L AML.  Pt elected for trial of passage.  . OSA (obstructive sleep apnea) 10/2018   Mild/mod OSA 12/2018--pt not able to afford CPAP as of 01/2019.  . Osteoarthritis of both knees    No improvement with steroid injections; did synvisc x 3 yrs; bilat replacement was recommended but she declined.  . Recurrent low back pain     Past Surgical History:  Procedure Laterality Date  . APPENDECTOMY  1978  . CARDIAC CATHETERIZATION  2010   Normal coronaries per pt  . CARDIOVASCULAR STRESS TEST  2010   abnl per pt; f/u cath clean  . CESAREAN SECTION     x 3  . CHOLECYSTECTOMY  1986  . COLONOSCOPY  2008   normal per pt report (in [place])  . EMPYEMA DRAINAGE Left 10/24/2018   Evacuation of empyema with decortication.  Procedure: EMPYEMA DRAINAGE;  Surgeon: Grace Isaac, MD;  Location: [facility];  Service: Thoracic;  Laterality: Left;  . LUMBAR DISC SURGERY  1993   L5/S1  . TONSILLECTOMY AND ADENOIDECTOMY  1967ish  . TOTAL ABDOMINAL HYSTERECTOMY  1994   Ovaries are still in.  This was done for DUB/fibroids.  Marland Kitchen VIDEO ASSISTED THORACOSCOPY (VATS)/THOROCOTOMY Left 10/24/2018   Procedure: VIDEO ASSISTED THORACOSCOPY (  VATS)/THOROCOTOMY with EVACUATION OF EMPYEMA AND DECORTICATION.;  Surgeon: Grace Isaac, MD;  Location: [facility];  Service: Thoracic;  Laterality: Left;  Marland Kitchen VIDEO BRONCHOSCOPY N/A 10/24/2018   Procedure: VIDEO BRONCHOSCOPY;  Surgeon: Grace Isaac, MD;  Location: [facility] OR;  Service: Thoracic;  Laterality: N/A;    Outpatient Medications Prior to Visit  Medication Sig Dispense Refill  . albuterol (PROVENTIL) (2.5 MG/3ML) 0.083% nebulizer solution Take 3 mLs (2.5 mg total) by nebulization every 6 (six) hours as needed for wheezing. 45 mL 1  . albuterol (VENTOLIN HFA) 108 (90 Base) MCG/ACT inhaler Inhale 2 puffs into the lungs every 4 (four) hours as needed for wheezing. 1  Inhaler 1  . APPLE CIDER VINEGAR PO Take 600 mg by mouth daily.    . Ascorbic Acid (VITAMIN C) 1000 MG tablet Take 1,000 mg by mouth daily.    Marland Kitchen BAYER MICROLET LANCETS lancets Use to check blood sugar once daily 100 each 11  . beclomethasone (QVAR REDIHALER) 40 MCG/ACT inhaler Inhale 2 puffs into the lungs 2 (two) times daily. 1 Inhaler 5  . Biotin (BIOTIN MAXIMUM) 10000 MCG TBDP Take by mouth.    Marland Kitchen glucose blood (CONTOUR NEXT TEST) test strip Use to check blood sugar once daily 100 each 11  . Multiple Vitamin (MULTIVITAMIN) tablet Take 1 tablet by mouth daily.    . Turmeric (QC TUMERIC COMPLEX PO) Take by mouth.    . metFORMIN (GLUCOPHAGE) 1000 MG tablet 1 tab po bid with meals 60 tablet 6  . meloxicam (MOBIC) 7.5 MG tablet 1-2 tabs po qd prn arthritis pain (Patient not taking: Reported on 07/26/2019) 60 tablet 6   No facility-administered medications prior to visit.     Allergies  Allergen Reactions  . Penicillins Nausea Only and Rash    Has patient had a PCN reaction causing immediate rash, facial/tongue/throat swelling, SOB or lightheadedness with hypotension: yES Has patient had a PCN reaction causing severe rash involving mucus membranes or skin necrosis: No Has patient had a PCN reaction that required hospitalization: No Has patient had a PCN reaction occurring within the last 10 years: No If all of the above answers are "NO", then may proceed with Cephalosporin use. CC    ROS As per HPI  PE: Blood pressure 103/64, pulse 67, temperature 98.3 F (36.8 C), temperature source Temporal, resp. rate 16, height 5\' 7"  (1.702 m), weight (!) 330 lb (149.7 kg), SpO2 96 %. Gen: Alert, well appearing.  Patient is oriented to person, place, time, and situation. AFFECT: pleasant, lucid thought and speech. No further exam today.  LABS:  Lab Results  Component Value Date   TSH 1.54 01/25/2019   Lab Results  Component Value Date   WBC 7.2 01/25/2019   HGB 13.2 01/25/2019   HCT 41.3  01/25/2019   MCV 82.0 01/25/2019   PLT 303.0 01/25/2019   Lab Results  Component Value Date   CREATININE 0.79 01/25/2019   BUN 14 01/25/2019   NA 140 01/25/2019   K 4.3 01/25/2019   CL 104 01/25/2019   CO2 30 01/25/2019   Lab Results  Component Value Date   ALT 12 01/25/2019   AST 13 01/25/2019   ALKPHOS 63 01/25/2019   BILITOT 0.4 01/25/2019   Lab Results  Component Value Date   CHOL 187 01/25/2019   Lab Results  Component Value Date   HDL 76.70 01/25/2019   Lab Results  Component Value Date   LDLCALC 85 01/25/2019   Lab  Results  Component Value Date   TRIG 123.0 01/25/2019   Lab Results  Component Value Date   CHOLHDL 2 01/25/2019   Lab Results  Component Value Date   HGBA1C 5.7 (A) 07/26/2019   HGBA1C 5.7 07/26/2019   HGBA1C 5.7 07/26/2019   HGBA1C 5.7 07/26/2019   POC HbA1c today= 5.7%  IMPRESSION AND PLAN:  DM 2, very well controlled. Continue metformin [phone] mg bid. Work on diet/exercise/wt loss.  Vaccines UTD.  An After Visit Summary was printed and given to the patient.  FOLLOW UP: Return in about 6 months (around 01/26/2020) for annual CPE (fasting).  Signed:  Crissie Sickles, MD           07/26/2019

## 2019-08-19 DIAGNOSIS — J869 Pyothorax without fistula: Secondary | ICD-10-CM

## 2019-08-19 DIAGNOSIS — J4520 Mild intermittent asthma, uncomplicated: Secondary | ICD-10-CM

## 2019-08-19 NOTE — Telephone Encounter (Signed)
Okay to obtain chest x-ray prior to visit, follow-up left basal scarring

## 2019-08-19 NOTE — Telephone Encounter (Signed)
RA please advise if pt needs a cxr before her appt on Friday.  Thanks!   DOB - 2056-03-21  I have a 6 month F/U appt this Friday, 8/28 at 9am. Should I plan to arrive by 8:30 such that I can get an X-ray of my left lung taken prior to seeing Dr Elsworth Soho? This would enable him to review and discuss my post op progress during my scheduled appt?     Please advise. Thank you

## 2019-08-23 ENCOUNTER — Encounter: Payer: Self-pay | Admitting: Pulmonary Disease

## 2019-08-23 ENCOUNTER — Ambulatory Visit (INDEPENDENT_AMBULATORY_CARE_PROVIDER_SITE_OTHER): Payer: BC Managed Care – PPO | Admitting: Pulmonary Disease

## 2019-08-23 ENCOUNTER — Other Ambulatory Visit: Payer: Self-pay

## 2019-08-23 ENCOUNTER — Ambulatory Visit (INDEPENDENT_AMBULATORY_CARE_PROVIDER_SITE_OTHER): Payer: BC Managed Care – PPO

## 2019-08-23 DIAGNOSIS — Z23 Encounter for immunization: Secondary | ICD-10-CM | POA: Diagnosis not present

## 2019-08-23 DIAGNOSIS — G4733 Obstructive sleep apnea (adult) (pediatric): Secondary | ICD-10-CM | POA: Diagnosis not present

## 2019-08-23 DIAGNOSIS — R918 Other nonspecific abnormal finding of lung field: Secondary | ICD-10-CM | POA: Diagnosis not present

## 2019-08-23 DIAGNOSIS — J4530 Mild persistent asthma, uncomplicated: Secondary | ICD-10-CM

## 2019-08-23 DIAGNOSIS — J869 Pyothorax without fistula: Secondary | ICD-10-CM | POA: Diagnosis not present

## 2019-08-23 MED ORDER — QVAR REDIHALER 40 MCG/ACT IN AERB: 2.0000 | INHALATION_SPRAY | Freq: Two times a day (BID) | RESPIRATORY_TRACT | 2 refills | Status: DC

## 2019-08-23 MED ORDER — ALBUTEROL SULFATE HFA 108 (90 BASE) MCG/ACT IN AERS: 2.0000 | INHALATION_SPRAY | RESPIRATORY_TRACT | 3 refills | Status: DC | PRN

## 2019-08-23 NOTE — Patient Instructions (Signed)
  Refills on Qvar and albuterol. Flu shot today Call us as needed

## 2019-08-23 NOTE — Assessment & Plan Note (Signed)
Current strategy of using Qvar during spring and fall seems to be working Her albuterol MDI seems to be expired and we will renew Discussed signs and symptoms of flareup and she will call us as needed

## 2019-08-23 NOTE — Assessment & Plan Note (Signed)
She prefers to hold off on treatment.  Her husband has OSA and uses CPAP machine.  She will wait until she gets Medicare

## 2019-08-23 NOTE — Progress Notes (Signed)
Subjective:    Patient ID: Taylor Leon, female    DOB: Aug 14, 1956, 63 y.o.   MRN: [id number]  HPI  63 year old morbidly obese woman for FU of OSA & chronic cough  She reports a chronic cough and intermittent wheezing since she moved from [place] to [place] in 2012. She underwent ENT evaluation which was negative. She saw Dr. Ishmael Holter allergist and was diagnosed with "allergy induced asthma" and started on albuterol and Qvar inhaler which she self discontinued. Last flareup was in 08/2018.  She currently uses Qvar during spring and fall.  She was admitted 10/19/2018 for pneumonia and parapneumonic effusion and underwent VATS thoracotomy.    Used to have nagging left-sided chest pain not related to exertion  Chest x-ray repeated today which shows mild residual left lower lobe scarring  6 months have been uneventful, she has been tested negative for COVID also had antibody testing in April which was negative, cancel the cruise  Has been social distancing, unable to lose weight because of this  Her albuterol MDI has run out, no asthma flareups no wheezing or nocturnal symptoms    CXR today clear, residual LLL scarring  Significant tests/ events reviewed  11/2018 NPSG -  Wt 328 lbs  -mod OSA -RDI 21/h, AHI 9/h  PFTs 01/2019 nml  Past Medical History:  Diagnosis Date  . BPPV (benign paroxysmal positional vertigo) 01/30/2014  . CAP (community acquired pneumonia) 10/2018   with acute hypoxic RF, empyema-->VATS  . Cyst, breast 01/2012; 06/2012; 0000000; [id number]   Complicated cysts in upper outer left breast--no evidence of malignancy--f/u bilat diag mammo/left breast u/s on 03/06/15 showed resolution of left breast cysts.  Repeat screening mammogram 1 yr.  . Diabetes mellitus without complication (HCC)    Pre-diabetes  . Empyema ([place])    drained by CT surgery/VATS.  Full recovery as of 12/2018 CT surgery f/u visit.  . H/O allergic rhinitis    Dr. Ishmael Holter  . History of pneumonia  2009 and 2010   supplemential oxygen d/c'd 12/16/18 by pulm  . Mild persistent asthma, well controlled 2014   New adult onset asthma after respiratory infection (Dr. Ishmael Holter)  . Morbid obesity ([place])    BMI 50+.  At one point in time she was going to Bariatric clinic on [address] and got weekly HCG injections, monthly vit B12 injections, and took phentermine daily.  As of 05/2015 she was no longer doing this.  . Nephrolithiasis 04/2018 first episode   [facility] 1.51mm right UVJ stone + 6 mm L AML.  Pt elected for trial of passage.  . OSA (obstructive sleep apnea) 10/2018   Mild/mod OSA 12/2018--pt not able to afford CPAP as of 01/2019.  . Osteoarthritis of both knees    No improvement with steroid injections; did synvisc x 3 yrs; bilat replacement was recommended but she declined.  . Recurrent low back pain      Review of Systems neg for any significant sore throat, dysphagia, itching, sneezing, nasal congestion or excess/ purulent secretions, fever, chills, sweats, unintended wt loss, pleuritic or exertional cp, hempoptysis, orthopnea pnd or change in chronic leg swelling. Also denies presyncope, palpitations, heartburn, abdominal pain, nausea, vomiting, diarrhea or change in bowel or urinary habits, dysuria,hematuria, rash, arthralgias, visual complaints, headache, numbness weakness or ataxia.     Objective:   Physical Exam  Gen. Pleasant, obese, in no distress ENT - no lesions, no post nasal drip Neck: No JVD, no thyromegaly, no carotid bruits  Lungs: no use of accessory muscles, no dullness to percussion, decreased without rales or rhonchi  Cardiovascular: Rhythm regular, heart sounds  normal, no murmurs or gallops, no peripheral edema Musculoskeletal: No deformities, no cyanosis or clubbing , no tremors       Assessment & Plan:

## 2019-08-26 ENCOUNTER — Telehealth: Payer: Self-pay | Admitting: Pulmonary Disease

## 2019-08-26 MED ORDER — QVAR REDIHALER 40 MCG/ACT IN AERB: 2.0000 | INHALATION_SPRAY | Freq: Two times a day (BID) | RESPIRATORY_TRACT | 0 refills | Status: AC

## 2019-08-26 NOTE — Telephone Encounter (Signed)
Returned call to patient.  Patient states she spoke with someone who was going to leave samples of Qvar 40 for her but when she inquired to pick them up, no samples were found.  Apologized to patient for confusion.  Will place samples at front desk today.  Patient states her husband will pick up. Nothing further needed.

## 2019-09-28 DIAGNOSIS — Z1159 Encounter for screening for other viral diseases: Secondary | ICD-10-CM | POA: Diagnosis not present

## 2019-11-13 ENCOUNTER — Telehealth: Payer: Self-pay | Admitting: Family Medicine

## 2019-11-13 DIAGNOSIS — E119 Type 2 diabetes mellitus without complications: Secondary | ICD-10-CM

## 2019-11-13 NOTE — Telephone Encounter (Signed)
OK, lab order entered.

## 2019-11-13 NOTE — Telephone Encounter (Signed)
Pt called stating she would like to come in Friday morning for A1C check. No orders in but she states she is to have done every 3 months. Please advise.

## 2019-11-13 NOTE — Telephone Encounter (Signed)
Patients last office visit was 07/26/2019. Next office visit is 01/28/2020. Please advise

## 2019-11-14 NOTE — Telephone Encounter (Signed)
Are we able to schedule this patient Friday for a lab? Please let me know, and I can let patient know. Thank you

## 2019-11-14 NOTE — Telephone Encounter (Signed)
Patient is coming in tomorrow at 8:30. Taylor Leon is scheduling appointment for her

## 2019-11-15 ENCOUNTER — Ambulatory Visit (INDEPENDENT_AMBULATORY_CARE_PROVIDER_SITE_OTHER): Payer: BC Managed Care – PPO | Admitting: Family Medicine

## 2019-11-15 ENCOUNTER — Other Ambulatory Visit: Payer: Self-pay

## 2019-11-15 DIAGNOSIS — E119 Type 2 diabetes mellitus without complications: Secondary | ICD-10-CM

## 2019-11-15 LAB — POCT GLYCOSYLATED HEMOGLOBIN (HGB A1C): Hemoglobin A1C: 5.6 % (ref 4.0–5.6)

## 2019-11-15 NOTE — Progress Notes (Signed)
Patient request POC A1C, opposed to send off.

## 2019-12-07 DIAGNOSIS — Z1159 Encounter for screening for other viral diseases: Secondary | ICD-10-CM | POA: Diagnosis not present

## 2020-01-25 DIAGNOSIS — Z20828 Contact with and (suspected) exposure to other viral communicable diseases: Secondary | ICD-10-CM | POA: Diagnosis not present

## 2020-01-28 ENCOUNTER — Encounter: Payer: BC Managed Care – PPO | Admitting: Family Medicine

## 2020-01-30 ENCOUNTER — Other Ambulatory Visit: Payer: Self-pay | Admitting: Family Medicine

## 2020-01-30 DIAGNOSIS — Z1231 Encounter for screening mammogram for malignant neoplasm of breast: Secondary | ICD-10-CM

## 2020-01-31 ENCOUNTER — Ambulatory Visit (INDEPENDENT_AMBULATORY_CARE_PROVIDER_SITE_OTHER): Payer: BC Managed Care – PPO | Admitting: Family Medicine

## 2020-01-31 ENCOUNTER — Encounter: Payer: Self-pay | Admitting: Family Medicine

## 2020-01-31 ENCOUNTER — Other Ambulatory Visit: Payer: Self-pay

## 2020-01-31 VITALS — BP 117/71 | HR 64 | Temp 97.1°F | Resp 17 | Ht 67.0 in | Wt 338.0 lb

## 2020-01-31 DIAGNOSIS — E119 Type 2 diabetes mellitus without complications: Secondary | ICD-10-CM

## 2020-01-31 DIAGNOSIS — Z1231 Encounter for screening mammogram for malignant neoplasm of breast: Secondary | ICD-10-CM | POA: Diagnosis not present

## 2020-01-31 DIAGNOSIS — Z0000 Encounter for general adult medical examination without abnormal findings: Secondary | ICD-10-CM | POA: Diagnosis not present

## 2020-01-31 DIAGNOSIS — Z1211 Encounter for screening for malignant neoplasm of colon: Secondary | ICD-10-CM

## 2020-01-31 LAB — CBC WITH DIFFERENTIAL/PLATELET
Basophils Absolute: 0 10*3/uL (ref 0.0–0.1)
Basophils Relative: 0.4 % (ref 0.0–3.0)
Eosinophils Absolute: 0.2 10*3/uL (ref 0.0–0.7)
Eosinophils Relative: 2.6 % (ref 0.0–5.0)
HCT: 39.8 % (ref 36.0–46.0)
Hemoglobin: 12.8 g/dL (ref 12.0–15.0)
Lymphocytes Relative: 27.9 % (ref 12.0–46.0)
Lymphs Abs: 2.5 10*3/uL (ref 0.7–4.0)
MCHC: 32.1 g/dL (ref 30.0–36.0)
MCV: 83.7 fl (ref 78.0–100.0)
Monocytes Absolute: 0.6 10*3/uL (ref 0.1–1.0)
Monocytes Relative: 6.4 % (ref 3.0–12.0)
Neutro Abs: 5.7 10*3/uL (ref 1.4–7.7)
Neutrophils Relative %: 62.7 % (ref 43.0–77.0)
Platelets: 282 10*3/uL (ref 150.0–400.0)
RBC: 4.76 Mil/uL (ref 3.87–5.11)
RDW: 15.8 % — ABNORMAL HIGH (ref 11.5–15.5)
WBC: 9 10*3/uL (ref 4.0–10.5)

## 2020-01-31 LAB — COMPREHENSIVE METABOLIC PANEL
ALT: 12 U/L (ref 0–35)
AST: 13 U/L (ref 0–37)
Albumin: 3.8 g/dL (ref 3.5–5.2)
Alkaline Phosphatase: 69 U/L (ref 39–117)
BUN: 16 mg/dL (ref 6–23)
CO2: 28 mEq/L (ref 19–32)
Calcium: 9.4 mg/dL (ref 8.4–10.5)
Chloride: 107 mEq/L (ref 96–112)
Creatinine, Ser: 0.88 mg/dL (ref 0.40–1.20)
GFR: 78.28 mL/min (ref >60.00)
Glucose, Bld: 98 mg/dL (ref 70–99)
Potassium: 4.2 mEq/L (ref 3.5–5.1)
Sodium: 143 mEq/L (ref 135–145)
Total Bilirubin: 0.4 mg/dL (ref 0.2–1.2)
Total Protein: 6.4 g/dL (ref 6.0–8.3)

## 2020-01-31 LAB — TSH: TSH: 1.18 u[IU]/mL (ref 0.35–4.50)

## 2020-01-31 LAB — LIPID PANEL
Cholesterol: 178 mg/dL (ref 0–200)
HDL: 79.6 mg/dL (ref >39.00)
LDL Cholesterol: 75 mg/dL (ref 0–99)
NonHDL: 98.64
Total CHOL/HDL Ratio: 2
Triglycerides: 116 mg/dL (ref 0.0–149.0)
VLDL: 23.2 mg/dL (ref 0.0–40.0)

## 2020-01-31 NOTE — Progress Notes (Signed)
Office Note 01/31/2020  CC:  Chief Complaint  Patient presents with  . Annual Exam    fasting    HPI:  Taylor Leon is a 64 y.o. Black female who is here for annual health maintenance exam. GYN MD-->none.  Jan 02, 2020-->pt got "[facility] Screening-->results all reassuring except LDL 100.  However, HDL was 88.   DM: glucoses consistently around 100 lately (last 1 mo).  Was not really consistently >120 prior.  If her glucose is <105 in morning she splits her metformin in half but still takes whole tab in evenings. NOT exercising at all since covid pandemic.  "We've worn our couch out". Says diet is fine.   Past Medical History:  Diagnosis Date  . BPPV (benign paroxysmal positional vertigo) 01/30/2014  . CAP (community acquired pneumonia) 10/2018   with acute hypoxic RF, empyema-->VATS  . Cyst, breast 01/2012; 06/2012; 0000000; [id number]   Complicated cysts in upper outer left breast--no evidence of malignancy--f/u bilat diag mammo/left breast u/s on 03/06/15 showed resolution of left breast cysts.  Repeat screening mammogram 1 yr.  . Diabetes mellitus without complication (HCC)    Pre-diabetes  . Empyema ([place])    drained by CT surgery/VATS.  Full recovery as of 12/2018 CT surgery f/u visit.  . H/O allergic rhinitis    Dr. Ishmael Holter  . Hepatic cyst 2019   Noted on CT chest imaging.  Multiple, small, "simple"  . History of pneumonia 2009 and 2010   supplemential oxygen d/c'd 12/16/18 by pulm  . Mild persistent asthma, well controlled 2014   New adult onset asthma after respiratory infection (Dr. Ishmael Holter)  . Morbid obesity ([place])    BMI 50+.  At one point in time she was going to Bariatric clinic on [address] and got weekly HCG injections, monthly vit B12 injections, and took phentermine daily.  As of 05/2015 she was no longer doing this.  . Nephrolithiasis 04/2018 first episode   Dr. Lovena Neighbours ([facility]): 1.37mm right UVJ stone + 6 mm L AML.  Pt elected for trial of passage.  . OSA  (obstructive sleep apnea) 10/2018   Mild/mod OSA 12/2018--pt not able to afford CPAP as of 01/2019.  . Osteoarthritis of both knees    No improvement with steroid injections; did synvisc x 3 yrs; bilat replacement was recommended but she declined.  . Recurrent low back pain     Past Surgical History:  Procedure Laterality Date  . APPENDECTOMY  1978  . CARDIAC CATHETERIZATION  2010   Normal coronaries per pt  . CARDIOVASCULAR STRESS TEST  2010   abnl per pt; f/u cath clean  . CESAREAN SECTION     x 3  . CHOLECYSTECTOMY  1986  . COLONOSCOPY  2008   normal per pt report (in [place])  . EMPYEMA DRAINAGE Left 10/24/2018   Evacuation of empyema with decortication.  Procedure: EMPYEMA DRAINAGE;  Surgeon: Grace Isaac, MD;  Location: [facility];  Service: Thoracic;  Laterality: Left;  . LUMBAR DISC SURGERY  1993   L5/S1  . TONSILLECTOMY AND ADENOIDECTOMY  1967ish  . TOTAL ABDOMINAL HYSTERECTOMY  1994   Ovaries are still in.  This was done for DUB/fibroids.  Marland Kitchen VIDEO ASSISTED THORACOSCOPY (VATS)/THOROCOTOMY Left 10/24/2018   Procedure: VIDEO ASSISTED THORACOSCOPY (VATS)/THOROCOTOMY with EVACUATION OF EMPYEMA AND DECORTICATION.;  Surgeon: Grace Isaac, MD;  Location: [facility];  Service: Thoracic;  Laterality: Left;  Marland Kitchen VIDEO BRONCHOSCOPY N/A 10/24/2018   Procedure: VIDEO BRONCHOSCOPY;  Surgeon: Lanelle Bal  B, MD;  Location: [facility];  Service: Thoracic;  Laterality: N/A;    Family History  Problem Relation Age of Onset  . Arthritis Mother   . Heart disease Mother 29  . Hypertension Mother   . Diabetes Mother   . Arthritis Paternal Grandmother   . Breast cancer Neg Hx     Social History   Socioeconomic History  . Marital status: Married    Spouse name: Not on file  . Number of children: 4  . Years of education: Not on file  . Highest education level: Not on file  Occupational History  . Not on file  Tobacco Use  . Smoking status: Former Smoker    Packs/day: 0.75    Years:  34.00    Pack years: 25.50    Types: Cigarettes    Start date: 08/1976    Quit date: 12/28/2009    Years since quitting: 10.0  . Smokeless tobacco: Never Used  Substance and Sexual Activity  . Alcohol use: Yes    Comment: social  . Drug use: No  . Sexual activity: Not on file  Other Topics Concern  . Not on file  Social History Narrative   Married, 4 grown children.   Relocated to [place], [place] from [place] about 2012.   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   Exercise: intermittently does water aerobics..            Social Determinants of Health   Financial Resource Strain:   . Difficulty of Paying Living Expenses: Not on file  Food Insecurity:   . Worried About Charity fundraiser in the Last Year: Not on file  . Ran Out of Food in the Last Year: Not on file  Transportation Needs:   . Lack of Transportation (Medical): Not on file  . Lack of Transportation (Non-Medical): Not on file  Physical Activity:   . Days of Exercise per Week: Not on file  . Minutes of Exercise per Session: Not on file  Stress:   . Feeling of Stress : Not on file  Social Connections:   . Frequency of Communication with Friends and Family: Not on file  . Frequency of Social Gatherings with Friends and Family: Not on file  . Attends Religious Services: Not on file  . Active Member of Clubs or Organizations: Not on file  . Attends Archivist Meetings: Not on file  . Marital Status: Not on file  Intimate Partner Violence:   . Fear of Current or Ex-Partner: Not on file  . Emotionally Abused: Not on file  . Physically Abused: Not on file  . Sexually Abused: Not on file    Outpatient Medications Prior to Visit  Medication Sig Dispense Refill  . albuterol (PROVENTIL) (2.5 MG/3ML) 0.083% nebulizer solution Take 3 mLs (2.5 mg total) by nebulization every 6 (six) hours as needed for wheezing. 45 mL 1  . albuterol  (VENTOLIN HFA) 108 (90 Base) MCG/ACT inhaler Inhale 2 puffs into the lungs every 4 (four) hours as needed for wheezing. 6.7 g 3  . APPLE CIDER VINEGAR PO Take 600 mg by mouth daily.    . Ascorbic Acid (VITAMIN C) 1000 MG tablet Take 1,000 mg by mouth daily.    Marland Kitchen BAYER MICROLET LANCETS lancets Use to check blood sugar once daily 100 each 11  . beclomethasone (QVAR REDIHALER) 40 MCG/ACT inhaler Inhale 2 puffs into the  lungs 2 (two) times daily. 10.6 g 2  . Biotin (BIOTIN MAXIMUM) 10000 MCG TBDP Take by mouth.    . Cholecalciferol (VITAMIN D3) 100000 UNIT/GM POWD by Does not apply route.    Marland Kitchen glucose blood (CONTOUR NEXT TEST) test strip Use to check blood sugar once daily 100 each 11  . metFORMIN (GLUCOPHAGE) 1000 MG tablet 1 tab po bid with meals 180 tablet 3  . Multiple Vitamin (MULTIVITAMIN) tablet Take 1 tablet by mouth daily.    . Turmeric (QC TUMERIC COMPLEX PO) Take by mouth.     No facility-administered medications prior to visit.    Allergies  Allergen Reactions  . Penicillins Nausea Only and Rash    Has patient had a PCN reaction causing immediate rash, facial/tongue/throat swelling, SOB or lightheadedness with hypotension: yES Has patient had a PCN reaction causing severe rash involving mucus membranes or skin necrosis: No Has patient had a PCN reaction that required hospitalization: No Has patient had a PCN reaction occurring within the last 10 years: No If all of the above answers are "NO", then may proceed with Cephalosporin use. CC    ROS Review of Systems  Constitutional: Negative for appetite change, chills, fatigue and fever.  HENT: Negative for congestion, dental problem, ear pain and sore throat.   Eyes: Negative for discharge, redness and visual disturbance.  Respiratory: Negative for cough, chest tightness, shortness of breath and wheezing.   Cardiovascular: Negative for chest pain, palpitations and leg swelling.  Gastrointestinal: Negative for abdominal pain,  blood in stool, diarrhea, nausea and vomiting.  Genitourinary: Negative for difficulty urinating, dysuria, flank pain, frequency, hematuria and urgency.  Musculoskeletal: Positive for arthralgias (chron bilat knee pain). Negative for back pain, joint swelling, myalgias and neck stiffness.  Skin: Negative for pallor and rash.  Neurological: Negative for dizziness, speech difficulty, weakness and headaches.  Hematological: Negative for adenopathy. Does not bruise/bleed easily.  Psychiatric/Behavioral: Negative for confusion and sleep disturbance. The patient is not nervous/anxious.     PE; Blood pressure 117/71, pulse 64, temperature (!) 97.1 F (36.2 C), temperature source Temporal, resp. rate 17, height 5\' 7"  (1.702 m), weight (!) 338 lb (153.3 kg), SpO2 97 %. Body mass index is 52.94 kg/m. Exam chaperoned by Jacob Moores, CMA. Gen: Alert, well appearing.  Patient is oriented to person, place, time, and situation. AFFECT: pleasant, lucid thought and speech. ENT: Ears: EACs clear, normal epithelium.  TMs with good light reflex and landmarks bilaterally.  Eyes: no injection, icteris, swelling, or exudate.  EOMI, PERRLA. Nose: no drainage or turbinate edema/swelling.  No injection or focal lesion.  Mouth: lips without lesion/swelling.  Oral mucosa pink and moist.  Dentition intact and without obvious caries or gingival swelling.  Oropharynx without erythema, exudate, or swelling.  Neck: supple/nontender.  No LAD, mass, or TM.  Carotid pulses 2+ bilaterally, without bruits. CV: RRR, no m/r/g.   LUNGS: CTA bilat, nonlabored resps, good aeration in all lung fields. ABD: soft, NT, ND, BS normal.  No hepatospenomegaly or mass.  No bruits. EXT: no clubbing, cyanosis, or edema.  Musculoskeletal: no joint swelling, erythema, warmth, or tenderness.  ROM of all joints intact. Skin - no sores or suspicious lesions or rashes or color changes  Pertinent labs:  Lab Results  Component Value Date   TSH  1.54 01/25/2019   Lab Results  Component Value Date   WBC 7.2 01/25/2019   HGB 13.2 01/25/2019   HCT 41.3 01/25/2019   MCV 82.0 01/25/2019  PLT 303.0 01/25/2019   Lab Results  Component Value Date   CREATININE 0.79 01/25/2019   BUN 14 01/25/2019   NA 140 01/25/2019   K 4.3 01/25/2019   CL 104 01/25/2019   CO2 30 01/25/2019   Lab Results  Component Value Date   ALT 12 01/25/2019   AST 13 01/25/2019   ALKPHOS 63 01/25/2019   BILITOT 0.4 01/25/2019   Lab Results  Component Value Date   CHOL 187 01/25/2019   Lab Results  Component Value Date   HDL 76.70 01/25/2019   Lab Results  Component Value Date   LDLCALC 85 01/25/2019   Lab Results  Component Value Date   TRIG 123.0 01/25/2019   Lab Results  Component Value Date   CHOLHDL 2 01/25/2019   Lab Results  Component Value Date   HGBA1C 5.6 11/15/2019    ASSESSMENT AND PLAN:   1) DM 2: glucoses excellent and she could back down on metformin dosing but she is fearful of doing this most of the time.  Ok to continue 1000 mg bid metformin. Next a1c not due until after 02/15/20. Fasting CMET today, as well as FLP.  2) Health maintenance exam: Reviewed age and gender appropriate health maintenance issues (prudent diet, regular exercise, health risks of tobacco and excessive alcohol, use of seatbelts, fire alarms in home, use of sunscreen).  Also reviewed age and gender appropriate health screening as well as vaccine recommendations. Vaccines: ALL utd. Labs:  Too early for repeat A1c.  Fasting HP labs ordered. Cervical ca screening:  She is s/p hysterectomy in 1994 for DUB, no malignancy.  No further paps. Breast ca screening: mammogram scheduled for 02/04/20. Colon ca screening:  +FH CC, also small rectal polyp on 'scope 2011.  Due for repeat colonoscopy at the end of this year-->she'll get this in 1 yr per her choice (she sites cost as the main factor-->due to her wt the procedure can't be done in the GI  office).  An After Visit Summary was printed and given to the patient.  FOLLOW UP:  Return in about 6 months (around 07/30/2020).  Signed:  Crissie Sickles, MD           01/31/2020

## 2020-01-31 NOTE — Patient Instructions (Signed)
Health Maintenance, Female Adopting a healthy lifestyle and getting preventive care are important in promoting health and wellness. Ask your health care provider about:  The right schedule for you to have regular tests and exams.  Things you can do on your own to prevent diseases and keep yourself healthy. What should I know about diet, weight, and exercise? Eat a healthy diet   Eat a diet that includes plenty of vegetables, fruits, low-fat dairy products, and lean protein.  Do not eat a lot of foods that are high in solid fats, added sugars, or sodium. Maintain a healthy weight Body mass index (BMI) is used to identify weight problems. It estimates body fat based on height and weight. Your health care provider can help determine your BMI and help you achieve or maintain a healthy weight. Get regular exercise Get regular exercise. This is one of the most important things you can do for your health. Most adults should:  Exercise for at least 150 minutes each week. The exercise should increase your heart rate and make you sweat (moderate-intensity exercise).  Do strengthening exercises at least twice a week. This is in addition to the moderate-intensity exercise.  Spend less time sitting. Even light physical activity can be beneficial. Watch cholesterol and blood lipids Have your blood tested for lipids and cholesterol at 64 years of age, then have this test every 5 years. Have your cholesterol levels checked more often if:  Your lipid or cholesterol levels are high.  You are older than 64 years of age.  You are at high risk for heart disease. What should I know about cancer screening? Depending on your health history and family history, you may need to have cancer screening at various ages. This may include screening for:  Breast cancer.  Cervical cancer.  Colorectal cancer.  Skin cancer.  Lung cancer. What should I know about heart disease, diabetes, and high blood  pressure? Blood pressure and heart disease  High blood pressure causes heart disease and increases the risk of stroke. This is more likely to develop in people who have high blood pressure readings, are of African descent, or are overweight.  Have your blood pressure checked: ? Every 3-5 years if you are 18-39 years of age. ? Every year if you are 40 years old or older. Diabetes Have regular diabetes screenings. This checks your fasting blood sugar level. Have the screening done:  Once every three years after age 40 if you are at a normal weight and have a low risk for diabetes.  More often and at a younger age if you are overweight or have a high risk for diabetes. What should I know about preventing infection? Hepatitis B If you have a higher risk for hepatitis B, you should be screened for this virus. Talk with your health care provider to find out if you are at risk for hepatitis B infection. Hepatitis C Testing is recommended for:  Everyone born from 1945 through 1965.  Anyone with known risk factors for hepatitis C. Sexually transmitted infections (STIs)  Get screened for STIs, including gonorrhea and chlamydia, if: ? You are sexually active and are younger than 64 years of age. ? You are older than 64 years of age and your health care provider tells you that you are at risk for this type of infection. ? Your sexual activity has changed since you were last screened, and you are at increased risk for chlamydia or gonorrhea. Ask your health care provider if   you are at risk.  Ask your health care provider about whether you are at high risk for HIV. Your health care provider may recommend a prescription medicine to help prevent HIV infection. If you choose to take medicine to prevent HIV, you should first get tested for HIV. You should then be tested every 3 months for as long as you are taking the medicine. Pregnancy  If you are about to stop having your period (premenopausal) and  you may become pregnant, seek counseling before you get pregnant.  Take 400 to 800 micrograms (mcg) of folic acid every day if you become pregnant.  Ask for birth control (contraception) if you want to prevent pregnancy. Osteoporosis and menopause Osteoporosis is a disease in which the bones lose minerals and strength with aging. This can result in bone fractures. If you are 65 years old or older, or if you are at risk for osteoporosis and fractures, ask your health care provider if you should:  Be screened for bone loss.  Take a calcium or vitamin D supplement to lower your risk of fractures.  Be given hormone replacement therapy (HRT) to treat symptoms of menopause. Follow these instructions at home: Lifestyle  Do not use any products that contain nicotine or tobacco, such as cigarettes, e-cigarettes, and chewing tobacco. If you need help quitting, ask your health care provider.  Do not use street drugs.  Do not share needles.  Ask your health care provider for help if you need support or information about quitting drugs. Alcohol use  Do not drink alcohol if: ? Your health care provider tells you not to drink. ? You are pregnant, may be pregnant, or are planning to become pregnant.  If you drink alcohol: ? Limit how much you use to 0-1 drink a day. ? Limit intake if you are breastfeeding.  Be aware of how much alcohol is in your drink. In the [place] one drink equals one 12 oz bottle of beer (355 mL), one 5 oz glass of wine (148 mL), or one 1 oz glass of hard liquor (44 mL). General instructions  Schedule regular health, dental, and eye exams.  Stay current with your vaccines.  Tell your health care provider if: ? You often feel depressed. ? You have ever been abused or do not feel safe at home. Summary  Adopting a healthy lifestyle and getting preventive care are important in promoting health and wellness.  Follow your health care provider's instructions about healthy  diet, exercising, and getting tested or screened for diseases.  Follow your health care provider's instructions on monitoring your cholesterol and blood pressure. This information is not intended to replace advice given to you by your health care provider. Make sure you discuss any questions you have with your health care provider. Document Revised: 12/05/2018 Document Reviewed: 12/05/2018 Elsevier Patient Education  2020 [employer]  

## 2020-02-04 ENCOUNTER — Other Ambulatory Visit: Payer: Self-pay

## 2020-02-04 ENCOUNTER — Ambulatory Visit
Admission: RE | Admit: 2020-02-04 | Discharge: 2020-02-04 | Disposition: A | Discharge status: Home / Self Care | Payer: BC Managed Care – PPO | Source: Ambulatory Visit

## 2020-02-04 DIAGNOSIS — Z1231 Encounter for screening mammogram for malignant neoplasm of breast: Secondary | ICD-10-CM | POA: Diagnosis not present

## 2020-02-08 DIAGNOSIS — Z23 Encounter for immunization: Secondary | ICD-10-CM | POA: Diagnosis not present

## 2020-03-07 DIAGNOSIS — Z23 Encounter for immunization: Secondary | ICD-10-CM | POA: Diagnosis not present

## 2020-06-08 ENCOUNTER — Other Ambulatory Visit: Payer: Self-pay

## 2020-06-08 ENCOUNTER — Telehealth (INDEPENDENT_AMBULATORY_CARE_PROVIDER_SITE_OTHER): Payer: BC Managed Care – PPO | Admitting: Family Medicine

## 2020-06-08 ENCOUNTER — Encounter: Payer: Self-pay | Admitting: Family Medicine

## 2020-06-08 DIAGNOSIS — J069 Acute upper respiratory infection, unspecified: Secondary | ICD-10-CM | POA: Diagnosis not present

## 2020-06-08 NOTE — Progress Notes (Signed)
Virtual Visit via Video Note  I connected with pt on 06/08/20 at  4:00 PM EDT by a video enabled telemedicine application and verified that I am speaking with the correct person using two identifiers.  Location patient: home Location provider:work or home office Persons participating in the virtual visit: patient, provider  I discussed the limitations of evaluation and management by telemedicine and the availability of in person appointments. The patient expressed understanding and agreed to proceed.  Telemedicine visit is a necessity given the COVID-19 restrictions in place at the current time.  HPI: 64 y/o AAF being seen today for "sinus congestion and headache".  Started having nasals stuffiness 3-4 days ago, clear mucous, nose stuffy when sleeping during this time.  No thick/green/blood tinged mucous.  Some HA in forehead region but no facial pain or pressure.  No pain in upper teeth. Slight ST initially but this resolved.  WEnt to [place] recently, wore her mask on/off. She has had covid vaccine. No fever, no cough, no SOB. She is really worried that anything like this kind of illness will progress to either a bacterial sinus infection OR pneumonia like she had bacOct 2019.   ROS: See pertinent positives and negatives per HPI.  Past Medical History:  Diagnosis Date  . BPPV (benign paroxysmal positional vertigo) 01/30/2014  . CAP (community acquired pneumonia) 10/2018   with acute hypoxic RF, empyema-->VATS  . Cyst, breast 01/2012; 06/2012; 4/0/98; 12/26/89   Complicated cysts in upper outer left breast--no evidence of malignancy--f/u bilat diag mammo/left breast u/s on 03/06/15 showed resolution of left breast cysts.  Repeat screening mammogram 1 yr.  . Diabetes mellitus without complication (HCC)    Pre-diabetes  . Empyema ([place])    drained by CT surgery/VATS.  Full recovery as of 12/2018 CT surgery f/u visit.  . H/O allergic rhinitis    Dr. Ishmael Holter  . Hepatic cyst 2019   Noted on CT  chest imaging.  Multiple, small, "simple"  . History of pneumonia 2009 and 2010   supplemential oxygen d/c'd 12/16/18 by pulm  . Mild persistent asthma, well controlled 2014   New adult onset asthma after respiratory infection (Dr. Ishmael Holter)  . Morbid obesity ([place])    BMI 50+.  At one point in time she was going to Bariatric clinic on [address] and got weekly HCG injections, monthly vit B12 injections, and took phentermine daily.  As of 05/2015 she was no longer doing this.  . Nephrolithiasis 04/2018 first episode   [facility]ighbours [facility] 1.24mm right UVJ stone + 6 mm L AML.  Pt elected for trial of passage.  . OSA (obstructive sleep apnea) 10/2018   Mild/mod OSA 12/2018--pt not able to afford CPAP as of 01/2019.  . Osteoarthritis of both knees    No improvement with steroid injections; did synvisc x 3 yrs; bilat replacement was recommended but she declined.  . Recurrent low back pain     Past Surgical History:  Procedure Laterality Date  . APPENDECTOMY  1978  . CARDIAC CATHETERIZATION  2010   Normal coronaries per pt  . CARDIOVASCULAR STRESS TEST  2010   abnl per pt; f/u cath clean  . CESAREAN SECTION     x 3  . CHOLECYSTECTOMY  1986  . COLONOSCOPY  2008   normal per pt report (in [place])  . EMPYEMA DRAINAGE Left 10/24/2018   Evacuation of empyema with decortication.  Procedure: EMPYEMA DRAINAGE;  Surgeon: Grace Isaac, MD;  Location: [facility];  Service:  Thoracic;  Laterality: Left;  . LUMBAR DISC SURGERY  1993   L5/S1  . TONSILLECTOMY AND ADENOIDECTOMY  1967ish  . TOTAL ABDOMINAL HYSTERECTOMY  1994   Ovaries are still in.  This was done for DUB/fibroids.  Marland Kitchen VIDEO ASSISTED THORACOSCOPY (VATS)/THOROCOTOMY Left 10/24/2018   Procedure: VIDEO ASSISTED THORACOSCOPY (VATS)/THOROCOTOMY with EVACUATION OF EMPYEMA AND DECORTICATION.;  Surgeon: Grace Isaac, MD;  Location: [facility];  Service: Thoracic;  Laterality: Left;  Marland Kitchen VIDEO BRONCHOSCOPY N/A 10/24/2018   Procedure: VIDEO  BRONCHOSCOPY;  Surgeon: Grace Isaac, MD;  Location: [facility] OR;  Service: Thoracic;  Laterality: N/A;    Family History  Problem Relation Age of Onset  . Arthritis Mother   . Heart disease Mother 45  . Hypertension Mother   . Diabetes Mother   . Arthritis Paternal Grandmother   . Breast cancer Neg Hx       Current Outpatient Medications:  .  APPLE CIDER VINEGAR PO, Take 600 mg by mouth daily., Disp: , Rfl:  .  Ascorbic Acid (VITAMIN C) 1000 MG tablet, Take 1,000 mg by mouth daily., Disp: , Rfl:  .  BAYER MICROLET LANCETS lancets, Use to check blood sugar once daily, Disp: 100 each, Rfl: 11 .  Cholecalciferol (VITAMIN D3) 100000 UNIT/GM POWD, by Does not apply route., Disp: , Rfl:  .  glucose blood (CONTOUR NEXT TEST) test strip, Use to check blood sugar once daily, Disp: 100 each, Rfl: 11 .  metFORMIN (GLUCOPHAGE) 1000 MG tablet, 1 tab po bid with meals, Disp: 180 tablet, Rfl: 3 .  Multiple Vitamin (MULTIVITAMIN) tablet, Take 1 tablet by mouth daily., Disp: , Rfl:  .  Turmeric (QC TUMERIC COMPLEX PO), Take by mouth., Disp: , Rfl:  .  albuterol (PROVENTIL) (2.5 MG/3ML) 0.083% nebulizer solution, Take 3 mLs (2.5 mg total) by nebulization every 6 (six) hours as needed for wheezing. (Patient not taking: Reported on 06/08/2020), Disp: 45 mL, Rfl: 1 .  albuterol (VENTOLIN HFA) 108 (90 Base) MCG/ACT inhaler, Inhale 2 puffs into the lungs every 4 (four) hours as needed for wheezing. (Patient not taking: Reported on 06/08/2020), Disp: 6.7 g, Rfl: 3 .  beclomethasone (QVAR REDIHALER) 40 MCG/ACT inhaler, Inhale 2 puffs into the lungs 2 (two) times daily. (Patient not taking: Reported on 06/08/2020), Disp: 10.6 g, Rfl: 2 .  Biotin (BIOTIN MAXIMUM) 10000 MCG TBDP, Take by mouth. (Patient not taking: Reported on 06/08/2020), Disp: , Rfl:   EXAM:  VITALS per patient if applicable: There were no vitals taken for this visit.   GENERAL: alert, oriented, appears well and in no acute distress  HEENT:  atraumatic, conjunttiva clear, no obvious abnormalities on inspection of external nose and ears  NECK: normal movements of the head and neck  LUNGS: on inspection no signs of respiratory distress, breathing rate appears normal, no obvious gross SOB, gasping or wheezing  CV: no obvious cyanosis  MS: moves all visible extremities without noticeable abnormality  PSYCH/NEURO: pleasant and cooperative, no obvious depression or anxiety, speech and thought processing grossly intact  LABS: none today  Lab Results  Component Value Date   HGBA1C 5.6 11/15/2019     Chemistry      Component Value Date/Time   NA 143 01/31/2020 1003   K 4.2 01/31/2020 1003   CL 107 01/31/2020 1003   CO2 28 01/31/2020 1003   BUN 16 01/31/2020 1003   CREATININE 0.88 01/31/2020 1003      Component Value Date/Time   CALCIUM 9.4 01/31/2020  1003   ALKPHOS 69 01/31/2020 1003   AST 13 01/31/2020 1003   ALT 12 01/31/2020 1003   BILITOT 0.4 01/31/2020 1003      ASSESSMENT AND PLAN:  Discussed the following assessment and plan:  Viral URI. Symptomatic care. Reassured pt that antibiotics in this type of illness/situation is NOT the correct thing to use. I discussed symptomatic care with her and she expressed understanding and agreement with plan. Signs/symptoms to call or return for were reviewed and pt expressed understanding.  -we discussed possible serious and likely etiologies, options for evaluation and workup, limitations of telemedicine visit vs in person visit, treatment, treatment risks and precautions. Pt prefers to treat via telemedicine empirically rather then risking or undertaking an in person visit at this moment. Patient agrees to seek prompt in person care if worsening, new symptoms arise, or if is not improving with treatment.   I discussed the assessment and treatment plan with the patient. The patient was provided an opportunity to ask questions and all were answered. The patient agreed  with the plan and demonstrated an understanding of the instructions.   The patient was advised to call back or seek an in-person evaluation if the symptoms worsen or if the condition fails to improve as anticipated.  F/u: if not improving appropriately in 4-5d or if worsens prior  Signed:  Crissie Sickles, MD           06/08/2020

## 2020-07-09 ENCOUNTER — Other Ambulatory Visit: Payer: Self-pay

## 2020-07-09 ENCOUNTER — Telehealth: Payer: Self-pay

## 2020-07-09 MED ORDER — METFORMIN HCL 1000 MG PO TABS: ORAL_TABLET | ORAL | 0 refills | Status: DC

## 2020-07-09 NOTE — Telephone Encounter (Signed)
30 d supply sent. Patient notified

## 2020-07-09 NOTE — Telephone Encounter (Signed)
Patient refill request. Scheduled appt for 8/6 with Dr. Anitra Lauth  metFORMIN (GLUCOPHAGE) 1000 MG tablet [[id number]]   [facility] [address] - [place], [place] - [address]  [address], [address], [place] [address]  Phone:  [phone] Fax:  [phone]  DEA #:  --

## 2020-07-31 ENCOUNTER — Other Ambulatory Visit: Payer: Self-pay

## 2020-07-31 ENCOUNTER — Encounter: Payer: Self-pay | Admitting: Family Medicine

## 2020-07-31 ENCOUNTER — Telehealth: Payer: Self-pay

## 2020-07-31 ENCOUNTER — Ambulatory Visit (INDEPENDENT_AMBULATORY_CARE_PROVIDER_SITE_OTHER): Payer: BC Managed Care – PPO | Admitting: Family Medicine

## 2020-07-31 VITALS — BP 103/64 | HR 70 | Temp 98.6°F | Ht 67.0 in | Wt 343.2 lb

## 2020-07-31 DIAGNOSIS — R7303 Prediabetes: Secondary | ICD-10-CM

## 2020-07-31 DIAGNOSIS — J4530 Mild persistent asthma, uncomplicated: Secondary | ICD-10-CM | POA: Diagnosis not present

## 2020-07-31 LAB — LIPID PANEL
Cholesterol: 162 mg/dL (ref 0–200)
HDL: 82.1 mg/dL (ref >39.00)
LDL Cholesterol: 62 mg/dL (ref 0–99)
NonHDL: 79.5
Total CHOL/HDL Ratio: 2
Triglycerides: 90 mg/dL (ref 0.0–149.0)
VLDL: 18 mg/dL (ref 0.0–40.0)

## 2020-07-31 LAB — BASIC METABOLIC PANEL
BUN: 11 mg/dL (ref 6–23)
CO2: 29 mEq/L (ref 19–32)
Calcium: 9 mg/dL (ref 8.4–10.5)
Chloride: 109 mEq/L (ref 96–112)
Creatinine, Ser: 0.81 mg/dL (ref 0.40–1.20)
GFR: 86 mL/min (ref >60.00)
Glucose, Bld: 103 mg/dL — ABNORMAL HIGH (ref 70–99)
Potassium: 4.2 mEq/L (ref 3.5–5.1)
Sodium: 143 mEq/L (ref 135–145)

## 2020-07-31 LAB — POCT GLYCOSYLATED HEMOGLOBIN (HGB A1C): Hemoglobin A1C: 6.1 % — AB (ref 4.0–5.6)

## 2020-07-31 MED ORDER — METFORMIN HCL 1000 MG PO TABS: ORAL_TABLET | ORAL | 3 refills | Status: DC

## 2020-07-31 NOTE — Progress Notes (Signed)
OFFICE VISIT  07/31/2020   CC:  Chief Complaint  Patient presents with  . Follow-up    fasting   HPI:    Patient is a 64 y.o. African-American female who presents for 6 mo f/u prediabetes and morb obesity.  Glucoses: fasting gluc this morning 114.  Low 20s usually last few weeks though.  No checks later in the day. Taking care of grandkids daily.  Not really She is interested in wt loss meds, possible non-surg bariatric clinic referral.  She goes to Dr. Jerrol Banana longer seeing Dr. Ishmael Holter. Lungs/asthma doing well, has not used any albut in a LONG TIME. Not taking QVAR  Now either.  Starts this med in fall and also in spring.    Past Medical History:  Diagnosis Date  . BPPV (benign paroxysmal positional vertigo) 01/30/2014  . CAP (community acquired pneumonia) 10/2018   with acute hypoxic RF, empyema-->VATS  . Cyst, breast 01/2012; 06/2012; 01/01/00; 0/9/32   Complicated cysts in upper outer left breast--no evidence of malignancy--f/u bilat diag mammo/left breast u/s on 03/06/15 showed resolution of left breast cysts.  Repeat screening mammogram 1 yr.  . Diabetes mellitus without complication (HCC)    Pre-diabetes  . Empyema ([place])    drained by CT surgery/VATS.  Full recovery as of 12/2018 CT surgery f/u visit.  . H/O allergic rhinitis    Dr. Ishmael Holter  . Hepatic cyst 2019   Noted on CT chest imaging.  Multiple, small, "simple"  . History of pneumonia 2009 and 2010   supplemential oxygen d/c'd 12/16/18 by pulm  . Mild persistent asthma, well controlled 2014   New adult onset asthma after respiratory infection (Dr. Ishmael Holter)  . Morbid obesity ([place])    BMI 50+.  At one point in time she was going to Bariatric clinic on [address] and got weekly HCG injections, monthly vit B12 injections, and took phentermine daily.  As of 05/2015 she was no longer doing this.  . Nephrolithiasis 04/2018 first episode   Dr. Lov[facility] Neighbours ([facility]): 1.40mm right UVJ stone + 6 mm L AML.  Pt elected for trial of  passage.  . OSA (obstructive sleep apnea) 10/2018   Mild/mod OSA 12/2018--pt not able to afford CPAP as of 01/2019.  . Osteoarthritis of both knees    No improvement with steroid injections; did synvisc x 3 yrs; bilat replacement was recommended but she declined.  . Recurrent low back pain     Past Surgical History:  Procedure Laterality Date  . APPENDECTOMY  1978  . CARDIAC CATHETERIZATION  2010   Normal coronaries per pt  . CARDIOVASCULAR STRESS TEST  2010   abnl per pt; f/u cath clean  . CESAREAN SECTION     x 3  . CHOLECYSTECTOMY  1986  . COLONOSCOPY  2008   normal per pt report (in [place])  . EMPYEMA DRAINAGE Left 10/24/2018   Evacuation of empyema with decortication.  Procedure: EMPYEMA DRAINAGE;  Surgeon: Grace Isaac, MD;  Location: [facility];  Service: Thoracic;  Laterality: Left;  . LUMBAR DISC SURGERY  1993   L5/S1  . TONSILLECTOMY AND ADENOIDECTOMY  1967ish  . TOTAL ABDOMINAL HYSTERECTOMY  1994   Ovaries are still in.  This was done for DUB/fibroids.  Marland Kitchen VIDEO ASSISTED THORACOSCOPY (VATS)/THOROCOTOMY Left 10/24/2018   Procedure: VIDEO ASSISTED THORACOSCOPY (VATS)/THOROCOTOMY with EVACUATION OF EMPYEMA AND DECORTICATION.;  Surgeon: Grace Isaac, MD;  Location: [facility];  Service: Thoracic;  Laterality: Left;  Marland Kitchen VIDEO BRONCHOSCOPY N/A 10/24/2018  Procedure: VIDEO BRONCHOSCOPY;  Surgeon: Grace Isaac, MD;  Location: [facility] OR;  Service: Thoracic;  Laterality: N/A;    Outpatient Medications Prior to Visit  Medication Sig Dispense Refill  . albuterol (PROVENTIL) (2.5 MG/3ML) 0.083% nebulizer solution Take 3 mLs (2.5 mg total) by nebulization every 6 (six) hours as needed for wheezing. (Patient not taking: Reported on 06/08/2020) 45 mL 1  . albuterol (VENTOLIN HFA) 108 (90 Base) MCG/ACT inhaler Inhale 2 puffs into the lungs every 4 (four) hours as needed for wheezing. (Patient not taking: Reported on 06/08/2020) 6.7 g 3  . APPLE CIDER VINEGAR PO Take 600 mg by mouth daily.     . Ascorbic Acid (VITAMIN C) 1000 MG tablet Take 1,000 mg by mouth daily.    Marland Kitchen BAYER MICROLET LANCETS lancets Use to check blood sugar once daily 100 each 11  . beclomethasone (QVAR REDIHALER) 40 MCG/ACT inhaler Inhale 2 puffs into the lungs 2 (two) times daily. (Patient not taking: Reported on 06/08/2020) 10.6 g 2  . Biotin (BIOTIN MAXIMUM) 10000 MCG TBDP Take by mouth. (Patient not taking: Reported on 06/08/2020)    . Cholecalciferol (VITAMIN D3) 100000 UNIT/GM POWD by Does not apply route.    Marland Kitchen glucose blood (CONTOUR NEXT TEST) test strip Use to check blood sugar once daily 100 each 11  . metFORMIN (GLUCOPHAGE) 1000 MG tablet 1 tab po bid with meals 60 tablet 0  . Multiple Vitamin (MULTIVITAMIN) tablet Take 1 tablet by mouth daily.    . Turmeric (QC TUMERIC COMPLEX PO) Take by mouth.     No facility-administered medications prior to visit.    Allergies  Allergen Reactions  . Penicillins Nausea Only and Rash    Has patient had a PCN reaction causing immediate rash, facial/tongue/throat swelling, SOB or lightheadedness with hypotension: yES Has patient had a PCN reaction causing severe rash involving mucus membranes or skin necrosis: No Has patient had a PCN reaction that required hospitalization: No Has patient had a PCN reaction occurring within the last 10 years: No If all of the above answers are "NO", then may proceed with Cephalosporin use. CC    ROS As per HPI  PE: Vitals with BMI 07/31/2020 01/31/2020 08/23/2019  Height 5\' 7"  5\' 7"  5\' 7"   Weight 343 lbs 3 oz 338 lbs 331 lbs  BMI 53.74 16.60 63.01  Systolic 601 093 235  Diastolic 64 71 66  Pulse 70 64 69  O2 sat on RA today is 96%  Gen: Alert, well appearing.  Patient is oriented to person, place, time, and situation. AFFECT: pleasant, lucid thought and speech. CV: RRR, no m/r/g.   LUNGS: CTA bilat, nonlabored resps, good aeration in all lung fields. EXT: no clubbing or cyanosis.  no edema.    LABS:  Lab Results   Component Value Date   TSH 1.18 01/31/2020   Lab Results  Component Value Date   WBC 9.0 01/31/2020   HGB 12.8 01/31/2020   HCT 39.8 01/31/2020   MCV 83.7 01/31/2020   PLT 282.0 01/31/2020   Lab Results  Component Value Date   CREATININE 0.88 01/31/2020   BUN 16 01/31/2020   NA 143 01/31/2020   K 4.2 01/31/2020   CL 107 01/31/2020   CO2 28 01/31/2020   Lab Results  Component Value Date   ALT 12 01/31/2020   AST 13 01/31/2020   ALKPHOS 69 01/31/2020   BILITOT 0.4 01/31/2020   Lab Results  Component Value Date   CHOL 178  01/31/2020   Lab Results  Component Value Date   HDL 79.60 01/31/2020   Lab Results  Component Value Date   LDLCALC 75 01/31/2020   Lab Results  Component Value Date   TRIG 116.0 01/31/2020   Lab Results  Component Value Date   CHOLHDL 2 01/31/2020   Lab Results  Component Value Date   HGBA1C 5.6 11/15/2019   POC HbA1c today: 6.1%  IMPRESSION AND PLAN:  1) Prediabetes: compliant with metformin 1000mg  bid. Diet not ideal.  Fairly active but no formal exercise. Hba1c today in office->6.1%. Covid 19 vaccine UTD. Monitor fasting lipids and BMET today.  2) Mild pers asthma: doing great, currently on NO inhalers. She will restart QVAR this fall when she typically starts having sx's.  Has albut rescue on hand. Currently does not have CPAP machine for her OSA, financial constraints. Plans on getting this next year when medicare starts for her.  3) Morbid obesity: needs to step up diet and exercise. She will consider non-surg bariatric clinic referral but wants to wait until next year after medicare starts.  An After Visit Summary was printed and given to the patient.  FOLLOW UP: Return in about 6 months (around 01/31/2021) for annual CPE (fasting).  Signed:  Crissie Sickles, MD           07/31/2020

## 2020-07-31 NOTE — Telephone Encounter (Signed)
Patient was seen this morning by Dr. Anitra Lauth. Prescription was sent to wrong pharmacy. Please correct.  metFORMIN (GLUCOPHAGE) 1000 MG tablet [[id number]]   Correct pharmacy is  [id number] [place], [place] - [address]  Thank you.

## 2020-07-31 NOTE — Telephone Encounter (Signed)
Rx sent to [facility] cancelled and resent Metformin to [facility]. Patient notified.

## 2020-10-21 ENCOUNTER — Other Ambulatory Visit: Payer: Self-pay | Admitting: Family Medicine

## 2020-10-21 DIAGNOSIS — Z1231 Encounter for screening mammogram for malignant neoplasm of breast: Secondary | ICD-10-CM

## 2020-11-10 ENCOUNTER — Ambulatory Visit: Payer: BC Managed Care – PPO | Admitting: Family Medicine

## 2020-11-16 ENCOUNTER — Ambulatory Visit: Payer: BC Managed Care – PPO | Admitting: Family Medicine

## 2020-11-23 ENCOUNTER — Ambulatory Visit: Payer: BC Managed Care – PPO | Admitting: Family Medicine

## 2020-12-07 ENCOUNTER — Encounter: Payer: Self-pay | Admitting: Gastroenterology

## 2020-12-21 ENCOUNTER — Other Ambulatory Visit: Payer: Self-pay

## 2020-12-21 ENCOUNTER — Ambulatory Visit (INDEPENDENT_AMBULATORY_CARE_PROVIDER_SITE_OTHER): Payer: BC Managed Care – PPO

## 2020-12-21 DIAGNOSIS — Z23 Encounter for immunization: Secondary | ICD-10-CM

## 2021-02-01 ENCOUNTER — Ambulatory Visit: Payer: BC Managed Care – PPO | Admitting: Family Medicine

## 2021-02-08 ENCOUNTER — Ambulatory Visit
Admission: RE | Admit: 2021-02-08 | Discharge: 2021-02-08 | Disposition: A | Discharge status: Home / Self Care | Payer: Medicare Other | Source: Ambulatory Visit | Attending: Family Medicine | Admitting: Family Medicine

## 2021-02-08 ENCOUNTER — Other Ambulatory Visit: Payer: Self-pay

## 2021-02-08 ENCOUNTER — Ambulatory Visit (INDEPENDENT_AMBULATORY_CARE_PROVIDER_SITE_OTHER): Payer: Medicare Other | Admitting: Family Medicine

## 2021-02-08 ENCOUNTER — Ambulatory Visit: Payer: BC Managed Care – PPO

## 2021-02-08 ENCOUNTER — Encounter: Payer: Self-pay | Admitting: Family Medicine

## 2021-02-08 VITALS — BP 108/71 | HR 70 | Temp 97.9°F | Resp 16 | Ht 67.0 in | Wt 342.4 lb

## 2021-02-08 DIAGNOSIS — R7303 Prediabetes: Secondary | ICD-10-CM

## 2021-02-08 DIAGNOSIS — Z0000 Encounter for general adult medical examination without abnormal findings: Secondary | ICD-10-CM

## 2021-02-08 DIAGNOSIS — Z1211 Encounter for screening for malignant neoplasm of colon: Secondary | ICD-10-CM | POA: Diagnosis not present

## 2021-02-08 DIAGNOSIS — Z1231 Encounter for screening mammogram for malignant neoplasm of breast: Secondary | ICD-10-CM

## 2021-02-08 DIAGNOSIS — J4530 Mild persistent asthma, uncomplicated: Secondary | ICD-10-CM | POA: Diagnosis not present

## 2021-02-08 LAB — LIPID PANEL
Cholesterol: 180 mg/dL (ref 0–200)
HDL: 83.1 mg/dL (ref >39.00)
LDL Cholesterol: 78 mg/dL (ref 0–99)
NonHDL: 96.79
Total CHOL/HDL Ratio: 2
Triglycerides: 96 mg/dL (ref 0.0–149.0)
VLDL: 19.2 mg/dL (ref 0.0–40.0)

## 2021-02-08 LAB — CBC WITH DIFFERENTIAL/PLATELET
Basophils Absolute: 0.1 10*3/uL (ref 0.0–0.1)
Basophils Relative: 0.8 % (ref 0.0–3.0)
Eosinophils Absolute: 0.2 10*3/uL (ref 0.0–0.7)
Eosinophils Relative: 2.8 % (ref 0.0–5.0)
HCT: 39.2 % (ref 36.0–46.0)
Hemoglobin: 12.7 g/dL (ref 12.0–15.0)
Lymphocytes Relative: 28.2 % (ref 12.0–46.0)
Lymphs Abs: 2.4 10*3/uL (ref 0.7–4.0)
MCHC: 32.5 g/dL (ref 30.0–36.0)
MCV: 83.6 fl (ref 78.0–100.0)
Monocytes Absolute: 0.5 10*3/uL (ref 0.1–1.0)
Monocytes Relative: 6.3 % (ref 3.0–12.0)
Neutro Abs: 5.2 10*3/uL (ref 1.4–7.7)
Neutrophils Relative %: 61.9 % (ref 43.0–77.0)
Platelets: 273 10*3/uL (ref 150.0–400.0)
RBC: 4.68 Mil/uL (ref 3.87–5.11)
RDW: 16 % — ABNORMAL HIGH (ref 11.5–15.5)
WBC: 8.4 10*3/uL (ref 4.0–10.5)

## 2021-02-08 LAB — COMPREHENSIVE METABOLIC PANEL
ALT: 12 U/L (ref 0–35)
AST: 12 U/L (ref 0–37)
Albumin: 3.8 g/dL (ref 3.5–5.2)
Alkaline Phosphatase: 67 U/L (ref 39–117)
BUN: 17 mg/dL (ref 6–23)
CO2: 27 mEq/L (ref 19–32)
Calcium: 9.6 mg/dL (ref 8.4–10.5)
Chloride: 106 mEq/L (ref 96–112)
Creatinine, Ser: 0.85 mg/dL (ref 0.40–1.20)
GFR: 72.1 mL/min (ref >60.00)
Glucose, Bld: 104 mg/dL — ABNORMAL HIGH (ref 70–99)
Potassium: 4.6 mEq/L (ref 3.5–5.1)
Sodium: 142 mEq/L (ref 135–145)
Total Bilirubin: 0.4 mg/dL (ref 0.2–1.2)
Total Protein: 6.7 g/dL (ref 6.0–8.3)

## 2021-02-08 LAB — HEMOGLOBIN A1C: Hgb A1c MFr Bld: 6.1 % (ref 4.6–6.5)

## 2021-02-08 LAB — TSH: TSH: 1.15 u[IU]/mL (ref 0.35–4.50)

## 2021-02-08 MED ORDER — GLUCOSE BLOOD VI STRP: ORAL_STRIP | 12 refills | Status: DC

## 2021-02-08 MED ORDER — ONETOUCH ULTRASOFT LANCETS MISC
12 refills | Status: DC
Start: 2021-02-08 — End: 2022-03-01

## 2021-02-08 MED ORDER — NEBULIZER AIR TUBE/PLUGS MISC: 5 refills | Status: DC

## 2021-02-08 MED ORDER — QVAR REDIHALER 40 MCG/ACT IN AERB: 2.0000 | INHALATION_SPRAY | Freq: Two times a day (BID) | RESPIRATORY_TRACT | 2 refills | Status: DC

## 2021-02-08 MED ORDER — ALBUTEROL SULFATE HFA 108 (90 BASE) MCG/ACT IN AERS: 2.0000 | INHALATION_SPRAY | RESPIRATORY_TRACT | 3 refills | Status: DC | PRN

## 2021-02-08 MED ORDER — ALBUTEROL SULFATE (2.5 MG/3ML) 0.083% IN NEBU: 2.5000 mg | INHALATION_SOLUTION | Freq: Four times a day (QID) | RESPIRATORY_TRACT | 1 refills | Status: DC | PRN

## 2021-02-08 NOTE — Addendum Note (Signed)
Addended by: Deveron Furlong D on: 02/08/2021 10:58 AM   Modules accepted: Orders

## 2021-02-08 NOTE — Progress Notes (Signed)
Office Note 02/08/2021  CC:  Chief Complaint  Patient presents with  . Follow-up    RCI, pt is fasting    HPI:  Taylor Leon is a 65 y.o. Black female who is here for annual health maintenance exam and 6 mo f/u borderline DM 2 and mild persistent asthma. A/P as of last visit: "1) Prediabetes: compliant with metformin 1000mg  bid. Diet not ideal.  Fairly active but no formal exercise. Hba1c today in office->6.1%. Covid 19 vaccine UTD. Monitor fasting lipids and BMET today.  2) Mild pers asthma: doing great, currently on NO inhalers. She will restart QVAR this fall when she typically starts having sx's.  Has albut rescue on hand. Currently does not have CPAP machine for her OSA, financial constraints. Plans on getting this next year when medicare starts for her.  3) Morbid obesity: needs to step up diet and exercise. She will consider non-surg bariatric clinic referral but wants to wait until next year after medicare starts."  INTERIM HX:  Fasting gluc's 110-120. No polydipsia, polyuria, or polyphagia.  Walking in supermarket aisles is her exercise, babysits her 81mo old grandchild frequently. She hydrates very well.    No wheezing at all, has not been taking QVAR or needing any rescue med. Asks for RFs.   Past Medical History:  Diagnosis Date  . BPPV (benign paroxysmal positional vertigo) 01/30/2014  . CAP (community acquired pneumonia) 10/2018   with acute hypoxic RF, empyema-->VATS  . Cyst, breast 01/2012; 06/2012; 01/30/08; 06/27/52   Complicated cysts in upper outer left breast--no evidence of malignancy--f/u bilat diag mammo/left breast u/s on 03/06/15 showed resolution of left breast cysts.  Repeat screening mammogram 1 yr.  . Diabetes mellitus without complication (HCC)    Pre-diabetes  . Empyema ([place])    drained by CT surgery/VATS.  Full recovery as of 12/2018 CT surgery f/u visit.  . H/O allergic rhinitis    Dr. Ishmael Holter  . Hepatic cyst 2019   Noted on CT chest  imaging.  Multiple, small, "simple"  . History of pneumonia 2009 and 2010   supplemential oxygen d/c'd 12/16/18 by pulm  . Mild persistent asthma, well controlled 2014   New adult onset asthma after respiratory infection (Dr. Ishmael Holter)  . Morbid obesity ([place])    BMI 50+.  At one point in time she was going to Bariatric clinic on [address] and got weekly HCG injections, monthly vit B12 injections, and took phentermine daily.  As of 05/2015 she was no longer doing this.  . Nephrolithiasis 04/2018 first episode   Dr. Lovena Neighbours ([facility]): 1.54mm right UVJ stone + 6 mm L AML.  Pt elected for trial of passage.  . OSA (obstructive sleep apnea) 10/2018   Mild/mod OSA 12/2018--pt not able to afford CPAP as of 01/2019.  . Osteoarthritis of both knees    No improvement with steroid injections; did synvisc x 3 yrs; bilat replacement was recommended but she declined.  . Recurrent low back pain     Past Surgical History:  Procedure Laterality Date  . APPENDECTOMY  1978  . CARDIAC CATHETERIZATION  2010   Normal coronaries per pt  . CARDIOVASCULAR STRESS TEST  2010   abnl per pt; f/u cath clean  . CESAREAN SECTION     x 3  . CHOLECYSTECTOMY  1986  . COLONOSCOPY  2008   normal per pt report (in [place])  . EMPYEMA DRAINAGE Left 10/24/2018   Evacuation of empyema with decortication.  Procedure: EMPYEMA DRAINAGE;  Surgeon:  Grace Isaac, MD;  Location: [facility];  Service: Thoracic;  Laterality: Left;  . LUMBAR DISC SURGERY  1993   L5/S1  . TONSILLECTOMY AND ADENOIDECTOMY  1967ish  . TOTAL ABDOMINAL HYSTERECTOMY  1994   Ovaries are still in.  This was done for DUB/fibroids.  Marland Kitchen VIDEO ASSISTED THORACOSCOPY (VATS)/THOROCOTOMY Left 10/24/2018   Procedure: VIDEO ASSISTED THORACOSCOPY (VATS)/THOROCOTOMY with EVACUATION OF EMPYEMA AND DECORTICATION.;  Surgeon: Grace Isaac, MD;  Location: [facility];  Service: Thoracic;  Laterality: Left;  Marland Kitchen VIDEO BRONCHOSCOPY N/A 10/24/2018   Procedure: VIDEO BRONCHOSCOPY;   Surgeon: Grace Isaac, MD;  Location: [facility] OR;  Service: Thoracic;  Laterality: N/A;    Family History  Problem Relation Age of Onset  . Arthritis Mother   . Heart disease Mother 45  . Hypertension Mother   . Diabetes Mother   . Colon cancer Mother        84's  . Arthritis Paternal Grandmother   . Breast cancer Neg Hx     Social History   Socioeconomic History  . Marital status: Married    Spouse name: Not on file  . Number of children: 4  . Years of education: Not on file  . Highest education level: Not on file  Occupational History  . Not on file  Tobacco Use  . Smoking status: Former Smoker    Packs/day: 0.75    Years: 34.00    Pack years: 25.50    Types: Cigarettes    Start date: 08/1976    Quit date: 12/28/2009    Years since quitting: 11.1  . Smokeless tobacco: Never Used  Vaping Use  . Vaping Use: Never used  Substance and Sexual Activity  . Alcohol use: Yes    Comment: social  . Drug use: No  . Sexual activity: Not on file  Other Topics Concern  . Not on file  Social History Narrative   Married, 4 grown children.   Relocated to [place], [place] from [place] about 2012.   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   Exercise: intermittently does water aerobics..            Social Determinants of Health   Financial Resource Strain: Not on file  Food Insecurity: Not on file  Transportation Needs: Not on file  Physical Activity: Not on file  Stress: Not on file  Social Connections: Not on file  Intimate Partner Violence: Not on file    Outpatient Medications Prior to Visit  Medication Sig Dispense Refill  . APPLE CIDER VINEGAR PO Take 600 mg by mouth daily.    . Ascorbic Acid (VITAMIN C) 1000 MG tablet Take 1,000 mg by mouth daily.    Marland Kitchen BAYER MICROLET LANCETS lancets Use to check blood sugar once daily 100 each 11  . Cholecalciferol (VITAMIN D3) 100000 UNIT/GM POWD by Does not  apply route.    Marland Kitchen glucose blood (CONTOUR NEXT TEST) test strip Use to check blood sugar once daily 100 each 11  . metFORMIN (GLUCOPHAGE) 1000 MG tablet 1 tab po bid with meals 180 tablet 3  . Multiple Vitamin (MULTIVITAMIN) tablet Take 1 tablet by mouth daily.    . Turmeric (QC TUMERIC COMPLEX PO) Take by mouth.    Marland Kitchen albuterol (VENTOLIN HFA) 108 (90 Base) MCG/ACT inhaler Inhale 2 puffs into the lungs every 4 (four) hours as needed for wheezing. 6.7 g 3  . albuterol (PROVENTIL) (  2.5 MG/3ML) 0.083% nebulizer solution Take 3 mLs (2.5 mg total) by nebulization every 6 (six) hours as needed for wheezing. (Patient not taking: No sig reported) 45 mL 1  . beclomethasone (QVAR REDIHALER) 40 MCG/ACT inhaler Inhale 2 puffs into the lungs 2 (two) times daily. (Patient not taking: No sig reported) 10.6 g 2  . Biotin (BIOTIN MAXIMUM) 10000 MCG TBDP Take by mouth. (Patient not taking: No sig reported)     No facility-administered medications prior to visit.    Allergies  Allergen Reactions  . Penicillins Nausea Only and Rash    Has patient had a PCN reaction causing immediate rash, facial/tongue/throat swelling, SOB or lightheadedness with hypotension: yES Has patient had a PCN reaction causing severe rash involving mucus membranes or skin necrosis: No Has patient had a PCN reaction that required hospitalization: No Has patient had a PCN reaction occurring within the last 10 years: No If all of the above answers are "NO", then may proceed with Cephalosporin use. CC    ROS Review of Systems  Constitutional: Negative for appetite change, chills, fatigue and fever.  HENT: Negative for congestion, dental problem, ear pain and sore throat.   Eyes: Negative for discharge, redness and visual disturbance.  Respiratory: Negative for cough, chest tightness, shortness of breath and wheezing.   Cardiovascular: Negative for chest pain, palpitations and leg swelling.  Gastrointestinal: Negative for abdominal pain,  blood in stool, diarrhea, nausea and vomiting.  Genitourinary: Negative for difficulty urinating, dysuria, flank pain, frequency, hematuria and urgency.  Musculoskeletal: Positive for arthralgias (chronic bilat knee pain d/t end stage djd). Negative for back pain, joint swelling, myalgias and neck stiffness.  Skin: Negative for pallor and rash.  Neurological: Negative for dizziness, speech difficulty, weakness and headaches.  Hematological: Negative for adenopathy. Does not bruise/bleed easily.  Psychiatric/Behavioral: Negative for confusion and sleep disturbance. The patient is not nervous/anxious.    PE; Vitals with BMI 02/08/2021 07/31/2020 01/31/2020  Height 5\' 7"  5\' 7"  5\' 7"   Weight 342 lbs 6 oz 343 lbs 3 oz 338 lbs  BMI 53.61 73.71 06.26  Systolic 948 546 270  Diastolic 71 64 71  Pulse 70 70 64   Exam chaperoned by Deveron Furlong, CMA. Gen: Alert, well appearing.  Patient is oriented to person, place, time, and situation. AFFECT: pleasant, lucid thought and speech. ENT: Ears: EACs clear, normal epithelium.  TMs with good light reflex and landmarks bilaterally.  Eyes: no injection, icteris, swelling, or exudate.  EOMI, PERRLA. Nose: no drainage or turbinate edema/swelling.  No injection or focal lesion.  Mouth: lips without lesion/swelling.  Oral mucosa pink and moist.  Dentition intact and without obvious caries or gingival swelling.  Oropharynx without erythema, exudate, or swelling.  Neck: supple/nontender.  No LAD, mass, or TM.  Carotid pulses 2+ bilaterally, without bruits. CV: RRR, no m/r/g.   LUNGS: CTA bilat, nonlabored resps, good aeration in all lung fields. ABD: soft, NT, ND, BS normal.  No hepatospenomegaly or mass.  No bruits. EXT: no clubbing, cyanosis, or edema.  Musculoskeletal: no joint swelling, erythema, warmth, or tenderness.  ROM of all joints intact. Skin - no sores or suspicious lesions or rashes or color changes Foot exam - no swelling, tenderness or skin or  vascular lesions. Color and temperature is normal. Sensation is intact. Peripheral pulses are palpable. Toenails are normal.   Pertinent labs:  Lab Results  Component Value Date   TSH 1.18 01/31/2020   Lab Results  Component Value Date  WBC 9.0 01/31/2020   HGB 12.8 01/31/2020   HCT 39.8 01/31/2020   MCV 83.7 01/31/2020   PLT 282.0 01/31/2020   Lab Results  Component Value Date   CREATININE 0.81 07/31/2020   BUN 11 07/31/2020   NA 143 07/31/2020   K 4.2 07/31/2020   CL 109 07/31/2020   CO2 29 07/31/2020   Lab Results  Component Value Date   ALT 12 01/31/2020   AST 13 01/31/2020   ALKPHOS 69 01/31/2020   BILITOT 0.4 01/31/2020   Lab Results  Component Value Date   CHOL 162 07/31/2020   Lab Results  Component Value Date   HDL 82.10 07/31/2020   Lab Results  Component Value Date   LDLCALC 62 07/31/2020   Lab Results  Component Value Date   TRIG 90.0 07/31/2020   Lab Results  Component Value Date   CHOLHDL 2 07/31/2020   Lab Results  Component Value Date   HGBA1C 6.1 (A) 07/31/2020   ASSESSMENT AND PLAN:   1) Borderline DM: compliant with metformin 1000 mg bid. Home glucoses near normal. Hba1c, lytes/cr today. Feet exam normal today.  2) Mild persistent asthma: quiescent lately but pt usually starts having sx's in spring. RF of QVAR eRx'd today and will send rx for albut neb sol'n as well as rescue MDI.  3) Health maintenance exam: Reviewed age and gender appropriate health maintenance issues (prudent diet, regular exercise, health risks of tobacco and excessive alcohol, use of seatbelts, fire alarms in home, use of sunscreen).  Also reviewed age and gender appropriate health screening as well as vaccine recommendations. Vaccines: ALL utd. Labs:  Fasting HP labs + Hba1c ordered. Cervical ca screening:  She is s/p hysterectomy in 1994 for DUB, no malignancy.  No further paps. Breast ca screening: mammogram scheduled for today (02/08/21). Colon ca  screening:  +FH CC, also [place] rectal [place].  Refer to GI today.  An After Visit Summary was printed and given to the patient.  FOLLOW UP:  Return in about 6 months (around 08/08/2021) for routine chronic illness f/u.  Signed:  Crissie Sickles, MD           02/08/2021

## 2021-02-08 NOTE — Patient Instructions (Signed)
Buy otc magnesium oxide 500mg  tabs: take 1-2 tabs every night to try to decrease muscle cramps. Also, can try tonic water 3 oz every evening to decrease muscle cramps.  Health Maintenance, Female Adopting a healthy lifestyle and getting preventive care are important in promoting health and wellness. Ask your health care provider about:  The right schedule for you to have regular tests and exams.  Things you can do on your own to prevent diseases and keep yourself healthy. What should I know about diet, weight, and exercise? Eat a healthy diet  Eat a diet that includes plenty of vegetables, fruits, low-fat dairy products, and lean protein.  Do not eat a lot of foods that are high in solid fats, added sugars, or sodium.   Maintain a healthy weight Body mass index (BMI) is used to identify weight problems. It estimates body fat based on height and weight. Your health care provider can help determine your BMI and help you achieve or maintain a healthy weight. Get regular exercise Get regular exercise. This is one of the most important things you can do for your health. Most adults should:  Exercise for at least 150 minutes each week. The exercise should increase your heart rate and make you sweat (moderate-intensity exercise).  Do strengthening exercises at least twice a week. This is in addition to the moderate-intensity exercise.  Spend less time sitting. Even light physical activity can be beneficial. Watch cholesterol and blood lipids Have your blood tested for lipids and cholesterol at 65 years of age, then have this test every 5 years. Have your cholesterol levels checked more often if:  Your lipid or cholesterol levels are high.  You are older than 65 years of age.  You are at high risk for heart disease. What should I know about cancer screening? Depending on your health history and family history, you may need to have cancer screening at various ages. This may include  screening for:  Breast cancer.  Cervical cancer.  Colorectal cancer.  Skin cancer.  Lung cancer. What should I know about heart disease, diabetes, and high blood pressure? Blood pressure and heart disease  High blood pressure causes heart disease and increases the risk of stroke. This is more likely to develop in people who have high blood pressure readings, are of African descent, or are overweight.  Have your blood pressure checked: ? Every 3-5 years if you are 69-26 years of age. ? Every year if you are 57 years old or older. Diabetes Have regular diabetes screenings. This checks your fasting blood sugar level. Have the screening done:  Once every three years after age 81 if you are at a normal weight and have a low risk for diabetes.  More often and at a younger age if you are overweight or have a high risk for diabetes. What should I know about preventing infection? Hepatitis B If you have a higher risk for hepatitis B, you should be screened for this virus. Talk with your health care provider to find out if you are at risk for hepatitis B infection. Hepatitis C Testing is recommended for:  Everyone born from [birth year] through 1965.  Anyone with known risk factors for hepatitis C. Sexually transmitted infections (STIs)  Get screened for STIs, including gonorrhea and chlamydia, if: ? You are sexually active and are younger than 65 years of age. ? You are older than 65 years of age and your health care provider tells you that you are at risk  for this type of infection. ? Your sexual activity has changed since you were last screened, and you are at increased risk for chlamydia or gonorrhea. Ask your health care provider if you are at risk.  Ask your health care provider about whether you are at high risk for HIV. Your health care provider may recommend a prescription medicine to help prevent HIV infection. If you choose to take medicine to prevent HIV, you should first get  tested for HIV. You should then be tested every 3 months for as long as you are taking the medicine. Pregnancy  If you are about to stop having your period (premenopausal) and you may become pregnant, seek counseling before you get pregnant.  Take 400 to 800 micrograms (mcg) of folic acid every day if you become pregnant.  Ask for birth control (contraception) if you want to prevent pregnancy. Osteoporosis and menopause Osteoporosis is a disease in which the bones lose minerals and strength with aging. This can result in bone fractures. If you are 13 years old or older, or if you are at risk for osteoporosis and fractures, ask your health care provider if you should:  Be screened for bone loss.  Take a calcium or vitamin D supplement to lower your risk of fractures.  Be given hormone replacement therapy (HRT) to treat symptoms of menopause. Follow these instructions at home: Lifestyle  Do not use any products that contain nicotine or tobacco, such as cigarettes, e-cigarettes, and chewing tobacco. If you need help quitting, ask your health care provider.  Do not use street drugs.  Do not share needles.  Ask your health care provider for help if you need support or information about quitting drugs. Alcohol use  Do not drink alcohol if: ? Your health care provider tells you not to drink. ? You are pregnant, may be pregnant, or are planning to become pregnant.  If you drink alcohol: ? Limit how much you use to 0-1 drink a day. ? Limit intake if you are breastfeeding.  Be aware of how much alcohol is in your drink. In the [place] one drink equals one 12 oz bottle of beer (355 mL), one 5 oz glass of wine (148 mL), or one 1 oz glass of hard liquor (44 mL). General instructions  Schedule regular health, dental, and eye exams.  Stay current with your vaccines.  Tell your health care provider if: ? You often feel depressed. ? You have ever been abused or do not feel safe at  home. Summary  Adopting a healthy lifestyle and getting preventive care are important in promoting health and wellness.  Follow your health care provider's instructions about healthy diet, exercising, and getting tested or screened for diseases.  Follow your health care provider's instructions on monitoring your cholesterol and blood pressure. This information is not intended to replace advice given to you by your health care provider. Make sure you discuss any questions you have with your health care provider. Document Revised: 12/05/2018 Document Reviewed: 12/05/2018 Elsevier Patient Education  2021 [employer].

## 2021-02-12 DIAGNOSIS — M170 Bilateral primary osteoarthritis of knee: Secondary | ICD-10-CM | POA: Diagnosis not present

## 2021-02-15 ENCOUNTER — Other Ambulatory Visit: Payer: Self-pay

## 2021-02-15 ENCOUNTER — Telehealth: Payer: Self-pay | Admitting: Family Medicine

## 2021-02-15 MED ORDER — METFORMIN HCL 1000 MG PO TABS: ORAL_TABLET | ORAL | 1 refills | Status: DC

## 2021-02-15 NOTE — Telephone Encounter (Signed)
Patient was made aware of refill.

## 2021-02-15 NOTE — Telephone Encounter (Signed)
Patient would like Met[facility] to [facility] on [address], [place]. [facility] states [facility] that [facility] has to do it and [facility] to [facility].  Patient is frustrated. [facility].

## 2021-02-15 NOTE — Telephone Encounter (Signed)
[facility] to [facility] for 90 day supply with 1 refill.

## 2021-03-03 DIAGNOSIS — M179 Osteoarthritis of knee, unspecified: Secondary | ICD-10-CM | POA: Diagnosis not present

## 2021-03-03 DIAGNOSIS — M170 Bilateral primary osteoarthritis of knee: Secondary | ICD-10-CM | POA: Diagnosis not present

## 2021-03-05 ENCOUNTER — Encounter: Payer: Self-pay | Admitting: Gastroenterology

## 2021-03-10 DIAGNOSIS — M170 Bilateral primary osteoarthritis of knee: Secondary | ICD-10-CM | POA: Diagnosis not present

## 2021-03-18 DIAGNOSIS — M170 Bilateral primary osteoarthritis of knee: Secondary | ICD-10-CM | POA: Diagnosis not present

## 2021-04-05 ENCOUNTER — Other Ambulatory Visit: Payer: Self-pay | Admitting: Gastroenterology

## 2021-04-21 ENCOUNTER — Encounter: Payer: Self-pay | Admitting: Family Medicine

## 2021-04-23 ENCOUNTER — Telehealth: Payer: Self-pay | Admitting: Pulmonary Disease

## 2021-04-23 NOTE — Telephone Encounter (Signed)
Please ask her to see APP for re-eval & we can set up HST

## 2021-04-23 NOTE — Telephone Encounter (Signed)
Spoke with pt and scheduled for OV on May 4th with Dr. Elsworth Soho. Pt stated understanding. Nothing further needed at this time. Routing to Dr. Elsworth Soho as Juluis Rainier.

## 2021-04-23 NOTE — Telephone Encounter (Signed)
Called and spoke with patient. She stated that she is now ready for a cpap machine. Her last sleep study was a split night that was done back in 2019. She was interested in the cpap back in 2020 but did not have insurance. She now has Medicare.   She wanted to know if she needed to have the SS completed again. I did advised that since it had been over 1 year, the DME company would refuse the order and a new SS would need to be done. She is interested in completed a home sleep study this time.   I advised her that the first step would be to have an OV with RA so we can get documentation for a need for a SS so her insurance will pay. She stated that Mondays are the best days for her as she has to watch her granddaughter during the rest of the week.   RA, you do not have any openings on 5/16 except for a held spot at 12. She only wants to see you. Would you mind seeing her then?

## 2021-04-28 ENCOUNTER — Encounter: Payer: Self-pay | Admitting: Pulmonary Disease

## 2021-04-28 ENCOUNTER — Ambulatory Visit: Payer: Medicare Other | Admitting: Pulmonary Disease

## 2021-04-28 ENCOUNTER — Other Ambulatory Visit: Payer: Self-pay

## 2021-04-28 VITALS — BP 124/74 | HR 60 | Temp 97.3°F | Ht 67.0 in | Wt 337.8 lb

## 2021-04-28 DIAGNOSIS — G4733 Obstructive sleep apnea (adult) (pediatric): Secondary | ICD-10-CM

## 2021-04-28 DIAGNOSIS — J4530 Mild persistent asthma, uncomplicated: Secondary | ICD-10-CM | POA: Diagnosis not present

## 2021-04-28 NOTE — Assessment & Plan Note (Signed)
Our current strategy of using Qvar during spring and fall has worked for her and she will continue on this. Other times she will only use albuterol on as-needed basis. She will continue on Allegra during allergy season

## 2021-04-28 NOTE — Assessment & Plan Note (Addendum)
We discussed implications of moderate OSA.  She has mild hypersomnolence, snoring and non refreshing sleep. She is resistant to using a full facemask but agreeable to trying CPAP. We will start her off with a nasal cradle mask with with auto CPAP settings.  She will need a home sleep test prior to reassess degree of sleep disordered breathing. Weight loss was again encouraged

## 2021-04-28 NOTE — Progress Notes (Signed)
   Subjective:    Patient ID: Taylor Leon, female    DOB: 1956/11/02, 65 y.o.   MRN: [id number]  HPI   65 yo morbidly obese womanfor FU of OSA & chronic cough  She reports a chronic cough and intermittent wheezing since she moved from [place] to [place] in 2012. She underwent ENT evaluation which was negative. She saw Dr. Ishmael Holter allergist and was diagnosed with "allergy induced asthma" and started on albuterol and Qvar inhaler which she self discontinued. Last flareup was in 08/2018.  She currently uses Qvar during spring and fall.  Shewas admitted 10/19/2018 for pneumonia and parapneumonic effusion and underwent VATS thoracotomy  Last seen 07/2019 Asthma appears to be well controlled, she is back to using Qvar only during spring and fall, hardly uses albuterol inhaler She stated that she is now ready for a cpap machine. Her last sleep study was a split night that was done back in 2019. She was interested in the cpap back in 2020 but did not have insurance. She now has Medicare.  We reviewed sleep study from 2019. Bedtime is 11 PM, sleep latency is minimal, husband has noted snoring, has been using CPAP, wakes up around 6 AM, and wanted nocturnal awakenings, feels refreshed in the mornings, with mild dryness of mouth denies headaches.  She has gained 9 pounds since her sleep study. Epworth sleepiness score is 4  Significant tests/ events reviewed  12/2019NPSG- Wt 328 lbs -mod OSA -RDI 21/h, AHI 9/h  PFTs 01/2019 nml  Review of Systems neg for any significant sore throat, dysphagia, itching, sneezing, nasal congestion or excess/ purulent secretions, fever, chills, sweats, unintended wt loss, pleuritic or exertional cp, hempoptysis, orthopnea pnd or change in chronic leg swelling. Also denies presyncope, palpitations, heartburn, abdominal pain, nausea, vomiting, diarrhea or change in bowel or urinary habits, dysuria,hematuria, rash, arthralgias, visual complaints, headache,  numbness weakness or ataxia.     Objective:   Physical Exam   Gen. Pleasant, obese, in no distress ENT - no lesions, no post nasal drip Neck: No JVD, no thyromegaly, no carotid bruits Lungs: no use of accessory muscles, no dullness to percussion, decreased without rales or rhonchi  Cardiovascular: Rhythm regular, heart sounds  normal, no murmurs or gallops, no peripheral edema Musculoskeletal: No deformities, no cyanosis or clubbing , no tremors        Assessment & Plan:

## 2021-04-28 NOTE — Patient Instructions (Signed)
Schedule HST Based on this, we will set you up with CPPA nasal mask as needed

## 2021-05-11 ENCOUNTER — Ambulatory Visit: Payer: Medicare Other

## 2021-05-11 ENCOUNTER — Other Ambulatory Visit: Payer: Self-pay

## 2021-05-11 DIAGNOSIS — G4733 Obstructive sleep apnea (adult) (pediatric): Secondary | ICD-10-CM

## 2021-05-13 ENCOUNTER — Telehealth: Payer: Self-pay | Admitting: Pulmonary Disease

## 2021-05-13 DIAGNOSIS — G4733 Obstructive sleep apnea (adult) (pediatric): Secondary | ICD-10-CM | POA: Diagnosis not present

## 2021-05-13 NOTE — Telephone Encounter (Signed)
HST showed mild  OSA with AHI 14/ hr Suggest autoCPAP  5-15 cm, nasal mask of choice OV with APP in 6 wks after starting

## 2021-05-14 NOTE — Telephone Encounter (Signed)
Called and went over HST results per Dr Elsworth Soho with patient. All questions answered and patient expressed full understanding of results and agreeable with Dr Bari Mantis recommendations for Cpap. Order placed per Dr Elsworth Soho. Patient expressed full understanding to call and schedule office visit with NP 6 weeks after starting CPAP. Nothing further needed at this time.

## 2021-05-18 ENCOUNTER — Telehealth: Payer: Self-pay | Admitting: Pulmonary Disease

## 2021-05-18 NOTE — Telephone Encounter (Signed)
I received an email message from our qualifying team that we are in need of the following to process this order:   Documentation needed: We need an AQS added in the 04/28/21 f90f notes. I looked in the EMR but couldn't find anything that included one of the following that we can use:  Excessive daytime sleepiness (ESS 10 or over), impaired cognition (memory issues), mood disorders (depression, ADD, ADHD, anxiety, PTSD, etc.), insomnia, hypertension, ischemic heart disease or history of stroke, hypersomnia, pulmonary HTN, or daytime hypersomnolence.   Thank you,   Demetrius Charity

## 2021-05-18 NOTE — Telephone Encounter (Signed)
Dr. Elsworth Soho please advise if any of the below diagnoses can be added to pt note. Thanks!

## 2021-05-18 NOTE — Telephone Encounter (Signed)
Routing to PCCs.

## 2021-05-18 NOTE — Telephone Encounter (Signed)
done

## 2021-05-19 NOTE — Telephone Encounter (Signed)
Message sent to [facility]

## 2021-05-28 DIAGNOSIS — E119 Type 2 diabetes mellitus without complications: Secondary | ICD-10-CM | POA: Diagnosis not present

## 2021-05-28 LAB — HM DIABETES EYE EXAM

## 2021-06-03 DIAGNOSIS — Z0100 Encounter for examination of eyes and vision without abnormal findings: Secondary | ICD-10-CM | POA: Diagnosis not present

## 2021-06-09 ENCOUNTER — Other Ambulatory Visit: Payer: Self-pay

## 2021-06-10 ENCOUNTER — Other Ambulatory Visit: Payer: Self-pay

## 2021-06-10 ENCOUNTER — Ambulatory Visit (INDEPENDENT_AMBULATORY_CARE_PROVIDER_SITE_OTHER): Payer: Medicare Other | Admitting: Family Medicine

## 2021-06-10 ENCOUNTER — Encounter: Payer: Self-pay | Admitting: Family Medicine

## 2021-06-10 VITALS — BP 100/57 | HR 67 | Temp 98.3°F | Resp 16 | Ht 67.0 in | Wt 336.8 lb

## 2021-06-10 DIAGNOSIS — Z23 Encounter for immunization: Secondary | ICD-10-CM

## 2021-06-10 DIAGNOSIS — R7303 Prediabetes: Secondary | ICD-10-CM

## 2021-06-10 DIAGNOSIS — Z87891 Personal history of nicotine dependence: Secondary | ICD-10-CM | POA: Diagnosis not present

## 2021-06-10 DIAGNOSIS — E2839 Other primary ovarian failure: Secondary | ICD-10-CM | POA: Diagnosis not present

## 2021-06-10 DIAGNOSIS — Z0000 Encounter for general adult medical examination without abnormal findings: Secondary | ICD-10-CM | POA: Diagnosis not present

## 2021-06-10 LAB — POCT GLYCOSYLATED HEMOGLOBIN (HGB A1C)
HbA1c POC (<> result, manual entry): 5.7 % (ref 4.0–5.6)
HbA1c, POC (controlled diabetic range): 5.7 % (ref 0.0–7.0)
HbA1c, POC (prediabetic range): 5.7 % (ref 5.7–6.4)
Hemoglobin A1C: 5.7 % — AB (ref 4.0–5.6)

## 2021-06-10 NOTE — Progress Notes (Signed)
WELCOME TO MEDICARE (IPPE) VISIT I explained that today's visit was for the purpose of health promotion and disease detection, as well as an introduction to Medicare and it's covered benefits.  I explained that no labs or other services would be performed today, but if any were determined to be necessary then appropriate orders/referrals would be arranged for these to be done at a future date.  Patient is a 65 y/o AAF who is already an established patient with me.  Has prediabetes and takes metformin 1000 mg bid.  Home fasting glucoses 95-110 range.  Past Medical History:  Diagnosis Date   BPPV (benign paroxysmal positional vertigo) 01/30/2014   CAP (community acquired pneumonia) 10/2018   with acute hypoxic RF, empyema-->VATS   Cyst, breast 01/2012; 06/2012; 12/31/08; 09/01/03   Complicated cysts in upper outer left breast--no evidence of malignancy--f/u bilat diag mammo/left breast u/s on 03/06/15 showed resolution of left breast cysts.  Repeat screening mammogram 1 yr.   Empyema ([place])    drained by CT surgery/VATS.  Full recovery as of 12/2018 CT surgery f/u visit.   H/O allergic rhinitis    Dr. Ishmael Holter   Hepatic cyst 2019   Noted on CT chest imaging.  Multiple, small, "simple"   History of pneumonia 2009 and 2010   supplemential oxygen d/c'd 12/16/18 by pulm   Mild persistent asthma, well controlled 2014   New adult onset asthma after respiratory infection (Dr. Ishmael Holter)   Morbid obesity ([place])    BMI 50+.  At one point in time she was going to Bariatric clinic on [address] and got weekly HCG injections, monthly vit B12 injections, and took phentermine daily.  As of 05/2015 she was no longer doing this.   Nephrolithiasis 04/2018 first episode   Dr. Lovena Neighbours ([facility]): 1.23mm right UVJ stone + 6 mm L AML.  Pt elected for trial of passage.   OSA (obstructive sleep apnea) 10/2018   Mild/mod OSA 12/2018--pt not able to afford CPAP as of 01/2019.   Osteoarthritis of both knees    No improvement  with steroid injections; did synvisc x 3 yrs; bilat replacement was recommended but she declined.   Prediabetes    she's on metformin   Recurrent low back pain    Past Surgical History:  Procedure Laterality Date   APPENDECTOMY  1978   CARDIAC CATHETERIZATION  2010   Normal coronaries per pt   CARDIOVASCULAR STRESS TEST  2010   abnl per pt; f/u cath clean   CESAREAN SECTION     x 3   CHOLECYSTECTOMY  1986   COLONOSCOPY  2011   lipoma and small diminutive polyp in rectum (in [place])-->[facility] to do repeat as of 04/05/21   EMPYEMA DRAINAGE Left 10/24/2018   Evacuation of empyema with decortication.  Procedure: EMPYEMA DRAINAGE;  Surgeon: Grace Isaac, MD;  Location: [place];  Service: Thoracic;  Laterality: Left;   LUMBAR DISC SURGERY  1993   L5/S1   TONSILLECTOMY AND ADENOIDECTOMY  1967ish   TOTAL ABDOMINAL HYSTERECTOMY  1994   Ovaries are still in.  This was done for DUB/fibroids.   VIDEO ASSISTED THORACOSCOPY (VATS)/THOROCOTOMY Left 10/24/2018   Procedure: VIDEO ASSISTED THORACOSCOPY (VATS)/THOROCOTOMY with EVACUATION OF EMPYEMA AND DECORTICATION.;  Surgeon: Grace Isaac, MD;  Location: [place];  Service: Thoracic;  Laterality: Left;   VIDEO BRONCHOSCOPY N/A 10/24/2018   Procedure: VIDEO BRONCHOSCOPY;  Surgeon: Grace Isaac, MD;  Location: [place];  Service: Thoracic;  Laterality: N/A;  Pt's medical and social history were reviewed. Specifically, we reviewed PMH/PSH/Meds/FH.  Also reviewed alcohol, tobacco, and illicit drug use.  Diet and physical activity reviewed.  Plans on doing water exercise at [facility] very soon. All of this info is also found in the appropriate sections of pt's EMR.  Pt was screened with appropriate screening instrument for depression.  Current or past experiences with mood disorders was discussed.  No depressed mood.  Depression screen PHQ 2/9 07/31/2020  Decreased Interest 0  Down, Depressed, Hopeless 0  PHQ - 2 Score 0    Fall Risk  06/10/2021  11/05/2018 03/14/2018  Falls in the past year? 0 0 Yes  Number falls in past yr: 0 - 1  Injury with Fall? 0 - Yes  Follow up Falls evaluation completed - Falls prevention discussed     Pt's functional ability and level of safety were reviewed. Specifically, I screened for hearing impairment and fall risk.  I assessed home safety and we discussed pt's competency with activities of daily living.  She is fully competent.  EXAM: Vitals with BMI 06/10/2021 06/10/2021 04/28/2021  Height 5\' 7"  5\' 7"  5\' 7"   Weight 336 lbs 13 oz 330 lbs 337 lbs 13 oz  BMI 52.74 41.32 44.01  Systolic 027 - 253  Diastolic 57 - 74  Pulse 67 - 60    Visual acuity screen: Pt gets routine eye care through optometrist/ophthalmologist ([facility]) and is up to date with follow up care with this provider} No additional physical exam required or indicated today.  End of life planning: Advanced directives and power of attorney information specific to the patient were discussed.  Blue Book given to patient today.  Education, counseling, and referral for other preventive services: Written checklist was completed and given to pt for obtaining, as appropriate, the other preventive services that are covered as separate Medicare Part B benefits. Possible services that were reviewed with pt are:  -annual wellness visit (AWV) -Bone mass measurements->will order DEXA today.  -Cardiovascular screening blood tests: she gets lipids and glucose/a1c checked twice a year. -Colorectal cancer screening: pt scheduled for colonoscopy 06/15/21 ([facility]). -Counseling to prevent tobacco use. -Diabetes screening tests: n/a->pt with dx of prediabetes and is on metformin and gets regular Hba1c testing. -Diabetes self-management training (DSMT). -Glaucoma screening->eye MD. -HIV screening->screening neg 10/20/18. -Medical nutrition therapy. -Prostate cancer screening->n/a -Seasonal influenza, pneumococcal, and Hep B vaccines-->prevnar 20  today. -screening mammography: normal mammogram 01/2021->rpt 1 yr. -screening pap tests and pelvic exam: n/a b/c pt with hx of TAH for benign dx. -ultrasound screening for AAA->doesn't qualify. -Lung ca screening: pt has 40 pack-yr hx of smoking, quit in 2011-->qualifies for screening CT->ordered today.   Patient did not have an additional complaint/problem other than wanting to get Hba1c monitoring (prediabetes, on metformin). POC Hba1c today 5.7%.  Cont metformin 1000mg  bid.  Patient was given opportunity to ask any additional questions regarding Medicare and covered benefits.  Patient was informed that Medicare does not provide coverage for routine physical exams.  I answered all questions to the best of my ability today.    Follow up: 6 mo RCI  Signed:  Crissie Sickles, MD           06/10/2021

## 2021-06-15 ENCOUNTER — Other Ambulatory Visit: Payer: Self-pay

## 2021-06-15 ENCOUNTER — Ambulatory Visit (HOSPITAL_COMMUNITY)
Admission: RE | Admit: 2021-06-15 | Discharge: 2021-06-15 | Disposition: A | Discharge status: Home / Self Care | Payer: Medicare Other | Attending: Gastroenterology | Admitting: Gastroenterology

## 2021-06-15 ENCOUNTER — Encounter (HOSPITAL_COMMUNITY): Payer: Self-pay | Admitting: Gastroenterology

## 2021-06-15 ENCOUNTER — Ambulatory Visit (HOSPITAL_COMMUNITY): Payer: Medicare Other | Admitting: Anesthesiology

## 2021-06-15 ENCOUNTER — Encounter (HOSPITAL_COMMUNITY)
Admission: RE | Disposition: A | Discharge status: Home / Self Care | Payer: Self-pay | Source: Home / Self Care | Attending: Gastroenterology

## 2021-06-15 DIAGNOSIS — Z1211 Encounter for screening for malignant neoplasm of colon: Secondary | ICD-10-CM | POA: Insufficient documentation

## 2021-06-15 DIAGNOSIS — Z8719 Personal history of other diseases of the digestive system: Secondary | ICD-10-CM | POA: Insufficient documentation

## 2021-06-15 DIAGNOSIS — R7303 Prediabetes: Secondary | ICD-10-CM | POA: Insufficient documentation

## 2021-06-15 DIAGNOSIS — K648 Other hemorrhoids: Secondary | ICD-10-CM | POA: Insufficient documentation

## 2021-06-15 DIAGNOSIS — Z833 Family history of diabetes mellitus: Secondary | ICD-10-CM | POA: Diagnosis not present

## 2021-06-15 DIAGNOSIS — D1779 Benign lipomatous neoplasm of other sites: Secondary | ICD-10-CM | POA: Insufficient documentation

## 2021-06-15 DIAGNOSIS — J4530 Mild persistent asthma, uncomplicated: Secondary | ICD-10-CM | POA: Diagnosis not present

## 2021-06-15 DIAGNOSIS — K5730 Diverticulosis of large intestine without perforation or abscess without bleeding: Secondary | ICD-10-CM | POA: Diagnosis not present

## 2021-06-15 DIAGNOSIS — K5520 Angiodysplasia of colon without hemorrhage: Secondary | ICD-10-CM | POA: Diagnosis not present

## 2021-06-15 DIAGNOSIS — Z880 Allergy status to penicillin: Secondary | ICD-10-CM | POA: Insufficient documentation

## 2021-06-15 DIAGNOSIS — Z8249 Family history of ischemic heart disease and other diseases of the circulatory system: Secondary | ICD-10-CM | POA: Diagnosis not present

## 2021-06-15 DIAGNOSIS — Z7984 Long term (current) use of oral hypoglycemic drugs: Secondary | ICD-10-CM | POA: Insufficient documentation

## 2021-06-15 DIAGNOSIS — Z8261 Family history of arthritis: Secondary | ICD-10-CM | POA: Diagnosis not present

## 2021-06-15 DIAGNOSIS — Z7951 Long term (current) use of inhaled steroids: Secondary | ICD-10-CM | POA: Insufficient documentation

## 2021-06-15 DIAGNOSIS — Z87891 Personal history of nicotine dependence: Secondary | ICD-10-CM | POA: Insufficient documentation

## 2021-06-15 DIAGNOSIS — D175 Benign lipomatous neoplasm of intra-abdominal organs: Secondary | ICD-10-CM | POA: Diagnosis not present

## 2021-06-15 HISTORY — PX: COLONOSCOPY WITH PROPOFOL: SHX5780

## 2021-06-15 HISTORY — PX: COLONOSCOPY: SHX174

## 2021-06-15 LAB — HM COLONOSCOPY

## 2021-06-15 LAB — GLUCOSE, CAPILLARY: Glucose-Capillary: 104 mg/dL — ABNORMAL HIGH (ref 70–99)

## 2021-06-15 SURGERY — COLONOSCOPY WITH PROPOFOL
Anesthesia: Monitor Anesthesia Care

## 2021-06-15 MED ORDER — LIDOCAINE 2% (20 MG/ML) 5 ML SYRINGE
INTRAMUSCULAR | Status: DC | PRN
  Administered 2021-06-15: 40 mg via INTRAVENOUS

## 2021-06-15 MED ORDER — PROPOFOL 10 MG/ML IV BOLUS
INTRAVENOUS | Status: DC | PRN
  Administered 2021-06-15: 50 mg via INTRAVENOUS
  Administered 2021-06-15: 20 mg via INTRAVENOUS
  Administered 2021-06-15 (×2): 50 mg via INTRAVENOUS
  Administered 2021-06-15: 20 mg via INTRAVENOUS
  Administered 2021-06-15: 30 mg via INTRAVENOUS
  Administered 2021-06-15 (×2): 20 mg via INTRAVENOUS
  Administered 2021-06-15: 50 mg via INTRAVENOUS
  Administered 2021-06-15 (×2): 20 mg via INTRAVENOUS
  Administered 2021-06-15: 40 mg via INTRAVENOUS
  Administered 2021-06-15: 50 mg via INTRAVENOUS

## 2021-06-15 MED ORDER — LACTATED RINGERS IV SOLN: INTRAVENOUS | Status: DC | PRN

## 2021-06-15 MED ORDER — LACTATED RINGERS IV SOLN: Freq: Once | INTRAVENOUS | Status: AC

## 2021-06-15 MED ORDER — SODIUM CHLORIDE 0.9 % IV SOLN: INTRAVENOUS | Status: DC

## 2021-06-15 SURGICAL SUPPLY — 21 items

## 2021-06-15 NOTE — Discharge Instructions (Signed)
YOU HAD AN ENDOSCOPIC PROCEDURE TODAY: Refer to the procedure report and other information in the discharge instructions given to you for any specific questions about what was found during the examination. If this information does not answer your questions, please call [facility] office at [phone] to clarify.   YOU SHOULD EXPECT: Some feelings of bloating in the abdomen. Passage of more gas than usual. Walking can help get rid of the air that was put into your GI tract during the procedure and reduce the bloating. If you had a lower endoscopy (such as a colonoscopy or flexible sigmoidoscopy) you may notice spotting of blood in your stool or on the toilet paper. Some abdominal soreness may be present for a day or two, also.  DIET: Your first meal following the procedure should be a light meal and then it is ok to progress to your normal diet. A half-sandwich or bowl of soup is an example of a good first meal. Heavy or fried foods are harder to digest and may make you feel nauseous or bloated. Drink plenty of fluids but you should avoid alcoholic beverages for 24 hours. If you had a esophageal dilation, please see attached instructions for diet.    ACTIVITY: Your care partner should take you home directly after the procedure. You should plan to take it easy, moving slowly for the rest of the day. You can resume normal activity the day after the procedure however YOU SHOULD NOT DRIVE, use power tools, machinery or perform tasks that involve climbing or major physical exertion for 24 hours (because of the sedation medicines used during the test).   SYMPTOMS TO REPORT IMMEDIATELY: A gastroenterologist can be reached at any hour. Please call [phone]  for any of the following symptoms:  Following lower endoscopy (colonoscopy, flexible sigmoidoscopy) Excessive amounts of blood in the stool  Significant tenderness, worsening of abdominal pains  Swelling of the abdomen that is new, acute  Fever of 100  or higher  Following upper endoscopy (EGD, EUS, ERCP, esophageal dilation) Vomiting of blood or coffee ground material  New, significant abdominal pain  New, significant chest pain or pain under the shoulder blades  Painful or persistently difficult swallowing  New shortness of breath  Black, tarry-looking or red, bloody stools  FOLLOW UP:  If any biopsies were taken you will be contacted by phone or by letter within the next 1-3 weeks. Call [phone]  if you have not heard about the biopsies in 3 weeks.  Please also call with any specific questions about appointments or follow up tests. YOU HAD AN ENDOSCOPIC PROCEDURE TODAY: Refer to the procedure report and other information in the discharge instructions given to you for any specific questions about what was found during the examination. If this information does not answer your questions, please call [facility] office at [phone] to clarify.   YOU SHOULD EXPECT: Some feelings of bloating in the abdomen. Passage of more gas than usual. Walking can help get rid of the air that was put into your GI tract during the procedure and reduce the bloating. If you had a lower endoscopy (such as a colonoscopy or flexible sigmoidoscopy) you may notice spotting of blood in your stool or on the toilet paper. Some abdominal soreness may be present for a day or two, also.  DIET: Your first meal following the procedure should be a light meal and then it is ok to progress to your normal diet. A half-sandwich or bowl of soup is an   example of a good first meal. Heavy or fried foods are harder to digest and may make you feel nauseous or bloated. Drink plenty of fluids but you should avoid alcoholic beverages for 24 hours. If you had a esophageal dilation, please see attached instructions for diet.    ACTIVITY: Your care partner should take you home directly after the procedure. You should plan to take it easy, moving slowly for the rest of the day. You can resume  normal activity the day after the procedure however YOU SHOULD NOT DRIVE, use power tools, machinery or perform tasks that involve climbing or major physical exertion for 24 hours (because of the sedation medicines used during the test).   SYMPTOMS TO REPORT IMMEDIATELY: A gastroenterologist can be reached at any hour. Please call [phone]  for any of the following symptoms:  Following lower endoscopy (colonoscopy, flexible sigmoidoscopy) Excessive amounts of blood in the stool  Significant tenderness, worsening of abdominal pains  Swelling of the abdomen that is new, acute  Fever of 100 or higher    FOLLOW UP:  If any biopsies were taken you will be contacted by phone or by letter within the next 1-3 weeks. Call [phone]  if you have not heard about the biopsies in 3 weeks.  Please also call with any specific questions about appointments or follow up tests. 

## 2021-06-15 NOTE — Anesthesia Postprocedure Evaluation (Signed)
Anesthesia Post Note  Patient: Taylor Leon  Procedure(s) Performed: COLONOSCOPY WITH PROPOFOL     Patient location during evaluation: Endoscopy Anesthesia Type: MAC Level of consciousness: awake and alert Pain management: pain level controlled Vital Signs Assessment: post-procedure vital signs reviewed and stable Respiratory status: spontaneous breathing, nonlabored ventilation, respiratory function stable and patient connected to nasal cannula oxygen Cardiovascular status: stable and blood pressure returned to baseline Postop Assessment: no apparent nausea or vomiting Anesthetic complications: no   No notable events documented.  Last Vitals:  Vitals:   06/15/21 1140 06/15/21 1150  BP: (!) 94/45 (!) 98/51  Pulse: (!) 57 (!) 59  Resp: (!) 22 20  Temp: 36.5 C   SpO2: 96% 99%    Last Pain:  Vitals:   06/15/21 1150  TempSrc:   PainSc: 0-No pain                 Belenda Cruise P Cristofher Livecchi

## 2021-06-15 NOTE — Op Note (Signed)
[facility] Patient Name: Taylor Leon Procedure Date: 06/15/2021 MRN: [id number] Attending MD: Otis Brace , MD Date of Birth: [birth year] CSN: [id number] Age: 65 Admit Type: Outpatient Procedure:                Colonoscopy Indications:              Screening for colorectal malignant neoplasm, Last                            colonoscopy 10 years ago Providers:                Otis Brace, MD, Nelia Shi, RN,                            Fransico Setters Mbumina, Technician Referring MD:              Medicines:                Sedation Administered by an Anesthesia Professional Complications:            No immediate complications. Estimated Blood Loss:     Estimated blood loss: none. Procedure:                Pre-Anesthesia Assessment:                           - Prior to the procedure, a History and Physical                            was performed, and patient medications and                            allergies were reviewed. The patient's tolerance of                            previous anesthesia was also reviewed. The risks                            and benefits of the procedure and the sedation                            options and risks were discussed with the patient.                            All questions were answered, and informed consent                            was obtained. Prior Anticoagulants: The patient has                            taken no previous anticoagulant or antiplatelet                            agents. ASA Grade Assessment: III - A patient with  severe systemic disease. After reviewing the risks                            and benefits, the patient was deemed in                            satisfactory condition to undergo the procedure.                           After obtaining informed consent, the colonoscope                            was passed under direct vision. Throughout the                             procedure, the patient's blood pressure, pulse, and                            oxygen saturations were monitored continuously. The                            PCF-H190DL ([id number]) Olympus pediatric colonscope                            was introduced through the anus and advanced to the                            the cecum, identified by appendiceal orifice and                            ileocecal valve. The colonoscopy was performed                            without difficulty. The patient tolerated the                            procedure well. The quality of the bowel                            preparation was adequate to identify polyps 6 mm                            and larger in size. Scope In: 11:14:40 AM Scope Out: 11:30:28 AM Scope Withdrawal Time: 0 hours 7 minutes 53 seconds  Total Procedure Duration: 0 hours 15 minutes 48 seconds  Findings:      The perianal and digital rectal examinations were normal.      A single large localized angioectasia without bleeding was found in the       cecum.      There was a medium-sized lipoma, at the hepatic flexure.      Scattered diverticula were found in the entire colon.      Internal hemorrhoids were found during retroflexion. The hemorrhoids       were medium-sized. Impression:               -  A single non-bleeding colonic angioectasia.                           - Medium-sized lipoma at the hepatic flexure.                           - Diverticulosis in the entire examined colon.                           - Internal hemorrhoids.                           - No specimens collected. Moderate Sedation:      Moderate (conscious) sedation was personally administered by an       anesthesia professional. The following parameters were monitored: oxygen       saturation, heart rate, blood pressure, and response to care. Recommendation:           - Patient has a contact number available for                            emergencies. The  signs and symptoms of potential                            delayed complications were discussed with the                            patient. Return to normal activities tomorrow.                            Written discharge instructions were provided to the                            patient.                           - Resume previous diet.                           - Continue present medications.                           - Repeat colonoscopy in 10 years for screening                            purposes.                           - Return to my office PRN. Procedure Code(s):        --- Professional ---                           G3875, Colorectal cancer screening; colonoscopy on                            individual not meeting criteria for high risk Diagnosis Code(s):        --- Professional ---  Z12.11, Encounter for screening for malignant                            neoplasm of colon                           K55.20, Angiodysplasia of colon without hemorrhage                           D17.5, Benign lipomatous neoplasm of                            intra-abdominal organs                           K64.8, Other hemorrhoids                           K57.30, Diverticulosis of large intestine without                            perforation or abscess without bleeding CPT copyright 2019 American Medical Association. All rights reserved. The codes documented in this report are preliminary and upon coder review may  be revised to meet current compliance requirements. Otis Brace, MD Otis Brace, MD 06/15/2021 11:35:54 AM Number of Addenda: 0

## 2021-06-15 NOTE — Anesthesia Procedure Notes (Signed)
Procedure Name: MAC Date/Time: 06/15/2021 11:05 AM Performed by: Cynda Familia, CRNA Pre-anesthesia Checklist: Patient identified, Suction available, Emergency Drugs available and Patient being monitored Oxygen Delivery Method: Simple face mask Placement Confirmation: positive ETCO2 and breath sounds checked- equal and bilateral Dental Injury: Teeth and Oropharynx as per pre-operative assessment

## 2021-06-15 NOTE — Anesthesia Preprocedure Evaluation (Signed)
Anesthesia Evaluation  Patient identified by MRN, date of birth, ID band Patient awake    Reviewed: Allergy & Precautions, NPO status , Patient's Chart, lab work & pertinent test results  Airway Mallampati: II  TM Distance: >3 FB Neck ROM: Full    Dental  (+) Teeth Intact   Pulmonary asthma , sleep apnea and Continuous Positive Airway Pressure Ventilation , former smoker,    Pulmonary exam normal        Cardiovascular negative cardio ROS   Rhythm:Regular Rate:Normal     Neuro/Psych negative neurological ROS  negative psych ROS   GI/Hepatic negative GI ROS, Neg liver ROS, CRC screening    Endo/Other  diabetes, Type 2, Oral Hypoglycemic AgentsMorbid obesity  Renal/GU Renal disease  negative genitourinary   Musculoskeletal  (+) Arthritis , Osteoarthritis,    Abdominal (+)  Abdomen: soft. Bowel sounds: normal.  Peds  Hematology negative hematology ROS (+)   Anesthesia Other Findings   Reproductive/Obstetrics                             Anesthesia Physical Anesthesia Plan  ASA: 3  Anesthesia Plan: MAC   Post-op Pain Management:    Induction: Intravenous  PONV Risk Score and Plan: 2 and Treatment may vary due to age or medical condition and Propofol infusion  Airway Management Planned: Simple Face Mask, Natural Airway and Nasal Cannula  Additional Equipment: None  Intra-op Plan:   Post-operative Plan:   Informed Consent: I have reviewed the patients History and Physical, chart, labs and discussed the procedure including the risks, benefits and alternatives for the proposed anesthesia with the patient or authorized representative who has indicated his/her understanding and acceptance.     Dental advisory given  Plan Discussed with: CRNA  Anesthesia Plan Comments: (Lab Results      Component                Value               Date                      WBC                       8.4                 02/08/2021                HGB                      12.7                02/08/2021                HCT                      39.2                02/08/2021                MCV                      83.6                02/08/2021                PLT  273.0               02/08/2021           Lab Results      Component                Value               Date                      NA                       142                 02/08/2021                K                        4.6                 02/08/2021                CO2                      27                  02/08/2021                GLUCOSE                  104 (H)             02/08/2021                BUN                      17                  02/08/2021                CREATININE               0.85                02/08/2021                CALCIUM                  9.6                 02/08/2021                GFRNONAA                 >60                 10/30/2018                GFRAA                    >60                 10/30/2018          )        Anesthesia Quick Evaluation

## 2021-06-15 NOTE — H&P (Signed)
Primary Care Physician:  Tammi Sou, MD Primary Gastroenterologist:  Dr. Alessandra Bevels  Reason for Visit : Colon cancer screening  HPI: Taylor Leon is a 65 y.o. female with past medical history of morbid obesity with BMI more than 50 and history of mild persistent asthma. She is status post cholecystectomy.        Last colonoscopy in 2011 in [place] showed lipoma in the ascending colon and small diminutive polyp in the rectum. path Report not available to review.        Patient denies any GI symptoms.         No family history of colon cancer.  Past Medical History:  Diagnosis Date   BPPV (benign paroxysmal positional vertigo) 01/30/2014   CAP (community acquired pneumonia) 10/2018   with acute hypoxic RF, empyema-->VATS   Cyst, breast 01/2012; 06/2012; 01/27/01; 04/28/26   Complicated cysts in upper outer left breast--no evidence of malignancy--f/u bilat diag mammo/left breast u/s on 03/06/15 showed resolution of left breast cysts.  Repeat screening mammogram 1 yr.   Empyema ([place])    drained by CT surgery/VATS.  Full recovery as of 12/2018 CT surgery f/u visit.   H/O allergic rhinitis    Dr. Ishmael Holter   Hepatic cyst 2019   Noted on CT chest imaging.  Multiple, small, "simple"   History of pneumonia 2009 and 2010   supplemential oxygen d/c'd 12/16/18 by pulm   Mild persistent asthma, well controlled 2014   New adult onset asthma after respiratory infection (Dr. Ishmael Holter)   Morbid obesity ([place])    BMI 50+.  At one point in time she was going to Bariatric clinic on [address] and got weekly HCG injections, monthly vit B12 injections, and took phentermine daily.  As of 05/2015 she was no longer doing this.   Nephrolithiasis 04/2018 first episode   [facility]. [facility] Neighbours ([facility]): 1.36mm right UVJ stone + 6 mm L AML.  Pt elected for trial of passage.   OSA (obstructive sleep apnea) 10/2018   Mild/mod OSA 12/2018--pt not able to afford CPAP as of 01/2019.   Osteoarthritis of both knees    No improvement  with steroid injections; did synvisc x 3 yrs; bilat replacement was recommended but she declined.   Prediabetes    she's on metformin   Recurrent low back pain     Past Surgical History:  Procedure Laterality Date   APPENDECTOMY  1978   CARDIAC CATHETERIZATION  2010   Normal coronaries per pt   CARDIOVASCULAR STRESS TEST  2010   abnl per pt; f/u cath clean   CESAREAN SECTION     x 3   CHOLECYSTECTOMY  1986   COLONOSCOPY  2011   lipoma and small diminutive polyp in rectum (in [place])-->[facility] to do repeat as of 04/05/21   EMPYEMA DRAINAGE Left 10/24/2018   Evacuation of empyema with decortication.  Procedure: EMPYEMA DRAINAGE;  Surgeon: Grace Isaac, MD;  Location: [place];  Service: Thoracic;  Laterality: Left;   LUMBAR DISC SURGERY  1993   L5/S1   TONSILLECTOMY AND ADENOIDECTOMY  1967ish   TOTAL ABDOMINAL HYSTERECTOMY  1994   Ovaries are still in.  This was done for DUB/fibroids.   VIDEO ASSISTED THORACOSCOPY (VATS)/THOROCOTOMY Left 10/24/2018   Procedure: VIDEO ASSISTED THORACOSCOPY (VATS)/THOROCOTOMY with EVACUATION OF EMPYEMA AND DECORTICATION.;  Surgeon: Grace Isaac, MD;  Location: [place];  Service: Thoracic;  Laterality: Left;   VIDEO BRONCHOSCOPY N/A 10/24/2018   Procedure: VIDEO BRONCHOSCOPY;  Surgeon:  Grace Isaac, MD;  Location: [facility];  Service: Thoracic;  Laterality: N/A;    Prior to Admission medications   Medication Sig Start Date End Date Taking? Authorizing Provider  albuterol (VENTOLIN HFA) 108 (90 Base) MCG/ACT inhaler Inhale 2 puffs into the lungs every 4 (four) hours as needed for wheezing. 02/08/21 04/15/23 Yes McGowen, Adrian Blackwater, MD  Ascorbic Acid (VITAMIN C) 1000 MG tablet Take 1,000 mg by mouth daily.   Yes [provider]  cholecalciferol (VITAMIN D) 25 MCG (1000 UNIT) tablet Take 1,000 Units by mouth daily.   Yes [provider]  magnesium gluconate (MAGONATE) 500 MG tablet Take 500 mg by mouth daily.   Yes [provider]  metFORMIN (GLUCOPHAGE) 1000 MG tablet 1 tab po bid with meals Patient taking differently: Take 1,000 mg by mouth 2 (two) times daily with a meal. 02/15/21  Yes McGowen, Adrian Blackwater, MD  Multiple Vitamin (MULTIVITAMIN) tablet Take 1 tablet by mouth daily.   Yes [provider]  albuterol (PROVENTIL) (2.5 MG/3ML) 0.083% nebulizer solution Take 3 mLs (2.5 mg total) by nebulization every 6 (six) hours as needed for wheezing. Patient not taking: No sig reported 02/08/21   McGowen, Adrian Blackwater, MD  beclomethasone (QVAR REDIHALER) 40 MCG/ACT inhaler Inhale 2 puffs into the lungs 2 (two) times daily. Patient not taking: No sig reported 02/08/21   Tammi Sou, MD  glucose blood (CONTOUR NEXT TEST) test strip Use to check blood sugar once daily 12/13/17   McGowen, Adrian Blackwater, MD  glucose blood test strip Use to check blood sugar once daily. 02/08/21   McGowen, Adrian Blackwater, MD  Lancets [facility] ULTRASOFT) lancets Use to check blood sugar once daily. 02/08/21   McGowen, Adrian Blackwater, MD  OVER THE COUNTER MEDICATION daily. GOLO    [provider]  Respiratory Therapy Supplies (NEBULIZER AIR TUBE/PLUGS) MISC Use with nebulizer every 6(six) hours as needed for wheezing. 02/08/21   McGowen, Adrian Blackwater, MD    Scheduled Meds: Continuous Infusions:  sodium chloride     PRN Meds:.  Allergies as of 04/05/2021 - Review Complete 02/08/2021  Allergen Reaction Noted   Penicillins Nausea Only and Rash 03/28/2012    Family History  Problem Relation Age of Onset   Arthritis Mother    Heart disease Mother 27   Hypertension Mother    Diabetes Mother    Colon cancer Mother        40's   Arthritis Paternal Grandmother    Breast cancer Neg Hx     Social History   Socioeconomic History   Marital status: Married    Spouse name: Not on file   Number of children: 4   Years of education: Not on file   Highest education level: Not on file  Occupational History   Not on file  Tobacco Use   Smoking  status: Former    Packs/day: 0.75    Years: 34.00    Pack years: 25.50    Types: Cigarettes    Start date: 08/1976    Quit date: 12/28/2009    Years since quitting: 11.4   Smokeless tobacco: Never  Vaping Use   Vaping Use: Never used  Substance and Sexual Activity   Alcohol use: Yes    Comment: social   Drug use: No   Sexual activity: Not on file  Other Topics Concern   Not on file  Social History Narrative   Married, 4 grown children.   Relocated to [place], [place] from [place] about 2012.   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   Exercise: intermittently does water aerobics..            Social Determinants of Health   Financial Resource Strain: Not on file  Food Insecurity: Not on file  Transportation Needs: Not on file  Physical Activity: Not on file  Stress: Not on file  Social Connections: Not on file  Intimate Partner Violence: Not on file    Review of Systems: All negative except as stated above in HPI.  Physical Exam: Vital signs: Vitals:   06/15/21 1033  BP: (!) 121/52  Pulse: 74  Resp: (!) 22  Temp: 98.7 F (37.1 C)  SpO2: 97%     General:   Alert, morbidly obese, pleasant and cooperative in NAD Lungs:  Clear throughout to auscultation.   No wheezes, crackles, or rhonchi. No acute distress. Heart:  Regular rate and rhythm; no murmurs, clicks, rubs,  or gallops. Abdomen: Soft, nontender, nondistended, bowel sounds present.  No peritoneal signs Rectal:  Deferred  GI:  Lab Results: No results for input(s): WBC, HGB, HCT, PLT in the last 72 hours. BMET No results for input(s): NA, K, CL, CO2, GLUCOSE, BUN, CREATININE, CALCIUM in the last 72 hours. LFT No results for input(s): PROT, ALBUMIN, AST, ALT, ALKPHOS, BILITOT, BILIDIR, IBILI in the last 72 hours. PT/INR No results for input(s): LABPROT, INR in the last 72 hours.   Studies/Results: No results  found.  Impression/Plan: Colon cancer screening  obesity  Recommendations ------------------------ -Proceed with colonoscopy today.  Risks (bleeding, infection, bowel perforation that could require surgery, sedation-related changes in cardiopulmonary systems), benefits (identification and possible treatment of source of symptoms, exclusion of certain causes of symptoms), and alternatives (watchful waiting, radiographic imaging studies, empiric medical treatment)  were explained to patient/family in detail and patient wishes to proceed.     LOS: 0 days   Otis Brace  MD, FACP 06/15/2021, 10:38 AM  Contact #  [phone]

## 2021-06-15 NOTE — Transfer of Care (Signed)
Immediate Anesthesia Transfer of Care Note  Patient: Taylor Leon  Procedure(s) Performed: COLONOSCOPY WITH PROPOFOL  Patient Location: PACU and Endoscopy Unit  Anesthesia Type:MAC  Level of Consciousness: sedated  Airway & Oxygen Therapy: Patient Spontanous Breathing and Patient connected to face mask oxygen  Post-op Assessment: Report given to RN and Post -op Vital signs reviewed and stable  Post vital signs: Reviewed and stable  Last Vitals:  Vitals Value Taken Time  BP 92/47 06/15/21 1137  Temp    Pulse 57 06/15/21 1139  Resp 17 06/15/21 1139  SpO2 95 % 06/15/21 1139  Vitals shown include unvalidated device data.  Last Pain:  Vitals:   06/15/21 1033  TempSrc: Oral  PainSc: 0-No pain         Complications: No notable events documented.

## 2021-06-17 ENCOUNTER — Encounter (HOSPITAL_COMMUNITY): Payer: Self-pay | Admitting: Gastroenterology

## 2021-06-24 ENCOUNTER — Encounter: Payer: Self-pay | Admitting: Family Medicine

## 2021-06-25 HISTORY — PX: OTHER SURGICAL HISTORY: SHX169

## 2021-06-29 ENCOUNTER — Ambulatory Visit (HOSPITAL_BASED_OUTPATIENT_CLINIC_OR_DEPARTMENT_OTHER)
Admission: RE | Admit: 2021-06-29 | Discharge: 2021-06-29 | Disposition: A | Discharge status: Home / Self Care | Payer: Medicare Other | Source: Ambulatory Visit | Attending: Family Medicine | Admitting: Family Medicine

## 2021-06-29 ENCOUNTER — Other Ambulatory Visit: Payer: Self-pay

## 2021-06-29 DIAGNOSIS — E2839 Other primary ovarian failure: Secondary | ICD-10-CM | POA: Insufficient documentation

## 2021-06-29 DIAGNOSIS — Z87891 Personal history of nicotine dependence: Secondary | ICD-10-CM | POA: Insufficient documentation

## 2021-06-29 DIAGNOSIS — R911 Solitary pulmonary nodule: Secondary | ICD-10-CM

## 2021-06-29 DIAGNOSIS — Z780 Asymptomatic menopausal state: Secondary | ICD-10-CM | POA: Diagnosis not present

## 2021-06-29 HISTORY — DX: Solitary pulmonary nodule: R91.1

## 2021-07-04 ENCOUNTER — Encounter: Payer: Self-pay | Admitting: Family Medicine

## 2021-07-21 DIAGNOSIS — R911 Solitary pulmonary nodule: Secondary | ICD-10-CM

## 2021-07-22 NOTE — Telephone Encounter (Signed)
Dr. Alva, Please see patient comment and advise.  Thank you 

## 2021-08-21 ENCOUNTER — Other Ambulatory Visit: Payer: Self-pay | Admitting: Family Medicine

## 2021-09-10 ENCOUNTER — Ambulatory Visit: Payer: Medicare Other | Admitting: Family Medicine

## 2021-09-14 ENCOUNTER — Other Ambulatory Visit: Payer: Self-pay

## 2021-09-14 ENCOUNTER — Ambulatory Visit (INDEPENDENT_AMBULATORY_CARE_PROVIDER_SITE_OTHER): Payer: Medicare Other | Admitting: Family Medicine

## 2021-09-14 ENCOUNTER — Encounter: Payer: Self-pay | Admitting: Family Medicine

## 2021-09-14 VITALS — BP 99/66 | HR 70 | Temp 98.1°F | Resp 16 | Ht 67.0 in | Wt 342.4 lb

## 2021-09-14 DIAGNOSIS — R7303 Prediabetes: Secondary | ICD-10-CM

## 2021-09-14 DIAGNOSIS — Z23 Encounter for immunization: Secondary | ICD-10-CM | POA: Diagnosis not present

## 2021-09-14 LAB — POCT GLYCOSYLATED HEMOGLOBIN (HGB A1C)
HbA1c POC (<> result, manual entry): 5.7 % (ref 4.0–5.6)
HbA1c, POC (controlled diabetic range): 5.7 % (ref 0.0–7.0)
HbA1c, POC (prediabetic range): 5.7 % (ref 5.7–6.4)
Hemoglobin A1C: 5.7 % — AB (ref 4.0–5.6)

## 2021-09-14 NOTE — Progress Notes (Signed)
OFFICE VISIT  09/14/2021  CC:  Chief Complaint  Patient presents with   Follow-up    [facility]CI,    HPI:    Patient is a 65 y.o. African-American female who presents for 3 mo f/u prediabetes, BMI 51.  Last visit a1c excellent ->5.7% (POC). She is quite apprehensive about a1c rising, chooses to stay on 1000mg  metformin bid. Home glucoses 105-120, usually in 'teens.  Admits she is not as active as she should be. Diet is good.  No acute complaints.  Past Medical History:  Diagnosis Date   BPPV (benign paroxysmal positional vertigo) 01/30/2014   CAP (community acquired pneumonia) 10/2018   with acute hypoxic RF, empyema-->VATS   Cyst, breast 01/2012; 06/2012; 04/01/95; 02/03/51   Complicated cysts in upper outer left breast--no evidence of malignancy--f/u bilat diag mammo/left breast u/s on 03/06/15 showed resolution of left breast cysts.  Repeat screening mammogram 1 yr.   Empyema ([place])    drained by CT surgery/VATS.  Full recovery as of 12/2018 CT surgery f/u visit.   H/O allergic rhinitis    Dr. Ishmael Holter   Hepatic cyst 2019   Noted on CT chest imaging.  Multiple, small, "simple"   History of pneumonia 2009 and 2010   supplemential oxygen d/c'd 12/16/18 by pulm   Mild persistent asthma, well controlled 2014   New adult onset asthma after respiratory infection (Dr. Ishmael Holter)   Morbid obesity ([place])    BMI 50+.  At one point in time she was going to Bariatric clinic on [address] and got weekly HCG injections, monthly vit B12 injections, and took phentermine daily.  As of 05/2015 she was no longer doing this.   Nephrolithiasis 04/2018 first episode   Dr. Lovena Neighbours ([facility]): 1.74mm right UVJ stone + 6 mm L AML.  Pt elected for trial of passage.   OSA (obstructive sleep apnea) 10/2018   Mild/mod OSA 12/2018--pt not able to afford CPAP as of 01/2019.   Osteoarthritis of both knees    No improvement with steroid injections; did synvisc x 3 yrs; bilat replacement was recommended but she declined.    Prediabetes    she's on metformin   Pulmonary nodule 06/29/2021   on lung ca screening CT->repeat 6 mo   Recurrent low back pain     Past Surgical History:  Procedure Laterality Date   APPENDECTOMY  1978   CARDIAC CATHETERIZATION  2010   Normal coronaries per pt   CARDIOVASCULAR STRESS TEST  2010   abnl per pt; f/u cath clean   CESAREAN SECTION     x 3   CHOLECYSTECTOMY  1986   COLONOSCOPY  2011   lipoma and small diminutive polyp in rectum (in [place])-->[facility] to do repeat as of 04/05/21   COLONOSCOPY  06/15/2021   Normal-recall 10 yrs   COLONOSCOPY WITH PROPOFOL N/A 06/15/2021   Procedure: COLONOSCOPY WITH PROPOFOL;  Surgeon: Otis Brace, MD;  Location: [facility];  Service: Gastroenterology;  Laterality: N/A;   DEXA  06/2021   06/2021 NORMAL-> rpt 2 yrs.   EMPYEMA DRAINAGE Left 10/24/2018   Evacuation of empyema with decortication.  Procedure: EMPYEMA DRAINAGE;  Surgeon: Grace Isaac, MD;  Location: [place];  Service: Thoracic;  Laterality: Left;   LUMBAR DISC SURGERY  1993   L5/S1   TONSILLECTOMY AND ADENOIDECTOMY  1967ish   TOTAL ABDOMINAL HYSTERECTOMY  1994   Ovaries are still in.  This was done for DUB/fibroids.   VIDEO ASSISTED THORACOSCOPY (VATS)/THOROCOTOMY Left 10/24/2018  Procedure: VIDEO ASSISTED THORACOSCOPY (VATS)/THOROCOTOMY with EVACUATION OF EMPYEMA AND DECORTICATION.;  Surgeon: Grace Isaac, MD;  Location: [place];  Service: Thoracic;  Laterality: Left;   VIDEO BRONCHOSCOPY N/A 10/24/2018   Procedure: VIDEO BRONCHOSCOPY;  Surgeon: Grace Isaac, MD;  Location: [facility];  Service: Thoracic;  Laterality: N/A;    Outpatient Medications Prior to Visit  Medication Sig Dispense Refill   albuterol (VENTOLIN HFA) 108 (90 Base) MCG/ACT inhaler Inhale 2 puffs into the lungs every 4 (four) hours as needed for wheezing. 6.7 g 3   Ascorbic Acid (VITAMIN C) 1000 MG tablet Take 1,000 mg by mouth daily.     beclomethasone (QVAR REDIHALER) 40 MCG/ACT inhaler  Inhale 2 puffs into the lungs 2 (two) times daily. 10.6 g 2   cholecalciferol (VITAMIN D) 25 MCG (1000 UNIT) tablet Take 1,000 Units by mouth daily.     Fexofenadine HCl (ALLEGRA PO) Take by mouth daily.     glucose blood (CONTOUR NEXT TEST) test strip Use to check blood sugar once daily 100 each 11   glucose blood test strip Use to check blood sugar once daily. 100 each 12   Lancets (ONETOUCH ULTRASOFT) lancets Use to check blood sugar once daily. 100 each 12   magnesium gluconate (MAGONATE) 500 MG tablet Take 500 mg by mouth daily.     metFORMIN (GLUCOPHAGE) 1000 MG tablet Take 1 tablet (1,000 mg total) by mouth 2 (two) times daily with a meal. 180 tablet 0   Multiple Vitamin (MULTIVITAMIN) tablet Take 1 tablet by mouth daily.     OVER THE COUNTER MEDICATION daily. GOLO     Respiratory Therapy Supplies (NEBULIZER AIR TUBE/PLUGS) MISC Use with nebulizer every 6(six) hours as needed for wheezing. 1 each 5   albuterol (PROVENTIL) (2.5 MG/3ML) 0.083% nebulizer solution Take 3 mLs (2.5 mg total) by nebulization every 6 (six) hours as needed for wheezing. (Patient not taking: No sig reported) 45 mL 1   No facility-administered medications prior to visit.    Allergies  Allergen Reactions   Penicillins Nausea Only and Rash    Has patient had a PCN reaction causing immediate rash, facial/tongue/throat swelling, SOB or lightheadedness with hypotension: yES Has patient had a PCN reaction causing severe rash involving mucus membranes or skin necrosis: No Has patient had a PCN reaction that required hospitalization: No Has patient had a PCN reaction occurring within the last 10 years: No If all of the above answers are "NO", then may proceed with Cephalosporin use. CC    ROS As per HPI  PE: Vitals with BMI 09/14/2021 06/15/2021 06/15/2021  Height 5\' 7"  - -  Weight 342 lbs 6 oz - -  BMI 24.40 - -  Systolic 99 98 94  Diastolic 66 51 45  Pulse 70 59 57     Gen: Alert, well appearing.  Patient  is oriented to person, place, time, and situation. AFFECT: pleasant, lucid thought and speech. CV: RRR, no m/r/g.   LUNGS: CTA bilat, nonlabored resps, good aeration in all lung fields. EXT: no clubbing or cyanosis.  no edema.    LABS:  Lab Results  Component Value Date   TSH 1.15 02/08/2021   Lab Results  Component Value Date   WBC 8.4 02/08/2021   HGB 12.7 02/08/2021   HCT 39.2 02/08/2021   MCV 83.6 02/08/2021   PLT 273.0 02/08/2021   Lab Results  Component Value Date   CREATININE 0.85 02/08/2021   BUN 17 02/08/2021   NA  142 02/08/2021   K 4.6 02/08/2021   CL 106 02/08/2021   CO2 27 02/08/2021   Lab Results  Component Value Date   ALT 12 02/08/2021   AST 12 02/08/2021   ALKPHOS 67 02/08/2021   BILITOT 0.4 02/08/2021   Lab Results  Component Value Date   CHOL 180 02/08/2021   Lab Results  Component Value Date   HDL 83.10 02/08/2021   Lab Results  Component Value Date   LDLCALC 78 02/08/2021   Lab Results  Component Value Date   TRIG 96.0 02/08/2021   Lab Results  Component Value Date   CHOLHDL 2 02/08/2021   Lab Results  Component Value Date   HGBA1C 5.7 (A) 06/10/2021   HGBA1C 5.7 06/10/2021   HGBA1C 5.7 06/10/2021   HGBA1C 5.7 06/10/2021   IMPRESSION AND PLAN:  Prediabetes.  POC Hba1c today is 5.7%, same as 3 mo ago. Stable longterm on metformin 1000 mg bid. She prefers to NOT lower dosing. She will continue to work on diet/exercise.  An After Visit Summary was printed and given to the patient.  FOLLOW UP: Return in about 3 months (around 12/14/2021) for annual CPE (fasting).  Signed:  Crissie Sickles, MD           09/14/2021

## 2021-10-16 DIAGNOSIS — R0902 Hypoxemia: Secondary | ICD-10-CM | POA: Diagnosis not present

## 2021-10-16 DIAGNOSIS — J189 Pneumonia, unspecified organism: Secondary | ICD-10-CM | POA: Diagnosis not present

## 2021-10-16 DIAGNOSIS — G4733 Obstructive sleep apnea (adult) (pediatric): Secondary | ICD-10-CM | POA: Diagnosis not present

## 2021-10-16 DIAGNOSIS — J45909 Unspecified asthma, uncomplicated: Secondary | ICD-10-CM | POA: Diagnosis not present

## 2021-10-20 DIAGNOSIS — M170 Bilateral primary osteoarthritis of knee: Secondary | ICD-10-CM | POA: Diagnosis not present

## 2021-10-26 ENCOUNTER — Telehealth: Payer: Self-pay | Admitting: Pulmonary Disease

## 2021-10-26 DIAGNOSIS — G4733 Obstructive sleep apnea (adult) (pediatric): Secondary | ICD-10-CM

## 2021-10-26 NOTE — Telephone Encounter (Signed)
LMTCB  We will need a little more information to report to provider.

## 2021-10-27 NOTE — Telephone Encounter (Signed)
Called patient but she did not answer. Left message for her to call us back.  

## 2021-10-27 NOTE — Telephone Encounter (Signed)
Rigoberto Noel, MD  Elby Beck R, CMA; [facility] Triage Pool 4 hours ago (11:34 AM)   She is on the lowest settings already.  If she needs more effort we can increase CPAP settings, change auto to 8 to 15 cm.  Set up for mask desensitization visit at sleep center [phone], she can call for appointment      Called and spoke with pt letting her know the info stated by Dr. Elsworth Soho and she verbalized understanding. Pt said that she does not need to have her pressure increased as the pressure she feels is too much. Order has been placed for the mask fit to happen at the sleep center. Nothing further needed.

## 2021-10-27 NOTE — Telephone Encounter (Signed)
LMTCB

## 2021-10-27 NOTE — Telephone Encounter (Signed)
Called and spoke with patient who states that she needs pressure settings changed on CPAP machine. She states that she just got her machine and has only had it for a week. She feels like she is gasping for air, can only tolerate it for 15 minutes and is getting a headache. Patient uses [facility] for CPAP machine. Currently Auto CPAP 5-15   Dr. Elsworth Soho please advise

## 2021-10-28 DIAGNOSIS — M170 Bilateral primary osteoarthritis of knee: Secondary | ICD-10-CM | POA: Diagnosis not present

## 2021-11-05 DIAGNOSIS — M1711 Unilateral primary osteoarthritis, right knee: Secondary | ICD-10-CM | POA: Diagnosis not present

## 2021-11-05 DIAGNOSIS — M170 Bilateral primary osteoarthritis of knee: Secondary | ICD-10-CM | POA: Diagnosis not present

## 2021-11-05 DIAGNOSIS — M1712 Unilateral primary osteoarthritis, left knee: Secondary | ICD-10-CM | POA: Diagnosis not present

## 2021-11-10 ENCOUNTER — Other Ambulatory Visit: Payer: Self-pay | Admitting: Family Medicine

## 2021-11-16 DIAGNOSIS — R0902 Hypoxemia: Secondary | ICD-10-CM | POA: Diagnosis not present

## 2021-11-16 DIAGNOSIS — J189 Pneumonia, unspecified organism: Secondary | ICD-10-CM | POA: Diagnosis not present

## 2021-11-16 DIAGNOSIS — J45909 Unspecified asthma, uncomplicated: Secondary | ICD-10-CM | POA: Diagnosis not present

## 2021-11-16 DIAGNOSIS — G4733 Obstructive sleep apnea (adult) (pediatric): Secondary | ICD-10-CM | POA: Diagnosis not present

## 2021-11-22 ENCOUNTER — Ambulatory Visit (HOSPITAL_BASED_OUTPATIENT_CLINIC_OR_DEPARTMENT_OTHER): Payer: Medicare Other | Attending: Pulmonary Disease | Admitting: Pulmonary Disease

## 2021-11-22 DIAGNOSIS — G4733 Obstructive sleep apnea (adult) (pediatric): Secondary | ICD-10-CM

## 2021-12-10 DIAGNOSIS — J189 Pneumonia, unspecified organism: Secondary | ICD-10-CM | POA: Diagnosis not present

## 2021-12-10 DIAGNOSIS — R0902 Hypoxemia: Secondary | ICD-10-CM | POA: Diagnosis not present

## 2021-12-10 DIAGNOSIS — J45909 Unspecified asthma, uncomplicated: Secondary | ICD-10-CM | POA: Diagnosis not present

## 2021-12-10 DIAGNOSIS — G4733 Obstructive sleep apnea (adult) (pediatric): Secondary | ICD-10-CM | POA: Diagnosis not present

## 2021-12-16 DIAGNOSIS — G4733 Obstructive sleep apnea (adult) (pediatric): Secondary | ICD-10-CM | POA: Diagnosis not present

## 2021-12-16 DIAGNOSIS — R0902 Hypoxemia: Secondary | ICD-10-CM | POA: Diagnosis not present

## 2021-12-16 DIAGNOSIS — J189 Pneumonia, unspecified organism: Secondary | ICD-10-CM | POA: Diagnosis not present

## 2021-12-16 DIAGNOSIS — J45909 Unspecified asthma, uncomplicated: Secondary | ICD-10-CM | POA: Diagnosis not present

## 2021-12-28 ENCOUNTER — Other Ambulatory Visit: Payer: Self-pay

## 2021-12-28 ENCOUNTER — Encounter: Payer: Self-pay | Admitting: Family Medicine

## 2021-12-28 ENCOUNTER — Ambulatory Visit (INDEPENDENT_AMBULATORY_CARE_PROVIDER_SITE_OTHER): Payer: Medicare Other | Admitting: Family Medicine

## 2021-12-28 VITALS — BP 107/70 | HR 64 | Temp 98.2°F | Ht 67.0 in | Wt 341.0 lb

## 2021-12-28 DIAGNOSIS — R7303 Prediabetes: Secondary | ICD-10-CM

## 2021-12-28 DIAGNOSIS — R911 Solitary pulmonary nodule: Secondary | ICD-10-CM

## 2021-12-28 DIAGNOSIS — Z1231 Encounter for screening mammogram for malignant neoplasm of breast: Secondary | ICD-10-CM

## 2021-12-28 DIAGNOSIS — Z0000 Encounter for general adult medical examination without abnormal findings: Secondary | ICD-10-CM

## 2021-12-28 LAB — CBC WITH DIFFERENTIAL/PLATELET
Basophils Absolute: 0 10*3/uL (ref 0.0–0.1)
Basophils Relative: 0.6 % (ref 0.0–3.0)
Eosinophils Absolute: 0.2 10*3/uL (ref 0.0–0.7)
Eosinophils Relative: 2.9 % (ref 0.0–5.0)
HCT: 40.4 % (ref 36.0–46.0)
Hemoglobin: 12.7 g/dL (ref 12.0–15.0)
Lymphocytes Relative: 30.2 % (ref 12.0–46.0)
Lymphs Abs: 2.3 10*3/uL (ref 0.7–4.0)
MCHC: 31.4 g/dL (ref 30.0–36.0)
MCV: 85.9 fl (ref 78.0–100.0)
Monocytes Absolute: 0.5 10*3/uL (ref 0.1–1.0)
Monocytes Relative: 6.6 % (ref 3.0–12.0)
Neutro Abs: 4.6 10*3/uL (ref 1.4–7.7)
Neutrophils Relative %: 59.7 % (ref 43.0–77.0)
Platelets: 274 10*3/uL (ref 150.0–400.0)
RBC: 4.7 Mil/uL (ref 3.87–5.11)
RDW: 16 % — ABNORMAL HIGH (ref 11.5–15.5)
WBC: 7.7 10*3/uL (ref 4.0–10.5)

## 2021-12-28 LAB — COMPREHENSIVE METABOLIC PANEL
ALT: 13 U/L (ref 0–35)
AST: 15 U/L (ref 0–37)
Albumin: 3.8 g/dL (ref 3.5–5.2)
Alkaline Phosphatase: 63 U/L (ref 39–117)
BUN: 15 mg/dL (ref 6–23)
CO2: 29 mEq/L (ref 19–32)
Calcium: 9.5 mg/dL (ref 8.4–10.5)
Chloride: 106 mEq/L (ref 96–112)
Creatinine, Ser: 0.87 mg/dL (ref 0.40–1.20)
GFR: 69.68 mL/min (ref >60.00)
Glucose, Bld: 100 mg/dL — ABNORMAL HIGH (ref 70–99)
Potassium: 4.5 mEq/L (ref 3.5–5.1)
Sodium: 142 mEq/L (ref 135–145)
Total Bilirubin: 0.5 mg/dL (ref 0.2–1.2)
Total Protein: 6.6 g/dL (ref 6.0–8.3)

## 2021-12-28 LAB — LIPID PANEL
Cholesterol: 189 mg/dL (ref 0–200)
HDL: 80.3 mg/dL (ref >39.00)
LDL Cholesterol: 84 mg/dL (ref 0–99)
NonHDL: 108.31
Total CHOL/HDL Ratio: 2
Triglycerides: 120 mg/dL (ref 0.0–149.0)
VLDL: 24 mg/dL (ref 0.0–40.0)

## 2021-12-28 LAB — TSH: TSH: 1.6 u[IU]/mL (ref 0.35–5.50)

## 2021-12-28 LAB — HEMOGLOBIN A1C: Hgb A1c MFr Bld: 6.3 % (ref 4.6–6.5)

## 2021-12-28 MED ORDER — METFORMIN HCL 1000 MG PO TABS: 1000.0000 mg | ORAL_TABLET | Freq: Two times a day (BID) | ORAL | 3 refills | Status: DC

## 2021-12-28 NOTE — Addendum Note (Signed)
Addended by: Kavin Leech on: 12/28/2021 09:25 AM   Modules accepted: Orders

## 2021-12-28 NOTE — Progress Notes (Signed)
Office Note 12/28/2021  CC:  Chief Complaint  Patient presents with   Annual Exam    Pt is fasting   HPI:  Patient is a 66 y.o. female who is here for annual health maintenance exam and f/u borderline diabetes.  Home glucoses 105-120 typically. Metformin 1000mg  bid.   Past Medical History:  Diagnosis Date   BPPV (benign paroxysmal positional vertigo) 01/30/2014   CAP (community acquired pneumonia) 10/2018   with acute hypoxic RF, empyema-->VATS   Cyst, breast 01/2012; 06/2012; 08/29/69; 09/01/27   Complicated cysts in upper outer left breast--no evidence of malignancy--f/u bilat diag mammo/left breast u/s on 03/06/15 showed resolution of left breast cysts.  Repeat screening mammogram 1 yr.   Empyema ([place])    drained by CT surgery/VATS.  Full recovery as of 12/2018 CT surgery f/u visit.   H/O allergic rhinitis    Dr. Ishmael Holter   Hepatic cyst 2019   Noted on CT chest imaging.  Multiple, small, "simple"   History of pneumonia 2009 and 2010   supplemential oxygen d/c'd 12/16/18 by pulm   Mild persistent asthma, well controlled 2014   New adult onset asthma after respiratory infection (Dr. Ishmael Holter)   Morbid obesity ([place])    BMI 50+.  At one point in time she was going to Bariatric clinic on [address] and got weekly HCG injections, monthly vit B12 injections, and took phentermine daily.  As of 05/2015 she was no longer doing this.   Nephrolithiasis 04/2018 first episode   Dr. [facility] Neighbours ([facility]): 1.17mm right UVJ stone + 6 mm L AML.  Pt elected for trial of passage.   OSA (obstructive sleep apnea) 10/2018   Mild/mod OSA 12/2018--pt not able to afford CPAP as of 01/2019.   Osteoarthritis of both knees    No improvement with steroid injections; did synvisc x 3 yrs; bilat replacement was recommended but she declined.   Prediabetes    she's on metformin   Pulmonary nodule 06/29/2021   on lung ca screening CT->repeat 6 mo   Recurrent low back pain     Past Surgical History:  Procedure  Laterality Date   APPENDECTOMY  1978   CARDIAC CATHETERIZATION  2010   Normal coronaries per pt   CARDIOVASCULAR STRESS TEST  2010   abnl per pt; f/u cath clean   CESAREAN SECTION     x 3   CHOLECYSTECTOMY  1986   COLONOSCOPY  2011   lipoma and small diminutive polyp in rectum (in [place])-->[facility] to do repeat as of 04/05/21   COLONOSCOPY  06/15/2021   Normal-recall 10 yrs   COLONOSCOPY WITH PROPOFOL N/A 06/15/2021   Procedure: COLONOSCOPY WITH PROPOFOL;  Surgeon: Otis Brace, MD;  [facility];  Service: Gastroenterology;  Laterality: N/A;   DEXA  06/2021   06/2021 NORMAL-> rpt 2 yrs.   EMPYEMA DRAINAGE Left 10/24/2018   Evacuation of empyema with decortication.  Procedure: EMPYEMA DRAINAGE;  Surgeon: Grace Isaac, MD;  Location: [facility];  Service: Thoracic;  Laterality: Left;   LUMBAR DISC SURGERY  1993   L5/S1   TONSILLECTOMY AND ADENOIDECTOMY  1967ish   TOTAL ABDOMINAL HYSTERECTOMY  1994   Ovaries are still in.  This was done for DUB/fibroids.   VIDEO ASSISTED THORACOSCOPY (VATS)/THOROCOTOMY Left 10/24/2018   Procedure: VIDEO ASSISTED THORACOSCOPY (VATS)/THOROCOTOMY with EVACUATION OF EMPYEMA AND DECORTICATION.;  Surgeon: Grace Isaac, MD;  Location: [facility];  Service: Thoracic;  Laterality: Left;   VIDEO BRONCHOSCOPY N/A 10/24/2018  Procedure: VIDEO BRONCHOSCOPY;  Surgeon: Grace Isaac, MD;  Location: [facility];  Service: Thoracic;  Laterality: N/A;    Family History  Problem Relation Age of Onset   Arthritis Mother    Heart disease Mother 30   Hypertension Mother    Diabetes Mother    Colon cancer Mother        65's   Arthritis Paternal Grandmother    Breast cancer Neg Hx     Social History   Socioeconomic History   Marital status: Married    Spouse name: Not on file   Number of children: 4   Years of education: Not on file   Highest education level: Not on file  Occupational History   Not on file  Tobacco Use   Smoking status: Former     Packs/day: 0.75    Years: 34.00    Pack years: 25.50    Types: Cigarettes    Start date: 08/1976    Quit date: 12/28/2009    Years since quitting: 12.0   Smokeless tobacco: Never  Vaping Use   Vaping Use: Never used  Substance and Sexual Activity   Alcohol use: Yes    Comment: social   Drug use: No   Sexual activity: Not on file  Other Topics Concern   Not on file  Social History Narrative   Married, 4 grown children.   Relocated to [place], [place] from [place] about 2012.   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   Exercise: intermittently does water aerobics..            Social Determinants of Health   Financial Resource Strain: Not on file  Food Insecurity: Not on file  Transportation Needs: Not on file  Physical Activity: Not on file  Stress: Not on file  Social Connections: Not on file  Intimate Partner Violence: Not on file    Outpatient Medications Prior to Visit  Medication Sig Dispense Refill   albuterol (PROVENTIL) (2.5 MG/3ML) 0.083% nebulizer solution Take 3 mLs (2.5 mg total) by nebulization every 6 (six) hours as needed for wheezing. 45 mL 1   albuterol (VENTOLIN HFA) 108 (90 Base) MCG/ACT inhaler Inhale 2 puffs into the lungs every 4 (four) hours as needed for wheezing. 6.7 g 3   Ascorbic Acid (VITAMIN C) 1000 MG tablet Take 1,000 mg by mouth daily.     beclomethasone (QVAR REDIHALER) 40 MCG/ACT inhaler Inhale 2 puffs into the lungs 2 (two) times daily. 10.6 g 2   cholecalciferol (VITAMIN D) 25 MCG (1000 UNIT) tablet Take 1,000 Units by mouth daily.     Fexofenadine HCl (ALLEGRA PO) Take by mouth daily.     glucose blood (CONTOUR NEXT TEST) test strip Use to check blood sugar once daily 100 each 11   glucose blood test strip Use to check blood sugar once daily. 100 each 12   Lancets (ONETOUCH ULTRASOFT) lancets Use to check blood sugar once daily. 100 each 12   magnesium gluconate  (MAGONATE) 500 MG tablet Take 500 mg by mouth daily.     metFORMIN (GLUCOPHAGE) 1000 MG tablet TAKE 1 TABLET (1,000 MG TOTAL) BY MOUTH 2 (TWO) TIMES DAILY WITH A MEAL. 180 tablet 0   Multiple Vitamin (MULTIVITAMIN) tablet Take 1 tablet by mouth daily.     OVER THE COUNTER MEDICATION daily. GOLO     Respiratory Therapy Supplies (NEBULIZER AIR TUBE/PLUGS) MISC Use with nebulizer  every 6(six) hours as needed for wheezing. 1 each 5   No facility-administered medications prior to visit.    Allergies  Allergen Reactions   Penicillins Nausea Only and Rash    Has patient had a PCN reaction causing immediate rash, facial/tongue/throat swelling, SOB or lightheadedness with hypotension: yES Has patient had a PCN reaction causing severe rash involving mucus membranes or skin necrosis: No Has patient had a PCN reaction that required hospitalization: No Has patient had a PCN reaction occurring within the last 10 years: No If all of the above answers are "NO", then may proceed with Cephalosporin use. CC    ROS Review of Systems  Constitutional:  Negative for appetite change, chills, fatigue and fever.  HENT:  Negative for congestion, dental problem, ear pain and sore throat.   Eyes:  Negative for discharge, redness and visual disturbance.  Respiratory:  Negative for cough, chest tightness, shortness of breath and wheezing.   Cardiovascular:  Negative for chest pain, palpitations and leg swelling.  Gastrointestinal:  Negative for abdominal pain, blood in stool, diarrhea, nausea and vomiting.  Genitourinary:  Negative for difficulty urinating, dysuria, flank pain, frequency, hematuria and urgency.  Musculoskeletal:  Negative for arthralgias, back pain, joint swelling, myalgias and neck stiffness.  Skin:  Negative for pallor and rash.  Neurological:  Negative for dizziness, speech difficulty, weakness and headaches.  Hematological:  Negative for adenopathy. Does not bruise/bleed easily.   Psychiatric/Behavioral:  Negative for confusion and sleep disturbance. The patient is not nervous/anxious.    PE; Vitals with BMI 12/28/2021 09/14/2021 06/15/2021  Height 5\' 7"  5\' 7"  -  Weight 341 lbs 342 lbs 6 oz -  BMI 54.6 27.03 -  Systolic 500 99 98  Diastolic 70 66 51  Pulse 64 70 59   Exam chaperoned by Kavin Leech, CMA.   Gen: Alert, well appearing.  Patient is oriented to person, place, time, and situation. AFFECT: pleasant, lucid thought and speech. ENT: Ears: EACs clear, normal epithelium.  TMs with good light reflex and landmarks bilaterally.  Eyes: no injection, icteris, swelling, or exudate.  EOMI, PERRLA. Nose: no drainage or turbinate edema/swelling.  No injection or focal lesion.  Mouth: lips without lesion/swelling.  Oral mucosa pink and moist.  Dentition intact and without obvious caries or gingival swelling.  Oropharynx without erythema, exudate, or swelling.  Neck: supple/nontender.  No LAD, mass, or TM.  Carotid pulses 2+ bilaterally, without bruits. CV: RRR, no m/r/g.   LUNGS: CTA bilat, nonlabored resps, good aeration in all lung fields. ABD: soft, NT, ND, BS normal.  No hepatospenomegaly or mass.  No bruits. EXT: no clubbing, cyanosis, or edema.  Musculoskeletal: no joint swelling, erythema, warmth, or tenderness.  ROM of all joints intact. Skin - no sores or suspicious lesions or rashes or color changes  Pertinent labs:  Lab Results  Component Value Date   TSH 1.15 02/08/2021   Lab Results  Component Value Date   WBC 8.4 02/08/2021   HGB 12.7 02/08/2021   HCT 39.2 02/08/2021   MCV 83.6 02/08/2021   PLT 273.0 02/08/2021   Lab Results  Component Value Date   CREATININE 0.85 02/08/2021   BUN 17 02/08/2021   NA 142 02/08/2021   K 4.6 02/08/2021   CL 106 02/08/2021   CO2 27 02/08/2021   Lab Results  Component Value Date   ALT 12 02/08/2021   AST 12 02/08/2021   ALKPHOS 67 02/08/2021   BILITOT 0.4 02/08/2021   Lab Results  Component Value  Date   CHOL 180 02/08/2021   Lab Results  Component Value Date   HDL 83.10 02/08/2021   Lab Results  Component Value Date   LDLCALC 78 02/08/2021   Lab Results  Component Value Date   TRIG 96.0 02/08/2021   Lab Results  Component Value Date   CHOLHDL 2 02/08/2021   Lab Results  Component Value Date   HGBA1C 5.7 (A) 09/14/2021   HGBA1C 5.7 09/14/2021   HGBA1C 5.7 09/14/2021   HGBA1C 5.7 09/14/2021   ASSESSMENT AND PLAN:   1) Borderline DM: Doing well on metformin 1000 twice daily. Hemoglobin A1c and fasting glucose today.  2) Lung nodule on screening CT 06/29/21-->repeat CT ordered today.  3) Health maintenance exam: Reviewed age and gender appropriate health maintenance issues (prudent diet, regular exercise, health risks of tobacco and excessive alcohol, use of seatbelts, fire alarms in home, use of sunscreen).  Also reviewed age and gender appropriate health screening as well as vaccine recommendations. Vaccines: all UTD. Labs: fasting HP ordered. Cervical ca screening: n/a-->pt with hysterectomy for benign dx. Breast ca screening: mammogram due around 02/08/21--ordered. Colon ca screening: recall 2032.  An After Visit Summary was printed and given to the patient.  FOLLOW UP:  No follow-ups on file.  Signed:  Crissie Sickles, MD           12/28/2021

## 2021-12-28 NOTE — Patient Instructions (Signed)

## 2022-01-07 ENCOUNTER — Inpatient Hospital Stay: Admission: RE | Admit: 2022-01-07 | Payer: Medicare Other | Source: Ambulatory Visit

## 2022-01-10 DIAGNOSIS — R0902 Hypoxemia: Secondary | ICD-10-CM | POA: Diagnosis not present

## 2022-01-10 DIAGNOSIS — G4733 Obstructive sleep apnea (adult) (pediatric): Secondary | ICD-10-CM | POA: Diagnosis not present

## 2022-01-10 DIAGNOSIS — J189 Pneumonia, unspecified organism: Secondary | ICD-10-CM | POA: Diagnosis not present

## 2022-01-10 DIAGNOSIS — J45909 Unspecified asthma, uncomplicated: Secondary | ICD-10-CM | POA: Diagnosis not present

## 2022-01-14 DIAGNOSIS — G4733 Obstructive sleep apnea (adult) (pediatric): Secondary | ICD-10-CM | POA: Diagnosis not present

## 2022-01-14 DIAGNOSIS — J45909 Unspecified asthma, uncomplicated: Secondary | ICD-10-CM | POA: Diagnosis not present

## 2022-01-16 DIAGNOSIS — J45909 Unspecified asthma, uncomplicated: Secondary | ICD-10-CM | POA: Diagnosis not present

## 2022-01-16 DIAGNOSIS — J189 Pneumonia, unspecified organism: Secondary | ICD-10-CM | POA: Diagnosis not present

## 2022-01-16 DIAGNOSIS — R0902 Hypoxemia: Secondary | ICD-10-CM | POA: Diagnosis not present

## 2022-01-16 DIAGNOSIS — G4733 Obstructive sleep apnea (adult) (pediatric): Secondary | ICD-10-CM | POA: Diagnosis not present

## 2022-02-09 ENCOUNTER — Other Ambulatory Visit: Payer: Self-pay

## 2022-02-09 ENCOUNTER — Ambulatory Visit (INDEPENDENT_AMBULATORY_CARE_PROVIDER_SITE_OTHER)
Admission: RE | Admit: 2022-02-09 | Discharge: 2022-02-09 | Disposition: A | Discharge status: Home / Self Care | Payer: Medicare Other | Source: Ambulatory Visit | Attending: Pulmonary Disease | Admitting: Pulmonary Disease

## 2022-02-09 ENCOUNTER — Ambulatory Visit
Admission: RE | Admit: 2022-02-09 | Discharge: 2022-02-09 | Disposition: A | Discharge status: Home / Self Care | Payer: Medicare Other | Source: Ambulatory Visit | Attending: Family Medicine | Admitting: Family Medicine

## 2022-02-09 DIAGNOSIS — R911 Solitary pulmonary nodule: Secondary | ICD-10-CM | POA: Diagnosis not present

## 2022-02-09 DIAGNOSIS — Z1231 Encounter for screening mammogram for malignant neoplasm of breast: Secondary | ICD-10-CM

## 2022-02-09 DIAGNOSIS — R918 Other nonspecific abnormal finding of lung field: Secondary | ICD-10-CM | POA: Diagnosis not present

## 2022-02-10 DIAGNOSIS — J45909 Unspecified asthma, uncomplicated: Secondary | ICD-10-CM | POA: Diagnosis not present

## 2022-02-10 DIAGNOSIS — R0902 Hypoxemia: Secondary | ICD-10-CM | POA: Diagnosis not present

## 2022-02-10 DIAGNOSIS — J189 Pneumonia, unspecified organism: Secondary | ICD-10-CM | POA: Diagnosis not present

## 2022-02-10 DIAGNOSIS — G4733 Obstructive sleep apnea (adult) (pediatric): Secondary | ICD-10-CM | POA: Diagnosis not present

## 2022-02-14 DIAGNOSIS — G4733 Obstructive sleep apnea (adult) (pediatric): Secondary | ICD-10-CM | POA: Diagnosis not present

## 2022-02-14 DIAGNOSIS — J45909 Unspecified asthma, uncomplicated: Secondary | ICD-10-CM | POA: Diagnosis not present

## 2022-02-16 ENCOUNTER — Encounter: Payer: Self-pay | Admitting: Pulmonary Disease

## 2022-02-16 ENCOUNTER — Other Ambulatory Visit: Payer: Self-pay

## 2022-02-16 ENCOUNTER — Ambulatory Visit: Payer: Medicare Other | Admitting: Pulmonary Disease

## 2022-02-16 DIAGNOSIS — J45991 Cough variant asthma: Secondary | ICD-10-CM | POA: Diagnosis not present

## 2022-02-16 DIAGNOSIS — R911 Solitary pulmonary nodule: Secondary | ICD-10-CM | POA: Diagnosis not present

## 2022-02-16 DIAGNOSIS — G4733 Obstructive sleep apnea (adult) (pediatric): Secondary | ICD-10-CM | POA: Diagnosis not present

## 2022-02-16 DIAGNOSIS — Z9189 Other specified personal risk factors, not elsewhere classified: Secondary | ICD-10-CM | POA: Diagnosis not present

## 2022-02-16 NOTE — Assessment & Plan Note (Signed)
Appears stable since 2019 and benign, does have an airway leading to it so possible carcinoid. She will continue annual low-dose CT screening. New finding of thyroid nodule which can be followed up by PCP with an ultrasound or on serial CTs

## 2022-02-16 NOTE — Assessment & Plan Note (Signed)
CPAP download was reviewed which shows good control of events on auto CPAP 5 to 15 cm with average pressure of 11 and maximum pressure of 12.5 cm, few missed nights when she was traveling, average usage is about 5 hours per night. CPAP is only helped improve her daytime somnolence and fatigue  We will continue current settings, supplies will be renewed for a year, emphasized compliance up to 6 hours per night Weight loss encouraged, compliance with goal of at least 4-6 hrs every night is the expectation. Advised against medications with sedative side effects Cautioned against driving when sleepy - understanding that sleepiness will vary on a day to day basis

## 2022-02-16 NOTE — Patient Instructions (Addendum)
°  OK to stay off qvar & use allegra only during spring/ fall   CPAP works well when used , try to use at least 5-6 h every night

## 2022-02-16 NOTE — Progress Notes (Signed)
° °  Subjective:    Patient ID: Taylor Leon, female    DOB: 09/13/1956, 66 y.o.   MRN: [id number]  HPI  66 yo morbidly obese woman for FU of OSA & chronic cough   She reports a chronic cough and intermittent wheezing since she moved from [place] to [place] in 2012.  She underwent ENT evaluation which was negative.  She saw Dr. Ishmael Holter allergist and was diagnosed with "allergy induced asthma"  Last flareup was in 08/2018.  She currently uses Qvar during spring and fall.   She was admitted 10/19/2018 for pneumonia and parapneumonic effusion and underwent VATS thoracotomy   Annual follow-up visit. She had a repeat home sleep test that showed mild to moderate OSA and was placed on CPAP, she got this a few months ago, had difficulties with the mask and underwent mask desensitization in 10/2021.  Now she has settled down with a nasal mask. She still struggles with the machine and takes it off when she wakes up in the morning around 5 AM but feels better rested in the daytime.  We discussed findings on follow-up CT scan  Asthma symptoms have been well controlled, she has not had a flareup in years.  Has not needed albuterol MDI, wonders if she still needs to take Allegra  Reviewed GI consultation and PCP notes  Significant tests/ events reviewed  11/2018 NPSG -  Wt 328 lbs  -mod OSA -RDI 21/h, AHI 9/h 04/2021 HST AHI 14/h   PFTs 01/2019 nml  09/2018 CT angiogram chest left lower lobe pneumonia.  06/2021 LDCT chest-RADS 3, 7 mm nodule right middle lobe  01/2022 CT chest without contrast stable nodule, left thyroid nodule  Review of Systems neg for any significant sore throat, dysphagia, itching, sneezing, nasal congestion or excess/ purulent secretions, fever, chills, sweats, unintended wt loss, pleuritic or exertional cp, hempoptysis, orthopnea pnd or change in chronic leg swelling. Also denies presyncope, palpitations, heartburn, abdominal pain, nausea, vomiting, diarrhea or change in  bowel or urinary habits, dysuria,hematuria, rash, arthralgias, visual complaints, headache, numbness weakness or ataxia.     Objective:   Physical Exam   Gen. Pleasant, obese, in no distress ENT - no lesions, no post nasal drip Neck: No JVD, no thyromegaly, no carotid bruits Lungs: no use of accessory muscles, no dullness to percussion, decreased without rales or rhonchi  Cardiovascular: Rhythm regular, heart sounds  normal, no murmurs or gallops, no peripheral edema Musculoskeletal: No deformities, no cyanosis or clubbing , no tremors       Assessment & Plan:

## 2022-02-16 NOTE — Assessment & Plan Note (Signed)
Appears mild intermittent.  Symptoms appear to be well controlled She has not had a flareup in at least 4 years and last flareup was likely related to viral bronchitis. I have asked her to discontinue Qvar and use Allegra only during spring and fall. She will monitor symptoms and call us if she has flareup, we discussed signs and symptoms

## 2022-03-01 ENCOUNTER — Other Ambulatory Visit: Payer: Self-pay | Admitting: Family Medicine

## 2022-03-01 DIAGNOSIS — R7303 Prediabetes: Secondary | ICD-10-CM

## 2022-03-14 DIAGNOSIS — G4733 Obstructive sleep apnea (adult) (pediatric): Secondary | ICD-10-CM | POA: Diagnosis not present

## 2022-03-14 DIAGNOSIS — J45909 Unspecified asthma, uncomplicated: Secondary | ICD-10-CM | POA: Diagnosis not present

## 2022-03-26 DIAGNOSIS — E041 Nontoxic single thyroid nodule: Secondary | ICD-10-CM

## 2022-03-26 HISTORY — DX: Nontoxic single thyroid nodule: E04.1

## 2022-04-05 ENCOUNTER — Ambulatory Visit (INDEPENDENT_AMBULATORY_CARE_PROVIDER_SITE_OTHER): Payer: Medicare Other | Admitting: Family Medicine

## 2022-04-05 ENCOUNTER — Encounter: Payer: Self-pay | Admitting: Family Medicine

## 2022-04-05 VITALS — BP 97/61 | HR 62 | Temp 98.1°F | Wt 335.4 lb

## 2022-04-05 DIAGNOSIS — E041 Nontoxic single thyroid nodule: Secondary | ICD-10-CM

## 2022-04-05 DIAGNOSIS — R7303 Prediabetes: Secondary | ICD-10-CM | POA: Diagnosis not present

## 2022-04-05 LAB — POCT GLYCOSYLATED HEMOGLOBIN (HGB A1C)
HbA1c POC (<> result, manual entry): 5.8 % (ref 4.0–5.6)
HbA1c, POC (controlled diabetic range): 5.8 % (ref 0.0–7.0)
HbA1c, POC (prediabetic range): 5.8 % (ref 5.7–6.4)
Hemoglobin A1C: 5.8 % — AB (ref 4.0–5.6)

## 2022-04-05 NOTE — Progress Notes (Signed)
OFFICE VISIT ? ?04/05/2022 ? ?CC:  ?Chief Complaint  ?Patient presents with  ? Diabetes  ?  Pt is fasting  ? ?Patient is a 66 y.o. female who presents for 3 mo f/u borderline DM. ?A/P as of last visit: ?"1) Borderline DM: Doing well on metformin 1000 twice daily. ?Hemoglobin A1c and fasting glucose today. ?  ?2) Lung nodule on screening CT 06/29/21-->repeat CT ordered today. ?  ?3) Health maintenance exam: ?Reviewed age and gender appropriate health maintenance issues (prudent diet, regular exercise, health risks of tobacco and excessive alcohol, use of seatbelts, fire alarms in home, use of sunscreen).  Also reviewed age and gender appropriate health screening as well as vaccine recommendations. ?Vaccines: all UTD. ?Labs: fasting HP ordered. ?Cervical ca screening: n/a-->pt with hysterectomy for benign dx. ?Breast ca screening: mammogram due around 02/08/21--ordered. ?Colon ca screening: recall 2032." ? ?INTERIM HX: ?Taylor Leon is feeling well.  She has had some extra stress lately due to her daughter having a aneurysm at age 87.  She is been traveling back and forth to [place] a lot, helping care for her grandchildren. ? ?Says home glucoses in the 1 10-1 15 range fasting. ? ?Her follow-up chest CT to monitor lung nodule showed stability of the nodule since 2019 and her pulmonologist, Dr. Elsworth Soho, recommended she continue annual low-dose CT screening. ?Incidentally noted:  9 mm hypodense nodule in the left thyroid lobe. ? ?Past Medical History:  ?Diagnosis Date  ? BPPV (benign paroxysmal positional vertigo) 01/30/2014  ? CAP (community acquired pneumonia) 10/2018  ? with acute hypoxic RF, empyema-->VATS  ? Cyst, breast 01/2012; 06/2012; 02/26/13; 03/03/14  ? Complicated cysts in upper outer left breast--no evidence of malignancy--f/u bilat diag mammo/left breast u/s on 03/06/15 showed resolution of left breast cysts.  Repeat screening mammogram 1 yr.  ? Empyema ([place])   ? drained by CT surgery/VATS.  Full recovery as of 12/2018 CT  surgery f/u visit.  ? H/O allergic rhinitis   ? Dr. Ishmael Holter  ? Hepatic cyst 2019  ? Noted on CT chest imaging.  Multiple, small, "simple"  ? History of pneumonia 2009 and 2010  ? supplemential oxygen d/c'd 12/16/18 by pulm  ? Mild persistent asthma, well controlled 2014  ? New adult onset asthma after respiratory infection (Dr. Ishmael Holter)  ? Morbid obesity ([place])   ? BMI 50+.  At one point in time she was going to Bariatric clinic on [address] and got weekly HCG injections, monthly vit B12 injections, and took phentermine daily.  As of 05/2015 she was no longer doing this.  ? Nephrolithiasis 04/2018 first episode  ? Dr. Lovena Neighbours ([facility]): 1.48m right UVJ stone + 6 mm L AML.  Pt elected for trial of passage.  ? OSA (obstructive sleep apnea) 10/2018  ? Mild/mod OSA 12/2018--pt not able to afford CPAP as of 01/2019.  ? Osteoarthritis of both knees   ? No improvement with steroid injections; did synvisc x 3 yrs; bilat replacement was recommended but she declined.  ? Prediabetes   ? she's on metformin  ? Pulmonary nodule 06/29/2021  ? on lung ca screening CT->repeat 6 mo  ? Recurrent low back pain   ? ? ?Past Surgical History:  ?Procedure Laterality Date  ? APPENDECTOMY  1978  ? CARDIAC CATHETERIZATION  2010  ? Normal coronaries per pt  ? CARDIOVASCULAR STRESS TEST  2010  ? abnl per pt; f/u cath clean  ? CESAREAN SECTION    ? x 3  ? CHOLECYSTECTOMY  1986  ? COLONOSCOPY  2011  ? lipoma and small diminutive polyp in rectum (in [place])-->[facility] to do repeat as of 04/05/21  ? COLONOSCOPY  06/15/2021  ? Normal-recall 10 yrs  ? COLONOSCOPY WITH PROPOFOL N/A 06/15/2021  ? Procedure: COLONOSCOPY WITH PROPOFOL;  Surgeon: Otis Brace, MD;  [facility];  Service: Gastroenterology;  Laterality: N/A;  ? DEXA  06/2021  ? 06/2021 NORMAL-> rpt 2 yrs.  ? EMPYEMA DRAINAGE Left 10/24/2018  ? Evacuation of empyema with decortication.  Procedure: EMPYEMA DRAINAGE;  Surgeon: Grace Isaac, MD;  Location: [facility];  Service:  Thoracic;  Laterality: Left;  ? [place]  ? L5/S1  ? TONSILLECTOMY AND ADENOIDECTOMY  1967ish  ? TOTAL ABDOMINAL HYSTERECTOMY  1994  ? Ovaries are still in.  This was done for DUB/fibroids.  ? VIDEO ASSISTED THORACOSCOPY (VATS)/THOROCOTOMY Left 10/24/2018  ? Procedure: VIDEO ASSISTED THORACOSCOPY (VATS)/THOROCOTOMY with EVACUATION OF EMPYEMA AND DECORTICATION.;  Surgeon: Grace Isaac, MD;  Location: [facility];  Service: Thoracic;  Laterality: Left;  ? VIDEO BRONCHOSCOPY N/A 10/24/2018  ? Procedure: VIDEO BRONCHOSCOPY;  Surgeon: Grace Isaac, MD;  Location: [facility];  Service: Thoracic;  Laterality: N/A;  ? ? ?Outpatient Medications Prior to Visit  ?Medication Sig Dispense Refill  ? Ascorbic Acid (VITAMIN C) 1000 MG tablet Take 1,000 mg by mouth daily.    ? cholecalciferol (VITAMIN D) 25 MCG (1000 UNIT) tablet Take 1,000 Units by mouth daily.    ? Fexofenadine HCl (ALLEGRA PO) Take by mouth daily.    ? glucose blood (CONTOUR NEXT TEST) test strip Use to check blood sugar once daily 100 each 11  ? Lancets (ONETOUCH DELICA PLUS [id number]) MISC USE TO CHECK BLOOD SUGAR ONCE DAILY 100 each 12  ? magnesium gluconate (MAGONATE) 500 MG tablet Take 500 mg by mouth daily.    ? metFORMIN (GLUCOPHAGE) 1000 MG tablet Take 1 tablet (1,000 mg total) by mouth 2 (two) times daily with a meal. 180 tablet 3  ? Multiple Vitamin (MULTIVITAMIN) tablet Take 1 tablet by mouth daily.    ? ONETOUCH ULTRA test strip USE TO CHECK BLOOD SUGAR ONCE DAILY 100 strip 12  ? OVER THE COUNTER MEDICATION daily. GOLO    ? albuterol (PROVENTIL) (2.5 MG/3ML) 0.083% nebulizer solution Take 3 mLs (2.5 mg total) by nebulization every 6 (six) hours as needed for wheezing. (Patient not taking: Reported on 04/05/2022) 45 mL 1  ? albuterol (VENTOLIN HFA) 108 (90 Base) MCG/ACT inhaler Inhale 2 puffs into the lungs every 4 (four) hours as needed for wheezing. (Patient not taking: Reported on 04/05/2022) 6.7 g 3  ? beclomethasone (QVAR  REDIHALER) 40 MCG/ACT inhaler Inhale 2 puffs into the lungs 2 (two) times daily. (Patient not taking: Reported on 04/05/2022) 10.6 g 2  ? Respiratory Therapy Supplies (NEBULIZER AIR TUBE/PLUGS) MISC Use with nebulizer every 6(six) hours as needed for wheezing. (Patient not taking: Reported on 04/05/2022) 1 each 5  ? ?No facility-administered medications prior to visit.  ? ? ?Allergies  ?Allergen Reactions  ? Penicillins Nausea Only and Rash  ?  Has patient had a PCN reaction causing immediate rash, facial/tongue/throat swelling, SOB or lightheadedness with hypotension: yES ?Has patient had a PCN reaction causing severe rash involving mucus membranes or skin necrosis: No ?Has patient had a PCN reaction that required hospitalization: No ?Has patient had a PCN reaction occurring within the last 10 years: No ?If all of the above answers are "NO", then may  proceed with Cephalosporin use. ?CC  ? ? ?ROS ?As per HPI ? ?PE: ? ?  04/05/2022  ?  8:58 AM 02/16/2022  ? 11:20 AM 12/28/2021  ?  8:49 AM  ?Vitals with BMI  ?Height  '5\' 7"'$  '5\' 7"'$   ?Weight 335 lbs 6 oz 331 lbs 13 oz 341 lbs  ?BMI  51.95 53.4  ?Systolic 97 161 096  ?Diastolic 61 60 70  ?Pulse 62 74 64  ? ? ? ?Physical Exam ? ?General: Alert and well-appearing. ?Affect: Pleasant, lucid thought and speech. ?No further exam today. ? ?LABS:  ?Last CBC ?Lab Results  ?Component Value Date  ? WBC 7.7 12/28/2021  ? HGB 12.7 12/28/2021  ? HCT 40.4 12/28/2021  ? MCV 85.9 12/28/2021  ? MCH 26.3 10/31/2018  ? RDW 16.0 (H) 12/28/2021  ? PLT 274.0 12/28/2021  ? ?Last metabolic panel ?Lab Results  ?Component Value Date  ? GLUCOSE 100 (H) 12/28/2021  ? NA 142 12/28/2021  ? K 4.5 12/28/2021  ? CL 106 12/28/2021  ? CO2 29 12/28/2021  ? BUN 15 12/28/2021  ? CREATININE 0.87 12/28/2021  ? GFRNONAA >60 10/30/2018  ? CALCIUM 9.5 12/28/2021  ? PHOS 2.8 10/26/2018  ? PROT 6.6 12/28/2021  ? ALBUMIN 3.8 12/28/2021  ? BILITOT 0.5 12/28/2021  ? ALKPHOS 63 12/28/2021  ? AST 15 12/28/2021  ? ALT 13  12/28/2021  ? ANIONGAP 7 10/30/2018  ? ?Lab Results  ?Component Value Date  ? CHOL 189 12/28/2021  ? HDL 80.30 12/28/2021  ? [place] 84 12/28/2021  ? TRIG 120.0 12/28/2021  ? CHOLHDL 2 12/28/2021  ? ?Lab Results  ?Component Val

## 2022-04-11 ENCOUNTER — Ambulatory Visit (HOSPITAL_BASED_OUTPATIENT_CLINIC_OR_DEPARTMENT_OTHER)
Admission: RE | Admit: 2022-04-11 | Discharge: 2022-04-11 | Disposition: A | Discharge status: Home / Self Care | Payer: Medicare Other | Source: Ambulatory Visit | Attending: Family Medicine | Admitting: Family Medicine

## 2022-04-11 DIAGNOSIS — E041 Nontoxic single thyroid nodule: Secondary | ICD-10-CM | POA: Insufficient documentation

## 2022-04-12 ENCOUNTER — Encounter: Payer: Self-pay | Admitting: Family Medicine

## 2022-04-26 ENCOUNTER — Encounter: Payer: Self-pay | Admitting: Family Medicine

## 2022-04-26 ENCOUNTER — Ambulatory Visit (INDEPENDENT_AMBULATORY_CARE_PROVIDER_SITE_OTHER): Payer: Medicare Other | Admitting: Family Medicine

## 2022-04-26 VITALS — BP 106/56 | HR 67 | Temp 98.1°F | Ht 67.0 in | Wt 336.0 lb

## 2022-04-26 DIAGNOSIS — R051 Acute cough: Secondary | ICD-10-CM | POA: Diagnosis not present

## 2022-04-26 DIAGNOSIS — J329 Chronic sinusitis, unspecified: Secondary | ICD-10-CM | POA: Diagnosis not present

## 2022-04-26 DIAGNOSIS — B9689 Other specified bacterial agents as the cause of diseases classified elsewhere: Secondary | ICD-10-CM | POA: Diagnosis not present

## 2022-04-26 LAB — POCT RAPID STREP A (OFFICE): Rapid Strep A Screen: NEGATIVE

## 2022-04-26 LAB — POC COVID19 BINAXNOW: SARS Coronavirus 2 Ag: NEGATIVE

## 2022-04-26 MED ORDER — BENZONATATE 200 MG PO CAPS: 200.0000 mg | ORAL_CAPSULE | Freq: Two times a day (BID) | ORAL | 0 refills | Status: DC | PRN

## 2022-04-26 MED ORDER — PREDNISONE 20 MG PO TABS: 40.0000 mg | ORAL_TABLET | Freq: Every day | ORAL | 0 refills | Status: DC

## 2022-04-26 MED ORDER — DOXYCYCLINE HYCLATE 100 MG PO TABS: 100.0000 mg | ORAL_TABLET | Freq: Two times a day (BID) | ORAL | 0 refills | Status: DC

## 2022-04-26 NOTE — Progress Notes (Signed)
? ? ? ? ? ?Taylor Leon , 1956-12-23, 66 y.o., female ?MRN: [id number] ?Patient Care Team  ?  Relationship Specialty Notifications Start End  ?Tammi Sou, MD PCP - General Family Medicine  03/28/12   ?Gean Quint, MD (Inactive) Consulting Physician Allergy  01/16/13   ?Ceasar Mons, MD Consulting Physician Urology  05/08/18   ?Rigoberto Noel, MD Consulting Physician Pulmonary Disease  12/30/18   ?Otis Brace, MD Consulting Physician Gastroenterology  04/21/21   ? Comment: [facility]  ? ? ?Chief Complaint  ?Patient presents with  ? Cough  ?  Pt c/o chest and nasal congestion, productive cough with greenish tint, sore throat, post nasal drip x 2 days  ? ?  ?Subjective: Pt presents for an OV with complaints of productive cough, chest congestion, sore throat and PND of 2 days duration. She is vaccinated against covid and flu.  ?She has not been around any sick contacts that she is aware.  ?She is allergic to PCN.  ?PMH: OSA, AR, asthmatic bronchitis. ?Inhaler compliance: no longer taking per pulm instructions.  ? ?  12/28/2021  ?  8:50 AM 07/31/2020  ?  8:36 AM 01/25/2019  ?  8:37 AM 11/05/2018  ?  3:27 PM 03/14/2018  ?  9:43 AM  ?Depression screen PHQ 2/9  ?Decreased Interest 0 0 0 0 0  ?Down, Depressed, Hopeless 0 0 0 0 0  ?PHQ - 2 Score 0 0 0 0 0  ? ? ?Allergies  ?Allergen Reactions  ? Penicillins Nausea Only and Rash  ?  Has patient had a PCN reaction causing immediate rash, facial/tongue/throat swelling, SOB or lightheadedness with hypotension: yES ?Has patient had a PCN reaction causing severe rash involving mucus membranes or skin necrosis: No ?Has patient had a PCN reaction that required hospitalization: No ?Has patient had a PCN reaction occurring within the last 10 years: No ?If all of the above answers are "NO", then may proceed with Cephalosporin use. ?CC  ? ?Social History  ? ?Social History Narrative  ? Married, 4 grown children.  ? Relocated to [place], [place] from [place] about 2012.  ?  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  ? Former smoker: 51 pack-yr hx, quit 2011.  ? Exercise: intermittently does water aerobics..  ?   ?   ?   ? ?Past Medical History:  ?Diagnosis Date  ? BPPV (benign paroxysmal positional vertigo) 01/30/2014  ? CAP (community acquired pneumonia) 10/2018  ? with acute hypoxic RF, empyema-->VATS  ? Cyst, breast 01/2012; 06/2012; 02/26/13; 03/03/14  ? Complicated cysts in upper outer left breast--no evidence of malignancy--f/u bilat diag mammo/left breast u/s on 03/06/15 showed resolution of left breast cysts.  Repeat screening mammogram 1 yr.  ? Empyema ([place])   ? drained by CT surgery/VATS.  Full recovery as of 12/2018 CT surgery f/u visit.  ? H/O allergic rhinitis   ? Dr. Ishmael Holter  ? Hepatic cyst 2019  ? Noted on CT chest imaging.  Multiple, small, "simple"  ? History of pneumonia 2009 and 2010  ? supplemential oxygen d/c'd 12/16/18 by pulm  ? Mild persistent asthma, well controlled 2014  ? New adult onset asthma after respiratory infection (Dr. Ishmael Holter)  ? Morbid obesity ([place])   ? BMI 50+.  At one point in time she was going to Bariatric clinic on [address] and got weekly HCG injections, monthly vit B12 injections, and took phentermine daily.  As of 05/2015 she was  no longer doing this.  ? Nephrolithiasis 04/2018 first episode  ? Dr. Lovena Neighbours ([facility]): 1.32m right UVJ stone + 6 mm L AML.  Pt elected for trial of passage.  ? OSA (obstructive sleep apnea) 10/2018  ? Mild/mod OSA 12/2018--pt not able to afford CPAP as of 01/2019.  ? Osteoarthritis of both knees   ? No improvement with steroid injections; did synvisc x 3 yrs; bilat replacement was recommended but she declined.  ? Prediabetes   ? she's on metformin  ? Pulmonary nodule 06/29/2021  ? on lung ca screening CT->repeat 6 mo  ? Recurrent low back pain   ? Thyroid nodule 03/2022  ? 1 on each side, needs rpt u/s 03/2023  ? ?Past Surgical History:  ?Procedure Laterality Date  ? APPENDECTOMY  1978  ? CARDIAC  CATHETERIZATION  2010  ? Normal coronaries per pt  ? CARDIOVASCULAR STRESS TEST  2010  ? abnl per pt; f/u cath clean  ? CESAREAN SECTION    ? x 3  ? CHOLECYSTECTOMY  1986  ? COLONOSCOPY  2011  ? lipoma and small diminutive polyp in rectum (in [place])-->[facility] to do repeat as of 04/05/21  ? COLONOSCOPY  06/15/2021  ? Normal-recall 10 yrs  ? COLONOSCOPY WITH PROPOFOL N/A 06/15/2021  ? Procedure: COLONOSCOPY WITH PROPOFOL;  Surgeon: BOtis Brace MD;  Location: [facility];  Service: Gastroenterology;  Laterality: N/A;  ? DEXA  06/2021  ? 06/2021 NORMAL-> rpt 2 yrs.  ? EMPYEMA DRAINAGE Left 10/24/2018  ? Evacuation of empyema with decortication.  Procedure: EMPYEMA DRAINAGE;  Surgeon: GGrace Isaac MD;  Location: [facility]  Service: Thoracic;  Laterality: Left;  ? [place] ? L5/S1  ? TONSILLECTOMY AND ADENOIDECTOMY  1967ish  ? TOTAL ABDOMINAL HYSTERECTOMY  1994  ? Ovaries are still in.  This was done for DUB/fibroids.  ? VIDEO ASSISTED THORACOSCOPY (VATS)/THOROCOTOMY Left 10/24/2018  ? Procedure: VIDEO ASSISTED THORACOSCOPY (VATS)/THOROCOTOMY with EVACUATION OF EMPYEMA AND DECORTICATION.;  Surgeon: GGrace Isaac MD;  [facility]  Service: Thoracic;  Laterality: Left;  ? VIDEO BRONCHOSCOPY N/A 10/24/2018  ? Procedure: VIDEO BRONCHOSCOPY;  Surgeon: GGrace Isaac MD;  [facility]  Service: Thoracic;  Laterality: N/A;  ? ?Family History  ?Problem Relation Age of Onset  ? Arthritis Mother   ? Heart disease Mother 660 ? Hypertension Mother   ? Diabetes Mother   ? Colon cancer Mother   ?     654's ? Arthritis Paternal Grandmother   ? Breast cancer Neg Hx   ? ?Allergies as of 04/26/2022   ? ?   Reactions  ? Penicillins Nausea Only, Rash  ? Has patient had a PCN reaction causing immediate rash, facial/tongue/throat swelling, SOB or lightheadedness with hypotension: yES ?Has patient had a PCN reaction causing severe rash involving mucus membranes or skin necrosis: No ?Has patient had a  PCN reaction that required hospitalization: No ?Has patient had a PCN reaction occurring within the last 10 years: No ?If all of the above answers are "NO", then may proceed with Cephalosporin use. ?CC  ? ?  ? ?  ?Medication List  ?  ? ?  ? Accurate as of Apr 26, 2022  3:11 PM. If you have any questions, ask your nurse or doctor.  ?  ?  ? ?  ? ?albuterol (2.5 MG/3ML) 0.083% nebulizer solution ?Commonly known as: PROVENTIL ?Take 3 mLs (2.5 mg total) by nebulization every 6 (six)  hours as needed for wheezing. ?  ?albuterol 108 (90 Base) MCG/ACT inhaler ?Commonly known as: Ventolin HFA ?Inhale 2 puffs into the lungs every 4 (four) hours as needed for wheezing. ?  ?ALLEGRA PO ?Take by mouth daily. ?  ?benzonatate 200 MG capsule ?Commonly known as: TESSALON ?Take 1 capsule (200 mg total) by mouth 2 (two) times daily as needed for cough. ?Started by: Howard Pouch, DO ?  ?cholecalciferol 25 MCG (1000 UNIT) tablet ?Commonly known as: VITAMIN D ?Take 1,000 Units by mouth daily. ?  ?doxycycline 100 MG tablet ?Commonly known as: VIBRA-TABS ?Take 1 tablet (100 mg total) by mouth 2 (two) times daily. ?Started by: Howard Pouch, DO ?  ?glucose blood test strip ?Commonly known as: Contour Next Test ?Use to check blood sugar once daily ?  ?OneTouch Ultra test strip ?Generic drug: glucose blood ?USE TO CHECK BLOOD SUGAR ONCE DAILY ?  ?magnesium gluconate 500 MG tablet ?Commonly known as: MAGONATE ?Take 500 mg by mouth daily. ?  ?metFORMIN 1000 MG tablet ?Commonly known as: GLUCOPHAGE ?Take 1 tablet (1,000 mg total) by mouth 2 (two) times daily with a meal. ?  ?multivitamin tablet ?Take 1 tablet by mouth daily. ?  ?Physiological scientist Tube/Plugs Misc ?Use with nebulizer every 6(six) hours as needed for wheezing. ?  ?OneTouch Delica Plus [id number] Misc ?USE TO CHECK BLOOD SUGAR ONCE DAILY ?  ?OVER THE COUNTER MEDICATION ?daily. GOLO ?  ?predniSONE 20 MG tablet ?Commonly known as: DELTASONE ?Take 2 tablets (40 mg total) by mouth daily with  breakfast. ?Started by: Howard Pouch, DO ?  ?Qvar RediHaler 40 MCG/ACT inhaler ?Generic drug: beclomethasone ?Inhale 2 puffs into the lungs 2 (two) times daily. ?  ?vitamin C 1000 MG tablet ?Take 1,000 mg by mout

## 2022-04-26 NOTE — Patient Instructions (Signed)
?Medications called into your pharmacy.  ? ?Sinus Infection, Adult ?A sinus infection, also called sinusitis, is inflammation of your sinuses. Sinuses are hollow spaces in the bones around your face. Your sinuses are located: ?Around your eyes. ?In the middle of your forehead. ?Behind your nose. ?In your cheekbones. ?Mucus normally drains out of your sinuses. When your nasal tissues become inflamed or swollen, mucus can become trapped or blocked. This allows bacteria, viruses, and fungi to grow, which leads to infection. Most infections of the sinuses are caused by a virus. ?A sinus infection can develop quickly. It can last for up to 4 weeks (acute) or for more than 12 weeks (chronic). A sinus infection often develops after a cold. ?What are the causes? ?This condition is caused by anything that creates swelling in the sinuses or stops mucus from draining. This includes: ?Allergies. ?Asthma. ?Infection from bacteria or viruses. ?Deformities or blockages in your nose or sinuses. ?Abnormal growths in the nose (nasal polyps). ?Pollutants, such as chemicals or irritants in the air. ?Infection from fungi. This is rare. ?What increases the risk? ?You are more likely to develop this condition if you: ?Have a weak body defense system (immune system). ?Do a lot of swimming or diving. ?Overuse nasal sprays. ?Smoke. ?What are the signs or symptoms? ?The main symptoms of this condition are pain and a feeling of pressure around the affected sinuses. Other symptoms include: ?Stuffy nose or congestion that makes it difficult to breathe through your nose. ?Thick yellow or greenish drainage from your nose. ?Tenderness, swelling, and warmth over the affected sinuses. ?A cough that may get worse at night. ?Decreased sense of smell and taste. ?Extra mucus that collects in the throat or the back of the nose (postnasal drip) causing a sore throat or bad breath. ?Tiredness (fatigue). ?Fever. ?How is this diagnosed? ?This condition is  diagnosed based on: ?Your symptoms. ?Your medical history. ?A physical exam. ?Tests to find out if your condition is acute or chronic. This may include: ?Checking your nose for nasal polyps. ?Viewing your sinuses using a device that has a light (endoscope). ?Testing for allergies or bacteria. ?Imaging tests, such as an MRI or CT scan. ?In rare cases, a bone biopsy may be done to rule out more serious types of fungal sinus disease. ?How is this treated? ?Treatment for a sinus infection depends on the cause and whether your condition is chronic or acute. ?If caused by a virus, your symptoms should go away on their own within 10 days. You may be given medicines to relieve symptoms. They include: ?Medicines that shrink swollen nasal passages (decongestants). ?A spray that eases inflammation of the nostrils (topical intranasal corticosteroids). ?Rinses that help get rid of thick mucus in your nose (nasal saline washes). ?Medicines that treat allergies (antihistamines). ?Over-the-counter pain relievers. ?If caused by bacteria, your health care provider may recommend waiting to see if your symptoms improve. Most bacterial infections will get better without antibiotic medicine. You may be given antibiotics if you have: ?A severe infection. ?A weak immune system. ?If caused by narrow nasal passages or nasal polyps, surgery may be needed. ?Follow these instructions at home: ?Medicines ?Take, use, or apply over-the-counter and prescription medicines only as told by your health care provider. These may include nasal sprays. ?If you were prescribed an antibiotic medicine, take it as told by your health care provider. Do not stop taking the antibiotic even if you start to feel better. ?Hydrate and humidify ? ?Drink enough fluid to keep your  urine pale yellow. Staying hydrated will help to thin your mucus. ?Use a cool mist humidifier to keep the humidity level in your home above 50%. ?Inhale steam for 10-15 minutes, 3-4 times a  day, or as told by your health care provider. You can do this in the bathroom while a hot shower is running. ?Limit your exposure to cool or dry air. ?Rest ?Rest as much as possible. ?Sleep with your head raised (elevated). ?Make sure you get enough sleep each night. ?General instructions ? ?Apply a warm, moist washcloth to your face 3-4 times a day or as told by your health care provider. This will help with discomfort. ?Use nasal saline washes as often as told by your health care provider. ?Wash your hands often with soap and water to reduce your exposure to germs. If soap and water are not available, use hand sanitizer. ?Do not smoke. Avoid being around people who are smoking (secondhand smoke). ?Keep all follow-up visits. This is important. ?Contact a health care provider if: ?You have a fever. ?Your symptoms get worse. ?Your symptoms do not improve within 10 days. ?Get help right away if: ?You have a severe headache. ?You have persistent vomiting. ?You have severe pain or swelling around your face or eyes. ?You have vision problems. ?You develop confusion. ?Your neck is stiff. ?You have trouble breathing. ?These symptoms may be an emergency. Get help right away. Call 911. ?Do not wait to see if the symptoms will go away. ?Do not drive yourself to the hospital. ?Summary ?A sinus infection is soreness and inflammation of your sinuses. Sinuses are hollow spaces in the bones around your face. ?This condition is caused by nasal tissues that become inflamed or swollen. The swelling traps or blocks the flow of mucus. This allows bacteria, viruses, and fungi to grow, which leads to infection. ?If you were prescribed an antibiotic medicine, take it as told by your health care provider. Do not stop taking the antibiotic even if you start to feel better. ?Keep all follow-up visits. This is important. ?This information is not intended to replace advice given to you by your health care provider. Make sure you discuss any  questions you have with your health care provider. ?Document Revised: 11/16/2021 Document Reviewed: 11/16/2021 ?Elsevier Patient Education ? [place]. ? ?

## 2022-05-17 DIAGNOSIS — J45909 Unspecified asthma, uncomplicated: Secondary | ICD-10-CM | POA: Diagnosis not present

## 2022-05-17 DIAGNOSIS — G4733 Obstructive sleep apnea (adult) (pediatric): Secondary | ICD-10-CM | POA: Diagnosis not present

## 2022-06-15 ENCOUNTER — Ambulatory Visit (INDEPENDENT_AMBULATORY_CARE_PROVIDER_SITE_OTHER): Payer: Medicare Other

## 2022-06-15 DIAGNOSIS — Z0000 Encounter for general adult medical examination without abnormal findings: Secondary | ICD-10-CM | POA: Diagnosis not present

## 2022-06-15 NOTE — Patient Instructions (Signed)
Taylor Leon , Thank you for taking time to come for your Medicare Wellness Visit. I appreciate your ongoing commitment to your health goals. Please review the following plan we discussed and let me know if I can assist you in the future.   Screening recommendations/referrals: Colonoscopy: Done 06/15/21 repeat every 10  Mammogram: Done 02/09/22 repeat every years  Bone Density: Done 06/29/21 repeat every 2 years  Recommended yearly ophthalmology/optometry visit for glaucoma screening and checkup Recommended yearly dental visit for hygiene and checkup  Vaccinations: Influenza vaccine: Done 09/14/21 repeat every year  Pneumococcal vaccine: Up to date Tdap vaccine: Completed 06/05/15 repeat every 10 years  Shingles vaccine: Shingrix discussed. Please contact your pharmacy for coverage information.    Covid-19:Completed 2/13, 3/13, & 11/25/20  Advanced directives: Please bring a copy of your health care power of attorney and living will to the office at your convenience.  Conditions/risks identified: lose weight   Next appointment: Follow up in one year for your annual wellness visit    Preventive Care 65 Years and Older, Female Preventive care refers to lifestyle choices and visits with your health care provider that can promote health and wellness. What does preventive care include? A yearly physical exam. This is also called an annual well check. Dental exams once or twice a year. Routine eye exams. Ask your health care provider how often you should have your eyes checked. Personal lifestyle choices, including: Daily care of your teeth and gums. Regular physical activity. Eating a healthy diet. Avoiding tobacco and drug use. Limiting alcohol use. Practicing safe sex. Taking low-dose aspirin every day. Taking vitamin and mineral supplements as recommended by your health care provider. What happens during an annual well check? The services and screenings done by your health care provider  during your annual well check will depend on your age, overall health, lifestyle risk factors, and family history of disease. Counseling  Your health care provider may ask you questions about your: Alcohol use. Tobacco use. Drug use. Emotional well-being. Home and relationship well-being. Sexual activity. Eating habits. History of falls. Memory and ability to understand (cognition). Work and work Statistician. Reproductive health. Screening  You may have the following tests or measurements: Height, weight, and BMI. Blood pressure. Lipid and cholesterol levels. These may be checked every 5 years, or more frequently if you are over 19 years old. Skin check. Lung cancer screening. You may have this screening every year starting at age 65 if you have a 30-pack-year history of smoking and currently smoke or have quit within the past 15 years. Fecal occult blood test (FOBT) of the stool. You may have this test every year starting at age 59. Flexible sigmoidoscopy or colonoscopy. You may have a sigmoidoscopy every 5 years or a colonoscopy every 10 years starting at age 59. Hepatitis C blood test. Hepatitis B blood test. Sexually transmitted disease (STD) testing. Diabetes screening. This is done by checking your blood sugar (glucose) after you have not eaten for a while (fasting). You may have this done every 1-3 years. Bone density scan. This is done to screen for osteoporosis. You may have this done starting at age 34. Mammogram. This may be done every 1-2 years. Talk to your health care provider about how often you should have regular mammograms. Talk with your health care provider about your test results, treatment options, and if necessary, the need for more tests. Vaccines  Your health care provider may recommend certain vaccines, such as: Influenza vaccine. This is recommended  every year. Tetanus, diphtheria, and acellular pertussis (Tdap, Td) vaccine. You may need a Td booster every  10 years. Zoster vaccine. You may need this after age 60. Pneumococcal 13-valent conjugate (PCV13) vaccine. One dose is recommended after age 74. Pneumococcal polysaccharide (PPSV23) vaccine. One dose is recommended after age 29. Talk to your health care provider about which screenings and vaccines you need and how often you need them. This information is not intended to replace advice given to you by your health care provider. Make sure you discuss any questions you have with your health care provider. Document Released: 01/08/2016 Document Revised: 08/31/2016 Document Reviewed: 10/13/2015 Elsevier Interactive Patient Education  2017 [place] Prevention in the Home Falls can cause injuries. They can happen to people of all ages. There are many things you can do to make your home safe and to help prevent falls. What can I do on the outside of my home? Regularly fix the edges of walkways and driveways and fix any cracks. Remove anything that might make you trip as you walk through a door, such as a raised step or threshold. Trim any bushes or trees on the path to your home. Use bright outdoor lighting. Clear any walking paths of anything that might make someone trip, such as rocks or tools. Regularly check to see if handrails are loose or broken. Make sure that both sides of any steps have handrails. Any raised decks and porches should have guardrails on the edges. Have any leaves, snow, or ice cleared regularly. Use sand or salt on walking paths during winter. Clean up any spills in your garage right away. This includes oil or grease spills. What can I do in the bathroom? Use night lights. Install grab bars by the toilet and in the tub and shower. Do not use towel bars as grab bars. Use non-skid mats or decals in the tub or shower. If you need to sit down in the shower, use a plastic, non-slip stool. Keep the floor dry. Clean up any water that spills on the floor as soon as it  happens. Remove soap buildup in the tub or shower regularly. Attach bath mats securely with double-sided non-slip rug tape. Do not have throw rugs and other things on the floor that can make you trip. What can I do in the bedroom? Use night lights. Make sure that you have a light by your bed that is easy to reach. Do not use any sheets or blankets that are too big for your bed. They should not hang down onto the floor. Have a firm chair that has side arms. You can use this for support while you get dressed. Do not have throw rugs and other things on the floor that can make you trip. What can I do in the kitchen? Clean up any spills right away. Avoid walking on wet floors. Keep items that you use a lot in easy-to-reach places. If you need to reach something above you, use a strong step stool that has a grab bar. Keep electrical cords out of the way. Do not use floor polish or wax that makes floors slippery. If you must use wax, use non-skid floor wax. Do not have throw rugs and other things on the floor that can make you trip. What can I do with my stairs? Do not leave any items on the stairs. Make sure that there are handrails on both sides of the stairs and use them. Fix handrails that are broken  or loose. Make sure that handrails are as long as the stairways. Check any carpeting to make sure that it is firmly attached to the stairs. Fix any carpet that is loose or worn. Avoid having throw rugs at the top or bottom of the stairs. If you do have throw rugs, attach them to the floor with carpet tape. Make sure that you have a light switch at the top of the stairs and the bottom of the stairs. If you do not have them, ask someone to add them for you. What else can I do to help prevent falls? Wear shoes that: Do not have high heels. Have rubber bottoms. Are comfortable and fit you well. Are closed at the toe. Do not wear sandals. If you use a stepladder: Make sure that it is fully opened.  Do not climb a closed stepladder. Make sure that both sides of the stepladder are locked into place. Ask someone to hold it for you, if possible. Clearly mark and make sure that you can see: Any grab bars or handrails. First and last steps. Where the edge of each step is. Use tools that help you move around (mobility aids) if they are needed. These include: Canes. Walkers. Scooters. Crutches. Turn on the lights when you go into a dark area. Replace any light bulbs as soon as they burn out. Set up your furniture so you have a clear path. Avoid moving your furniture around. If any of your floors are uneven, fix them. If there are any pets around you, be aware of where they are. Review your medicines with your doctor. Some medicines can make you feel dizzy. This can increase your chance of falling. Ask your doctor what other things that you can do to help prevent falls. This information is not intended to replace advice given to you by your health care provider. Make sure you discuss any questions you have with your health care provider. Document Released: 10/08/2009 Document Revised: 05/19/2016 Document Reviewed: 01/16/2015 Elsevier Interactive Patient Education  2017 [employer].

## 2022-06-15 NOTE — Progress Notes (Cosign Needed)
Virtual Visit via Telephone Note  I connected with  Taylor Leon on 06/15/22 at  1:00 PM EDT by telephone and verified that I am speaking with the correct person using two identifiers.  Medicare Annual Wellness visit completed telephonically due to Covid-19 pandemic.   Persons participating in this call: This Health Coach and this patient.   Location: Patient: Home Provider: Office    I discussed the limitations, risks, security and privacy concerns of performing an evaluation and management service by telephone and the availability of in person appointments. The patient expressed understanding and agreed to proceed.  Unable to perform video visit due to video visit attempted and failed and/or patient does not have video capability.   Some vital signs may be absent or patient reported.   Willette Brace, LPN   Subjective:   Taylor Leon is a 66 y.o. female who presents for an Initial Medicare Annual Wellness Visit.  Review of Systems     Cardiac Risk Factors include: advanced age (>57mn, >>44women);obesity (BMI >30kg/m2);diabetes mellitus;dyslipidemia     Objective:    There were no vitals filed for this visit. There is no height or weight on file to calculate BMI.     06/15/2022   12:56 PM 06/15/2021   10:36 AM 06/10/2021    1:31 PM 10/19/2018    7:50 AM 05/06/2018    2:23 PM 05/04/2017   10:46 AM  Advanced Directives  Does Patient Have a Medical Advance Directive? Yes _0   Does patient want to make changes to medical advance directive? Yes (MAU/Ambulatory/Procedural Areas - Information given)       Would patient like information on creating a medical advance directive?  No - Patient declined Yes (MAU/Ambulatory/Procedural Areas - Information given) No - Patient declined No - Patient declined No - Patient declined    Current Medications (verified) Outpatient Encounter Medications as of 06/15/2022  Medication Sig   Ascorbic Acid (VITAMIN C) 1000 MG tablet Take  1,000 mg by mouth daily.   cholecalciferol (VITAMIN D) 25 MCG (1000 UNIT) tablet Take 1,000 Units by mouth daily.   COLLAGEN PO Take by mouth.   doxycycline (VIBRA-TABS) 100 MG tablet Take 1 tablet (100 mg total) by mouth 2 (two) times daily.   glucose blood (CONTOUR NEXT TEST) test strip Use to check blood sugar once daily   Lancets (ONETOUCH DELICA PLUS [id number] MISC USE TO CHECK BLOOD SUGAR ONCE DAILY   magnesium gluconate (MAGONATE) 500 MG tablet Take 500 mg by mouth daily.   metFORMIN (GLUCOPHAGE) 1000 MG tablet Take 1 tablet (1,000 mg total) by mouth 2 (two) times daily with a meal.   Multiple Vitamin (MULTIVITAMIN) tablet Take 1 tablet by mouth daily.   ONETOUCH ULTRA test strip USE TO CHECK BLOOD SUGAR ONCE DAILY   OVER THE COUNTER MEDICATION daily. GOLO   BINAXNOW COVID-19 AG HOME TEST KIT See admin instructions.   Fexofenadine HCl (ALLEGRA PO) Take by mouth daily. (Patient not taking: Reported on 04/26/2022)   Respiratory Therapy Supplies (NEBULIZER AIR TUBE/PLUGS) MISC Use with nebulizer every 6(six) hours as needed for wheezing. (Patient not taking: Reported on 06/15/2022)   [DISCONTINUED] albuterol (PROVENTIL) (2.5 MG/3ML) 0.083% nebulizer solution Take 3 mLs (2.5 mg total) by nebulization every 6 (six) hours as needed for wheezing. (Patient not taking: Reported on 04/26/2022)   [DISCONTINUED] albuterol (VENTOLIN HFA) 108 (90 Base) MCG/ACT inhaler Inhale 2 puffs into the lungs every 4 (four) hours as needed for wheezing. (Patient not  taking: Reported on 04/26/2022)   [DISCONTINUED] beclomethasone (QVAR REDIHALER) 40 MCG/ACT inhaler Inhale 2 puffs into the lungs 2 (two) times daily.   [DISCONTINUED] benzonatate (TESSALON) 200 MG capsule Take 1 capsule (200 mg total) by mouth 2 (two) times daily as needed for cough.   [DISCONTINUED] predniSONE (DELTASONE) 20 MG tablet Take 2 tablets (40 mg total) by mouth daily with breakfast.   No facility-administered encounter medications on file as of  06/15/2022.    Allergies (verified) Penicillins   History: Past Medical History:  Diagnosis Date   BPPV (benign paroxysmal positional vertigo) 01/30/2014   CAP (community acquired pneumonia) 10/2018   with acute hypoxic RF, empyema-->VATS   Cyst, breast 01/2012; 06/2012; 07/03/23; 01/29/52   Complicated cysts in upper outer left breast--no evidence of malignancy--f/u bilat diag mammo/left breast u/s on 03/06/15 showed resolution of left breast cysts.  Repeat screening mammogram 1 yr.   Empyema ([place])    drained by CT surgery/VATS.  Full recovery as of 12/2018 CT surgery f/u visit.   H/O allergic rhinitis    Dr. Ishmael Holter   Hepatic cyst 2019   Noted on CT chest imaging.  Multiple, small, "simple"   History of pneumonia 2009 and 2010   supplemential oxygen d/c'd 12/16/18 by pulm   Mild persistent asthma, well controlled 2014   New adult onset asthma after respiratory infection (Dr. Ishmael Holter)   Morbid obesity ([place])    BMI 50+.  At one point in time she was going to Bariatric clinic on [address] and got weekly HCG injections, monthly vit B12 injections, and took phentermine daily.  As of 05/2015 she was no longer doing this.   Nephrolithiasis 04/2018 first episode   Dr. Lovena Neighb[facility] ([facility]): 1.70m right UVJ stone + 6 mm L AML.  Pt elected for trial of passage.   OSA (obstructive sleep apnea) 10/2018   Mild/mod OSA 12/2018--pt not able to afford CPAP as of 01/2019.   Osteoarthritis of both knees    No improvement with steroid injections; did synvisc x 3 yrs; bilat replacement was recommended but she declined.   Prediabetes    she's on metformin   Pulmonary nodule 06/29/2021   on lung ca screening CT->repeat 6 mo   Recurrent low back pain    Thyroid nodule 03/2022   1 on each side, needs rpt u/s 03/2023   Past Surgical History:  Procedure Laterality Date   [place] 2010   Normal coronaries per pt   CARDIOVASCULAR STRESS TEST  2010   abnl per pt; f/u cath  clean   CESAREAN SECTION     x 3   CHOLECYSTECTOMY  1986   COLONOSCOPY  2011   lipoma and small diminutive polyp in rectum (in [place])-->[facility] to do repeat as of 04/05/21   COLONOSCOPY  06/15/2021   Normal-recall 10 yrs   COLONOSCOPY WITH PROPOFOL N/A 06/15/2021   Procedure: COLONOSCOPY WITH PROPOFOL;  Surgeon: BOtis Brace MD;  Location: [facility];  Service: Gastroenterology;  Laterality: N/A;   DEXA  06/2021   06/2021 NORMAL-> rpt 2 yrs.   EMPYEMA DRAINAGE Left 10/24/2018   Evacuation of empyema with decortication.  Procedure: EMPYEMA DRAINAGE;  Surgeon: GGrace Isaac MD;  [place] [facility]  Service: Thoracic;  Laterality: Left;   LUMBAR DISC SURGERY  1993   L5/S1   TONSILLECTOMY AND ADENOIDECTOMY  1967ish   TOTAL ABDOMINAL HYSTERECTOMY  1994   Ovaries are still in.  This was done  for DUB/fibroids.   VIDEO ASSISTED THORACOSCOPY (VATS)/THOROCOTOMY Left 10/24/2018   Procedure: VIDEO ASSISTED THORACOSCOPY (VATS)/THOROCOTOMY with EVACUATION OF EMPYEMA AND DECORTICATION.;  Surgeon: Grace Isaac, MD;  Location: [facility];  Service: Thoracic;  Laterality: Left;   VIDEO BRONCHOSCOPY N/A 10/24/2018   Procedure: VIDEO BRONCHOSCOPY;  Surgeon: Grace Isaac, MD;  Location: [facility];  Service: Thoracic;  Laterality: N/A;   Family History  Problem Relation Age of Onset   Arthritis Mother    Heart disease Mother 49   Hypertension Mother    Diabetes Mother    Colon cancer Mother        50's   Arthritis Paternal Grandmother    Breast cancer Neg Hx    Social History   Socioeconomic History   Marital status: Married    Spouse name: Not on file   Number of children: 4   Years of education: Not on file   Highest education level: Not on file  Occupational History   Not on file  Tobacco Use   Smoking status: Former    Packs/day: 0.75    Years: 34.00    Total pack years: 25.50    Types: Cigarettes    Start date: 08/1976    Quit date: 12/28/2009    Years since quitting:  12.4   Smokeless tobacco: Never  Vaping Use   Vaping Use: Never used  Substance and Sexual Activity   Alcohol use: Yes    Comment: social   Drug use: No   Sexual activity: Not on file  Other Topics Concern   Not on file  Social History Narrative   Married, 4 grown children.   Relocated to [place], [place] from [place] about 2012.   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   Exercise: intermittently does water aerobics..            Social Determinants of Health   Financial Resource Strain: Low Risk  (06/15/2022)   Overall Financial Resource Strain (CARDIA)    Difficulty of Paying Living Expenses: Not hard at all  Food Insecurity: No Food Insecurity (06/15/2022)   Hunger Vital Sign    Worried About Running Out of Food in the Last Year: Never true    Ran Out of Food in the Last Year: Never true  Transportation Needs: No Transportation Needs (06/15/2022)   PRAPARE - Hydrologist (Medical): No    Lack of Transportation (Non-Medical): No  Physical Activity: Inactive (06/15/2022)   Exercise Vital Sign    Days of Exercise per Week: 0 days    Minutes of Exercise per Session: 0 min  Stress: No Stress Concern Present (06/15/2022)   Dent    Feeling of Stress : Not at all  Social Connections: Moderately Integrated (06/15/2022)   Social Connection and Isolation Panel [NHANES]    Frequency of Communication with Friends and Family: More than three times a week    Frequency of Social Gatherings with Friends and Family: More than three times a week    Attends Religious Services: More than 4 times per year    Active Member of [employer] or Organizations: No    Attends Archivist Meetings: Never    Marital Status: Married    Tobacco Counseling Counseling given: Not Answered   Clinical Intake:  Pre-visit preparation  completed: Yes  Pain : No/denies pain  BMI - recorded: 52.63 Nutritional Status: BMI > 30  Obese Nutritional Risks: None Diabetes: Yes CBG done?: Yes (125 per pt) CBG resulted in Enter/ Edit results?: No Did pt. bring in CBG monitor from home?: No  How often do you need to have someone help you when you read instructions, pamphlets, or other written materials from your doctor or pharmacy?: 1 - Never  Diabetic?Nutrition Risk Assessment:  Has the patient had any N/V/D within the last 2 months?  No  Does the patient have any non-healing wounds?  No  Has the patient had any unintentional weight loss or weight gain?  No   Diabetes:  Is the patient diabetic?  Yes  If diabetic, was a CBG obtained today?  Yes  Did the patient bring in their glucometer from home?  No  How often do you monitor your CBG's? Daily.   Financial Strains and Diabetes Management:  Are you having any financial strains with the device, your supplies or your medication? No .  Does the patient want to be seen by Chronic Care Management for management of their diabetes?  No  Would the patient like to be referred to a Nutritionist or for Diabetic Management?  No   Diabetic Exams:  Diabetic Eye Exam: Completed 05/28/21 Diabetic Foot Exam: Overdue, Pt has been advised about the importance in completing this exam. Pt is scheduled for diabetic foot exam on next appt .   Interpreter Needed?: No  Information entered by :: Charlott Rakes, LPN   Activities of Daily Living    06/15/2022   12:59 PM  In your present state of health, do you have any difficulty performing the following activities:  Hearing? 0  Vision? 0  Difficulty concentrating or making decisions? 0  Walking or climbing stairs? 1  Comment knee issues  Dressing or bathing? 0  Doing errands, shopping? 0  Preparing Food and eating ? N  Using the Toilet? N  In the past six months, have you accidently leaked urine? N  Do you have problems with  loss of bowel control? N  Managing your Medications? N  Managing your Finances? N  Housekeeping or managing your Housekeeping? N    Patient Care Team: Tammi Sou, MD as PCP - General (Family Medicine) Gean Quint, MD (Inactive) as Consulting Physician (Allergy) Ceasar Mons, MD as Consulting Physician (Urology) Rigoberto Noel, MD as Consulting Physician (Pulmonary Disease) Otis Brace, MD as Consulting Physician (Gastroenterology)  Indicate any recent Medical Services you may have received from other than [facility] providers in the past year (date may be approximate).     Assessment:   This is a routine wellness examination for Shandricka.  Hearing/Vision screen Hearing Screening - Comments:: Pt denies hearing issues any hearing issues  Vision Screening - Comments:: Pt follows up with provider at [facility]   Dietary issues and exercise activities discussed: Current Exercise Habits: The patient does not participate in regular exercise at present   Goals Addressed             This Visit's Progress    Patient Stated       Lose weight        Depression Screen    06/15/2022   12:55 PM 12/28/2021    8:50 AM 07/31/2020    8:36 AM 01/25/2019    8:37 AM 11/05/2018    3:27 PM 03/14/2018    9:43 AM  PHQ 2/9 Scores  PHQ - 2 Score 0 0 0  0 0 0    Fall Risk    06/15/2022   12:59 PM 12/28/2021    8:50 AM 06/10/2021    1:29 PM 11/05/2018    3:27 PM 03/14/2018    9:43 AM  Fall Risk   Falls in the past year? 0 0 0 0 Yes  Number falls in past yr: 0 0 0  1  Injury with Fall? 0 0 0  Yes  Risk for fall due to : Impaired vision      Follow up Falls prevention discussed Falls evaluation completed Falls evaluation completed  Falls prevention discussed    FALL RISK PREVENTION PERTAINING TO THE HOME:  Any stairs in or around the home? No  If so, are there any without handrails? No  Home free of loose throw rugs in walkways, pet beds, electrical cords, etc? Yes   Adequate lighting in your home to reduce risk of falls? Yes   ASSISTIVE DEVICES UTILIZED TO PREVENT FALLS:  Life alert? No  Use of a cane, walker or w/c? No  Grab bars in the bathroom? No  Shower chair or bench in shower? No  Elevated toilet seat or a handicapped toilet? No   TIMED UP AND GO:  Was the test performed? No .  Cognitive Function:        06/15/2022    1:01 PM  6CIT Screen  What Year? 0 points  What month? 0 points  What time? 0 points  Count back from 20 0 points  Months in reverse 0 points  Repeat phrase 0 points  Total Score 0 points    Immunizations Immunization History  Administered Date(s) Administered   Fluad Quad(high Dose 65+) 09/14/2021   Influenza,inj,Quad PF,6+ Mos 08/29/2013, 09/10/2015, 12/06/2016, 11/02/2017, 10/10/2018, 08/23/2019, 12/21/2020   Moderna Covid-19 Vaccine Bivalent Booster 33yr & up 11/09/2021   Moderna SARS-COV2 Booster Vaccination 05/25/2021   Moderna Sars-Covid-2 Vaccination 02/08/2020, 03/07/2020, 11/25/2020   PNEUMOCOCCAL CONJUGATE-20 06/10/2021   PPD Test 01/27/2017   Pneumococcal Polysaccharide-23 01/25/2019   Tdap 06/05/2015   Zoster Recombinat (Shingrix) 06/20/2017, 08/30/2017    TDAP status: Up to date  Flu Vaccine status: Up to date  Pneumococcal vaccine status: Up to date  Covid-19 vaccine status: Completed vaccines  Qualifies for Shingles Vaccine? Yes   Zostavax completed Yes   Shingrix Completed?: Yes  Screening Tests Health Maintenance  Topic Date Due   URINE MICROALBUMIN  01/26/2020   FOOT EXAM  02/08/2022   COVID-19 Vaccine (5 - Moderna series) 03/09/2022   OPHTHALMOLOGY EXAM  05/28/2022   INFLUENZA VACCINE  07/26/2022   HEMOGLOBIN A1C  10/05/2022   MAMMOGRAM  02/10/2024   TETANUS/TDAP  06/04/2025   COLONOSCOPY (Pts 45-467yrInsurance coverage will need to be confirmed)  06/16/2031   Pneumonia Vaccine 6546Years old  Completed   DEXA SCAN  Completed   Hepatitis C Screening  Completed    Zoster Vaccines- Shingrix  Completed   HPV VACCINES  Aged Out    Health Maintenance  Health Maintenance Due  Topic Date Due   URINE MICROALBUMIN  01/26/2020   FOOT EXAM  02/08/2022   COVID-19 Vaccine (5 - Moderna series) 03/09/2022   OPHTHALMOLOGY EXAM  05/28/2022    Colorectal cancer screening: Type of screening: Colonoscopy. Completed 06/15/21. Repeat every 10 years  Mammogram status: Completed 02/09/22. Repeat every year  Bone Density status: Completed 06/29/21. Results reflect: Bone density results: NORMAL. Repeat every 2 years.  Additional Screening:  Hepatitis C Screening: Completed 06/20/17  Vision  Screening: Recommended annual ophthalmology exams for early detection of glaucoma and other disorders of the eye. Is the patient up to date with their annual eye exam?  Yes  Who is the provider or what is the name of the office in which the patient attends annual eye exams? [facility]  If pt is not established with a provider, would they like to be referred to a provider to establish care? No .   Dental Screening: Recommended annual dental exams for proper oral hygiene  Community Resource Referral / Chronic Care Management: CRR required this visit?  No   CCM required this visit?  No      Plan:     I have personally reviewed and noted the following in the patient's chart:   Medical and social history Use of alcohol, tobacco or illicit drugs  Current medications and supplements including opioid prescriptions. Patient is not currently taking opioid prescriptions. Functional ability and status Nutritional status Physical activity Advanced directives List of other physicians Hospitalizations, surgeries, and ER visits in previous 12 months Vitals Screenings to include cognitive, depression, and falls Referrals and appointments  In addition, I have reviewed and discussed with patient certain preventive protocols, quality metrics, and best practice recommendations. A written  personalized care plan for preventive services as well as general preventive health recommendations were provided to patient.     Willette Brace, LPN   [id number]   Nurse Notes: None

## 2022-08-17 DIAGNOSIS — M170 Bilateral primary osteoarthritis of knee: Secondary | ICD-10-CM | POA: Diagnosis not present

## 2022-08-22 DIAGNOSIS — J45909 Unspecified asthma, uncomplicated: Secondary | ICD-10-CM | POA: Diagnosis not present

## 2022-08-22 DIAGNOSIS — G4733 Obstructive sleep apnea (adult) (pediatric): Secondary | ICD-10-CM | POA: Diagnosis not present

## 2022-08-24 DIAGNOSIS — M170 Bilateral primary osteoarthritis of knee: Secondary | ICD-10-CM | POA: Diagnosis not present

## 2022-08-24 DIAGNOSIS — M1711 Unilateral primary osteoarthritis, right knee: Secondary | ICD-10-CM | POA: Diagnosis not present

## 2022-08-24 DIAGNOSIS — M1712 Unilateral primary osteoarthritis, left knee: Secondary | ICD-10-CM | POA: Diagnosis not present

## 2022-08-30 DIAGNOSIS — M170 Bilateral primary osteoarthritis of knee: Secondary | ICD-10-CM | POA: Diagnosis not present

## 2022-09-01 DIAGNOSIS — J45909 Unspecified asthma, uncomplicated: Secondary | ICD-10-CM | POA: Diagnosis not present

## 2022-09-01 DIAGNOSIS — G4733 Obstructive sleep apnea (adult) (pediatric): Secondary | ICD-10-CM | POA: Diagnosis not present

## 2022-09-07 DIAGNOSIS — J45909 Unspecified asthma, uncomplicated: Secondary | ICD-10-CM | POA: Diagnosis not present

## 2022-09-07 DIAGNOSIS — G4733 Obstructive sleep apnea (adult) (pediatric): Secondary | ICD-10-CM | POA: Diagnosis not present

## 2022-09-07 DIAGNOSIS — R0902 Hypoxemia: Secondary | ICD-10-CM | POA: Diagnosis not present

## 2022-09-07 DIAGNOSIS — J189 Pneumonia, unspecified organism: Secondary | ICD-10-CM | POA: Diagnosis not present

## 2022-09-21 IMAGING — MG MM DIGITAL SCREENING BILAT W/ TOMO AND CAD
8 series · 8 of 24 positions shown · non-contrast
Comparison: Previous exam(s).

CLINICAL DATA: Screening.

EXAM:
DIGITAL SCREENING BILATERAL MAMMOGRAM WITH TOMOSYNTHESIS AND CAD
TECHNIQUE: Bilateral screening digital craniocaudal and mediolateral oblique
mammograms were obtained. Bilateral screening digital breast
tomosynthesis was performed. The images were evaluated with
computer-aided detection.

[L CC synth-2D]
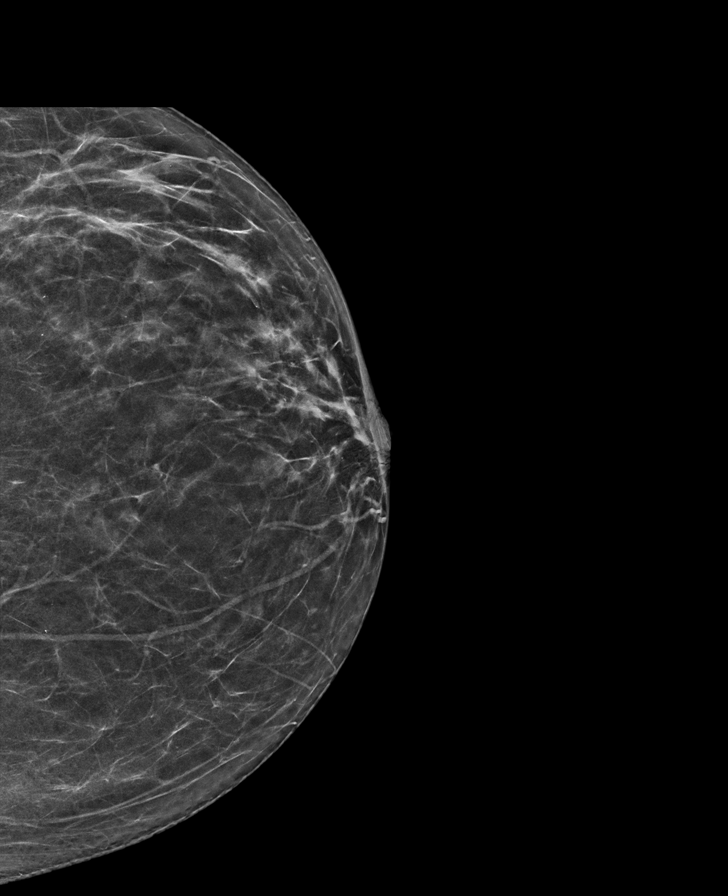

[R CC synth-2D]
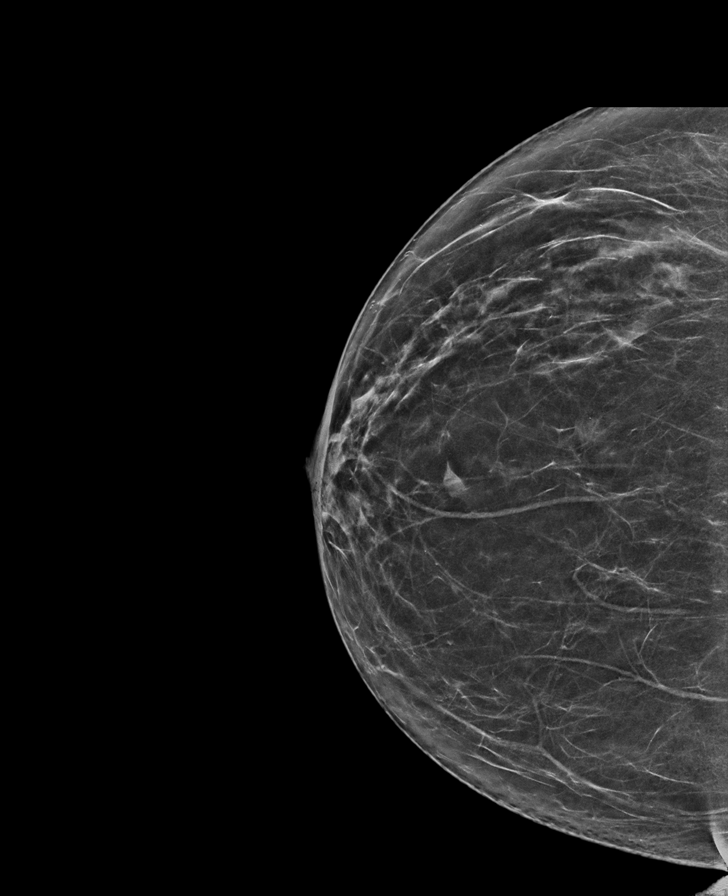

[L MLO synth-2D]
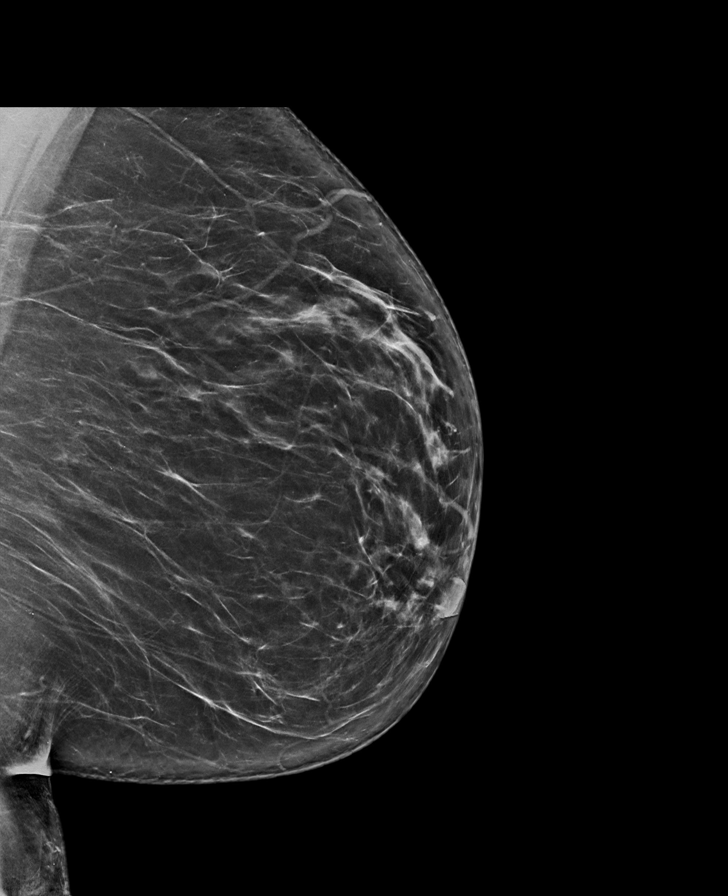

[R MLO synth-2D]
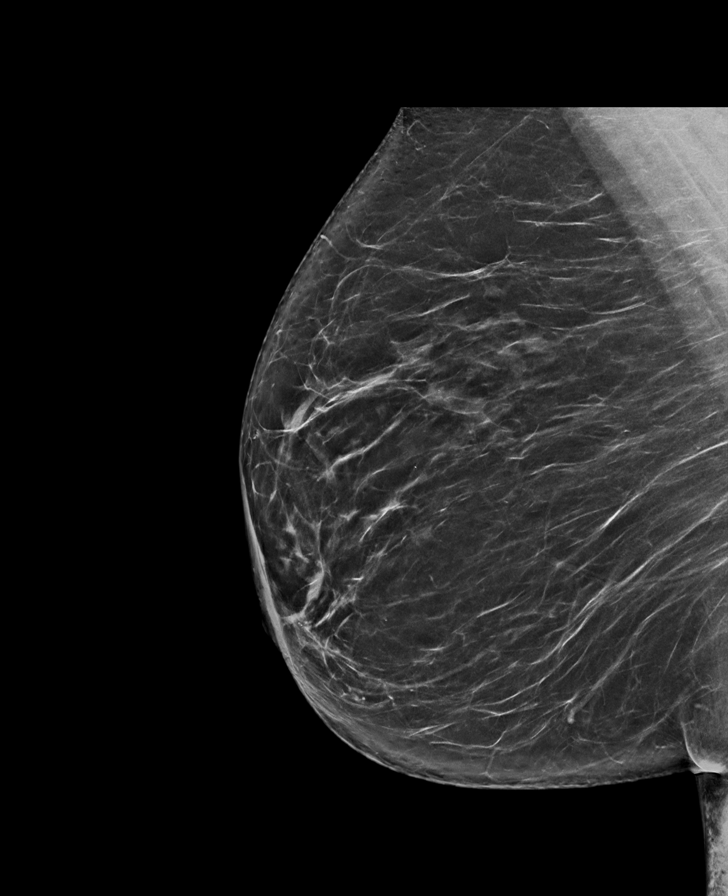

[R CC tomo · tomo slice 35/69.0]
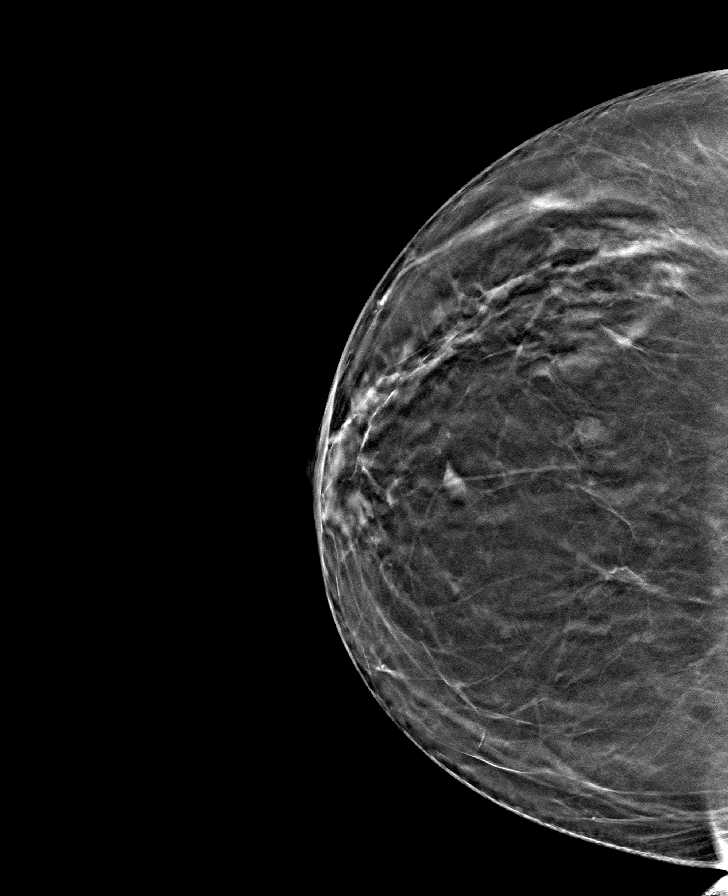

[L MLO tomo · tomo slice 43/84.0]
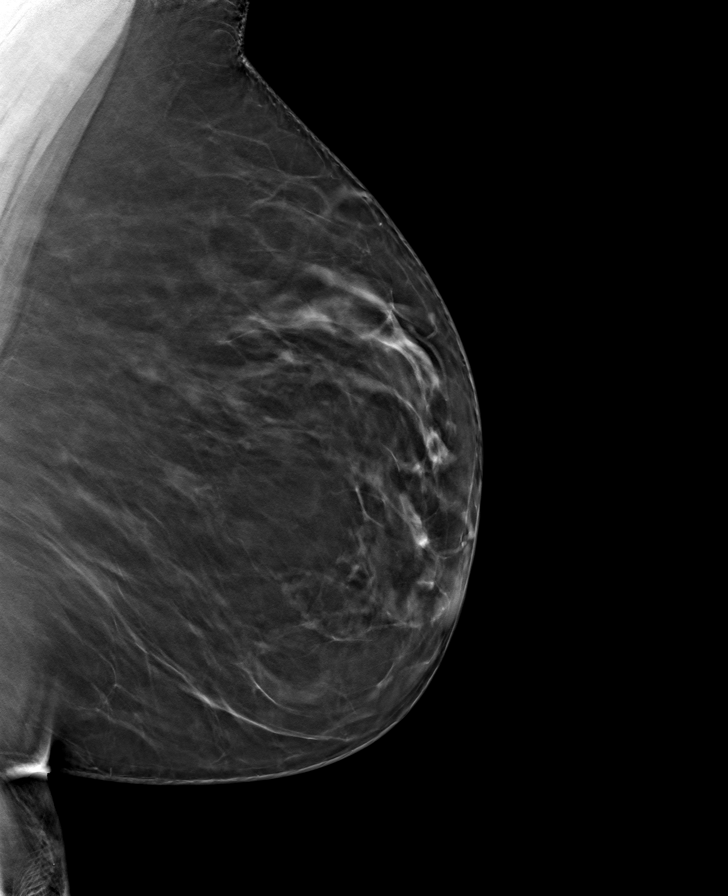

[R MLO tomo · tomo slice 41/81.0]
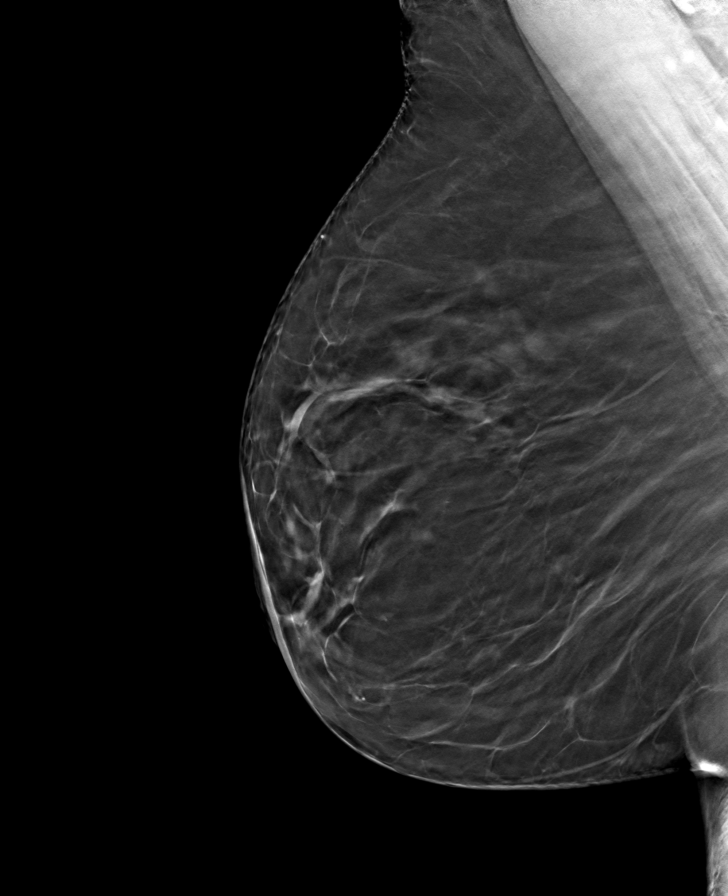

[L CC tomo · tomo slice 31/62.0]
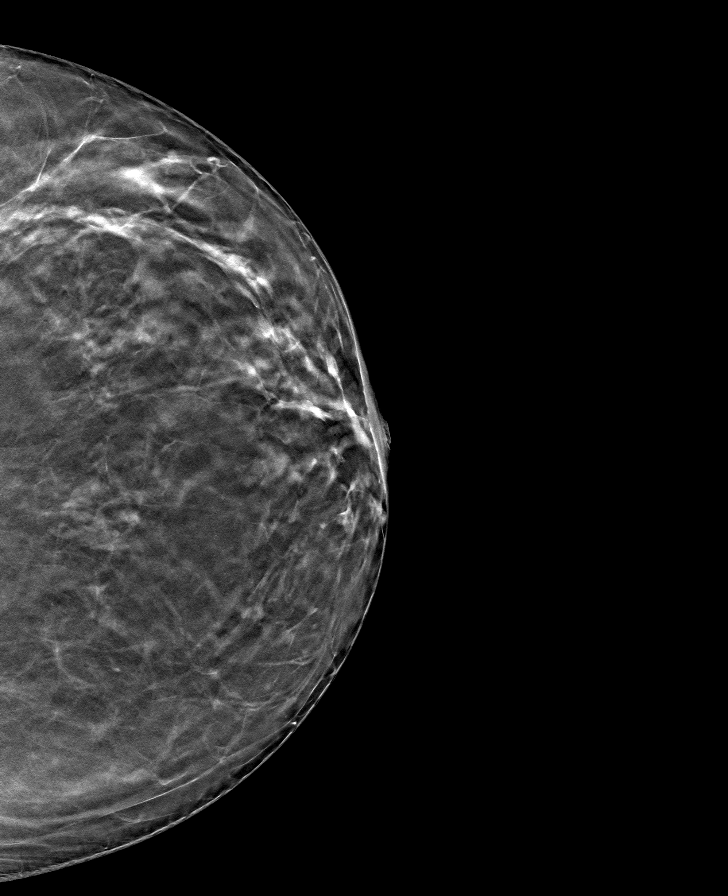

[8 of 24 positions shown; findings below may reference images not displayed]

ACR Breast Density Category b: There are scattered areas of
fibroglandular density.
FINDINGS: There are no findings suspicious for malignancy.
IMPRESSION: No mammographic evidence of malignancy. A result letter of this
screening mammogram will be mailed directly to the patient.

RECOMMENDATION:
Screening mammogram in one year. (Code:51-O-LD2)

BI-RADS CATEGORY  1: Negative.

## 2022-09-22 DIAGNOSIS — G4733 Obstructive sleep apnea (adult) (pediatric): Secondary | ICD-10-CM | POA: Diagnosis not present

## 2022-09-22 DIAGNOSIS — J45909 Unspecified asthma, uncomplicated: Secondary | ICD-10-CM | POA: Diagnosis not present

## 2022-09-29 ENCOUNTER — Encounter: Payer: Self-pay | Admitting: Pulmonary Disease

## 2022-09-29 ENCOUNTER — Other Ambulatory Visit: Payer: Self-pay

## 2022-09-29 ENCOUNTER — Ambulatory Visit: Payer: Medicare Other | Admitting: Pulmonary Disease

## 2022-09-29 DIAGNOSIS — G4733 Obstructive sleep apnea (adult) (pediatric): Secondary | ICD-10-CM

## 2022-09-29 DIAGNOSIS — Z9189 Other specified personal risk factors, not elsewhere classified: Secondary | ICD-10-CM

## 2022-09-29 DIAGNOSIS — R911 Solitary pulmonary nodule: Secondary | ICD-10-CM | POA: Diagnosis not present

## 2022-09-29 DIAGNOSIS — J45991 Cough variant asthma: Secondary | ICD-10-CM

## 2022-09-29 MED ORDER — ALBUTEROL SULFATE HFA 108 (90 BASE) MCG/ACT IN AERS: 2.0000 | INHALATION_SPRAY | Freq: Four times a day (QID) | RESPIRATORY_TRACT | 6 refills | Status: DC | PRN

## 2022-09-29 MED ORDER — FLUTICASONE PROPIONATE 50 MCG/ACT NA SUSP: 1.0000 | Freq: Every day | NASAL | 2 refills | Status: AC

## 2022-09-29 MED ORDER — QVAR REDIHALER 40 MCG/ACT IN AERB: 2.0000 | INHALATION_SPRAY | Freq: Two times a day (BID) | RESPIRATORY_TRACT | 1 refills | Status: DC

## 2022-09-29 NOTE — Patient Instructions (Addendum)
X refills on qvar & albuterol  X Rx for Flonase nasal spray-1 spray in each nare at bedtime during spring and fall  CPAP is working well

## 2022-09-29 NOTE — Assessment & Plan Note (Addendum)
Use Qvar during change of season in spring and fall. Use albuterol as needed only.  Continue Allegra and add Flonase for seasonal allergies  She will take her flu shot with her PCP RSV vaccine was recommended

## 2022-09-29 NOTE — Assessment & Plan Note (Signed)
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

## 2022-09-29 NOTE — Assessment & Plan Note (Signed)
CPAP download was reviewed which shows excellent control of events on auto settings 5 to 15 cm with average pressure of 11 and max pressure of 12 cm.  She has a large leak which is improved within the last 1 week. She is very compliant and CPAP is only helped improve her daytime somnolence and fatigue.  Weight loss encouraged, compliance with goal of at least 4-6 hrs every night is the expectation. Advised against medications with sedative side effects Cautioned against driving when sleepy - understanding that sleepiness will vary on a day to day basis

## 2022-09-29 NOTE — Addendum Note (Signed)
Addended by: Irine Seal B on: 09/29/2022 02:28 PM   Modules accepted: Orders

## 2022-09-29 NOTE — Progress Notes (Signed)
   Subjective:    Patient ID: Taylor Leon, female    DOB: 1956-01-08, 66 y.o.   MRN: [id number]  HPI  66 yo morbidly obese woman for FU of OSA & chronic cough -53m RML nodule, stable since 2019    She has a chronic cough and intermittent wheezing [place]  ENT evaluation was negative.  Dr. HIshmael Holterallergist diagnosed her with "allergy induced asthma"  Last flareup was in 08/2018.  She uses Qvar during spring and fall.   She was admitted 10/19/2018 for pneumonia and parapneumonic effusion and underwent VATS thoracotomy  844-monthollow-up visit. Last office visit we decided to hold off on Qvar and continue Allegra during spring and fall.  She did well during spring. This fall, she developed initially runny nose and then a cough for which she had to restart Qvar.  This is helped the cough and now she only has small amounts of stringy clear mucus, initially had attention. She has not needed albuterol for rescue. She is back on Allegra.  She has gained 6 pounds since her last visit.  Denies any problems with CPAP mask or pressure  Significant tests/ events reviewed 11/2018 NPSG -  Wt 328 lbs  -mod OSA -RDI 21/h, AHI 9/h 04/2021 HST AHI 14/h   PFTs 01/2019 nml   09/2018 CT angiogram chest left lower lobe pneumonia.  06/2021 LDCT chest-RADS 3, 7 mm nodule right middle lobe   01/2022 CT chest without contrast stable nodule, left thyroid nodule  Review of Systems neg for any significant sore throat, dysphagia, itching, sneezing, nasal congestion or excess/ purulent secretions, fever, chills, sweats, unintended wt loss, pleuritic or exertional cp, hempoptysis, orthopnea pnd or change in chronic leg swelling. Also denies presyncope, palpitations, heartburn, abdominal pain, nausea, vomiting, diarrhea or change in bowel or urinary habits, dysuria,hematuria, rash, arthralgias, visual complaints, headache, numbness weakness or ataxia.     Objective:    Physical Exam  Gen. Pleasant, obese, in no distress ENT - no lesions, no post nasal drip Neck: No JVD, no thyromegaly, no carotid bruits Lungs: no use of accessory muscles, no dullness to percussion, decreased without rales or rhonchi  Cardiovascular: Rhythm regular, heart sounds  normal, no murmurs or gallops, no peripheral edema Musculoskeletal: No deformities, no cyanosis or clubbing , no tremors        Assessment & Plan:

## 2022-10-01 DIAGNOSIS — G4733 Obstructive sleep apnea (adult) (pediatric): Secondary | ICD-10-CM | POA: Diagnosis not present

## 2022-10-01 DIAGNOSIS — J45909 Unspecified asthma, uncomplicated: Secondary | ICD-10-CM | POA: Diagnosis not present

## 2022-10-04 ENCOUNTER — Ambulatory Visit: Payer: Medicare Other | Admitting: Family Medicine

## 2022-10-07 ENCOUNTER — Encounter: Payer: Self-pay | Admitting: Family Medicine

## 2022-10-07 ENCOUNTER — Ambulatory Visit (INDEPENDENT_AMBULATORY_CARE_PROVIDER_SITE_OTHER): Payer: Medicare Other | Admitting: Family Medicine

## 2022-10-07 VITALS — BP 108/66 | HR 64 | Temp 98.4°F | Ht 67.0 in | Wt 338.8 lb

## 2022-10-07 DIAGNOSIS — R7303 Prediabetes: Secondary | ICD-10-CM | POA: Diagnosis not present

## 2022-10-07 DIAGNOSIS — Z23 Encounter for immunization: Secondary | ICD-10-CM | POA: Diagnosis not present

## 2022-10-07 LAB — BASIC METABOLIC PANEL
BUN: 13 mg/dL (ref 6–23)
CO2: 28 mEq/L (ref 19–32)
Calcium: 9.5 mg/dL (ref 8.4–10.5)
Chloride: 109 mEq/L (ref 96–112)
Creatinine, Ser: 0.83 mg/dL (ref 0.40–1.20)
GFR: 73.33 mL/min (ref >60.00)
Glucose, Bld: 99 mg/dL (ref 70–99)
Potassium: 4.7 mEq/L (ref 3.5–5.1)
Sodium: 143 mEq/L (ref 135–145)

## 2022-10-07 LAB — LIPID PANEL
Cholesterol: 175 mg/dL (ref 0–200)
HDL: 82.2 mg/dL (ref >39.00)
LDL Cholesterol: 69 mg/dL (ref 0–99)
NonHDL: 92.41
Total CHOL/HDL Ratio: 2
Triglycerides: 119 mg/dL (ref 0.0–149.0)
VLDL: 23.8 mg/dL (ref 0.0–40.0)

## 2022-10-07 LAB — POCT GLYCOSYLATED HEMOGLOBIN (HGB A1C)
HbA1c POC (<> result, manual entry): 5.9 % (ref 4.0–5.6)
HbA1c, POC (controlled diabetic range): 5.9 % (ref 0.0–7.0)
HbA1c, POC (prediabetic range): 5.9 % (ref 5.7–6.4)
Hemoglobin A1C: 5.9 % — AB (ref 4.0–5.6)

## 2022-10-07 NOTE — Progress Notes (Signed)
OFFICE VISIT  10/07/2022  CC:  Chief Complaint  Patient presents with   Borderline Diabetes    Patient is a 66 y.o. female who presents for 4-monthfollow-up borderline diabetes A/P as of last visit: "#1 borderline diabetes. POC Hba1c 5.8% today--congratulated patient, keep up the great work.  2.  Thyroid nodule, incidentally noted on CT chest. Ordered thyroid ultrasound for further evaluation."  INTERIM HX: Feeling fine, has not been dieting as rigorously lately b/c taking care of lots of things in life lately: ill husband, grandkids.  Past Medical History:  Diagnosis Date   BPPV (benign paroxysmal positional vertigo) 01/30/2014   CAP (community acquired pneumonia) 10/2018   with acute hypoxic RF, empyema-->VATS   Cyst, breast 01/2012; 06/2012; 31/5/40 30/8/67  Complicated cysts in upper outer left breast--no evidence of malignancy--f/u bilat diag mammo/left breast u/s on 03/06/15 showed resolution of left breast cysts.  Repeat screening mammogram 1 yr.   [place]ema ([place]    drained by CT surgery/VATS.  Full recovery as of 12/2018 CT surgery f/u visit.   H/O allergic rhinitis    Dr. HIshmael Holter  Hepatic cyst 2019   Noted on CT chest imaging.  Multiple, small, "simple"   History of pneumonia 2009 and 2010   supplemential oxygen d/c'd 12/16/18 by pulm   Mild persistent asthma, well controlled 2014   New adult onset asthma after respiratory infection (Dr. HIshmael Holter   Morbid obesity ([place]    BMI 50+.  At one point in time she was [facility] [address] and got weekly HCG injections, monthly vit B12 injections, and took phentermine daily.  As of 05/2015 she was no longer doing this.   Nephrolithiasis 04/2018 first episode   Dr. [facility] Neighbours([facility]): 1.551mright UVJ stone + 6 mm L AML.  Pt elected for trial of passage.   OSA (obstructive sleep apnea) 10/2018   Mild/mod OSA 12/2018--pt not able to afford CPAP as of 01/2019.   Osteoarthritis of both knees    No improvement with  steroid injections; did synvisc x 3 yrs; bilat replacement was recommended but she declined.   Prediabetes    she's on metformin   Pulmonary nodule 06/29/2021   on lung ca screening CT->repeat 6 mo   Recurrent low back pain    Thyroid nodule 03/2022   1 on each side, needs rpt u/s 03/2023    Past Surgical History:  Procedure Laterality Date   [id number]   Normal coronaries per pt   CARDIOVASCULAR STRESS TEST  2010   abnl per pt; f/u cath clean   CESAREAN SECTION     x 3   CHOLECYSTECTOMY  1986   COLONOSCOPY  2011   lipoma and small diminutive polyp in rectum (in [place])-->[facility] to do repeat as of 04/05/21   COLONOSCOPY  06/15/2021   Normal-recall 10 yrs   COLONOSCOPY WITH PROPOFOL N/A 06/15/2021   Procedure: COLONOSCOPY WITH PROPOFOL;  Surgeon: BrOtis BraceMD;  [facility];  Service: Gastroenterology;  Laterality: N/A;   DEXA  06/2021   06/2021 NORMAL-> rpt 2 yrs.   EMPYEMA DRAINAGE Left 10/24/2018   Evacuation of empyema with decortication.  Procedure: EMPYEMA DRAINAGE;  Surgeon: GeGrace IsaacMD;  [facility] Service: Thoracic;  Laterality: Left;   LUMBAR DISC SURGERY  1993   L5/S1   TONSILLECTOMY AND ADENOIDECTOMY  1967ish   TOTAL ABDOMINAL HYSTERECTOMY  1994   Ovaries  are still in.  This was done for DUB/fibroids.   VIDEO ASSISTED THORACOSCOPY (VATS)/THOROCOTOMY Left 10/24/2018   Procedure: VIDEO ASSISTED THORACOSCOPY (VATS)/THOROCOTOMY with EVACUATION OF EMPYEMA AND DECORTICATION.;  Surgeon: Grace Isaac, MD;  Location: [facility];  Service: Thoracic;  Laterality: Left;   VIDEO BRONCHOSCOPY N/A 10/24/2018   Procedure: VIDEO BRONCHOSCOPY;  Surgeon: Grace Isaac, MD;  [facility];  Service: Thoracic;  Laterality: N/A;    Outpatient Medications Prior to Visit  Medication Sig Dispense Refill   albuterol (VENTOLIN HFA) 108 (90 Base) MCG/ACT inhaler Inhale 2 puffs into the lungs every 6 (six) hours  as needed for wheezing or shortness of breath. 8 g 6   Ascorbic Acid (VITAMIN C) 1000 MG tablet Take 1,000 mg by mouth daily.     beclomethasone (QVAR REDIHALER) 40 MCG/ACT inhaler Inhale 2 puffs into the lungs 2 (two) times daily. 1 each 1   cholecalciferol (VITAMIN D) 25 MCG (1000 UNIT) tablet Take 1,000 Units by mouth daily.     COLLAGEN PO Take by mouth.     Fexofenadine HCl (ALLEGRA PO) Take by mouth daily.     fluticasone (FLONASE) 50 MCG/ACT nasal spray Place 1 spray into both nostrils daily. 16 g 2   glucose blood (CONTOUR NEXT TEST) test strip Use to check blood sugar once daily 100 each 11   Lancets (ONETOUCH DELICA PLUS [id number]) MISC USE TO CHECK BLOOD SUGAR ONCE DAILY 100 each 12   magnesium gluconate (MAGONATE) 500 MG tablet Take 500 mg by mouth daily.     metFORMIN (GLUCOPHAGE) 1000 MG tablet Take 1 tablet (1,000 mg total) by mouth 2 (two) times daily with a meal. 180 tablet 3   Multiple Vitamin (MULTIVITAMIN) tablet Take 1 tablet by mouth daily.     ONETOUCH ULTRA test strip USE TO CHECK BLOOD SUGAR ONCE DAILY 100 strip 12   OVER THE COUNTER MEDICATION daily. GOLO     No facility-administered medications prior to visit.    Allergies  Allergen Reactions   Penicillins Nausea Only and Rash    Has patient had a PCN reaction causing immediate rash, facial/tongue/throat swelling, SOB or lightheadedness with hypotension: yES Has patient had a PCN reaction causing severe rash involving mucus membranes or skin necrosis: No Has patient had a PCN reaction that required hospitalization: No Has patient had a PCN reaction occurring within the last 10 years: No If all of the above answers are "NO", then may proceed with Cephalosporin use. CC    ROS As per HPI  PE:    10/07/2022    8:20 AM 09/29/2022    1:07 PM 04/26/2022    2:37 PM  Vitals with BMI  Height '5\' 7"'$  '5\' 7"'$  '5\' 7"'$   Weight 338 lbs 13 oz 337 lbs 3 oz 336 lbs  BMI 53.05 71.0 62.69  Systolic 485 462 703  Diastolic 66  74 56  Pulse 64 63 67   Physical Exam  Gen: Alert, well appearing.  Patient is oriented to person, place, time, and situation. AFFECT: pleasant, lucid thought and speech. No further exam today.  LABS:  Last CBC Lab Results  Component Value Date   WBC 7.7 12/28/2021   HGB 12.7 12/28/2021   HCT 40.4 12/28/2021   MCV 85.9 12/28/2021   MCH 26.3 10/31/2018   RDW 16.0 (H) 12/28/2021   PLT 274.0 50/08/3817   Last metabolic panel Lab Results  Component Value Date   GLUCOSE 100 (H) 12/28/2021   NA 142 12/28/2021  K 4.5 12/28/2021   CL 106 12/28/2021   CO2 29 12/28/2021   BUN 15 12/28/2021   CREATININE 0.87 12/28/2021   GFRNONAA >60 10/30/2018   CALCIUM 9.5 12/28/2021   PHOS 2.8 10/26/2018   PROT 6.6 12/28/2021   ALBUMIN 3.8 12/28/2021   BILITOT 0.5 12/28/2021   ALKPHOS 63 12/28/2021   AST 15 12/28/2021   ALT 13 12/28/2021   ANIONGAP 7 10/30/2018   Last lipids Lab Results  Component Value Date   CHOL 189 12/28/2021   HDL 80.30 12/28/2021   LDLCALC 84 12/28/2021   TRIG 120.0 12/28/2021   CHOLHDL 2 12/28/2021   Last hemoglobin A1c Lab Results  Component Value Date   HGBA1C 5.9 (A) 10/07/2022   HGBA1C 5.9 10/07/2022   HGBA1C 5.9 10/07/2022   HGBA1C 5.9 10/07/2022   Last thyroid functions Lab Results  Component Value Date   TSH 1.60 12/28/2021   IMPRESSION AND PLAN:  Borderlined DM:  POC Hba1c today is 5.9%. Continue metformin 1000 mg bid. Lytes/cr and Lipid panel today.  An After Visit Summary was printed and given to the patient.  FOLLOW UP: Return in about 3 months (around 01/07/2023) for annual CPE (fasting). Next cpe 12/2022  Signed:  Crissie Sickles, MD           10/07/2022

## 2022-10-13 ENCOUNTER — Encounter: Payer: Self-pay | Admitting: Family Medicine

## 2022-10-22 DIAGNOSIS — J45909 Unspecified asthma, uncomplicated: Secondary | ICD-10-CM | POA: Diagnosis not present

## 2022-10-22 DIAGNOSIS — G4733 Obstructive sleep apnea (adult) (pediatric): Secondary | ICD-10-CM | POA: Diagnosis not present

## 2022-11-21 ENCOUNTER — Encounter: Payer: Self-pay | Admitting: Family Medicine

## 2022-11-21 ENCOUNTER — Ambulatory Visit (INDEPENDENT_AMBULATORY_CARE_PROVIDER_SITE_OTHER): Payer: Medicare Other | Admitting: Family Medicine

## 2022-11-21 VITALS — BP 90/61 | HR 82 | Temp 98.5°F

## 2022-11-21 DIAGNOSIS — J4530 Mild persistent asthma, uncomplicated: Secondary | ICD-10-CM

## 2022-11-21 DIAGNOSIS — B9689 Other specified bacterial agents as the cause of diseases classified elsewhere: Secondary | ICD-10-CM | POA: Diagnosis not present

## 2022-11-21 DIAGNOSIS — J988 Other specified respiratory disorders: Secondary | ICD-10-CM | POA: Diagnosis not present

## 2022-11-21 DIAGNOSIS — R051 Acute cough: Secondary | ICD-10-CM

## 2022-11-21 LAB — POCT INFLUENZA A/B
Influenza A, POC: NEGATIVE
Influenza B, POC: NEGATIVE

## 2022-11-21 LAB — POC COVID19 BINAXNOW: SARS Coronavirus 2 Ag: NEGATIVE

## 2022-11-21 MED ORDER — PREDNISONE 20 MG PO TABS: ORAL_TABLET | ORAL | 0 refills | Status: DC

## 2022-11-21 MED ORDER — DOXYCYCLINE HYCLATE 100 MG PO TABS: 100.0000 mg | ORAL_TABLET | Freq: Two times a day (BID) | ORAL | 0 refills | Status: DC

## 2022-11-21 MED ORDER — BENZONATATE 100 MG PO CAPS: 200.0000 mg | ORAL_CAPSULE | Freq: Two times a day (BID) | ORAL | 0 refills | Status: DC | PRN

## 2022-11-21 MED ORDER — IPRATROPIUM-ALBUTEROL 0.5-2.5 (3) MG/3ML IN SOLN
3.0000 mL | Freq: Once | RESPIRATORY_TRACT | Status: AC
  Administered 2022-11-21: 3 mL via RESPIRATORY_TRACT

## 2022-11-21 NOTE — Patient Instructions (Addendum)
Return in about 2 weeks (around 12/05/2022), or if symptoms worsen or fail to improve.        Great to see you today.  I have refilled the medication(s) we provide.   If labs were collected, we will inform you of lab results once received either by echart message or telephone call.   - echart message- for normal results that have been seen by the patient already.   - telephone call: abnormal results or if patient has not viewed results in their echart.

## 2022-11-21 NOTE — Progress Notes (Signed)
Taylor Leon , [birth year], 66 y.o., female MRN: [id number] Patient Care Team    Relationship Specialty Notifications Start End  McGowen, Adrian Blackwater, MD PCP - General Family Medicine  03/28/12   Gean Quint, MD (Inactive) Consulting Physician Allergy  01/16/13   Ceasar Mons, MD Consulting Physician Urology  05/08/18   Rigoberto Noel, MD Consulting Physician Pulmonary Disease  12/30/18   Otis Brace, MD Consulting Physician Gastroenterology  04/21/21    Comment: [facility]    Chief Complaint  Patient presents with   Cough    1 week     Subjective: Pt presents for an OV with complaints of cough  of 1 week duration.  Associated symptoms include eye drainage and sinus congestion. She reports she has taken some her Robitussin to help with the cough.  She reports swelling underneath her left eye and no eye redness.  She denies fevers, chills, nausea.  Appetite is normal.  No GI symptoms.  Sinus pressure and headache present.  Reports she has not used her albuterol inhaler. She is allergic to PCN.  PMH: OSA, AR, asthmatic bronchitis.    10/07/2022    8:50 AM 06/15/2022   12:55 PM 12/28/2021    8:50 AM 07/31/2020    8:36 AM 01/25/2019    8:37 AM  Depression screen PHQ 2/9  Decreased Interest 0 0 0 0 0  Down, Depressed, Hopeless 0 0 0 0 0  PHQ - 2 Score 0 0 0 0 0    Allergies  Allergen Reactions   Penicillins Nausea Only and Rash    Has patient had a PCN reaction causing immediate rash, facial/tongue/throat swelling, SOB or lightheadedness with hypotension: yES Has patient had a PCN reaction causing severe rash involving mucus membranes or skin necrosis: No Has patient had a PCN reaction that required hospitalization: No Has patient had a PCN reaction occurring within the last 10 years: No If all of the above answers are "NO", then may proceed with Cephalosporin use. CC   Social History   Social History Narrative   Married, 4 grown children.   Relocated to [place], [place] from [place] about 2012.   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   Exercise: intermittently does water aerobics..            Past Medical History:  Diagnosis Date   BPPV (benign paroxysmal positional vertigo) 01/30/2014   CAP (community acquired pneumonia) 10/2018   with acute hypoxic RF, empyema-->VATS   Cyst, breast 01/2012; 06/2012; 4/0/98; 12/26/89   Complicated cysts in upper outer left breast--no evidence of malignancy--f/u bilat diag mammo/left breast u/s on 03/06/15 showed resolution of left breast cysts.  Repeat screening mammogram 1 yr.   [place]yema ([place])    drained by CT surgery/VATS.  Full recovery as of 12/2018 CT surgery f/u visit.   H/O allergic rhinitis    Dr. Ishmael Holter   Hepatic cyst 2019   Noted on CT chest imaging.  Multiple, small, "simple"   History of pneumonia 2009 and 2010   supplemential oxygen d/c'd 12/16/18 by pulm   Mild persistent asthma, well controlled 2014   New adult onset asthma after respiratory infection (Dr. Ishmael Holter)   Morbid obesity ([place])    BMI 50+.  At one point in time she was going to Bariatric clinic on [address] and got weekly HCG injections, monthly vit B12 injections, and took  phentermine daily.  As of 05/2015 she was no longer doing this.   Nephrolithiasis 04/2018 first episode   Dr. [facility] Neighbours ([facility]): 1.55m right UVJ stone + 6 mm L AML.  Pt elected for trial of passage.   OSA (obstructive sleep apnea) 10/2018   Mild/mod OSA 12/2018--pt not able to afford CPAP as of 01/2019.   Osteoarthritis of both knees    No improvement with steroid injections; did synvisc x 3 yrs; bilat replacement was recommended but she declined.   Prediabetes    she's on metformin   Pulmonary nodule 06/29/2021   on lung ca screening CT->repeat 6 mo   Recurrent low back pain    Thyroid nodule 03/2022   1 on each side, needs rpt u/s 03/2023   Past Surgical History:  Procedure Laterality  Date   [place] 2010   Normal coronaries per pt   CARDIOVASCULAR STRESS TEST  2010   abnl per pt; f/u cath clean   CESAREAN SECTION     x 3   CHOLECYSTECTOMY  1986   COLONOSCOPY  2011   lipoma and small diminutive polyp in rectum (in [place])-->[facility] to do repeat as of 04/05/21   COLONOSCOPY  06/15/2021   Normal-recall 10 yrs   COLONOSCOPY WITH PROPOFOL N/A 06/15/2021   Procedure: COLONOSCOPY WITH PROPOFOL;  Surgeon: BOtis Brace MD;  [facility];  Service: Gastroenterology;  Laterality: N/A;   DEXA  06/2021   06/2021 NORMAL-> rpt 2 yrs.   EMPYEMA DRAINAGE Left 10/24/2018   Evacuation of empyema with decortication.  Procedure: EMPYEMA DRAINAGE;  Surgeon: GGrace Isaac MD;  [facility]  Service: Thoracic;  Laterality: Left;   LUMBAR DISC SURGERY  1993   L5/S1   TONSILLECTOMY AND ADENOIDECTOMY  1967ish   TOTAL ABDOMINAL HYSTERECTOMY  1994   Ovaries are still in.  This was done for DUB/fibroids.   VIDEO ASSISTED THORACOSCOPY (VATS)/THOROCOTOMY Left 10/24/2018   Procedure: VIDEO ASSISTED THORACOSCOPY (VATS)/THOROCOTOMY with EVACUATION OF EMPYEMA AND DECORTICATION.;  Surgeon: GGrace Isaac MD;  [facility];  Service: Thoracic;  Laterality: Left;   VIDEO BRONCHOSCOPY N/A 10/24/2018   Procedure: VIDEO BRONCHOSCOPY;  Surgeon: GGrace Isaac MD;  [facility];  Service: Thoracic;  Laterality: N/A;   Family History  Problem Relation Age of Onset   Arthritis Mother    Heart disease Mother 658  Hypertension Mother    Diabetes Mother    Colon cancer Mother        632's  Arthritis Paternal Grandmother    Breast cancer Neg Hx    Allergies as of 11/21/2022       Reactions   Penicillins Nausea Only, Rash   Has patient had a PCN reaction causing immediate rash, facial/tongue/throat swelling, SOB or lightheadedness with hypotension: yES Has patient had a PCN reaction causing severe rash involving mucus membranes  or skin necrosis: No Has patient had a PCN reaction that required hospitalization: No Has patient had a PCN reaction occurring within the last 10 years: No If all of the above answers are "NO", then may proceed with Cephalosporin use. CC        Medication List        Accurate as of November 21, 2022 12:35 PM. If you have any questions, ask your nurse or doctor.          albuterol 108 (90 Base) MCG/ACT inhaler Commonly known as: VENTOLIN HFA Inhale 2 puffs  into the lungs every 6 (six) hours as needed for wheezing or shortness of breath.   ALLEGRA PO Take by mouth daily.   benzonatate 100 MG capsule Commonly known as: TESSALON Take 2 capsules (200 mg total) by mouth 2 (two) times daily as needed for cough. Started by: Howard Pouch, DO   cholecalciferol 25 MCG (1000 UNIT) tablet Commonly known as: VITAMIN D3 Take 1,000 Units by mouth daily.   COLLAGEN PO Take by mouth.   doxycycline 100 MG tablet Commonly known as: VIBRA-TABS Take 1 tablet (100 mg total) by mouth 2 (two) times daily. Started by: Howard Pouch, DO   fluticasone 50 MCG/ACT nasal spray Commonly known as: FLONASE Place 1 spray into both nostrils daily.   glucose blood test strip Commonly known as: Contour Next Test Use to check blood sugar once daily   OneTouch Ultra test strip Generic drug: glucose blood USE TO CHECK BLOOD SUGAR ONCE DAILY   magnesium gluconate 500 MG tablet Commonly known as: MAGONATE Take 500 mg by mouth daily.   metFORMIN 1000 MG tablet Commonly known as: GLUCOPHAGE Take 1 tablet (1,000 mg total) by mouth 2 (two) times daily with a meal.   multivitamin tablet Take 1 tablet by mouth daily.   OneTouch Delica Plus [id number] Misc USE TO CHECK BLOOD SUGAR ONCE DAILY   OVER THE COUNTER MEDICATION daily. GOLO   predniSONE 20 MG tablet Commonly known as: DELTASONE 60 mg x2d, 40 mg x3d, 20 mg x3d, 10 mg x2d Started by: Howard Pouch, DO   Qvar RediHaler 40 MCG/ACT  inhaler Generic drug: beclomethasone Inhale 2 puffs into the lungs 2 (two) times daily.   vitamin C 1000 MG tablet Take 1,000 mg by mouth daily.        All past medical history, surgical history, allergies, family history, immunizations andmedications were updated in the EMR today and reviewed under the history and medication portions of their EMR.     ROS Negative, with the exception of above mentioned in HPI   Objective:  BP 90/61   Pulse 82   Temp 98.5 F (36.9 C)   SpO2 94%  There is no height or weight on file to calculate BMI.  Physical Exam Vitals and nursing note reviewed.  Constitutional:      General: She is not in acute distress.    Appearance: Normal appearance. She is normal weight. She is not ill-appearing or toxic-appearing.  HENT:     Head: Normocephalic and atraumatic.     Right Ear: Tympanic membrane and ear canal normal.     Left Ear: Tympanic membrane and ear canal normal.     Nose: Congestion and rhinorrhea present.     Mouth/Throat:     Mouth: Mucous membranes are moist.     Pharynx: Posterior oropharyngeal erythema present. No oropharyngeal exudate.     Comments: Postnasal drip Eyes:     General: No scleral icterus.       Right eye: No discharge.        Left eye: No discharge.     Extraocular Movements: Extraocular movements intact.     Conjunctiva/sclera: Conjunctivae normal.     Pupils: Pupils are equal, round, and reactive to light.  Cardiovascular:     Rate and Rhythm: Normal rate and regular rhythm.     Heart sounds: No murmur heard. Pulmonary:     Effort: No respiratory distress.     Breath sounds: Decreased air movement present. Rhonchi present. No wheezing or rales.  Comments: Cough present Musculoskeletal:     Cervical back: Neck supple.     Right lower leg: No edema.     Left lower leg: No edema.  Lymphadenopathy:     Cervical: No cervical adenopathy.  Skin:    Findings: No rash.  Neurological:     Mental Status: She is  alert and oriented to person, place, and time. Mental status is at baseline.  Psychiatric:        Mood and Affect: Mood normal.        Behavior: Behavior normal.        Thought Content: Thought content normal.        Judgment: Judgment normal.    No results found. No results found. Results for orders placed or performed in visit on 11/21/22 (from the past 24 hour(s))  POCT Influenza A/B     Status: None   Collection Time: 11/21/22 11:33 AM  Result Value Ref Range   Influenza A, POC Negative Negative   Influenza B, POC Negative Negative  POC COVID-19 BinaxNow     Status: None   Collection Time: 11/21/22 11:33 AM  Result Value Ref Range   SARS Coronavirus 2 Ag Negative Negative    Assessment/Plan: Daveda Larock is a 66 y.o. female present for OV for  cough/bacterial respiratory infection Point of care testing is negative today for influenza and COVID Lung exam revealed decreased air movement bilateral fields with bronchospasm cough.  DuoNeb treatment x 1 was provided in office which opened airways greatly. Rest, hydrate.  mucinex (DM if cough), nettie pot or nasal saline.  Tessalon Perles prescribed with cough. Patient was encouraged to use her albuterol inhaler every 6 hours as needed for chest tightness, wheezing or cough Doxycycline twice daily and prednisone taper prescribed, take until completed. Follow-up in 2 weeks as needed with PCP   Reviewed expectations re: course of current medical issues. Discussed self-management of symptoms. Outlined signs and symptoms indicating need for more acute intervention. Patient verbalized understanding and all questions were answered. Patient received an After-Visit Summary.    Orders Placed This Encounter  Procedures   POCT Influenza A/B   POC COVID-19 BinaxNow   Meds ordered this encounter  Medications   benzonatate (TESSALON) 100 MG capsule    Sig: Take 2 capsules (200 mg total) by mouth 2 (two) times daily as needed for cough.     Dispense:  20 capsule    Refill:  0   doxycycline (VIBRA-TABS) 100 MG tablet    Sig: Take 1 tablet (100 mg total) by mouth 2 (two) times daily.    Dispense:  20 tablet    Refill:  0   predniSONE (DELTASONE) 20 MG tablet    Sig: 60 mg x2d, 40 mg x3d, 20 mg x3d, 10 mg x2d    Dispense:  16 tablet    Refill:  0   ipratropium-albuterol (DUONEB) 0.5-2.5 (3) MG/3ML nebulizer solution 3 mL   Referral Orders  No referral(s) requested today     Note is dictated utilizing voice recognition software. Although note has been proof read prior to signing, occasional typographical errors still can be missed. If any questions arise, please do not hesitate to call for verification.   electronically signed by:  Howard Pouch, DO  [place]

## 2022-12-05 ENCOUNTER — Ambulatory Visit (INDEPENDENT_AMBULATORY_CARE_PROVIDER_SITE_OTHER): Payer: Medicare Other | Admitting: Family Medicine

## 2022-12-05 ENCOUNTER — Encounter: Payer: Self-pay | Admitting: Family Medicine

## 2022-12-05 VITALS — BP 129/79 | HR 64 | Temp 98.4°F

## 2022-12-05 DIAGNOSIS — H00025 Hordeolum internum left lower eyelid: Secondary | ICD-10-CM | POA: Diagnosis not present

## 2022-12-05 DIAGNOSIS — J4530 Mild persistent asthma, uncomplicated: Secondary | ICD-10-CM

## 2022-12-05 DIAGNOSIS — J4531 Mild persistent asthma with (acute) exacerbation: Secondary | ICD-10-CM

## 2022-12-05 MED ORDER — ERYTHROMYCIN 5 MG/GM OP OINT: 1.0000 | TOPICAL_OINTMENT | Freq: Three times a day (TID) | OPHTHALMIC | 0 refills | Status: DC

## 2022-12-05 NOTE — Progress Notes (Signed)
OFFICE VISIT  12/05/2022  CC:  Chief Complaint  Patient presents with   URI   Patient is a 66 y.o. female who presents for follow-up recent respiratory illness. On 11/21/2022 she was seen here and diagnosed with acute bacterial bronchitis.  Prescribed doxycycline and prednisone 10d; 60-40-20-10.  INTERIM HX: Taylor Leon is much better, essentially back to her baseline state of health. Did not need any albuterol for this illness. She had some swelling and redness under the left eye at the time of this recent illness but apparently this is all resolved.  However she noted a little bump on the inside of the left lower lid today.  No eye drainage or redness or pain.  Past Medical History:  Diagnosis Date   BPPV (benign paroxysmal positional vertigo) 01/30/2014   CAP (community acquired pneumonia) 10/2018   with acute hypoxic RF, empyema-->VATS   Cyst, breast 01/2012; 06/2012; 05/27/59; 06/27/70   Complicated cysts in upper outer left breast--no evidence of malignancy--f/u bilat diag mammo/left breast u/s on 03/06/15 showed resolution of left breast cysts.  Repeat screening mammogram 1 yr.   Empyema ([place])    drained by CT surgery/VATS.  Full recovery as of 12/2018 CT surgery f/u visit.   H/O allergic rhinitis    Dr. Ishmael Holter   Hepatic cyst 2019   Noted on CT chest imaging.  Multiple, small, "simple"   History of pneumonia 2009 and 2010   supplemential oxygen d/c'd 12/16/18 by pulm   Mild persistent asthma, well controlled 2014   New adult onset asthma after respiratory infection (Dr. Ishmael Holter)   Morbid obesity ([place])    BMI 50+.  At one point in time she was going to Bariatric clinic on [address] and got weekly HCG injections, monthly vit B12 injections, and took phentermine daily.  As of 05/2015 she was no longer doing this.   Nephrolithiasis 04/2018 first episode   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  Pt elected for trial of passage.   OSA (obstructive sleep apnea) 10/2018    Mild/mod OSA 12/2018--pt not able to afford CPAP as of 01/2019.   Osteoarthritis of both knees    No improvement with steroid injections; did synvisc x 3 yrs; bilat replacement was recommended but she declined.   Prediabetes    she's on metformin   Pulmonary nodule 06/29/2021   on lung ca screening CT->repeat 6 mo   Recurrent low back pain    Thyroid nodule 03/2022   1 on each side, needs rpt u/s 03/2023    Past Surgical History:  Procedure Laterality Date   [facility]   Normal coronaries per pt   CARDIOVASCULAR STRESS TEST  2010   abnl per pt; f/u cath clean   CESAREAN SECTION     x 3   CHOLECYSTECTOMY  1986   COLONOSCOPY  2011   lipoma and small diminutive polyp in rectum (in [place])-->[facility] to do repeat as of 04/05/21   COLONOSCOPY  06/15/2021   Normal-recall 10 yrs   COLONOSCOPY WITH PROPOFOL N/A 06/15/2021   Procedure: COLONOSCOPY WITH PROPOFOL;  Surgeon: BOtis Brace MD;  [facility];  Service: Gastroenterology;  Laterality: N/A;   DEXA  06/2021   06/2021 NORMAL-> rpt 2 yrs.   EMPYEMA DRAINAGE Left 10/24/2018   Evacuation of empyema with decortication.  Procedure: EMPYEMA DRAINAGE;  Surgeon: GGrace Isaac MD;  [facility] [facility]  Service: Thoracic;  Laterality:  Left;   LUMBAR DISC SURGERY  1993   L5/S1   TONSILLECTOMY AND ADENOIDECTOMY  1967ish   TOTAL ABDOMINAL HYSTERECTOMY  1994   Ovaries are still in.  This was done for DUB/fibroids.   VIDEO ASSISTED THORACOSCOPY (VATS)/THOROCOTOMY Left 10/24/2018   Procedure: VIDEO ASSISTED THORACOSCOPY (VATS)/THOROCOTOMY with EVACUATION OF EMPYEMA AND DECORTICATION.;  Surgeon: Grace Isaac, MD;  Location: [place];  Service: Thoracic;  Laterality: Left;   VIDEO BRONCHOSCOPY N/A 10/24/2018   Procedure: VIDEO BRONCHOSCOPY;  Surgeon: Grace Isaac, MD;  [facility];  Service: Thoracic;  Laterality: N/A;    Outpatient Medications Prior to Visit  Medication Sig  Dispense Refill   albuterol (VENTOLIN HFA) 108 (90 Base) MCG/ACT inhaler Inhale 2 puffs into the lungs every 6 (six) hours as needed for wheezing or shortness of breath. 8 g 6   Ascorbic Acid (VITAMIN C) 1000 MG tablet Take 1,000 mg by mouth daily.     beclomethasone (QVAR REDIHALER) 40 MCG/ACT inhaler Inhale 2 puffs into the lungs 2 (two) times daily. 1 each 1   cholecalciferol (VITAMIN D) 25 MCG (1000 UNIT) tablet Take 1,000 Units by mouth daily.     COLLAGEN PO Take by mouth.     Fexofenadine HCl (ALLEGRA PO) Take by mouth daily.     fluticasone (FLONASE) 50 MCG/ACT nasal spray Place 1 spray into both nostrils daily. 16 g 2   glucose blood (CONTOUR NEXT TEST) test strip Use to check blood sugar once daily 100 each 11   Lancets (ONETOUCH DELICA PLUS [id number]) MISC USE TO CHECK BLOOD SUGAR ONCE DAILY 100 each 12   magnesium gluconate (MAGONATE) 500 MG tablet Take 500 mg by mouth daily.     metFORMIN (GLUCOPHAGE) 1000 MG tablet Take 1 tablet (1,000 mg total) by mouth 2 (two) times daily with a meal. 180 tablet 3   Multiple Vitamin (MULTIVITAMIN) tablet Take 1 tablet by mouth daily.     ONETOUCH ULTRA test strip USE TO CHECK BLOOD SUGAR ONCE DAILY 100 strip 12   OVER THE COUNTER MEDICATION daily. GOLO     benzonatate (TESSALON) 100 MG capsule Take 2 capsules (200 mg total) by mouth 2 (two) times daily as needed for cough. 20 capsule 0   doxycycline (VIBRA-TABS) 100 MG tablet Take 1 tablet (100 mg total) by mouth 2 (two) times daily. 20 tablet 0   predniSONE (DELTASONE) 20 MG tablet 60 mg x2d, 40 mg x3d, 20 mg x3d, 10 mg x2d 16 tablet 0   No facility-administered medications prior to visit.    Allergies  Allergen Reactions   Penicillins Nausea Only and Rash    Has patient had a PCN reaction causing immediate rash, facial/tongue/throat swelling, SOB or lightheadedness with hypotension: yES Has patient had a PCN reaction causing severe rash involving mucus membranes or skin necrosis: No Has  patient had a PCN reaction that required hospitalization: No Has patient had a PCN reaction occurring within the last 10 years: No If all of the above answers are "NO", then may proceed with Cephalosporin use. CC    ROS As per HPI  PE:    12/05/2022    2:13 PM 11/21/2022   11:02 AM 10/07/2022    8:20 AM  Vitals with BMI  Height   '5\' 7"'$   Weight   338 lbs 13 oz  BMI   74.25  Systolic 956 90 387  Diastolic 79 61 66  Pulse 64 82 64    Physical Exam  Gen:  Alert, well appearing.  Patient is oriented to person, place, time, and situation. AFFECT: pleasant, lucid thought and speech. Left lower eyelid with internal small papule at lash base. Mild palpebral conjunctival injection around this lesion. No bulbar conjunctiva lesion.  No generalized lid swelling or erythema. Extraocular movements intact, PERRL. Lungs are clear bilaterally, breathing nonlabored, aeration good. Cardiovascular exam shows regular rhythm and rate without murmur.  LABS:  Last CBC Lab Results  Component Value Date   WBC 7.7 12/28/2021   HGB 12.7 12/28/2021   HCT 40.4 12/28/2021   MCV 85.9 12/28/2021   MCH 26.3 10/31/2018   RDW 16.0 (H) 12/28/2021   PLT 274.0 38/25/0539   Last metabolic panel Lab Results  Component Value Date   GLUCOSE 99 10/07/2022   NA 143 10/07/2022   K 4.7 10/07/2022   CL 109 10/07/2022   CO2 28 10/07/2022   BUN 13 10/07/2022   CREATININE 0.83 10/07/2022   GFRNONAA >60 10/30/2018   CALCIUM 9.5 10/07/2022   PHOS 2.8 10/26/2018   PROT 6.6 12/28/2021   ALBUMIN 3.8 12/28/2021   BILITOT 0.5 12/28/2021   ALKPHOS 63 12/28/2021   AST 15 12/28/2021   ALT 13 12/28/2021   ANIONGAP 7 10/30/2018   IMPRESSION AND PLAN:  #1 URI with acute bronchitis/acute asthma exacerbation. Resolved. Continue Qvar daily and she has albuterol to use as needed. Recommended RSV vaccine.  2.  Left lower lid hordeolum. Warm towel baby shampoo soaks recommended. Erythromycin ointment 3 times  daily x 7 days prescribed.  An After Visit Summary was printed and given to the patient.  FOLLOW UP: No follow-ups on file.  Signed:  Crissie Sickles, MD           12/05/2022

## 2023-01-18 ENCOUNTER — Other Ambulatory Visit: Payer: Self-pay | Admitting: Family Medicine

## 2023-01-18 DIAGNOSIS — Z1231 Encounter for screening mammogram for malignant neoplasm of breast: Secondary | ICD-10-CM

## 2023-01-26 ENCOUNTER — Encounter: Payer: Self-pay | Admitting: Family Medicine

## 2023-01-26 ENCOUNTER — Ambulatory Visit (INDEPENDENT_AMBULATORY_CARE_PROVIDER_SITE_OTHER): Payer: Medicare Other | Admitting: Family Medicine

## 2023-01-26 VITALS — BP 106/71 | HR 68 | Temp 98.0°F | Ht 67.0 in | Wt 338.0 lb

## 2023-01-26 DIAGNOSIS — Z0000 Encounter for general adult medical examination without abnormal findings: Secondary | ICD-10-CM | POA: Diagnosis not present

## 2023-01-26 DIAGNOSIS — R7303 Prediabetes: Secondary | ICD-10-CM

## 2023-01-26 LAB — LIPID PANEL
Cholesterol: 186 mg/dL (ref 0–200)
HDL: 83.9 mg/dL (ref >39.00)
LDL Cholesterol: 82 mg/dL (ref 0–99)
NonHDL: 101.88
Total CHOL/HDL Ratio: 2
Triglycerides: 98 mg/dL (ref 0.0–149.0)
VLDL: 19.6 mg/dL (ref 0.0–40.0)

## 2023-01-26 LAB — COMPREHENSIVE METABOLIC PANEL
ALT: 12 U/L (ref 0–35)
AST: 14 U/L (ref 0–37)
Albumin: 4 g/dL (ref 3.5–5.2)
Alkaline Phosphatase: 66 U/L (ref 39–117)
BUN: 15 mg/dL (ref 6–23)
CO2: 31 mEq/L (ref 19–32)
Calcium: 9.5 mg/dL (ref 8.4–10.5)
Chloride: 107 mEq/L (ref 96–112)
Creatinine, Ser: 0.85 mg/dL (ref 0.40–1.20)
GFR: 71.11 mL/min (ref >60.00)
Glucose, Bld: 106 mg/dL — ABNORMAL HIGH (ref 70–99)
Potassium: 4.3 mEq/L (ref 3.5–5.1)
Sodium: 144 mEq/L (ref 135–145)
Total Bilirubin: 0.4 mg/dL (ref 0.2–1.2)
Total Protein: 6.5 g/dL (ref 6.0–8.3)

## 2023-01-26 LAB — POCT GLYCOSYLATED HEMOGLOBIN (HGB A1C)
HbA1c POC (<> result, manual entry): 5.9 % (ref 4.0–5.6)
HbA1c, POC (controlled diabetic range): 5.9 % (ref 0.0–7.0)
HbA1c, POC (prediabetic range): 5.9 % (ref 5.7–6.4)
Hemoglobin A1C: 5.9 % — AB (ref 4.0–5.6)

## 2023-01-26 LAB — MICROALBUMIN / CREATININE URINE RATIO
Creatinine,U: 87.9 mg/dL
Microalb Creat Ratio: 0.8 mg/g (ref 0.0–30.0)
Microalb, Ur: <0.7 mg/dL (ref 0.0–1.9)

## 2023-01-26 LAB — CBC
HCT: 39.7 % (ref 36.0–46.0)
Hemoglobin: 12.9 g/dL (ref 12.0–15.0)
MCHC: 32.4 g/dL (ref 30.0–36.0)
MCV: 85.6 fl (ref 78.0–100.0)
Platelets: 288 10*3/uL (ref 150.0–400.0)
RBC: 4.64 Mil/uL (ref 3.87–5.11)
RDW: 15.7 % — ABNORMAL HIGH (ref 11.5–15.5)
WBC: 9.3 10*3/uL (ref 4.0–10.5)

## 2023-01-26 LAB — TSH: TSH: 1.35 u[IU]/mL (ref 0.35–5.50)

## 2023-01-26 NOTE — Progress Notes (Signed)
Office Note 01/26/2023  CC:  Chief Complaint  Patient presents with   Annual Exam    HPI:  Patient is a 67 y.o. female who is here for annual health maintenance exam and follow-up borderline diabetes.  She is under extreme stress with taking care of her chronically ill husband, esp worse the last 3 mo since he got dx'd with lymphoma and has been on chemo. She is coping fairly well, though.  Has great family and friends support system.   Past Medical History:  Diagnosis Date   BPPV (benign paroxysmal positional vertigo) 01/30/2014   CAP (community acquired pneumonia) 10/2018   with acute hypoxic RF, empyema-->VATS   Cyst, breast 01/2012; 06/2012; 08/28/25; 06/26/23   Complicated cysts in upper outer left breast--no evidence of malignancy--f/u bilat diag mammo/left breast u/s on 03/06/15 showed resolution of left breast cysts.  Repeat screening mammogram 1 yr.   Empyema ([place])    drained by CT surgery/VATS.  Full recovery as of 12/2018 CT surgery f/u visit.   H/O allergic rhinitis    Dr. Ishmael Holter   Hepatic cyst 2019   Noted on CT chest imaging.  Multiple, small, "simple"   History of pneumonia 2009 and 2010   supplemential oxygen d/c'd 12/16/18 by pulm   Mild persistent asthma, well controlled 2014   New adult onset asthma after respiratory infection (Dr. Ishmael Holter)   Morbid obesity ([place])    BMI 50+.  At one point in time she was going to Bariatric clinic on [address] and got weekly HCG injections, monthly vit B12 injections, and took phentermine daily.  As of 05/2015 she was no longer doing this.   Nephrolithiasis 04/2018 first episode   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  Pt elected for trial of passage.   OSA (obstructive sleep apnea) 10/2018   Mild/mod OSA 12/2018--pt not able to afford CPAP as of 01/2019.   Osteoarthritis of both knees    No improvement with steroid injections; did synvisc x 3 yrs; bilat replacement was recommended but she declined.    Prediabetes    she's on metformin   Pulmonary nodule 06/29/2021   on lung ca screening CT->repeat 6 mo   Recurrent low back pain    Thyroid nodule 03/2022   1 on each side, needs rpt u/s 03/2023    Past Surgical History:  Procedure Laterality Date   [place]   Normal coronaries per pt   CARDIOVASCULAR STRESS TEST  2010   abnl per pt; f/u cath clean   CESAREAN SECTION     x 3   CHOLECYSTECTOMY  1986   COLONOSCOPY  2011   lipoma and small diminutive polyp in rectum (in [place])-->[facility] to do repeat as of 04/05/21   COLONOSCOPY  06/15/2021   Normal-recall 10 yrs   COLONOSCOPY WITH PROPOFOL N/A 06/15/2021   Procedure: COLONOSCOPY WITH PROPOFOL;  Surgeon: BOtis Brace MD;  [facility];  Service: Gastroenterology;  Laterality: N/A;   DEXA  06/2021   06/2021 NORMAL-> rpt 2 yrs.   EMPYEMA DRAINAGE Left 10/24/2018   Evacuation of empyema with decortication.  Procedure: EMPYEMA DRAINAGE;  Surgeon: GGrace Isaac MD;  [place] [facility]  Service: Thoracic;  Laterality: Left;   LUMBAR DISC SURGERY  1993   L5/S1   TONSILLECTOMY AND ADENOIDECTOMY  1967ish   TOTAL ABDOMINAL HYSTERECTOMY  1994   Ovaries are still in.  This was done  for DUB/fibroids.   VIDEO ASSISTED THORACOSCOPY (VATS)/THOROCOTOMY Left 10/24/2018   Procedure: VIDEO ASSISTED THORACOSCOPY (VATS)/THOROCOTOMY with EVACUATION OF EMPYEMA AND DECORTICATION.;  Surgeon: Grace Isaac, MD;  Location: [place] [facility];  Service: Thoracic;  Laterality: Left;   VIDEO BRONCHOSCOPY N/A 10/24/2018   Procedure: VIDEO BRONCHOSCOPY;  Surgeon: Grace Isaac, MD;  [facility];  Service: Thoracic;  Laterality: N/A;    Family History  Problem Relation Age of Onset   Arthritis Mother    Heart disease Mother 73   Hypertension Mother    Diabetes Mother    Colon cancer Mother        109's   Arthritis Paternal Grandmother    Breast cancer Neg Hx     Social History   Socioeconomic  History   Marital status: Married    Spouse name: Not on file   Number of children: 4   Years of education: Not on file   Highest education level: Not on file  Occupational History   Not on file  Tobacco Use   Smoking status: Former    Packs/day: 0.75    Years: 34.00    Total pack years: 25.50    Types: Cigarettes    Start date: 08/1976    Quit date: 12/28/2009    Years since quitting: 13.0   Smokeless tobacco: Never  Vaping Use   Vaping Use: Never used  Substance and Sexual Activity   Alcohol use: Yes    Comment: social   Drug use: No   Sexual activity: Not on file  Other Topics Concern   Not on file  Social History Narrative   Married, 4 grown children.   Relocated to [place], [place] from [place] about 2012.   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   Exercise: intermittently does water aerobics..            Social Determinants of Health   Financial Resource Strain: Low Risk  (06/15/2022)   Overall Financial Resource Strain (CARDIA)    Difficulty of Paying Living Expenses: Not hard at all  Food Insecurity: No Food Insecurity (06/15/2022)   Hunger Vital Sign    Worried About Running Out of Food in the Last Year: Never true    Ran Out of Food in the Last Year: Never true  Transportation Needs: No Transportation Needs (06/15/2022)   PRAPARE - Hydrologist (Medical): No    Lack of Transportation (Non-Medical): No  Physical Activity: Inactive (06/15/2022)   Exercise Vital Sign    Days of Exercise per Week: 0 days    Minutes of Exercise per Session: 0 min  Stress: No Stress Concern Present (06/15/2022)   [place]    Feeling of Stress : Not at all  Social Connections: Moderately Integrated (06/15/2022)   Social Connection and Isolation Panel [NHANES]    Frequency of Communication with Friends and Family: More  than three times a week    Frequency of Social Gatherings with Friends and Family: More than three times a week    Attends Religious Services: More than 4 times per year    Active Member of [employer] or Organizations: No    Attends Archivist Meetings: Never    Marital Status: Married  Human resources officer Violence: Not At Risk (06/15/2022)   Humiliation, Afraid, Rape, and Kick questionnaire    Fear of Current  or Ex-Partner: No    Emotionally Abused: No    Physically Abused: No    Sexually Abused: No    Outpatient Medications Prior to Visit  Medication Sig Dispense Refill   Ascorbic Acid (VITAMIN C) 1000 MG tablet Take 1,000 mg by mouth daily.     cholecalciferol (VITAMIN D) 25 MCG (1000 UNIT) tablet Take 1,000 Units by mouth daily.     COLLAGEN PO Take by mouth.     Fexofenadine HCl (ALLEGRA PO) Take by mouth daily.     fluticasone (FLONASE) 50 MCG/ACT nasal spray Place 1 spray into both nostrils daily. 16 g 2   glucose blood (CONTOUR NEXT TEST) test strip Use to check blood sugar once daily 100 each 11   Lancets (ONETOUCH DELICA PLUS [id number]) MISC USE TO CHECK BLOOD SUGAR ONCE DAILY 100 each 12   magnesium gluconate (MAGONATE) 500 MG tablet Take 500 mg by mouth daily.     metFORMIN (GLUCOPHAGE) 1000 MG tablet Take 1 tablet (1,000 mg total) by mouth 2 (two) times daily with a meal. 180 tablet 3   Multiple Vitamin (MULTIVITAMIN) tablet Take 1 tablet by mouth daily.     ONETOUCH ULTRA test strip USE TO CHECK BLOOD SUGAR ONCE DAILY 100 strip 12   OVER THE COUNTER MEDICATION daily. GOLO     albuterol (VENTOLIN HFA) 108 (90 Base) MCG/ACT inhaler Inhale 2 puffs into the lungs every 6 (six) hours as needed for wheezing or shortness of breath. (Patient not taking: Reported on 01/26/2023) 8 g 6   beclomethasone (QVAR REDIHALER) 40 MCG/ACT inhaler Inhale 2 puffs into the lungs 2 (two) times daily. (Patient not taking: Reported on 01/26/2023) 1 each 1   erythromycin ophthalmic ointment Place 1  Application into the left eye 3 (three) times daily. (Patient not taking: Reported on 01/26/2023) 3.5 g 0   No facility-administered medications prior to visit.    Allergies  Allergen Reactions   Penicillins Nausea Only and Rash    Has patient had a PCN reaction causing immediate rash, facial/tongue/throat swelling, SOB or lightheadedness with hypotension: yES Has patient had a PCN reaction causing severe rash involving mucus membranes or skin necrosis: No Has patient had a PCN reaction that required hospitalization: No Has patient had a PCN reaction occurring within the last 10 years: No If all of the above answers are "NO", then may proceed with Cephalosporin use. CC    Review of Systems  Constitutional:  Negative for appetite change, chills, fatigue and fever.  HENT:  Negative for congestion, dental problem, ear pain and sore throat.   Eyes:  Negative for discharge, redness and visual disturbance.  Respiratory:  Negative for cough, chest tightness, shortness of breath and wheezing.   Cardiovascular:  Negative for chest pain, palpitations and leg swelling.  Gastrointestinal:  Negative for abdominal pain, blood in stool, diarrhea, nausea and vomiting.  Genitourinary:  Negative for difficulty urinating, dysuria, flank pain, frequency, hematuria and urgency.  Musculoskeletal:  Positive for arthralgias (bilat knee osteoarthritis). Negative for back pain, joint swelling, myalgias and neck stiffness.  Skin:  Negative for pallor and rash.  Neurological:  Negative for dizziness, speech difficulty, weakness and headaches.  Hematological:  Negative for adenopathy. Does not bruise/bleed easily.  Psychiatric/Behavioral:  Positive for dysphoric mood. Negative for confusion and sleep disturbance. The patient is nervous/anxious.     PE;    01/26/2023    8:05 AM 12/05/2022    2:13 PM 11/21/2022   11:02 AM  Vitals with BMI  Height '5\' 7"'$     Weight 338 lbs    BMI 68.12    Systolic 751 700 90   Diastolic 71 79 61  Pulse 68 64 82   Exam chaperoned by Shepard General, CMA Gen: Alert, well appearing.  Patient is oriented to person, place, time, and situation. AFFECT: pleasant, lucid thought and speech. ENT: Ears: EACs clear, normal epithelium.  TMs with good light reflex and landmarks bilaterally.  Eyes: no injection, icteris, swelling, or exudate.  EOMI, PERRLA. Nose: no drainage or turbinate edema/swelling.  No injection or focal lesion.  Mouth: lips without lesion/swelling.  Oral mucosa pink and moist.  Dentition intact and without obvious caries or gingival swelling.  Oropharynx without erythema, exudate, or swelling.  Neck: supple/nontender.  No LAD, mass, or TM.  Carotid pulses 2+ bilaterally, without bruits. CV: RRR, no m/r/g.   LUNGS: CTA bilat, nonlabored resps, good aeration in all lung fields. ABD: soft, NT, ND, BS normal.  No hepatospenomegaly or mass.  No bruits. EXT: no clubbing, cyanosis, or edema.  Musculoskeletal: no joint swelling, erythema, warmth, or tenderness.  ROM of all joints intact. Skin - no sores or suspicious lesions or rashes or color changes  Pertinent labs:  Lab Results  Component Value Date   TSH 1.60 12/28/2021   Lab Results  Component Value Date   WBC 7.7 12/28/2021   HGB 12.7 12/28/2021   HCT 40.4 12/28/2021   MCV 85.9 12/28/2021   PLT 274.0 12/28/2021   Lab Results  Component Value Date   CREATININE 0.83 10/07/2022   BUN 13 10/07/2022   NA 143 10/07/2022   K 4.7 10/07/2022   CL 109 10/07/2022   CO2 28 10/07/2022   Lab Results  Component Value Date   ALT 13 12/28/2021   AST 15 12/28/2021   ALKPHOS 63 12/28/2021   BILITOT 0.5 12/28/2021   Lab Results  Component Value Date   CHOL 175 10/07/2022   Lab Results  Component Value Date   HDL 82.20 10/07/2022   Lab Results  Component Value Date   LDLCALC 69 10/07/2022   Lab Results  Component Value Date   TRIG 119.0 10/07/2022   Lab Results  Component Value Date    CHOLHDL 2 10/07/2022   Lab Results  Component Value Date   HGBA1C 5.9 (A) 01/26/2023   HGBA1C 5.9 01/26/2023   HGBA1C 5.9 01/26/2023   HGBA1C 5.9 01/26/2023   ASSESSMENT AND PLAN:   No problem-specific Assessment & Plan notes found for this encounter.  Borderline DM: Doing well on metformin 1000 twice daily. POC Hba1c today is 5.9%---same as last time. Feet normal today. Urine microalb/cr today.   2) Lung nodule on screening CT 06/29/21-->repeat CT 02/10/2022 showed stability.  She is followed by pulmonology.  3) Health maintenance exam: Reviewed age and gender appropriate health maintenance issues (prudent diet, regular exercise, health risks of tobacco and excessive alcohol, use of seatbelts, fire alarms in home, use of sunscreen).  Also reviewed age and gender appropriate health screening as well as vaccine recommendations. Vaccines: all UTD. Labs: fasting HP ordered. Cervical ca screening: n/a-->pt with hysterectomy for benign dx. Breast ca screening: mammogram is scheduled for 03/07/2023 Colon ca screening: recall 2032. Osteoporosis screening: DEXA 06/2021 normal.  Plan rpt around 06/2023.  An After Visit Summary was printed and given to the patient.  FOLLOW UP:  Return in about 6 months (around 07/27/2023) for routine chronic illness f/u.  Signed:  Crissie Sickles, MD  01/26/2023  

## 2023-01-26 NOTE — Patient Instructions (Signed)

## 2023-02-10 ENCOUNTER — Ambulatory Visit: Payer: Medicare Other

## 2023-02-14 ENCOUNTER — Ambulatory Visit: Payer: Medicare Other

## 2023-02-14 ENCOUNTER — Ambulatory Visit
Admission: RE | Admit: 2023-02-14 | Discharge: 2023-02-14 | Disposition: A | Discharge status: Home / Self Care | Payer: Medicare Other | Source: Ambulatory Visit | Attending: Family Medicine | Admitting: Family Medicine

## 2023-02-14 DIAGNOSIS — Z1231 Encounter for screening mammogram for malignant neoplasm of breast: Secondary | ICD-10-CM

## 2023-02-23 ENCOUNTER — Telehealth: Payer: Self-pay

## 2023-02-23 DIAGNOSIS — R7303 Prediabetes: Secondary | ICD-10-CM

## 2023-02-23 MED ORDER — BLOOD GLUCOSE MONITOR KIT: PACK | 0 refills | Status: AC

## 2023-02-23 NOTE — Telephone Encounter (Signed)
Rx sent 

## 2023-02-23 NOTE — Telephone Encounter (Signed)
Patient needs new One Touch Monitor, lancets and strips.  (Her current one stopped working-about 74+years old)  [place]

## 2023-02-24 ENCOUNTER — Other Ambulatory Visit: Payer: Self-pay | Admitting: Family Medicine

## 2023-03-01 ENCOUNTER — Encounter: Payer: Self-pay | Admitting: Family Medicine

## 2023-03-01 ENCOUNTER — Ambulatory Visit (INDEPENDENT_AMBULATORY_CARE_PROVIDER_SITE_OTHER): Payer: Medicare Other | Admitting: Family Medicine

## 2023-03-01 VITALS — Temp 97.6°F

## 2023-03-01 DIAGNOSIS — J4531 Mild persistent asthma with (acute) exacerbation: Secondary | ICD-10-CM | POA: Diagnosis not present

## 2023-03-01 DIAGNOSIS — J0191 Acute recurrent sinusitis, unspecified: Secondary | ICD-10-CM

## 2023-03-01 MED ORDER — BENZONATATE 200 MG PO CAPS: 200.0000 mg | ORAL_CAPSULE | Freq: Three times a day (TID) | ORAL | 0 refills | Status: DC | PRN

## 2023-03-01 MED ORDER — PREDNISONE 20 MG PO TABS: ORAL_TABLET | ORAL | 0 refills | Status: DC

## 2023-03-01 MED ORDER — DOXYCYCLINE HYCLATE 100 MG PO CAPS: 100.0000 mg | ORAL_CAPSULE | Freq: Two times a day (BID) | ORAL | 0 refills | Status: AC

## 2023-03-01 NOTE — Progress Notes (Signed)
OFFICE VISIT  03/01/2023  CC:  Chief Complaint  Patient presents with   Cough    Pt also c/o wheezing, phlegm (yellow/green); has used has used otc meds; allegra, robitussin, tylenol cold/flu. Would like tessalon perls for cough and prednisone.     Patient is a 67 y.o. female who presents for sinus congestion.  HPI: Onset about a week ago: Nasal congestion, sinus pressure, postnasal drip, cough.  No fever.  She does feel little bit tight in her chest the last couple of days and does hurt a little bit of wheezing. She has Qvar that she just started and has albuterol that she used once yesterday.  +Hx allergic rhinitis and asthma.  Review of systems: No nausea, vomiting, diarrhea, headache, sore throat, chest pain, rash, abdominal pain, or urinary symptoms.  Past Medical History:  Diagnosis Date   BPPV (benign paroxysmal positional vertigo) 01/30/2014   CAP (community acquired pneumonia) 10/2018   with acute hypoxic RF, empyema-->VATS   Cyst, breast 01/2012; 06/2012; 0000000; [id number]   Complicated cysts in upper outer left breast--no evidence of malignancy--f/u bilat diag mammo/left breast u/s on 03/06/15 showed resolution of left breast cysts.  Repeat screening mammogram 1 yr.   Empyema ([place])    drained by CT surgery/VATS.  Full recovery as of 12/2018 CT surgery f/u visit.   H/O allergic rhinitis    Dr. Ishmael Holter   Hepatic cyst 2019   Noted on CT chest imaging.  Multiple, small, "simple"   History of pneumonia 2009 and 2010   supplemential oxygen d/c'd 12/16/18 by pulm   Mild persistent asthma, well controlled 2014   New adult onset asthma after respiratory infection (Dr. Ishmael Holter)   Morbid obesity ([place])    BMI 50+.  At one point in time she was going to Bariatric clinic on [address] and got weekly HCG injections, monthly vit B12 injections, and took phentermine daily.  As of 05/2015 she was no longer doing this.   Nephrolithiasis 04/2018 first episode   Dr. Lovena Neighbours ([facility]): 1.61m  right UVJ stone + 6 mm L AML.  Pt elected for trial of passage.   OSA (obstructive sleep apnea) 10/2018   Mild/mod OSA 12/2018--pt not able to afford CPAP as of 01/2019.   Osteoarthritis of both knees    No improvement with steroid injections; did synvisc x 3 yrs; bilat replacement was recommended but she declined.   Prediabetes    she's on metformin   Pulmonary nodule 06/29/2021   on lung ca screening CT->repeat 6 mo   Recurrent low back pain    Thyroid nodule 03/2022   1 on each side, needs rpt u/s 03/2023    Past Surgical History:  Procedure Laterality Date   ABarron 2010   Normal coronaries per pt   CARDIOVASCULAR STRESS TEST  2010   abnl per pt; f/u cath clean   CESAREAN SECTION     x 3   CHOLECYSTECTOMY  1986   COLONOSCOPY  2011   lipoma and small diminutive polyp in rectum (in [place])-->[facility] to do repeat as of 04/05/21   COLONOSCOPY  06/15/2021   Normal-recall 10 yrs   COLONOSCOPY WITH PROPOFOL N/A 06/15/2021   Procedure: COLONOSCOPY WITH PROPOFOL;  Surgeon: BOtis Brace MD;  Location: [facility];  Service: Gastroenterology;  Laterality: N/A;   DEXA  06/2021   06/2021 NORMAL-> rpt 2 yrs.   EMPYEMA DRAINAGE Left 10/24/2018   Evacuation of empyema with decortication.  Procedure: EMPYEMA DRAINAGE;  Surgeon: Grace Isaac, MD;  Location: [place];  Service: Thoracic;  Laterality: Left;   LUMBAR DISC SURGERY  1993   L5/S1   TONSILLECTOMY AND ADENOIDECTOMY  1967ish   TOTAL ABDOMINAL HYSTERECTOMY  1994   Ovaries are still in.  This was done for DUB/fibroids.   VIDEO ASSISTED THORACOSCOPY (VATS)/THOROCOTOMY Left 10/24/2018   Procedure: VIDEO ASSISTED THORACOSCOPY (VATS)/THOROCOTOMY with EVACUATION OF EMPYEMA AND DECORTICATION.;  Surgeon: Grace Isaac, MD;  Location: [place];  Service: Thoracic;  Laterality: Left;   VIDEO BRONCHOSCOPY N/A 10/24/2018   Procedure: VIDEO BRONCHOSCOPY;  Surgeon: Grace Isaac, MD;  Location: [facility];  Service: Thoracic;  Laterality: N/A;    Outpatient Medications Prior to Visit  Medication Sig Dispense Refill   albuterol (VENTOLIN HFA) 108 (90 Base) MCG/ACT inhaler Inhale 2 puffs into the lungs every 6 (six) hours as needed for wheezing or shortness of breath. 8 g 6   Ascorbic Acid (VITAMIN C) 1000 MG tablet Take 1,000 mg by mouth daily.     beclomethasone (QVAR REDIHALER) 40 MCG/ACT inhaler Inhale 2 puffs into the lungs 2 (two) times daily. 1 each 1   blood glucose meter kit and supplies KIT Use to check glucose 1-2 times daily 1 each 0   cholecalciferol (VITAMIN D) 25 MCG (1000 UNIT) tablet Take 1,000 Units by mouth daily.     COLLAGEN PO Take by mouth.     Fexofenadine HCl (ALLEGRA PO) Take by mouth daily.     fluticasone (FLONASE) 50 MCG/ACT nasal spray Place 1 spray into both nostrils daily. 16 g 2   glucose blood (CONTOUR NEXT TEST) test strip Use to check blood sugar once daily 100 each 11   Lancets (ONETOUCH DELICA PLUS Q000111Q) MISC USE TO CHECK BLOOD SUGAR ONCE DAILY 100 each 12   magnesium gluconate (MAGONATE) 500 MG tablet Take 500 mg by mouth daily.     metFORMIN (GLUCOPHAGE) 1000 MG tablet TAKE 1 TABLET (1,000 MG TOTAL) BY MOUTH TWICE A DAY WITH FOOD 180 tablet 1   Multiple Vitamin (MULTIVITAMIN) tablet Take 1 tablet by mouth daily.     ONETOUCH ULTRA test strip USE TO CHECK BLOOD SUGAR ONCE DAILY 100 strip 12   OVER THE COUNTER MEDICATION daily. GOLO     No facility-administered medications prior to visit.    Allergies  Allergen Reactions   Penicillins Nausea Only and Rash    Has patient had a PCN reaction causing immediate rash, facial/tongue/throat swelling, SOB or lightheadedness with hypotension: yES Has patient had a PCN reaction causing severe rash involving mucus membranes or skin necrosis: No Has patient had a PCN reaction that required hospitalization: No Has patient had a PCN reaction occurring within the last 10 years: No If all of the above answers  are "NO", then may proceed with Cephalosporin use. CC    Review of Systems  As per HPI  PE:    01/26/2023    8:05 AM 12/05/2022    2:13 PM 11/21/2022   11:02 AM  Vitals with BMI  Height '5\' 7"'$     Weight 338 lbs    BMI Q000111Q    Systolic [id number] Q000111Q 90  Diastolic 71 79 61  Pulse 68 64 82     Physical Exam  VS: noted--normal. Gen: alert, NAD, NONTOXIC APPEARING. HEENT: eyes without injection, drainage, or swelling.  Ears: EACs clear, TMs with normal light reflex and landmarks.  Nose: Clear rhinorrhea, with some dried, crusty exudate  adherent to mildly injected mucosa.  No purulent d/c.  No paranasal sinus TTP.  No facial swelling.  Throat and mouth without focal lesion.  No pharyngial swelling, erythema, or exudate.   Neck: supple, no LAD.   LUNGS: CTA bilat, nonlabored resps.   CV: RRR, no m/r/g. EXT: no c/c/e SKIN: no rash   LABS:  Last metabolic panel Lab Results  Component Value Date   GLUCOSE 106 (H) 01/26/2023   NA 144 01/26/2023   K 4.3 01/26/2023   CL 107 01/26/2023   CO2 31 01/26/2023   BUN 15 01/26/2023   CREATININE 0.85 01/26/2023   GFRNONAA >60 10/30/2018   CALCIUM 9.5 01/26/2023   PHOS 2.8 10/26/2018   PROT 6.5 01/26/2023   ALBUMIN 4.0 01/26/2023   BILITOT 0.4 01/26/2023   ALKPHOS 66 01/26/2023   AST 14 01/26/2023   ALT 12 01/26/2023   ANIONGAP 7 10/30/2018   IMPRESSION AND PLAN:  Acute sinusitis with acute exacerbation of asthma. Prednisone 40 mg a day x 5 days, doxycycline 100 mg twice daily x 10 days, Tessalon Perles 200 mg 3 times daily as needed.  An After Visit Summary was printed and given to the patient.  FOLLOW UP: Return if symptoms worsen or fail to improve.  Signed:  Crissie Sickles, MD           03/01/2023

## 2023-03-07 ENCOUNTER — Ambulatory Visit: Payer: Medicare Other

## 2023-03-31 ENCOUNTER — Ambulatory Visit: Payer: Medicare Other | Admitting: Nurse Practitioner

## 2023-03-31 ENCOUNTER — Encounter: Payer: Self-pay | Admitting: Nurse Practitioner

## 2023-03-31 VITALS — BP 126/62 | HR 68 | Temp 98.1°F | Ht 67.0 in | Wt 340.4 lb

## 2023-03-31 DIAGNOSIS — J45991 Cough variant asthma: Secondary | ICD-10-CM | POA: Diagnosis not present

## 2023-03-31 DIAGNOSIS — J309 Allergic rhinitis, unspecified: Secondary | ICD-10-CM | POA: Diagnosis not present

## 2023-03-31 DIAGNOSIS — G4733 Obstructive sleep apnea (adult) (pediatric): Secondary | ICD-10-CM

## 2023-03-31 DIAGNOSIS — R911 Solitary pulmonary nodule: Secondary | ICD-10-CM

## 2023-03-31 DIAGNOSIS — J45909 Unspecified asthma, uncomplicated: Secondary | ICD-10-CM | POA: Diagnosis not present

## 2023-03-31 DIAGNOSIS — Z9189 Other specified personal risk factors, not elsewhere classified: Secondary | ICD-10-CM

## 2023-03-31 NOTE — Progress Notes (Unsigned)
  ID: Taylor Leon, female    DOB: 1956-12-14, 67 y.o.   MRN: [id number]  No chief complaint on file.   Referring provider: Jeoffrey Massed, MD  HPI: 67 year old female, former smoker followed for cough variant asthma and OSA on CPAP.  She is a patient of Dr. Reginia Naas and last seen in office 09/29/2022.  Past medical history significant for allergic rhinitis, DM 2, paroxysmal positional vertigo, prediabetes, obesity.  TEST/EVENTS: 01/2019 PFTs: Normal 04/2021 HST: AHI 14/h 06/2021 LDCT chest: RADS 3, 7 mm nodule right middle lobe 01/2022 CT chest without contrast with stable nodule  09/29/2022: OV with Dr. Vassie Loll.  Chronic cough and intermittent wheezing since she moved from [place] to [place] in 2012.  Previous allergist diagnosed her with allergy induced asthma.  She had been hospitalized in October 2019 for pneumonia and parapneumonic effusion and underwent VATS thoracotomy.  She has a history of 7 mm right middle lobe nodule which has been stable since 2019.  At previous visit, decided to hold off on Qvar and continue Allegra during spring and fall.  She did well during this last spring.  This fall she developed a runny nose and then a cough for which she had to restart Qvar, which did help.  She is advised to continue using Qvar during change of season in spring and fall.  Not having any trouble with CPAP mask or pressure.  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  03/31/2023: Today-follow-up Patient presents today for follow-up.  She has been doing well over the last couple of weeks.  She did have a flareup of her asthma in early March, related to the change of season.  She had to have steroids, prescribed by her PCP.  She also had a flareup in November 2023 with similar symptoms of increased cough, shortness of breath, chest heaviness, which also required prednisone to treat.  She has not been using Qvar on a consistent basis.  She is taking her Allegra daily.   Today, she tells me that her breathing is doing better.  She still has some occasional chest tightness.  Has not noticed much wheezing recently.  Cough seems better controlled.  Denies any fevers, chills, hemoptysis, increased leg swelling, orthopnea.  She wears her CPAP nightly.  Has not had her repeat CT for yearly follow-up.  Allergies  Allergen Reactions   Penicillins Nausea Only and Rash    Has patient had a PCN reaction causing immediate rash, facial/tongue/throat swelling, SOB or lightheadedness with hypotension: yES Has patient had a PCN reaction causing severe rash involving mucus membranes or skin necrosis: No Has patient had a PCN reaction that required hospitalization: No Has patient had a PCN reaction occurring within the last 10 years: No If all of the above answers are "NO", then may proceed with Cephalosporin use. CC    Immunization History  Administered Date(s) Administered   Fluad Quad(high Dose 65+) 09/14/2021, 10/07/2022   Influenza,inj,Quad PF,6+ Mos 08/29/2013, 09/10/2015, 12/06/2016, 11/02/2017, 10/10/2018, 08/23/2019, 12/21/2020   Moderna Covid-19 Vaccine Bivalent Booster 63yrs & up 11/09/2021, 08/15/2022   Moderna SARS-COV2 Booster Vaccination 05/25/2021   Moderna Sars-Covid-2 Vaccination 02/08/2020, 03/07/2020, 11/25/2020   PNEUMOCOCCAL CONJUGATE-20 06/10/2021   PPD Test 01/27/2017   Pneumococcal Polysaccharide-23 01/25/2019   Respiratory Syncytial Virus Vaccine,Recomb Aduvanted(Arexvy) 12/16/2022   Tdap 06/05/2015   Zoster Recombinat (Shingrix) 06/20/2017, 08/30/2017    Past Medical History:  Diagnosis Date   BPPV (benign paroxysmal positional vertigo) 01/30/2014   CAP (  community acquired pneumonia) 10/2018   with acute hypoxic RF, empyema-->VATS   Cyst, breast 01/2012; 06/2012; 02/26/13; 03/03/14   Complicated cysts in upper outer left breast--no evidence of malignancy--f/u bilat diag mammo/left breast u/s on 03/06/15 showed resolution of left breast cysts.   Repeat screening mammogram 1 yr.   Empyema    drained by CT surgery/VATS.  Full recovery as of 12/2018 CT surgery f/u visit.   H/O allergic rhinitis    Dr. Willa RoughHicks   Hepatic cyst 2019   Noted on CT chest imaging.  Multiple, small, "simple"   History of pneumonia 2009 and 2010   supplemential oxygen d/c'd 12/16/18 by pulm   Mild persistent asthma, well controlled 2014   New adult onset asthma after respiratory infection (Dr. Willa RoughHicks)   Morbid obesity    BMI 50+.  At one point in time she was going to Bariatric clinic on [address] and got weekly HCG injections, monthly vit B12 injections, and took phentermine daily.  As of 05/2015 she was no longer doing this.   Nephrolithiasis 04/2018 first episode   [facility]. Liliane ShiWinter ([facility]): 1.465mm right UVJ stone + 6 mm L AML.  Pt elected for trial of passage.   OSA (obstructive sleep apnea) 10/2018   Mild/mod OSA 12/2018--pt not able to afford CPAP as of 01/2019.   Osteoarthritis of both knees    No improvement with steroid injections; did synvisc x 3 yrs; bilat replacement was recommended but she declined.   Prediabetes    she's on metformin   Pulmonary nodule 06/29/2021   on lung ca screening CT->repeat 6 mo   Recurrent low back pain    Thyroid nodule 03/2022   1 on each side, needs rpt u/s 03/2023    Tobacco History: Social History   Tobacco Use  Smoking Status Former   Packs/day: 0.75   Years: 34.00   Additional pack years: 0.00   Total pack years: 25.50   Types: Cigarettes   Start date: 08/1976   Quit date: 12/28/2009   Years since quitting: 13.2  Smokeless Tobacco Never   Counseling given: Not Answered   Outpatient Medications Prior to Visit  Medication Sig Dispense Refill   albuterol (VENTOLIN HFA) 108 (90 Base) MCG/ACT inhaler Inhale 2 puffs into the lungs every 6 (six) hours as needed for wheezing or shortness of breath. 8 g 6   Ascorbic Acid (VITAMIN C) 1000 MG tablet Take 1,000 mg by mouth daily.     beclomethasone (QVAR  REDIHALER) 40 MCG/ACT inhaler Inhale 2 puffs into the lungs 2 (two) times daily. 1 each 1   blood glucose meter kit and supplies KIT Use to check glucose 1-2 times daily 1 each 0   cholecalciferol (VITAMIN D) 25 MCG (1000 UNIT) tablet Take 1,000 Units by mouth daily.     COLLAGEN PO Take by mouth.     Fexofenadine HCl (ALLEGRA PO) Take by mouth daily.     fluticasone (FLONASE) 50 MCG/ACT nasal spray Place 1 spray into both nostrils daily. 16 g 2   Lancets (ONETOUCH DELICA PLUS LANCET30G) MISC USE TO CHECK BLOOD SUGAR ONCE DAILY 100 each 12   magnesium gluconate (MAGONATE) 500 MG tablet Take 500 mg by mouth daily.     metFORMIN (GLUCOPHAGE) 1000 MG tablet TAKE 1 TABLET (1,000 MG TOTAL) BY MOUTH TWICE A DAY WITH FOOD 180 tablet 1   Multiple Vitamin (MULTIVITAMIN) tablet Take 1 tablet by mouth daily.     ONETOUCH ULTRA test strip USE TO  CHECK BLOOD SUGAR ONCE DAILY 100 strip 12   OVER THE COUNTER MEDICATION daily. GOLO     benzonatate (TESSALON) 200 MG capsule Take 1 capsule (200 mg total) by mouth 3 (three) times daily as needed for cough. 30 capsule 0   glucose blood (CONTOUR NEXT TEST) test strip Use to check blood sugar once daily 100 each 11   predniSONE (DELTASONE) 20 MG tablet 2 tabs po qd x 5d 10 tablet 0   No facility-administered medications prior to visit.     Review of Systems:   Constitutional: No weight loss or gain, night sweats, fevers, chills, fatigue, or lassitude. HEENT: No headaches, difficulty swallowing, tooth/dental problems, or sore throat. No sneezing, itching, ear ache. +occasional nasal congestion, post nasal drip CV:  No chest pain, orthopnea, PND, swelling in lower extremities, anasarca, dizziness, palpitations, syncope Resp: +chest tightness, improved shortness of breath with exertion; occasional dry cough. No excess mucus or change in color of mucus. No hemoptysis. No wheezing.  No chest wall deformity GI:  No heartburn, indigestion, abdominal pain, nausea,  vomiting, diarrhea, change in bowel habits, loss of appetite, bloody stools.  GU: No dysuria, change in color of urine, urgency or frequency.  Skin: No rash, lesions, ulcerations MSK:  No joint pain or swelling.   Neuro: No dizziness or lightheadedness.  Psych: No depression or anxiety. Mood stable.     Physical Exam:  BP 126/62 (BP Location: Right Wrist, Patient Position: Sitting, Cuff Size: Normal)   Pulse 68   Temp 98.1 F (36.7 C) (Oral)   Ht 5\' 7"  (1.702 m)   Wt (!) 340 lb 6.4 oz (154.4 kg)   SpO2 93%   BMI 53.31 kg/m   GEN: Pleasant, interactive, well-appearing; morbidly obese; in no acute distress. HEENT:  Normocephalic and atraumatic. PERRLA. Sclera white. Nasal turbinates pink, moist and patent bilaterally. No rhinorrhea present. Oropharynx pink and moist, without exudate or edema. No lesions, ulcerations, or postnasal drip.  NECK:  Supple w/ fair ROM. No JVD present. Normal carotid impulses w/o bruits. Thyroid symmetrical with no goiter or nodules palpated. No lymphadenopathy.   CV: RRR, no m/r/g, no peripheral edema. Pulses intact, +2 bilaterally. No cyanosis, pallor or clubbing. PULMONARY:  Unlabored, regular breathing. Clear bilaterally A&P w/o wheezes/rales/rhonchi. No accessory muscle use.  GI: BS present and normoactive. Soft, non-tender to palpation. No organomegaly or masses detected.  MSK: No erythema, warmth or tenderness. Cap refil <2 sec all extrem. No deformities or joint swelling noted.  Neuro: A/Ox3. No focal deficits noted.   Skin: Warm, no lesions or rashe Psych: Normal affect and behavior. Judgement and thought content appropriate.     Lab Results:  CBC    Component Value Date/Time   WBC 9.3 01/26/2023 0829   RBC 4.64 01/26/2023 0829   HGB 12.9 01/26/2023 0829   HCT 39.7 01/26/2023 0829   PLT 288.0 01/26/2023 0829   MCV 85.6 01/26/2023 0829   MCH 26.3 10/31/2018 0332   MCHC 32.4 01/26/2023 0829   RDW 15.7 (H) 01/26/2023 0829   LYMPHSABS 2.3  12/28/2021 0927   MONOABS 0.5 12/28/2021 0927   EOSABS 0.2 12/28/2021 0927   BASOSABS 0.0 12/28/2021 0927    BMET    Component Value Date/Time   NA 144 01/26/2023 0829   K 4.3 01/26/2023 0829   CL 107 01/26/2023 0829   CO2 31 01/26/2023 0829   GLUCOSE 106 (H) 01/26/2023 0829   BUN 15 01/26/2023 0829   CREATININE 0.85 01/26/2023 0829  CALCIUM 9.5 01/26/2023 0829   GFRNONAA >60 10/30/2018 0318   GFRAA >60 10/30/2018 0318    BNP    Component Value Date/Time   BNP 176.7 (H) 10/19/2018 0737     Imaging:  No results found.       Latest Ref Rng & Units 02/18/2019   11:01 AM  PFT Results  FVC-Pre L 2.30   FVC-Predicted Pre % 78   FVC-Post L 2.38   FVC-Predicted Post % 81   Pre FEV1/FVC % % 80   Post FEV1/FCV % % 82   FEV1-Pre L 1.84   FEV1-Predicted Pre % 80   FEV1-Post L 1.95   DLCO uncorrected ml/min/mmHg 23.03   DLCO UNC% % 104   DLCO corrected ml/min/mmHg 23.17   DLCO COR %Predicted % 104   DLVA Predicted % 138   TLC L 4.34   TLC % Predicted % 78   RV % Predicted % 74     No results found for: "NITRICOXIDE"      Assessment & Plan:   Cough variant asthma Resolving recent exacerbation.  She has had 2 flares requiring steroids over the last 6 months.  She will need to restart maintenance therapy with Qvar twice daily.  We discussed the importance of this and preventing future flareups.  She can try resuming as needed use once allergy season is over.  Would recommend that she restart daily use in the fall as well.  Action plan in place.  Patient Instructions  Continue Albuterol inhaler 2 puffs every 6 hours as needed for shortness of breath or wheezing Use Qvar 2 puffs Twice daily until the end of allergy season then you can resume using as needed for shortness of breath or wheezing. Brush tongue and rinse mouth afterwards  Continue allegra 1 tab daily for allergies Continue flonase 2 sprays each nostril daily  Follow up in 3 months with Dr. Vassie LollAlva  (1st) or Katie Alyus Mofield,NP. If symptoms do not improve or worsen, please contact office for sooner follow up or seek emergency care.    Allergic rhinitis Stable on current regimen.  Will continue Allegra.  See above plan.  Could consider adding on Singulair if she continues to have trouble during allergy seasons.  Pulmonary nodule less than 1 cm in diameter with low risk for malignant neoplasm Stable 7 mm nodule, likely benign.  She was post to have repeat CT scan in February 2024.  We will schedule this today.  OSA (obstructive sleep apnea) Excellent compliance and continues to receive good benefit from use.  Cautioned on safe driving practices.   I spent 35 minutes of dedicated to the care of this patient on the date of this encounter to include pre-visit review of records, face-to-face time with the patient discussing conditions above, post visit ordering of testing, clinical documentation with the electronic health record, making appropriate referrals as documented, and communicating necessary findings to members of the patients care team.  Noemi ChapelKatherine V Rubie Ficco, NP 04/01/2023  Pt aware and understands NP's role.

## 2023-03-31 NOTE — Patient Instructions (Addendum)
Continue Albuterol inhaler 2 puffs every 6 hours as needed for shortness of breath or wheezing Use Qvar 2 puffs Twice daily until the end of allergy season then you can resume using as needed for shortness of breath or wheezing. Brush tongue and rinse mouth afterwards  Continue allegra 1 tab daily for allergies Continue flonase 2 sprays each nostril daily  Follow up in 3 months with Dr. Vassie Loll (1st) or Katie Chessica Audia,NP. If symptoms do not improve or worsen, please contact office for sooner follow up or seek emergency care.

## 2023-04-01 ENCOUNTER — Encounter: Payer: Self-pay | Admitting: Nurse Practitioner

## 2023-04-01 NOTE — Assessment & Plan Note (Signed)
Excellent compliance and continues to receive good benefit from use.  Cautioned on safe driving practices.

## 2023-04-01 NOTE — Assessment & Plan Note (Signed)
Resolving recent exacerbation.  She has had 2 flares requiring steroids over the last 6 months.  She will need to restart maintenance therapy with Qvar twice daily.  We discussed the importance of this and preventing future flareups.  She can try resuming as needed use once allergy season is over.  Would recommend that she restart daily use in the fall as well.  Action plan in place.  Patient Instructions  Continue Albuterol inhaler 2 puffs every 6 hours as needed for shortness of breath or wheezing Use Qvar 2 puffs Twice daily until the end of allergy season then you can resume using as needed for shortness of breath or wheezing. Brush tongue and rinse mouth afterwards  Continue allegra 1 tab daily for allergies Continue flonase 2 sprays each nostril daily  Follow up in 3 months with Dr. Vassie Leon (1st) or Taylor Lucille Witts,NP. If symptoms do not improve or worsen, please contact office for sooner follow up or seek emergency care.

## 2023-04-01 NOTE — Assessment & Plan Note (Signed)
Stable on current regimen.  Will continue Allegra.  See above plan.  Could consider adding on Singulair if she continues to have trouble during allergy seasons.

## 2023-04-01 NOTE — Assessment & Plan Note (Signed)
Stable 7 mm nodule, likely benign.  She was post to have repeat CT scan in February 2024.  We will schedule this today.

## 2023-04-06 DIAGNOSIS — M170 Bilateral primary osteoarthritis of knee: Secondary | ICD-10-CM | POA: Diagnosis not present

## 2023-04-11 DIAGNOSIS — Z0100 Encounter for examination of eyes and vision without abnormal findings: Secondary | ICD-10-CM | POA: Diagnosis not present

## 2023-04-11 DIAGNOSIS — E119 Type 2 diabetes mellitus without complications: Secondary | ICD-10-CM | POA: Diagnosis not present

## 2023-04-13 DIAGNOSIS — M170 Bilateral primary osteoarthritis of knee: Secondary | ICD-10-CM | POA: Diagnosis not present

## 2023-04-20 DIAGNOSIS — K080 Exfoliation of teeth due to systemic causes: Secondary | ICD-10-CM | POA: Diagnosis not present

## 2023-04-20 DIAGNOSIS — M170 Bilateral primary osteoarthritis of knee: Secondary | ICD-10-CM | POA: Diagnosis not present

## 2023-04-21 DIAGNOSIS — K080 Exfoliation of teeth due to systemic causes: Secondary | ICD-10-CM | POA: Diagnosis not present

## 2023-04-24 ENCOUNTER — Ambulatory Visit (HOSPITAL_BASED_OUTPATIENT_CLINIC_OR_DEPARTMENT_OTHER)
Admission: RE | Admit: 2023-04-24 | Discharge: 2023-04-24 | Disposition: A | Discharge status: Home / Self Care | Payer: Medicare Other | Source: Ambulatory Visit | Attending: Nurse Practitioner | Admitting: Nurse Practitioner

## 2023-04-24 DIAGNOSIS — R911 Solitary pulmonary nodule: Secondary | ICD-10-CM | POA: Diagnosis not present

## 2023-04-24 DIAGNOSIS — R918 Other nonspecific abnormal finding of lung field: Secondary | ICD-10-CM | POA: Diagnosis not present

## 2023-04-26 DIAGNOSIS — E119 Type 2 diabetes mellitus without complications: Secondary | ICD-10-CM | POA: Diagnosis not present

## 2023-04-27 LAB — HM DIABETES EYE EXAM

## 2023-05-03 NOTE — Progress Notes (Signed)
Lung nodules are all stable. Please place referral to lung cancer screening program for yearly LDCT chest. Thanks!

## 2023-05-04 ENCOUNTER — Other Ambulatory Visit: Payer: Self-pay | Admitting: Nurse Practitioner

## 2023-05-04 DIAGNOSIS — K080 Exfoliation of teeth due to systemic causes: Secondary | ICD-10-CM | POA: Diagnosis not present

## 2023-05-04 DIAGNOSIS — R911 Solitary pulmonary nodule: Secondary | ICD-10-CM

## 2023-05-04 NOTE — Progress Notes (Signed)
Spoke with pt and notified of results per [facility].Pt verbalized understanding and was agreeable to lung cancer screening referral and this was placed.   Taylor Leon, she is concerned about impression #3- enlarged pulmonic trunk indicative of pulmonary hypertension. She has been reading up on [facility] and is worried she may have this. I do not see that she has ever had ECHO. Please advise, thanks!

## 2023-05-12 ENCOUNTER — Other Ambulatory Visit: Payer: Self-pay | Admitting: Nurse Practitioner

## 2023-05-12 DIAGNOSIS — I288 Other diseases of pulmonary vessels: Secondary | ICD-10-CM

## 2023-05-12 NOTE — Progress Notes (Signed)
I'm so sorry. Somehow I missed this message. Orders were placed for echo for further evaluation. Someone should contact her to schedule this. Thanks!

## 2023-05-18 DIAGNOSIS — K080 Exfoliation of teeth due to systemic causes: Secondary | ICD-10-CM | POA: Diagnosis not present

## 2023-05-27 DIAGNOSIS — E119 Type 2 diabetes mellitus without complications: Secondary | ICD-10-CM | POA: Diagnosis not present

## 2023-05-30 DIAGNOSIS — K080 Exfoliation of teeth due to systemic causes: Secondary | ICD-10-CM | POA: Diagnosis not present

## 2023-06-02 DIAGNOSIS — Z6841 Body Mass Index (BMI) 40.0 and over, adult: Secondary | ICD-10-CM | POA: Diagnosis not present

## 2023-06-08 ENCOUNTER — Ambulatory Visit (HOSPITAL_COMMUNITY)
Admission: RE | Admit: 2023-06-08 | Discharge: 2023-06-08 | Disposition: A | Discharge status: Home / Self Care | Payer: Medicare Other | Source: Ambulatory Visit | Attending: Nurse Practitioner | Admitting: Nurse Practitioner

## 2023-06-08 DIAGNOSIS — I288 Other diseases of pulmonary vessels: Secondary | ICD-10-CM | POA: Insufficient documentation

## 2023-06-08 DIAGNOSIS — I340 Nonrheumatic mitral (valve) insufficiency: Secondary | ICD-10-CM | POA: Diagnosis not present

## 2023-06-08 DIAGNOSIS — G4730 Sleep apnea, unspecified: Secondary | ICD-10-CM | POA: Insufficient documentation

## 2023-06-08 DIAGNOSIS — I272 Pulmonary hypertension, unspecified: Secondary | ICD-10-CM | POA: Diagnosis not present

## 2023-06-08 LAB — ECHOCARDIOGRAM COMPLETE
Area-P 1/2: 4.49 cm2
Calc EF: 56.5 %
S' Lateral: 3.5 cm
Single Plane A2C EF: 59.2 %
Single Plane A4C EF: 55.8 %

## 2023-06-08 NOTE — Progress Notes (Signed)
Echocardiogram 2D Echocardiogram has been performed.  Taylor Leon 06/08/2023, 10:01 AM

## 2023-06-21 DIAGNOSIS — R931 Abnormal findings on diagnostic imaging of heart and coronary circulation: Secondary | ICD-10-CM

## 2023-06-21 DIAGNOSIS — K080 Exfoliation of teeth due to systemic causes: Secondary | ICD-10-CM | POA: Diagnosis not present

## 2023-06-26 DIAGNOSIS — E119 Type 2 diabetes mellitus without complications: Secondary | ICD-10-CM | POA: Diagnosis not present

## 2023-06-26 NOTE — Telephone Encounter (Signed)
For some reason, the results never came through to my box so I did not know she had her echo. I apologize for the delay. I reviewed the results. She has some strain to the left and right sides of her heart with enlargement but overall pumping function is normal. She does have a decrease in how well the heart contracts, which is mild. It is possible that she has some underlying pulmonary hypertension, based on the strain to the right side of her heart, but her pulmonary artery pressures were normal on the testing she had completed. No significant valvular disease. Some very mild leakage but nothing that would cause any symptoms. Would recommend she see cardiology for monitoring. Please place referral. Thanks.

## 2023-06-28 ENCOUNTER — Ambulatory Visit (INDEPENDENT_AMBULATORY_CARE_PROVIDER_SITE_OTHER): Payer: Medicare Other

## 2023-06-28 ENCOUNTER — Ambulatory Visit: Payer: Medicare Other

## 2023-06-28 ENCOUNTER — Encounter (HOSPITAL_BASED_OUTPATIENT_CLINIC_OR_DEPARTMENT_OTHER): Payer: Self-pay | Admitting: Pulmonary Disease

## 2023-06-28 VITALS — Wt 340.0 lb

## 2023-06-28 DIAGNOSIS — Z0000 Encounter for general adult medical examination without abnormal findings: Secondary | ICD-10-CM | POA: Diagnosis not present

## 2023-06-28 NOTE — Patient Instructions (Signed)
Taylor Leon , Thank you for taking time to come for your Medicare Wellness Visit. I appreciate your ongoing commitment to your health goals. Please review the following plan we discussed and let me know if I can assist you in the future.   These are the goals we discussed:  Goals      Patient Stated     Lose weight      Patient Stated     None at this time         This is a list of the screening recommended for you and due dates:  Health Maintenance  Topic Date Due   Eye exam for diabetics  05/28/2022   COVID-19 Vaccine (7 - 2023-24 season) 10/10/2022   Flu Shot  07/27/2023   Hemoglobin A1C  07/27/2023   Yearly kidney function blood test for diabetes  01/27/2024   Yearly kidney health urinalysis for diabetes  01/27/2024   Complete foot exam   01/27/2024   Screening for Lung Cancer  04/23/2024   Medicare Annual Wellness Visit  06/27/2024   Mammogram  02/14/2025   DTaP/Tdap/Td vaccine (2 - Td or Tdap) 06/04/2025   Colon Cancer Screening  06/16/2031   Pneumonia Vaccine  Completed   DEXA scan (bone density measurement)  Completed   Hepatitis C Screening  Completed   Zoster (Shingles) Vaccine  Completed   HPV Vaccine  Aged Out    Advanced directives: Please bring a copy of your health care power of attorney and living will to the office at your convenience.   Conditions/risks identified: none at this time   Next appointment: Follow up in one year for your annual wellness visit    Preventive Care 65 Years and Older, Female Preventive care refers to lifestyle choices and visits with your health care provider that can promote health and wellness. What does preventive care include? A yearly physical exam. This is also called an annual well check. Dental exams once or twice a year. Routine eye exams. Ask your health care provider how often you should have your eyes checked. Personal lifestyle choices, including: Daily care of your teeth and gums. Regular physical  activity. Eating a healthy diet. Avoiding tobacco and drug use. Limiting alcohol use. Practicing safe sex. Taking low-dose aspirin every day. Taking vitamin and mineral supplements as recommended by your health care provider. What happens during an annual well check? The services and screenings done by your health care provider during your annual well check will depend on your age, overall health, lifestyle risk factors, and family history of disease. Counseling  Your health care provider may ask you questions about your: Alcohol use. Tobacco use. Drug use. Emotional well-being. Home and relationship well-being. Sexual activity. Eating habits. History of falls. Memory and ability to understand (cognition). Work and work Astronomer. Reproductive health. Screening  You may have the following tests or measurements: Height, weight, and BMI. Blood pressure. Lipid and cholesterol levels. These may be checked every 5 years, or more frequently if you are over 35 years old. Skin check. Lung cancer screening. You may have this screening every year starting at age 49 if you have a 30-pack-year history of smoking and currently smoke or have quit within the past 15 years. Fecal occult blood test (FOBT) of the stool. You may have this test every year starting at age 63. Flexible sigmoidoscopy or colonoscopy. You may have a sigmoidoscopy every 5 years or a colonoscopy every 10 years starting at age 26. Hepatitis C blood  test. Hepatitis B blood test. Sexually transmitted disease (STD) testing. Diabetes screening. This is done by checking your blood sugar (glucose) after you have not eaten for a while (fasting). You may have this done every 1-3 years. Bone density scan. This is done to screen for osteoporosis. You may have this done starting at age 79. Mammogram. This may be done every 1-2 years. Talk to your health care provider about how often you should have regular mammograms. Talk with your  health care provider about your test results, treatment options, and if necessary, the need for more tests. Vaccines  Your health care provider may recommend certain vaccines, such as: Influenza vaccine. This is recommended every year. Tetanus, diphtheria, and acellular pertussis (Tdap, Td) vaccine. You may need a Td booster every 10 years. Zoster vaccine. You may need this after age 9. Pneumococcal 13-valent conjugate (PCV13) vaccine. One dose is recommended after age 7. Pneumococcal polysaccharide (PPSV23) vaccine. One dose is recommended after age 40. Talk to your health care provider about which screenings and vaccines you need and how often you need them. This information is not intended to replace advice given to you by your health care provider. Make sure you discuss any questions you have with your health care provider. Document Released: 01/08/2016 Document Revised: 08/31/2016 Document Reviewed: 10/13/2015 Elsevier Interactive Patient Education  2017 [employer] Prevention in the Home Falls can cause injuries. They can happen to people of all ages. There are many things you can do to make your home safe and to help prevent falls. What can I do on the outside of my home? Regularly fix the edges of walkways and driveways and fix any cracks. Remove anything that might make you trip as you walk through a door, such as a raised step or threshold. Trim any bushes or trees on the path to your home. Use bright outdoor lighting. Clear any walking paths of anything that might make someone trip, such as rocks or tools. Regularly check to see if handrails are loose or broken. Make sure that both sides of any steps have handrails. Any raised decks and porches should have guardrails on the edges. Have any leaves, snow, or ice cleared regularly. Use sand or salt on walking paths during winter. Clean up any spills in your garage right away. This includes oil or grease spills. What can I  do in the bathroom? Use night lights. Install grab bars by the toilet and in the tub and shower. Do not use towel bars as grab bars. Use non-skid mats or decals in the tub or shower. If you need to sit down in the shower, use a plastic, non-slip stool. Keep the floor dry. Clean up any water that spills on the floor as soon as it happens. Remove soap buildup in the tub or shower regularly. Attach bath mats securely with double-sided non-slip rug tape. Do not have throw rugs and other things on the floor that can make you trip. What can I do in the bedroom? Use night lights. Make sure that you have a light by your bed that is easy to reach. Do not use any sheets or blankets that are too big for your bed. They should not hang down onto the floor. Have a firm chair that has side arms. You can use this for support while you get dressed. Do not have throw rugs and other things on the floor that can make you trip. What can I do in the kitchen? Clean  up any spills right away. Avoid walking on wet floors. Keep items that you use a lot in easy-to-reach places. If you need to reach something above you, use a strong step stool that has a grab bar. Keep electrical cords out of the way. Do not use floor polish or wax that makes floors slippery. If you must use wax, use non-skid floor wax. Do not have throw rugs and other things on the floor that can make you trip. What can I do with my stairs? Do not leave any items on the stairs. Make sure that there are handrails on both sides of the stairs and use them. Fix handrails that are broken or loose. Make sure that handrails are as long as the stairways. Check any carpeting to make sure that it is firmly attached to the stairs. Fix any carpet that is loose or worn. Avoid having throw rugs at the top or bottom of the stairs. If you do have throw rugs, attach them to the floor with carpet tape. Make sure that you have a light switch at the top of the stairs  and the bottom of the stairs. If you do not have them, ask someone to add them for you. What else can I do to help prevent falls? Wear shoes that: Do not have high heels. Have rubber bottoms. Are comfortable and fit you well. Are closed at the toe. Do not wear sandals. If you use a stepladder: Make sure that it is fully opened. Do not climb a closed stepladder. Make sure that both sides of the stepladder are locked into place. Ask someone to hold it for you, if possible. Clearly mark and make sure that you can see: Any grab bars or handrails. First and last steps. Where the edge of each step is. Use tools that help you move around (mobility aids) if they are needed. These include: Canes. Walkers. Scooters. Crutches. Turn on the lights when you go into a dark area. Replace any light bulbs as soon as they burn out. Set up your furniture so you have a clear path. Avoid moving your furniture around. If any of your floors are uneven, fix them. If there are any pets around you, be aware of where they are. Review your medicines with your doctor. Some medicines can make you feel dizzy. This can increase your chance of falling. Ask your doctor what other things that you can do to help prevent falls. This information is not intended to replace advice given to you by your health care provider. Make sure you discuss any questions you have with your health care provider. Document Released: 10/08/2009 Document Revised: 05/19/2016 Document Reviewed: 01/16/2015 Elsevier Interactive Patient Education  2017 [employer].

## 2023-06-28 NOTE — Progress Notes (Signed)
Subjective:   Taylor Leon is a 67 y.o. female who presents for Medicare Annual (Subsequent) preventive examination.  Visit Complete: Virtual  I connected with  Taylor Leon on 06/28/23 by a audio enabled telemedicine application and verified that I am speaking with the correct person using two identifiers.  Patient Location: Home  Provider Location: Home Office  I discussed the limitations of evaluation and management by telemedicine. The patient expressed understanding and agreed to proceed.    Review of Systems     Cardiac Risk Factors include: advanced age (>45men, >98 women);obesity (BMI >30kg/m2);diabetes mellitus;dyslipidemia     Objective:    Today's Vitals   06/28/23 1300  Weight: (!) 340 lb (154.2 kg)   Body mass index is 53.25 kg/m.     06/28/2023    1:06 PM 06/15/2022   12:56 PM 06/15/2021   10:36 AM 06/10/2021    1:31 PM 10/19/2018    7:50 AM 05/06/2018    2:23 PM 05/04/2017   10:46 AM  Advanced Directives  Does Patient Have a Medical Advance Directive? No Yes No No No No No  Does patient want to make changes to medical advance directive?  Yes (MAU/Ambulatory/Procedural Areas - Information given)       Would patient like information on creating a medical advance directive? No - Patient declined  No - Patient declined Yes (MAU/Ambulatory/Procedural Areas - Information given) No - Patient declined No - Patient declined No - Patient declined    Current Medications (verified) Outpatient Encounter Medications as of 06/28/2023  Medication Sig   albuterol (VENTOLIN HFA) 108 (90 Base) MCG/ACT inhaler Inhale 2 puffs into the lungs every 6 (six) hours as needed for wheezing or shortness of breath.   Ascorbic Acid (VITAMIN C) 1000 MG tablet Take 1,000 mg by mouth daily.   beclomethasone (QVAR REDIHALER) 40 MCG/ACT inhaler Inhale 2 puffs into the lungs 2 (two) times daily.   blood glucose meter kit and supplies KIT Use to check glucose 1-2 times daily   cholecalciferol  (VITAMIN D) 25 MCG (1000 UNIT) tablet Take 1,000 Units by mouth daily.   COLLAGEN PO Take by mouth.   Fexofenadine HCl (ALLEGRA PO) Take by mouth daily.   fluticasone (FLONASE) 50 MCG/ACT nasal spray Place 1 spray into both nostrils daily.   Lancets (ONETOUCH DELICA PLUS LANCET30G) MISC USE TO CHECK BLOOD SUGAR ONCE DAILY   magnesium gluconate (MAGONATE) 500 MG tablet Take 500 mg by mouth daily.   metFORMIN (GLUCOPHAGE) 1000 MG tablet TAKE 1 TABLET (1,000 MG TOTAL) BY MOUTH TWICE A DAY WITH FOOD   Multiple Vitamin (MULTIVITAMIN) tablet Take 1 tablet by mouth daily.   ONETOUCH ULTRA test strip USE TO CHECK BLOOD SUGAR ONCE DAILY   OVER THE COUNTER MEDICATION daily. GOLO   No facility-administered encounter medications on file as of 06/28/2023.    Allergies (verified) Penicillins   History: Past Medical History:  Diagnosis Date   BPPV (benign paroxysmal positional vertigo) 01/30/2014   CAP (community acquired pneumonia) 10/2018   with acute hypoxic RF, empyema-->VATS   Cyst, breast 01/2012; 06/2012; 02/26/13; 03/03/14   Complicated cysts in upper outer left breast--no evidence of malignancy--f/u bilat diag mammo/left breast u/s on 03/06/15 showed resolution of left breast cysts.  Repeat screening mammogram 1 yr.   Empyema (HCC)    drained by CT surgery/VATS.  Full recovery as of 12/2018 CT surgery f/u visit.   H/O allergic rhinitis    Dr. Willa Rough   Hepatic cyst 2019  Noted on CT chest imaging.  Multiple, small, "simple"   History of pneumonia 2009 and 2010   supplemential oxygen d/c'd 12/16/18 by pulm   Mild persistent asthma, well controlled 2014   New adult onset asthma after respiratory infection (Dr. Willa Rough)   Morbid obesity (HCC)    BMI 50+.  At one point in time she was going to Bariatric clinic on [address] and got weekly HCG injections, monthly vit B12 injections, and took phentermine daily.  As of 05/2015 she was no longer doing this.   Nephrolithiasis 04/2018 first episode    [facility]. Lil[facility]): 1.38mm right UVJ stone + 6 mm L AML.  Pt elected for trial of passage.   OSA (obstructive sleep apnea) 10/2018   Mild/mod OSA 12/2018--pt not able to afford CPAP as of 01/2019.   Osteoarthritis of both knees    No improvement with steroid injections; did synvisc x 3 yrs; bilat replacement was recommended but she declined.   Prediabetes    she's on metformin   Pulmonary nodule 06/29/2021   on lung ca screening CT->repeat 6 mo   Recurrent low back pain    Thyroid nodule 03/2022   1 on each side, needs rpt u/s 03/2023   Past Surgical History:  Procedure Laterality Date   APPENDECTOMY  1978   CARDIAC CATHETERIZATION  2010   Normal coronaries per pt   CARDIOVASCULAR STRESS TEST  2010   abnl per pt; f/u cath clean   CESAREAN SECTION     x 3   CHOLECYSTECTOMY  1986   COLONOSCOPY  2011   lipoma and small diminutive polyp in rectum (in [place])-->[facility] to do repeat as of 04/05/21   COLONOSCOPY  06/15/2021   Normal-recall 10 yrs   COLONOSCOPY WITH PROPOFOL N/A 06/15/2021   Procedure: COLONOSCOPY WITH PROPOFOL;  Surgeon: Kathi Der, MD;  Location: [facility];  Service: Gastroenterology;  Laterality: N/A;   DEXA  06/2021   06/2021 NORMAL-> rpt 2 yrs.   EMPYEMA DRAINAGE Left 10/24/2018   Evacuation of empyema with decortication.  Procedure: EMPYEMA DRAINAGE;  Surgeon: Delight Ovens, MD;  Location: [facility] OR;  Service: Thoracic;  Laterality: Left;   LUMBAR DISC SURGERY  1993   L5/S1   TONSILLECTOMY AND ADENOIDECTOMY  1967ish   TOTAL ABDOMINAL HYSTERECTOMY  1994   Ovaries are still in.  This was done for DUB/fibroids.   VIDEO ASSISTED THORACOSCOPY (VATS)/THOROCOTOMY Left 10/24/2018   Procedure: VIDEO ASSISTED THORACOSCOPY (VATS)/THOROCOTOMY with EVACUATION OF EMPYEMA AND DECORTICATION.;  Surgeon: Delight Ovens, MD;  Location: [facility] OR;  Service: Thoracic;  Laterality: Left;   VIDEO BRONCHOSCOPY N/A 10/24/2018   Procedure: VIDEO BRONCHOSCOPY;  Surgeon: Delight Ovens, MD;  Location: [facility] OR;  Service: Thoracic;  Laterality: N/A;   Family History  Problem Relation Age of Onset   Arthritis Mother    Heart disease Mother 95   Hypertension Mother    Diabetes Mother    Colon cancer Mother        5's   Arthritis Paternal Grandmother    Breast cancer Neg Hx    Social History   Socioeconomic History   Marital status: Married    Spouse name: Not on file   Number of children: 4   Years of education: Not on file   Highest education level: Bachelor's degree (e.g., BA, AB, BS)  Occupational History   Not on file  Tobacco Use   Smoking status: Former    Packs/day: 0.75  Years: 34.00    Additional pack years: 0.00    Total pack years: 25.50    Types: Cigarettes    Start date: 08/1976    Quit date: 12/28/2009    Years since quitting: 13.5   Smokeless tobacco: Never  Vaping Use   Vaping Use: Never used  Substance and Sexual Activity   Alcohol use: Yes    Comment: social   Drug use: No   Sexual activity: Not on file  Other Topics Concern   Not on file  Social History Narrative   Married, 4 grown children.   Relocated to [place], [place] from [place] about 2012.   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   Exercise: intermittently does water aerobics..            Social Determinants of Health   Financial Resource Strain: Low Risk  (06/28/2023)   Overall Financial Resource Strain (CARDIA)    Difficulty of Paying Living Expenses: Not hard at all  Food Insecurity: No Food Insecurity (06/28/2023)   Hunger Vital Sign    Worried About Running Out of Food in the Last Year: Never true    Ran Out of Food in the Last Year: Never true  Transportation Needs: No Transportation Needs (06/28/2023)   PRAPARE - Administrator, Civil Service (Medical): No    Lack of Transportation (Non-Medical): No  Physical Activity: Inactive (06/28/2023)   Exercise Vital Sign    Days of  Exercise per Week: 0 days    Minutes of Exercise per Session: 0 min  Stress: No Stress Concern Present (06/28/2023)   [employer] of Occupational Health - Occupational Stress Questionnaire    Feeling of Stress : Not at all  Social Connections: Socially Integrated (06/28/2023)   Social Connection and Isolation Panel [NHANES]    Frequency of Communication with Friends and Family: More than three times a week    Frequency of Social Gatherings with Friends and Family: More than three times a week    Attends Religious Services: More than 4 times per year    Active Member of [employer] or Organizations: Yes    Attends Banker Meetings: 1 to 4 times per year    Marital Status: Married    Tobacco Counseling Counseling given: Not Answered   Clinical Intake:  Pre-visit preparation completed: Yes  Pain : No/denies pain     BMI - recorded: 53.25 Nutritional Status: BMI > 30  Obese Nutritional Risks: None Diabetes: Yes CBG done?: Yes (pt stated 109) CBG resulted in Enter/ Edit results?: No Did pt. bring in CBG monitor from home?: No  How often do you need to have someone help you when you read instructions, pamphlets, or other written materials from your doctor or pharmacy?: 1 - Never  Interpreter Needed?: No  Information entered by :: Lanier Ensign, LPN   Activities of Daily Living    06/28/2023    1:01 PM  In your present state of health, do you have any difficulty performing the following activities:  Hearing? 0  Vision? 0  Difficulty concentrating or making decisions? 0  Walking or climbing stairs? 1  Comment bad knees  Dressing or bathing? 0  Doing errands, shopping? 0  Preparing Food and eating ? N  Using the Toilet? N  In the past six months, have you accidently leaked urine? N  Do you have problems with loss of bowel control?  N  Managing your Medications? N  Managing your Finances? N  Housekeeping or managing your Housekeeping? N    Patient Care  Team: Jeoffrey Massed, MD as PCP - General (Family Medicine) Baxter Hire, MD (Inactive) as Consulting Physician (Allergy) Rene Paci, MD as Consulting Physician (Urology) Oretha Milch, MD as Consulting Physician (Pulmonary Disease) Kathi Der, MD as Consulting Physician (Gastroenterology)  Indicate any recent Medical Services you may have received from other than [facility] providers in the past year (date may be approximate).     Assessment:   This is a routine wellness examination for Taylor Leon.  Hearing/Vision screen Hearing Screening - Comments:: Pt denies any hearing issues  Vision Screening - Comments:: Pt follows up with [facility] for annual eye exams   Dietary issues and exercise activities discussed:     Goals Addressed             This Visit's Progress    Patient Stated       None at this time        Depression Screen    06/28/2023    1:04 PM 10/07/2022    8:50 AM 06/15/2022   12:55 PM 12/28/2021    8:50 AM 07/31/2020    8:36 AM 01/25/2019    8:37 AM 11/05/2018    3:27 PM  PHQ 2/9 Scores  PHQ - 2 Score 0 0 0 0 0 0 0    Fall Risk    06/28/2023    1:06 PM 02/28/2023    4:53 PM 10/07/2022    8:50 AM 06/15/2022   12:59 PM 12/28/2021    8:50 AM  Fall Risk   Falls in the past year? 0 0 0 0 0  Number falls in past yr: 0  0 0 0  Injury with Fall? 0  0 0 0  Risk for fall due to : Impaired vision  Impaired vision Impaired vision   Follow up Falls prevention discussed  Falls evaluation completed Falls prevention discussed Falls evaluation completed    MEDICARE RISK AT HOME:  Medicare Risk at Home - 06/28/23 1306     Any stairs in or around the home? Yes    If so, are there any without handrails? No    Home free of loose throw rugs in walkways, pet beds, electrical cords, etc? Yes    Adequate lighting in your home to reduce risk of falls? Yes    Life alert? No    Use of a cane, walker or w/c? No    Grab bars in the bathroom? No    Shower chair  or bench in shower? Yes    Elevated toilet seat or a handicapped toilet? No             TIMED UP AND GO:  Was the test performed?  No    Cognitive Function:        06/28/2023    1:07 PM 06/15/2022    1:01 PM  6CIT Screen  What Year? 0 points 0 points  What month? 0 points 0 points  What time? 0 points 0 points  Count back from 20 0 points 0 points  Months in reverse 0 points 0 points  Repeat phrase 0 points 0 points  Total Score 0 points 0 points    Immunizations Immunization History  Administered Date(s) Administered   Fluad Quad(high Dose 65+) 09/14/2021, 10/07/2022   Influenza,inj,Quad PF,6+ Mos 08/29/2013, 09/10/2015, 12/06/2016, 11/02/2017, 10/10/2018, 08/23/2019, 12/21/2020  Moderna Covid-19 Vaccine Bivalent Booster 55yrs & up 11/09/2021, 08/15/2022   Moderna SARS-COV2 Booster Vaccination 05/25/2021   Moderna Sars-Covid-2 Vaccination 02/08/2020, 03/07/2020, 11/25/2020   PNEUMOCOCCAL CONJUGATE-20 06/10/2021   PPD Test 01/27/2017   Pneumococcal Polysaccharide-23 01/25/2019   Respiratory Syncytial Virus Vaccine,Recomb Aduvanted(Arexvy) 12/16/2022   Tdap 06/05/2015   Zoster Recombinant(Shingrix) 06/20/2017, 08/30/2017    TDAP status: Up to date  Flu Vaccine status: Up to date  Pneumococcal vaccine status: Up to date  Covid-19 vaccine status: Completed vaccines  Qualifies for Shingles Vaccine? Yes   Zostavax completed Yes   Shingrix Completed?: Yes  Screening Tests Health Maintenance  Topic Date Due   OPHTHALMOLOGY EXAM  05/28/2022   COVID-19 Vaccine (7 - 2023-24 season) 10/10/2022   INFLUENZA VACCINE  07/27/2023   HEMOGLOBIN A1C  07/27/2023   Diabetic kidney evaluation - eGFR measurement  01/27/2024   Diabetic kidney evaluation - Urine ACR  01/27/2024   FOOT EXAM  01/27/2024   Lung Cancer Screening  04/23/2024   Medicare Annual Wellness (AWV)  06/27/2024   MAMMOGRAM  02/14/2025   DTaP/Tdap/Td (2 - Td or Tdap) 06/04/2025   Colonoscopy  06/16/2031    Pneumonia Vaccine 107+ Years old  Completed   DEXA SCAN  Completed   Hepatitis C Screening  Completed   Zoster Vaccines- Shingrix  Completed   HPV VACCINES  Aged Out    Health Maintenance  Health Maintenance Due  Topic Date Due   OPHTHALMOLOGY EXAM  05/28/2022   COVID-19 Vaccine (7 - 2023-24 season) 10/10/2022    Colorectal cancer screening: Type of screening: Colonoscopy. Completed 06/15/21. Repeat every 10 years  Mammogram status: Completed 01/2023. Repeat every year  Bone Density status: Completed 06/29/21. Results reflect: Bone density results: NORMAL. Repeat every 2 years.  Lung Cancer Screening: (Low Dose CT Chest recommended if Age 63-80 years, 20 pack-year currently smoking OR have quit w/in 15years.) does qualify.   Lung Cancer Screening Referral: ordered 05/04/23  Additional Screening:  Hepatitis C Screening: Completed 06/20/17  Vision Screening: Recommended annual ophthalmology exams for early detection of glaucoma and other disorders of the eye. Is the patient up to date with their annual eye exam?  Yes  per pt stated 02/2023 Who is the provider or what is the name of the office in which the patient attends annual eye exams? [facility]  If pt is not established with a provider, would they like to be referred to a provider to establish care? No .   Dental Screening: Recommended annual dental exams for proper oral hygiene  Diabetic Foot Exam: Diabetic Foot Exam: Completed 01/26/23  Community Resource Referral / Chronic Care Management: CRR required this visit?  No   CCM required this visit?  No     Plan:     I have personally reviewed and noted the following in the patient's chart:   Medical and social history Use of alcohol, tobacco or illicit drugs  Current medications and supplements including opioid prescriptions. Patient is not currently taking opioid prescriptions. Functional ability and status Nutritional status Physical activity Advanced directives List  of other physicians Hospitalizations, surgeries, and ER visits in previous 12 months Vitals Screenings to include cognitive, depression, and falls Referrals and appointments  In addition, I have reviewed and discussed with patient certain preventive protocols, quality metrics, and best practice recommendations. A written personalized care plan for preventive services as well as general preventive health recommendations were provided to patient.     Marzella Schlein, LPN  06/28/2023   After Visit Summary: (MyChart) Due to this being a telephonic visit, the after visit summary with patients personalized plan was offered to patient via MyChart   Nurse Notes: none

## 2023-06-30 ENCOUNTER — Ambulatory Visit: Payer: Medicare Other | Admitting: Nurse Practitioner

## 2023-07-03 ENCOUNTER — Ambulatory Visit: Payer: Medicare Other | Admitting: Nurse Practitioner

## 2023-07-27 ENCOUNTER — Ambulatory Visit: Payer: Medicare Other | Admitting: Family Medicine

## 2023-07-27 DIAGNOSIS — E119 Type 2 diabetes mellitus without complications: Secondary | ICD-10-CM | POA: Diagnosis not present

## 2023-08-03 NOTE — Patient Instructions (Signed)
It was very nice to see you today!   PLEASE NOTE:    Please give ample time to the testing facility, and our office to run, receive and review results. Please do not call inquiring of results, even if you can see them in your chart. We will contact you as soon as we are able. If it has been over 1 week since the test was completed, and you have not yet heard from [place], then please call us.   If we ordered any referrals today, please let us know if you have not heard from their office within the next 2 weeks. You should receive a letter via MyChart confirming if the referral was approved and their office contact information to schedule.

## 2023-08-04 ENCOUNTER — Ambulatory Visit (INDEPENDENT_AMBULATORY_CARE_PROVIDER_SITE_OTHER): Payer: Medicare Other | Admitting: Family Medicine

## 2023-08-04 ENCOUNTER — Telehealth: Payer: Self-pay

## 2023-08-04 ENCOUNTER — Encounter: Payer: Self-pay | Admitting: Family Medicine

## 2023-08-04 VITALS — BP 101/64 | HR 58 | Wt 340.2 lb

## 2023-08-04 DIAGNOSIS — H6993 Unspecified Eustachian tube disorder, bilateral: Secondary | ICD-10-CM

## 2023-08-04 DIAGNOSIS — R7303 Prediabetes: Secondary | ICD-10-CM

## 2023-08-04 DIAGNOSIS — K909 Intestinal malabsorption, unspecified: Secondary | ICD-10-CM | POA: Diagnosis not present

## 2023-08-04 DIAGNOSIS — Z6841 Body Mass Index (BMI) 40.0 and over, adult: Secondary | ICD-10-CM

## 2023-08-04 LAB — POCT GLYCOSYLATED HEMOGLOBIN (HGB A1C)
HbA1c POC (<> result, manual entry): 5.7 % (ref 4.0–5.6)
HbA1c, POC (controlled diabetic range): 5.7 % (ref 0.0–7.0)
HbA1c, POC (prediabetic range): 5.7 % (ref 5.7–6.4)
Hemoglobin A1C: 5.7 % — AB (ref 4.0–5.6)

## 2023-08-04 LAB — BASIC METABOLIC PANEL
BUN: 17 mg/dL (ref 6–23)
CO2: 28 mEq/L (ref 19–32)
Calcium: 9.5 mg/dL (ref 8.4–10.5)
Chloride: 107 mEq/L (ref 96–112)
Creatinine, Ser: 0.89 mg/dL (ref 0.40–1.20)
GFR: 67.05 mL/min (ref >60.00)
Glucose, Bld: 101 mg/dL — ABNORMAL HIGH (ref 70–99)
Potassium: 4 mEq/L (ref 3.5–5.1)
Sodium: 142 mEq/L (ref 135–145)

## 2023-08-04 LAB — LIPID PANEL
Cholesterol: 184 mg/dL (ref 0–200)
HDL: 77.4 mg/dL (ref >39.00)
LDL Cholesterol: 86 mg/dL (ref 0–99)
NonHDL: 107.07
Total CHOL/HDL Ratio: 2
Triglycerides: 104 mg/dL (ref 0.0–149.0)
VLDL: 20.8 mg/dL (ref 0.0–40.0)

## 2023-08-04 LAB — VITAMIN B12: Vitamin B-12: 283 pg/mL (ref 211–911)

## 2023-08-04 MED ORDER — TIRZEPATIDE 2.5 MG/0.5ML ~~LOC~~ SOAJ: 2.5000 mg | SUBCUTANEOUS | 0 refills | Status: DC

## 2023-08-04 MED ORDER — SEMAGLUTIDE(0.25 OR 0.5MG/DOS) 2 MG/3ML ~~LOC~~ SOPN: PEN_INJECTOR | SUBCUTANEOUS | 0 refills | Status: DC

## 2023-08-04 MED ORDER — SEMAGLUTIDE(0.25 OR 0.5MG/DOS) 2 MG/3ML ~~LOC~~ SOPN: 0.5000 mg | PEN_INJECTOR | SUBCUTANEOUS | Status: DC

## 2023-08-04 MED ORDER — ONETOUCH DELICA PLUS LANCET30G MISC
12 refills | Status: DC
Start: 2023-08-04 — End: 2024-08-09

## 2023-08-04 MED ORDER — METFORMIN HCL 1000 MG PO TABS: ORAL_TABLET | ORAL | 1 refills | Status: DC

## 2023-08-04 MED ORDER — ONETOUCH ULTRA VI STRP: ORAL_STRIP | 12 refills | Status: DC

## 2023-08-04 NOTE — Telephone Encounter (Signed)
Please further advise.

## 2023-08-04 NOTE — Addendum Note (Signed)
Addended by: Emi Holes D on: 08/04/2023 01:14 PM   Modules accepted: Orders

## 2023-08-04 NOTE — Telephone Encounter (Signed)
Pt made aware. Pt will be by sometime today to pick up sample.

## 2023-08-04 NOTE — Telephone Encounter (Signed)
Prescription for Ozempic sent. However, offer her one of our samples, 0.5 mg injection every 7 days. -thx

## 2023-08-04 NOTE — Progress Notes (Signed)
OFFICE VISIT  08/04/2023  CC:  Chief Complaint  Patient presents with   Medical Management of Chronic Issues    Pt had an Echo done in April, there was concern for PAH. Is scheduled to meet with cardiologist.    Patient is a 67 y.o. female who presents for f/u prediabetes.  INTERIM HX: Home glucoses in the 100-120 range when fasting. She asks about Mounjaro for weight loss.  Feels like her ears are blocked up.  Onset a few weeks ago in the setting of a URI.  She then went on a plane flight. No pain in the ears.  ROS as above, plus--> no fevers, no CP, no SOB, no wheezing, no cough, no dizziness, no HAs, no rashes, no melena/hematochezia.  No polyuria or polydipsia.  Chronic knee pain.  No focal weakness, paresthesias, or tremors.  No acute vision or hearing abnormalities.  No dysuria or unusual/new urinary urgency or frequency.  No recent changes in lower legs. No n/v/d or abd pain.  No palpitations.     Past Medical History:  Diagnosis Date   BPPV (benign paroxysmal positional vertigo) 01/30/2014   CAP (community acquired pneumonia) 10/2018   with acute hypoxic RF, empyema-->VATS   Cyst, breast 01/2012; 06/2012; 02/26/13; 03/03/14   Complicated cysts in upper outer left breast--no evidence of malignancy--f/u bilat diag mammo/left breast u/s on 03/06/15 showed resolution of left breast cysts.  Repeat screening mammogram 1 yr.   Empyema (HCC)    drained by CT surgery/VATS.  Full recovery as of 12/2018 CT surgery f/u visit.   H/O allergic rhinitis    Dr. Willa Rough   Hepatic cyst 2019   Noted on CT chest imaging.  Multiple, small, "simple"   History of pneumonia 2009 and 2010   supplemential oxygen d/c'd 12/16/18 by pulm   Mild persistent asthma, well controlled 2014   New adult onset asthma after respiratory infection (Dr. Willa Rough)   Morbid obesity (HCC)    BMI 50+.  At one point in time she was going to Bariatric clinic on [address] and got weekly HCG injections, monthly vit B12  injections, and took phentermine daily.  As of 05/2015 she was no longer doing this.   Nephrolithiasis 04/2018 first episode   [facility]. Lil[facility] Shi ([facility]): 1.2mm right UVJ stone + 6 mm L AML.  Pt elected for trial of passage.   OSA (obstructive sleep apnea) 10/2018   Mild/mod OSA 12/2018--pt not able to afford CPAP as of 01/2019.   Osteoarthritis of both knees    No improvement with steroid injections; did synvisc x 3 yrs; bilat replacement was recommended but she declined.   Prediabetes    she's on metformin   Pulmonary nodule 06/29/2021   on lung ca screening CT->repeat 71mo.  This has been stable as of 03/2023   Recurrent low back pain    Thyroid nodule 03/2022   1 on each side, needs rpt u/s 03/2023    Past Surgical History:  Procedure Laterality Date   APPENDECTOMY  1978   CARDIAC CATHETERIZATION  2010   Normal coronaries per pt   CARDIOVASCULAR STRESS TEST  2010   abnl per pt; f/u cath clean   CESAREAN SECTION     x 3   CHOLECYSTECTOMY  1986   COLONOSCOPY  2011   lipoma and small diminutive polyp in rectum (in [place])-->[facility] to do repeat as of 04/05/21   COLONOSCOPY  06/15/2021   Normal-recall 10 yrs   COLONOSCOPY WITH PROPOFOL N/A  06/15/2021   Procedure: COLONOSCOPY WITH PROPOFOL;  Surgeon: Kathi Der, MD;  Location: [facility];  Service: Gastroenterology;  Laterality: N/A;   DEXA  06/2021   06/2021 NORMAL-> rpt 2 yrs.   EMPYEMA DRAINAGE Left 10/24/2018   Evacuation of empyema with decortication.  Procedure: EMPYEMA DRAINAGE;  Surgeon: Delight Ovens, MD;  Location: [facility] OR;  Service: Thoracic;  Laterality: Left;   LUMBAR DISC SURGERY  1993   L5/S1   TONSILLECTOMY AND ADENOIDECTOMY  1967ish   TOTAL ABDOMINAL HYSTERECTOMY  1994   Ovaries are still in.  This was done for DUB/fibroids.   TRANSTHORACIC ECHOCARDIOGRAM     05/2023 NORMAL   VIDEO ASSISTED THORACOSCOPY (VATS)/THOROCOTOMY Left 10/24/2018   Procedure: VIDEO ASSISTED THORACOSCOPY (VATS)/THOROCOTOMY with EVACUATION  OF EMPYEMA AND DECORTICATION.;  Surgeon: Delight Ovens, MD;  Location: [facility] OR;  Service: Thoracic;  Laterality: Left;   VIDEO BRONCHOSCOPY N/A 10/24/2018   Procedure: VIDEO BRONCHOSCOPY;  Surgeon: Delight Ovens, MD;  Location: [facility] OR;  Service: Thoracic;  Laterality: N/A;    Outpatient Medications Prior to Visit  Medication Sig Dispense Refill   albuterol (VENTOLIN HFA) 108 (90 Base) MCG/ACT inhaler Inhale 2 puffs into the lungs every 6 (six) hours as needed for wheezing or shortness of breath. 8 g 6   Ascorbic Acid (VITAMIN C) 1000 MG tablet Take 1,000 mg by mouth daily.     beclomethasone (QVAR REDIHALER) 40 MCG/ACT inhaler Inhale 2 puffs into the lungs 2 (two) times daily. 1 each 1   blood glucose meter kit and supplies KIT Use to check glucose 1-2 times daily 1 each 0   cholecalciferol (VITAMIN D) 25 MCG (1000 UNIT) tablet Take 1,000 Units by mouth daily.     COLLAGEN PO Take by mouth.     Fexofenadine HCl (ALLEGRA PO) Take by mouth daily.     fluticasone (FLONASE) 50 MCG/ACT nasal spray Place 1 spray into both nostrils daily. 16 g 2   magnesium gluconate (MAGONATE) 500 MG tablet Take 500 mg by mouth daily.     Multiple Vitamin (MULTIVITAMIN) tablet Take 1 tablet by mouth daily.     OVER THE COUNTER MEDICATION daily. GOLO     Lancets (ONETOUCH DELICA PLUS LANCET30G) MISC USE TO CHECK BLOOD SUGAR ONCE DAILY 100 each 12   metFORMIN (GLUCOPHAGE) 1000 MG tablet TAKE 1 TABLET (1,000 MG TOTAL) BY MOUTH TWICE A DAY WITH FOOD 180 tablet 1   ONETOUCH ULTRA test strip USE TO CHECK BLOOD SUGAR ONCE DAILY 100 strip 12   No facility-administered medications prior to visit.    Allergies  Allergen Reactions   Penicillins Nausea Only and Rash    Has patient had a PCN reaction causing immediate rash, facial/tongue/throat swelling, SOB or lightheadedness with hypotension: yES Has patient had a PCN reaction causing severe rash involving mucus membranes or skin necrosis: No Has patient had a  PCN reaction that required hospitalization: No Has patient had a PCN reaction occurring within the last 10 years: No If all of the above answers are "NO", then may proceed with Cephalosporin use. CC    Review of Systems As per HPI  PE:    08/04/2023    8:06 AM 06/28/2023    1:00 PM 03/31/2023    1:38 PM  Vitals with BMI  Height   5\' 7"   Weight 340 lbs 3 oz 340 lbs 340 lbs 6 oz  BMI   53.3  Systolic 101  126  Diastolic 64  62  Pulse 58  68     Physical Exam  Gen: Alert, well appearing.  Patient is oriented to person, place, time, and situation. AFFECT: pleasant, lucid thought and speech. EARS: Normal ear canals and normal tympanic membranes.  No fluid in middle ear. No further exam today  LABS:  Last CBC Lab Results  Component Value Date   WBC 9.3 01/26/2023   HGB 12.9 01/26/2023   HCT 39.7 01/26/2023   MCV 85.6 01/26/2023   MCH 26.3 10/31/2018   RDW 15.7 (H) 01/26/2023   PLT 288.0 01/26/2023   Last metabolic panel Lab Results  Component Value Date   GLUCOSE 106 (H) 01/26/2023   NA 144 01/26/2023   K 4.3 01/26/2023   CL 107 01/26/2023   CO2 31 01/26/2023   BUN 15 01/26/2023   CREATININE 0.85 01/26/2023   GFR 71.11 01/26/2023   CALCIUM 9.5 01/26/2023   PHOS 2.8 10/26/2018   PROT 6.5 01/26/2023   ALBUMIN 4.0 01/26/2023   BILITOT 0.4 01/26/2023   ALKPHOS 66 01/26/2023   AST 14 01/26/2023   ALT 12 01/26/2023   ANIONGAP 7 10/30/2018   Last lipids Lab Results  Component Value Date   CHOL 186 01/26/2023   HDL 83.90 01/26/2023   LDLCALC 82 01/26/2023   TRIG 98.0 01/26/2023   CHOLHDL 2 01/26/2023   Last hemoglobin A1c Lab Results  Component Value Date   HGBA1C 5.7 (A) 08/04/2023   HGBA1C 5.7 08/04/2023   HGBA1C 5.7 08/04/2023   HGBA1C 5.7 08/04/2023   Last thyroid functions Lab Results  Component Value Date   TSH 1.35 01/26/2023   IMPRESSION AND PLAN:  #1 prediabetes. Doing well on metformin 1000 mg twice a day. POC Hba1c today is  5.7%. Checking vitamin B12 level today due to potential decreased absorption on chronic metformin therapy.  #2 eustachian tube dysfunction bilaterally. She will start Flonase 2 sprays each nostril daily.    #3 obesity, BMI is 53. She has significant knee arthritis and impaired ambulation. She asks about Mounjaro and I printed the prescription for this today to see how much her out of pocket cost would be.  I wrote out the names of others to check for insurance coverage specifics: Saxenda, Ozempic, and Trulicity. If she does get on 1 of these medications she will stop her metformin and she will follow-up in 1 month. An After Visit Summary was printed and given to the patient.  FOLLOW UP: Return in about 6 months (around 02/04/2024) for annual CPE (fasting). Next CPE 01/2024 Signed:  Santiago Bumpers, MD           08/04/2023

## 2023-08-04 NOTE — Telephone Encounter (Signed)
Patient was just seen by Dr. Milinda Cave. She is at [facility].  Greggory Keen is not covered by her insurance but Ozempic is. Patient would like Ozempic to be called into pharmacy as soon as possible.  Please call patient back when complete [phone].

## 2023-08-07 ENCOUNTER — Telehealth: Payer: Self-pay

## 2023-08-07 ENCOUNTER — Ambulatory Visit: Payer: Medicare Other | Admitting: Nurse Practitioner

## 2023-08-07 NOTE — Telephone Encounter (Signed)
Approval for Ozempic complete

## 2023-08-07 NOTE — Telephone Encounter (Addendum)
Taylor Leon with [facility] [employer] called to confirm approval for  Medication: Ozempic 0.25/0.5mg  Approval#: [id number]  Approval Date:08/07/2023-08/06/2024  Patient has been contacted by [facility] and made aware of the approval.

## 2023-08-07 NOTE — Telephone Encounter (Signed)
[name] (Key: [id number]) [id number] Ozempic (0.25 or 0.5 MG/DOSE) 2MG Ronny Bacon pen-injectors Status: PA Request Created: August 9th, 2024 [phone] Sent: August 12th, 2024

## 2023-08-08 NOTE — Telephone Encounter (Signed)
Noted  

## 2023-08-09 ENCOUNTER — Other Ambulatory Visit (HOSPITAL_COMMUNITY): Payer: Self-pay

## 2023-08-14 ENCOUNTER — Ambulatory Visit: Payer: Medicare Other

## 2023-08-14 DIAGNOSIS — R7303 Prediabetes: Secondary | ICD-10-CM

## 2023-08-14 NOTE — Progress Notes (Signed)
Patient came in today for Ozempic teaching. It was demonstrated to patient with training pen:   Patient instructed to clean area with alcohol swab in circular motion Attach needle and prime with first use of pen Turn dial until seeing the dosage prescribed. Then remove caps. Hold pen up to subcutaneous area (preferably back of thigh) & push button the bottom of the pen. When she see the number back to 0, she should hold pen in place for 10 additional seconds. Then injection is done & she can discard needle in sharps container/milk jug.   Pt states understanding and completed  1st Ozempic injection( 0.5mg )  in office today If she has further questions she will call office.

## 2023-08-27 DIAGNOSIS — E119 Type 2 diabetes mellitus without complications: Secondary | ICD-10-CM | POA: Diagnosis not present

## 2023-09-04 ENCOUNTER — Ambulatory Visit (HOSPITAL_BASED_OUTPATIENT_CLINIC_OR_DEPARTMENT_OTHER): Payer: Medicare Other | Admitting: Pulmonary Disease

## 2023-09-04 ENCOUNTER — Encounter (HOSPITAL_BASED_OUTPATIENT_CLINIC_OR_DEPARTMENT_OTHER): Payer: Self-pay | Admitting: Pulmonary Disease

## 2023-09-04 VITALS — BP 132/68 | HR 66 | Resp 16 | Ht 67.0 in | Wt 335.2 lb

## 2023-09-04 DIAGNOSIS — R911 Solitary pulmonary nodule: Secondary | ICD-10-CM | POA: Diagnosis not present

## 2023-09-04 DIAGNOSIS — G4733 Obstructive sleep apnea (adult) (pediatric): Secondary | ICD-10-CM

## 2023-09-04 DIAGNOSIS — I2729 Other secondary pulmonary hypertension: Secondary | ICD-10-CM | POA: Insufficient documentation

## 2023-09-04 DIAGNOSIS — J45991 Cough variant asthma: Secondary | ICD-10-CM | POA: Diagnosis not present

## 2023-09-04 DIAGNOSIS — Z9189 Other specified personal risk factors, not elsewhere classified: Secondary | ICD-10-CM

## 2023-09-04 NOTE — Progress Notes (Signed)
   Subjective:    Patient ID: Taylor Leon, female    DOB: 08/30/1956, 67 y.o.   MRN: [id number]  HPI  yo morbidly obese woman for FU of OSA & chronic cough -7mm RML nodule, stable since 2019    She has a chronic cough and intermittent wheezing since she moved from [place] to [place] in 2012.  ENT evaluation was negative.  Dr. Willa Rough allergist diagnosed her with "allergy induced asthma"  Last flareup was in 08/2018.  She uses Qvar during spring and fall.   PMH: 09/2018 VATS thoracotomy-parapneumonic effusion   86-month follow-up visit. We reviewed CT chest which showed stable nodule but detected an enlarged pulmonary artery and follow-up echocardiogram which showed mildly enlarged RV and mildly increased RVSP Unfortunately she was stung by a bee on her left abdomen on her way here.  Applied ice No problems with CPAP mask or pressure, very compliant  Significant tests/ events reviewed 11/2018 NPSG -  Wt 328 lbs  -mod OSA -RDI 21/h, AHI 9/h 04/2021 HST AHI 14/h   PFTs 01/2019 nml   09/2018 CT angiogram chest left lower lobe pneumonia.  06/2021 LDCT chest-RADS 3, 7 mm nodule right middle lobe   01/2022 CT chest without contrast stable nodule, left thyroid nodule  CT chest without con 03/2023 9 mm thyroid nodule, stable lung nodule, enlarged pulmonary trunk.  Echo 05/2023, enlarged RV, RVSP 32, normal LVEF  Review of Systems neg for any significant sore throat, dysphagia, itching, sneezing, nasal congestion or excess/ purulent secretions, fever, chills, sweats, unintended wt loss, pleuritic or exertional cp, hempoptysis, orthopnea pnd or change in chronic leg swelling. Also denies presyncope, palpitations, heartburn, abdominal pain, nausea, vomiting, diarrhea or change in bowel or urinary habits, dysuria,hematuria, rash, arthralgias, visual complaints, headache, numbness weakness or ataxia.     Objective:   Physical Exam  Gen. Pleasant, obese, in no distress ENT - no lesions, no  post nasal drip Neck: No JVD, no thyromegaly, no carotid bruits Lungs: no use of accessory muscles, no dullness to percussion, decreased without rales or rhonchi  Cardiovascular: Rhythm regular, heart sounds  normal, no murmurs or gallops, no peripheral edema Musculoskeletal: No deformities, no cyanosis or clubbing , no tremors Abdomen -2 cm wheal around sting       Assessment & Plan:

## 2023-09-04 NOTE — Assessment & Plan Note (Signed)
CPAP download was reviewed which shows excellent control of events on auto settings with average pressure of 11 and max pressure of 12.6 cm.  She is very compliant.  CPAP certainly helped improve her daytime somnolence and fatigue Weight loss encouraged, compliance with goal of at least 4-6 hrs every night is the expectation. Advised against medications with sedative side effects Cautioned against driving when sleepy - understanding that sleepiness will vary on a day to day basis

## 2023-09-04 NOTE — Assessment & Plan Note (Signed)
Very mild, likely related to obesity and OSA. No symptoms pedal edema or increased dyspnea, can follow clinically and with echo in 1 year

## 2023-09-04 NOTE — Assessment & Plan Note (Signed)
Continue strategy of using Allegra and Qvar as needed during fall

## 2023-09-04 NOTE — Assessment & Plan Note (Signed)
Stable and considered benign

## 2023-09-04 NOTE — Patient Instructions (Signed)
Nodule is stable on CT scan. We discussed finding of very mild pulmonary hypertension CPAP is working well  Use Allegra and Qvar as needed during fall

## 2023-09-05 ENCOUNTER — Other Ambulatory Visit: Payer: Self-pay | Admitting: Family Medicine

## 2023-09-05 NOTE — Telephone Encounter (Signed)
Pt was given 28 day supply on 8/9 for starter dose of Ozempic, next follow up 9/19. PA completed but sample provided prior to completion for another 28 day supply.   Refill should not currently be needed.

## 2023-09-11 ENCOUNTER — Encounter: Payer: Self-pay | Admitting: Cardiology

## 2023-09-11 ENCOUNTER — Ambulatory Visit: Payer: Medicare Other | Attending: Cardiovascular Disease | Admitting: Cardiology

## 2023-09-11 VITALS — BP 116/64 | HR 55 | Ht 67.0 in | Wt 339.2 lb

## 2023-09-11 DIAGNOSIS — R7303 Prediabetes: Secondary | ICD-10-CM

## 2023-09-11 DIAGNOSIS — I288 Other diseases of pulmonary vessels: Secondary | ICD-10-CM

## 2023-09-11 DIAGNOSIS — Z01812 Encounter for preprocedural laboratory examination: Secondary | ICD-10-CM | POA: Diagnosis not present

## 2023-09-11 DIAGNOSIS — R0609 Other forms of dyspnea: Secondary | ICD-10-CM

## 2023-09-11 DIAGNOSIS — Z6841 Body Mass Index (BMI) 40.0 and over, adult: Secondary | ICD-10-CM

## 2023-09-11 NOTE — Progress Notes (Signed)
Cardiology Office Note:    Date:  09/15/2023   ID:  [name], DOB March 09, 1956, MRN [id number]  PCP:  Jeoffrey Massed, MD  Cardiologist:  Thomasene Ripple, DO  Electrophysiologist:  None   Referring MD: Jeoffrey Massed, MD   " I am ok"  History of Present Illness:    Taylor Leon is a 67 y.o. female with a hx of mild persistent asthma, emphysema, PPV, prediabetes, former smoker, osteoarthritis here today to be evaluated for worsening shortness of breath.  She was referred by her PCP as she has been experiencing these episodes of shortness of breath and echocardiogram back in June which show abnormal wall motion.  She has just been able to see cardiology. She reports that she is experiencing increasing shortness of breath on exertion.  Is concerned about this.  She does not have any specific report of chest discomfort.   Past Medical History:  Diagnosis Date   BPPV (benign paroxysmal positional vertigo) 01/30/2014   CAP (community acquired pneumonia) 10/2018   with acute hypoxic RF, empyema-->VATS   Cyst, breast 01/2012; 06/2012; 02/26/13; 03/03/14   Complicated cysts in upper outer left breast--no evidence of malignancy--f/u bilat diag mammo/left breast u/s on 03/06/15 showed resolution of left breast cysts.  Repeat screening mammogram 1 yr.   Empyema (HCC)    drained by CT surgery/VATS.  Full recovery as of 12/2018 CT surgery f/u visit.   H/O allergic rhinitis    Dr. Willa Rough   Hepatic cyst 2019   Noted on CT chest imaging.  Multiple, small, "simple"   History of pneumonia 2009 and 2010   supplemential oxygen d/c'd 12/16/18 by pulm   Mild persistent asthma, well controlled 2014   New adult onset asthma after respiratory infection (Dr. Willa Rough)   Morbid obesity (HCC)    BMI 50+.  At one point in time she was going to Bariatric clinic on [address] and got weekly HCG injections, monthly vit B12 injections, and took phentermine daily.  As of 05/2015 she was no longer doing this.    Nephrolithiasis 04/2018 first episode   Dr. Liliane Shi ([facility]): 1.86mm right UVJ stone + 6 mm L AML.  Pt elected for trial of passage.   OSA (obstructive sleep apnea) 10/2018   Mild/mod OSA 12/2018--pt not able to afford CPAP as of 01/2019.   Osteoarthritis of both knees    No improvement with steroid injections; did synvisc x 3 yrs; bilat replacement was recommended but she declined.   Prediabetes    she's on metformin   Pulmonary nodule 06/29/2021   on lung ca screening CT->repeat 33mo.  This has been stable as of 03/2023   Recurrent low back pain    Thyroid nodule 03/2022   1 on each side, needs rpt u/s 03/2023    Past Surgical History:  Procedure Laterality Date   APPENDECTOMY  1978   CARDIAC CATHETERIZATION  2010   Normal coronaries per pt   CARDIOVASCULAR STRESS TEST  2010   abnl per pt; f/u cath clean   CESAREAN SECTION     x 3   CHOLECYSTECTOMY  1986   COLONOSCOPY  2011   lipoma and small diminutive polyp in rectum (in [place])-->[facility] to do repeat as of 04/05/21   COLONOSCOPY  06/15/2021   Normal-recall 10 yrs   COLONOSCOPY WITH PROPOFOL N/A 06/15/2021   Procedure: COLONOSCOPY WITH PROPOFOL;  Surgeon: Kathi Der, MD;  Location: [facility];  Service: Gastroenterology;  Laterality: N/A;   DEXA  06/2021   06/2021 NORMAL-> rpt 2 yrs.   EMPYEMA DRAINAGE Left 10/24/2018   Evacuation of empyema with decortication.  Procedure: EMPYEMA DRAINAGE;  Surgeon: Delight Ovens, MD;  Location: [facility] OR;  Service: Thoracic;  Laterality: Left;   LUMBAR DISC SURGERY  1993   L5/S1   TONSILLECTOMY AND ADENOIDECTOMY  1967ish   TOTAL ABDOMINAL HYSTERECTOMY  1994   Ovaries are still in.  This was done for DUB/fibroids.   TRANSTHORACIC ECHOCARDIOGRAM     05/2023 NORMAL   VIDEO ASSISTED THORACOSCOPY (VATS)/THOROCOTOMY Left 10/24/2018   Procedure: VIDEO ASSISTED THORACOSCOPY (VATS)/THOROCOTOMY with EVACUATION OF EMPYEMA AND DECORTICATION.;  Surgeon: Delight Ovens, MD;  Location: [facility] OR;   Service: Thoracic;  Laterality: Left;   VIDEO BRONCHOSCOPY N/A 10/24/2018   Procedure: VIDEO BRONCHOSCOPY;  Surgeon: Delight Ovens, MD;  Location: [facility] OR;  Service: Thoracic;  Laterality: N/A;    Current Medications: Current Meds  Medication Sig   albuterol (VENTOLIN HFA) 108 (90 Base) MCG/ACT inhaler Inhale 2 puffs into the lungs every 6 (six) hours as needed for wheezing or shortness of breath.   Ascorbic Acid (VITAMIN C) 1000 MG tablet Take 1,000 mg by mouth daily.   beclomethasone (QVAR REDIHALER) 40 MCG/ACT inhaler Inhale 2 puffs into the lungs 2 (two) times daily.   blood glucose meter kit and supplies KIT Use to check glucose 1-2 times daily   cholecalciferol (VITAMIN D) 25 MCG (1000 UNIT) tablet Take 1,000 Units by mouth daily.   COLLAGEN PO Take by mouth.   fluticasone (FLONASE) 50 MCG/ACT nasal spray Place 1 spray into both nostrils daily. (Patient taking differently: Place 1 spray into both nostrils daily as needed for allergies.)   glucose blood (ONETOUCH ULTRA) test strip USE TO CHECK BLOOD SUGAR ONCE DAILY   Lancets (ONETOUCH DELICA PLUS LANCET30G) MISC USE TO CHECK BLOOD SUGAR ONCE DAILY   magnesium gluconate (MAGONATE) 500 MG tablet Take 500 mg by mouth daily.   Multiple Vitamin (MULTIVITAMIN) tablet Take 1 tablet by mouth daily.   OVER THE COUNTER MEDICATION Take 1 tablet by mouth in the morning and at bedtime. GOLO Tablets   [DISCONTINUED] Fexofenadine HCl (ALLEGRA PO) Take by mouth daily.   [DISCONTINUED] Semaglutide,0.25 or 0.5MG /DOS, 2 MG/3ML SOPN 0.5mg  SQ q week     Allergies:   Penicillins   Social History   Socioeconomic History   Marital status: Married    Spouse name: Not on file   Number of children: 4   Years of education: Not on file   Highest education level: Bachelor's degree (e.g., BA, AB, BS)  Occupational History   Not on file  Tobacco Use   Smoking status: Former    Current packs/day: 0.00    Average packs/day: 0.7 packs/day for 34.0 years  (25.5 ttl pk-yrs)    Types: Cigarettes    Start date: 08/1976    Quit date: 12/28/2009    Years since quitting: 13.7    Passive exposure: Past   Smokeless tobacco: Never  Vaping Use   Vaping status: Never Used  Substance and Sexual Activity   Alcohol use: Yes    Comment: social   Drug use: No   Sexual activity: Not on file  Other Topics Concern   Not on file  Social History Narrative   Married, 4 grown children.   Relocated to [place], [place] from [place] about 2012.   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   Exercise: intermittently does water aerobics..            Social Determinants of Health   Financial Resource Strain: Low Risk  (06/28/2023)   Overall Financial Resource Strain (CARDIA)    Difficulty of Paying Living Expenses: Not hard at all  Food Insecurity: No Food Insecurity (06/28/2023)   Hunger Vital Sign    Worried About Running Out of Food in the Last Year: Never true    Ran Out of Food in the Last Year: Never true  Transportation Needs: No Transportation Needs (06/28/2023)   PRAPARE - Administrator, Civil Service (Medical): No    Lack of Transportation (Non-Medical): No  Physical Activity: Inactive (06/28/2023)   Exercise Vital Sign    Days of Exercise per Week: 0 days    Minutes of Exercise per Session: 0 min  Stress: No Stress Concern Present (06/28/2023)   [employer] of Occupational Health - Occupational Stress Questionnaire    Feeling of Stress : Not at all  Social Connections: Socially Integrated (06/28/2023)   Social Connection and Isolation Panel [NHANES]    Frequency of Communication with Friends and Family: More than three times a week    Frequency of Social Gatherings with Friends and Family: More than three times a week    Attends Religious Services: More than 4 times per year    Active Member of [employer] or Organizations: Yes    Attends Banker Meetings: 1  to 4 times per year    Marital Status: Married     Family History: The patient's family history includes Arthritis in her mother and paternal grandmother; Colon cancer in her mother; Diabetes in her mother; Heart disease (age of onset: 70) in her mother; Hypertension in her mother. There is no history of Breast cancer.  ROS:   Review of Systems  Constitution: Negative for decreased appetite, fever and weight gain.  HENT: Negative for congestion, ear discharge, hoarse voice and sore throat.   Eyes: Negative for discharge, redness, vision loss in right eye and visual halos.  Cardiovascular: Negative for chest pain, dyspnea on exertion, leg swelling, orthopnea and palpitations.  Respiratory: Negative for cough, hemoptysis, shortness of breath and snoring.   Endocrine: Negative for heat intolerance and polyphagia.  Hematologic/Lymphatic: Negative for bleeding problem. Does not bruise/bleed easily.  Skin: Negative for flushing, nail changes, rash and suspicious lesions.  Musculoskeletal: Negative for arthritis, joint pain, muscle cramps, myalgias, neck pain and stiffness.  Gastrointestinal: Negative for abdominal pain, bowel incontinence, diarrhea and excessive appetite.  Genitourinary: Negative for decreased libido, genital sores and incomplete emptying.  Neurological: Negative for brief paralysis, focal weakness, headaches and loss of balance.  Psychiatric/Behavioral: Negative for altered mental status, depression and suicidal ideas.  Allergic/Immunologic: Negative for HIV exposure and persistent infections.    EKGs/Labs/Other Studies Reviewed:    The following studies were reviewed today:   EKG:  The ekg ordered today demonstrates sinus bradycardia, HR 55 bpm  Recent Labs: 01/26/2023: ALT 12; TSH 1.35 09/11/2023: BUN 17; Creatinine, Ser 0.79; Hemoglobin 13.2; Magnesium 1.9; Platelets 270; Potassium 4.6; Sodium 144  Recent Lipid Panel    Component Value Date/Time   CHOL 184 08/04/2023  0827   TRIG 104.0 08/04/2023 0827   HDL 77.40 08/04/2023 0827   CHOLHDL 2 08/04/2023 0827   VLDL 20.8 08/04/2023 0827   LDLCALC 86 08/04/2023 0827    Physical Exam:    VS:  BP 116/64 (BP Location: Left Arm, Patient  Position: Sitting, Cuff Size: Large)   Pulse (!) 55   Ht 5\' 7"  (1.702 m)   Wt (!) 339 lb 3.2 oz (153.9 kg)   SpO2 96%   BMI 53.13 kg/m     Wt Readings from Last 3 Encounters:  09/14/23 (!) 338 lb 9.6 oz (153.6 kg)  09/11/23 (!) 339 lb 3.2 oz (153.9 kg)  09/04/23 (!) 335 lb 3.2 oz (152 kg)     GEN: Well nourished, well developed in no acute distress HEENT: Normal NECK: No JVD; No carotid bruits LYMPHATICS: No lymphadenopathy CARDIAC: S1S2 noted,RRR, no murmurs, rubs, gallops RESPIRATORY:  Clear to auscultation without rales, wheezing or rhonchi  ABDOMEN: Soft, non-tender, non-distended, +bowel sounds, no guarding. EXTREMITIES: No edema, No cyanosis, no clubbing MUSCULOSKELETAL:  No deformity  SKIN: Warm and dry NEUROLOGIC:  Alert and oriented x 3, non-focal PSYCHIATRIC:  Normal affect, good insight  ASSESSMENT:    1. DOE (dyspnea on exertion)   2. Enlarged pulmonary artery (HCC)   3. Pre-procedure lab exam   4. Prediabetes   5. Morbid obesity with BMI of 50.0-59.9, adult (HCC)    PLAN:      With her worsening shortness of breath and abnormal echocardiogram I would like to a cardiac catheterization.  I like to understand especially with wall motion abnormalities to make sure there are no pulmonary obstruction.  On the right side we will be able to get right-sided heart pressure noted that she also does have likely untreated sleep apnea.  The patient understands that risks include but are not limited to stroke (1 in 1000), death (1 in 1000), kidney failure [usually temporary] (1 in 500), bleeding (1 in 200), allergic reaction [possibly serious] (1 in 200), and agrees to proceed.  The patient understands the need to lose weight with diet and exercise. We  have discussed specific strategies for this.  Prediabetes managed by her primary provider.  The patient is in agreement with the above plan. The patient left the office in stable condition.  The patient will follow up in   Medication Adjustments/Labs and Tests Ordered: Current medicines are reviewed at length with the patient today.  Concerns regarding medicines are outlined above.  Orders Placed This Encounter  Procedures   Basic Metabolic Panel (BMET)   Magnesium   CBC with Differential/Platelet   EKG 12-Lead   No orders of the defined types were placed in this encounter.   Patient Instructions  Medication Instructions:  Your physician recommends that you continue on your current medications as directed. Please refer to the Current Medication list given to you today.  *If you need a refill on your cardiac medications before your next appointment, please call your pharmacy*   Lab Work: Your physician recommends that you have labs drawn today: BMET, Mag, CBC If you have labs (blood work) drawn today and your tests are completely normal, you will receive your results only by: MyChart Message (if you have MyChart) OR A paper copy in the mail If you have any lab test that is abnormal or we need to change your treatment, we will call you to review the results.   Testing/Procedures:  [place] [facility] DEPT OF [facility] AT [facility] [address] [place] [address] Dept: [phone] Loc: [phone]  Byrle Sundermeyer  09/11/2023  You are scheduled for a Cardiac Catheterization on Friday, September 27 with Dr. Nicki Guadalajara  1. [facility]  Main Entrance A) at [facility]: [address] [place], [place] [address] at 5:30 AM (This time is 2 hour(s) before your procedure to ensure your preparation). Free valet parking service is available. You will check in at ADMITTING. The support  person will be asked to wait in the waiting room.  It is OK to have someone drop you off and come back when you are ready to be discharged.    Special note: Every effort is made to have your procedure done on time. Please understand that emergencies sometimes delay scheduled procedures.  2. Diet: Do not eat solid foods after midnight.  The patient may have clear liquids until 5am upon the day of the procedure.  3. Labs: You will need to have blood drawn on TODAY.   4. Medication instructions in preparation for your procedure:   Contrast Allergy: No  On the morning of your procedure, take your Aspirin 81 mg and any morning medicines NOT listed above.  You may use sips of water.  5. Plan to go home the same day, you will only stay overnight if medically necessary. 6. Bring a current list of your medications and current insurance cards. 7. You MUST have a responsible person to drive you home. 8. Someone MUST be with you the first 24 hours after you arrive home or your discharge will be delayed. 9. Please wear clothes that are easy to get on and off and wear slip-on shoes.  Thank you for allowing [place] to care for you!   -- [facility]    Follow-Up: At [facility], you and your health needs are our priority.  As part of our continuing mission to provide you with exceptional heart care, we have created designated Provider Care Teams.  These Care Teams include your primary Cardiologist (physician) and Advanced Practice Providers (APPs -  Physician Assistants and Nurse Practitioners) who all work together to provide you with the care you need, when you need it.   Your next appointment:   2 week post cath - APP 4 month - Dr. Servando Salina   Adopting a Healthy Lifestyle.  Know what a healthy weight is for you (roughly BMI <25) and aim to maintain this   Aim for 7+ servings of fruits and vegetables daily   65-80+ fluid ounces of water or unsweet tea for  healthy kidneys   Limit to max 1 drink of alcohol per day; avoid smoking/tobacco   Limit animal fats in diet for cholesterol and heart health - choose grass fed whenever available   Avoid highly processed foods, and foods high in saturated/trans fats   Aim for low stress - take time to unwind and care for your mental health   Aim for 150 min of moderate intensity exercise weekly for heart health, and weights twice weekly for bone health   Aim for 7-9 hours of sleep daily   When it comes to diets, agreement about the perfect plan isnt easy to find, even among the experts. Experts at the [facility] [employer] developed an idea known as the Healthy Eating Plate. Just imagine a plate divided into logical, healthy portions.   The emphasis is on diet quality:   Load up on vegetables and fruits - one-half of your plate: Aim for color and variety, and remember that potatoes dont count.   Go for whole grains - one-quarter of your plate: Whole wheat, barley, wheat berries, quinoa, oats, brown rice, and foods  made with them. If you want pasta, go with whole wheat pasta.   Protein power - one-quarter of your plate: Fish, chicken, beans, and nuts are all healthy, versatile protein sources. Limit red meat.   The diet, however, does go beyond the plate, offering a few other suggestions.   Use healthy plant oils, such as olive, canola, soy, corn, sunflower and peanut. Check the labels, and avoid partially hydrogenated oil, which have unhealthy trans fats.   If youre thirsty, drink water. Coffee and tea are good in moderation, but skip sugary drinks and limit milk and dairy products to one or two daily servings.   The type of carbohydrate in the diet is more important than the amount. Some sources of carbohydrates, such as vegetables, fruits, whole grains, and beans-are healthier than others.   Finally, stay active  Signed, Thomasene Ripple, DO  09/15/2023 10:09 PM    [facility]

## 2023-09-11 NOTE — H&P (View-Only) (Signed)
Cardiology Office Note:    Date:  09/15/2023   ID:  Taylor Leon, DOB March 09, 1956, MRN [id number]  PCP:  Jeoffrey Massed, MD  Cardiologist:  Thomasene Ripple, DO  Electrophysiologist:  None   Referring MD: Jeoffrey Massed, MD   " I am ok"  History of Present Illness:    Taylor Leon is a 67 y.o. female with a hx of mild persistent asthma, emphysema, PPV, prediabetes, former smoker, osteoarthritis here today to be evaluated for worsening shortness of breath.  She was referred by her PCP as she has been experiencing these episodes of shortness of breath and echocardiogram back in June which show abnormal wall motion.  She has just been able to see cardiology. She reports that she is experiencing increasing shortness of breath on exertion.  Is concerned about this.  She does not have any specific report of chest discomfort.   Past Medical History:  Diagnosis Date   BPPV (benign paroxysmal positional vertigo) 01/30/2014   CAP (community acquired pneumonia) 10/2018   with acute hypoxic RF, empyema-->VATS   Cyst, breast 01/2012; 06/2012; 02/26/13; 03/03/14   Complicated cysts in upper outer left breast--no evidence of malignancy--f/u bilat diag mammo/left breast u/s on 03/06/15 showed resolution of left breast cysts.  Repeat screening mammogram 1 yr.   Empyema (HCC)    drained by CT surgery/VATS.  Full recovery as of 12/2018 CT surgery f/u visit.   H/O allergic rhinitis    Dr. Willa Rough   Hepatic cyst 2019   Noted on CT chest imaging.  Multiple, small, "simple"   History of pneumonia 2009 and 2010   supplemential oxygen d/c'd 12/16/18 by pulm   Mild persistent asthma, well controlled 2014   New adult onset asthma after respiratory infection (Dr. Willa Rough)   Morbid obesity (HCC)    BMI 50+.  At one point in time she was going to Bariatric clinic on [address] and got weekly HCG injections, monthly vit B12 injections, and took phentermine daily.  As of 05/2015 she was no longer doing this.    Nephrolithiasis 04/2018 first episode   [facility]. Liliane Shi ([facility]): 1.86mm right UVJ stone + 6 mm L AML.  Pt elected for trial of passage.   OSA (obstructive sleep apnea) 10/2018   Mild/mod OSA 12/2018--pt not able to afford CPAP as of 01/2019.   Osteoarthritis of both knees    No improvement with steroid injections; did synvisc x 3 yrs; bilat replacement was recommended but she declined.   Prediabetes    she's on metformin   Pulmonary nodule 06/29/2021   on lung ca screening CT->repeat 33mo.  This has been stable as of 03/2023   Recurrent low back pain    Thyroid nodule 03/2022   1 on each side, needs rpt u/s 03/2023    Past Surgical History:  Procedure Laterality Date   APPENDECTOMY  1978   CARDIAC CATHETERIZATION  2010   Normal coronaries per pt   CARDIOVASCULAR STRESS TEST  2010   abnl per pt; f/u cath clean   CESAREAN SECTION     x 3   CHOLECYSTECTOMY  1986   COLONOSCOPY  2011   lipoma and small diminutive polyp in rectum (in [place])-->[facility] to do repeat as of 04/05/21   COLONOSCOPY  06/15/2021   Normal-recall 10 yrs   COLONOSCOPY WITH PROPOFOL N/A 06/15/2021   Procedure: COLONOSCOPY WITH PROPOFOL;  Surgeon: Kathi Der, MD;  Location: [facility];  Service: Gastroenterology;  Laterality: N/A;   DEXA  06/2021   06/2021 NORMAL-> rpt 2 yrs.   EMPYEMA DRAINAGE Left 10/24/2018   Evacuation of empyema with decortication.  Procedure: EMPYEMA DRAINAGE;  Surgeon: Delight Ovens, MD;  Location: [facility] OR;  Service: Thoracic;  Laterality: Left;   LUMBAR DISC SURGERY  1993   L5/S1   TONSILLECTOMY AND ADENOIDECTOMY  1967ish   TOTAL ABDOMINAL HYSTERECTOMY  1994   Ovaries are still in.  This was done for DUB/fibroids.   TRANSTHORACIC ECHOCARDIOGRAM     05/2023 NORMAL   VIDEO ASSISTED THORACOSCOPY (VATS)/THOROCOTOMY Left 10/24/2018   Procedure: VIDEO ASSISTED THORACOSCOPY (VATS)/THOROCOTOMY with EVACUATION OF EMPYEMA AND DECORTICATION.;  Surgeon: Delight Ovens, MD;  Location: [facility] OR;   Service: Thoracic;  Laterality: Left;   VIDEO BRONCHOSCOPY N/A 10/24/2018   Procedure: VIDEO BRONCHOSCOPY;  Surgeon: Delight Ovens, MD;  Location: [facility] OR;  Service: Thoracic;  Laterality: N/A;    Current Medications: Current Meds  Medication Sig   albuterol (VENTOLIN HFA) 108 (90 Base) MCG/ACT inhaler Inhale 2 puffs into the lungs every 6 (six) hours as needed for wheezing or shortness of breath.   Ascorbic Acid (VITAMIN C) 1000 MG tablet Take 1,000 mg by mouth daily.   beclomethasone (QVAR REDIHALER) 40 MCG/ACT inhaler Inhale 2 puffs into the lungs 2 (two) times daily.   blood glucose meter kit and supplies KIT Use to check glucose 1-2 times daily   cholecalciferol (VITAMIN D) 25 MCG (1000 UNIT) tablet Take 1,000 Units by mouth daily.   COLLAGEN PO Take by mouth.   fluticasone (FLONASE) 50 MCG/ACT nasal spray Place 1 spray into both nostrils daily. (Patient taking differently: Place 1 spray into both nostrils daily as needed for allergies.)   glucose blood (ONETOUCH ULTRA) test strip USE TO CHECK BLOOD SUGAR ONCE DAILY   Lancets (ONETOUCH DELICA PLUS LANCET30G) MISC USE TO CHECK BLOOD SUGAR ONCE DAILY   magnesium gluconate (MAGONATE) 500 MG tablet Take 500 mg by mouth daily.   Multiple Vitamin (MULTIVITAMIN) tablet Take 1 tablet by mouth daily.   OVER THE COUNTER MEDICATION Take 1 tablet by mouth in the morning and at bedtime. GOLO Tablets   [DISCONTINUED] Fexofenadine HCl (ALLEGRA PO) Take by mouth daily.   [DISCONTINUED] Semaglutide,0.25 or 0.5MG /DOS, 2 MG/3ML SOPN 0.5mg  SQ q week     Allergies:   Penicillins   Social History   Socioeconomic History   Marital status: Married    Spouse name: Not on file   Number of children: 4   Years of education: Not on file   Highest education level: Bachelor's degree (e.g., BA, AB, BS)  Occupational History   Not on file  Tobacco Use   Smoking status: Former    Current packs/day: 0.00    Average packs/day: 0.7 packs/day for 34.0 years  (25.5 ttl pk-yrs)    Types: Cigarettes    Start date: 08/1976    Quit date: 12/28/2009    Years since quitting: 13.7    Passive exposure: Past   Smokeless tobacco: Never  Vaping Use   Vaping status: Never Used  Substance and Sexual Activity   Alcohol use: Yes    Comment: social   Drug use: No   Sexual activity: Not on file  Other Topics Concern   Not on file  Social History Narrative   Married, 4 grown children.   Relocated to [place], [place] from [place] about 2012.   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   Exercise: intermittently does water aerobics..            Social Determinants of Health   Financial Resource Strain: Low Risk  (06/28/2023)   Overall Financial Resource Strain (CARDIA)    Difficulty of Paying Living Expenses: Not hard at all  Food Insecurity: No Food Insecurity (06/28/2023)   Hunger Vital Sign    Worried About Running Out of Food in the Last Year: Never true    Ran Out of Food in the Last Year: Never true  Transportation Needs: No Transportation Needs (06/28/2023)   PRAPARE - Administrator, Civil Service (Medical): No    Lack of Transportation (Non-Medical): No  Physical Activity: Inactive (06/28/2023)   Exercise Vital Sign    Days of Exercise per Week: 0 days    Minutes of Exercise per Session: 0 min  Stress: No Stress Concern Present (06/28/2023)   [employer] of Occupational Health - Occupational Stress Questionnaire    Feeling of Stress : Not at all  Social Connections: Socially Integrated (06/28/2023)   Social Connection and Isolation Panel [NHANES]    Frequency of Communication with Friends and Family: More than three times a week    Frequency of Social Gatherings with Friends and Family: More than three times a week    Attends Religious Services: More than 4 times per year    Active Member of [employer] or Organizations: Yes    Attends Banker Meetings: 1  to 4 times per year    Marital Status: Married     Family History: The patient's family history includes Arthritis in her mother and paternal grandmother; Colon cancer in her mother; Diabetes in her mother; Heart disease (age of onset: 70) in her mother; Hypertension in her mother. There is no history of Breast cancer.  ROS:   Review of Systems  Constitution: Negative for decreased appetite, fever and weight gain.  HENT: Negative for congestion, ear discharge, hoarse voice and sore throat.   Eyes: Negative for discharge, redness, vision loss in right eye and visual halos.  Cardiovascular: Negative for chest pain, dyspnea on exertion, leg swelling, orthopnea and palpitations.  Respiratory: Negative for cough, hemoptysis, shortness of breath and snoring.   Endocrine: Negative for heat intolerance and polyphagia.  Hematologic/Lymphatic: Negative for bleeding problem. Does not bruise/bleed easily.  Skin: Negative for flushing, nail changes, rash and suspicious lesions.  Musculoskeletal: Negative for arthritis, joint pain, muscle cramps, myalgias, neck pain and stiffness.  Gastrointestinal: Negative for abdominal pain, bowel incontinence, diarrhea and excessive appetite.  Genitourinary: Negative for decreased libido, genital sores and incomplete emptying.  Neurological: Negative for brief paralysis, focal weakness, headaches and loss of balance.  Psychiatric/Behavioral: Negative for altered mental status, depression and suicidal ideas.  Allergic/Immunologic: Negative for HIV exposure and persistent infections.    EKGs/Labs/Other Studies Reviewed:    The following studies were reviewed today:   EKG:  The ekg ordered today demonstrates sinus bradycardia, HR 55 bpm  Recent Labs: 01/26/2023: ALT 12; TSH 1.35 09/11/2023: BUN 17; Creatinine, Ser 0.79; Hemoglobin 13.2; Magnesium 1.9; Platelets 270; Potassium 4.6; Sodium 144  Recent Lipid Panel    Component Value Date/Time   CHOL 184 08/04/2023  0827   TRIG 104.0 08/04/2023 0827   HDL 77.40 08/04/2023 0827   CHOLHDL 2 08/04/2023 0827   VLDL 20.8 08/04/2023 0827   LDLCALC 86 08/04/2023 0827    Physical Exam:    VS:  BP 116/64 (BP Location: Left Arm, Patient  Position: Sitting, Cuff Size: Large)   Pulse (!) 55   Ht 5\' 7"  (1.702 m)   Wt (!) 339 lb 3.2 oz (153.9 kg)   SpO2 96%   BMI 53.13 kg/m     Wt Readings from Last 3 Encounters:  09/14/23 (!) 338 lb 9.6 oz (153.6 kg)  09/11/23 (!) 339 lb 3.2 oz (153.9 kg)  09/04/23 (!) 335 lb 3.2 oz (152 kg)     GEN: Well nourished, well developed in no acute distress HEENT: Normal NECK: No JVD; No carotid bruits LYMPHATICS: No lymphadenopathy CARDIAC: S1S2 noted,RRR, no murmurs, rubs, gallops RESPIRATORY:  Clear to auscultation without rales, wheezing or rhonchi  ABDOMEN: Soft, non-tender, non-distended, +bowel sounds, no guarding. EXTREMITIES: No edema, No cyanosis, no clubbing MUSCULOSKELETAL:  No deformity  SKIN: Warm and dry NEUROLOGIC:  Alert and oriented x 3, non-focal PSYCHIATRIC:  Normal affect, good insight  ASSESSMENT:    1. DOE (dyspnea on exertion)   2. Enlarged pulmonary artery (HCC)   3. Pre-procedure lab exam   4. Prediabetes   5. Morbid obesity with BMI of 50.0-59.9, adult (HCC)    PLAN:      With her worsening shortness of breath and abnormal echocardiogram I would like to a cardiac catheterization.  I like to understand especially with wall motion abnormalities to make sure there are no pulmonary obstruction.  On the right side we will be able to get right-sided heart pressure noted that she also does have likely untreated sleep apnea.  The patient understands that risks include but are not limited to stroke (1 in 1000), death (1 in 1000), kidney failure [usually temporary] (1 in 500), bleeding (1 in 200), allergic reaction [possibly serious] (1 in 200), and agrees to proceed.  The patient understands the need to lose weight with diet and exercise. We  have discussed specific strategies for this.  Prediabetes managed by her primary provider.  The patient is in agreement with the above plan. The patient left the office in stable condition.  The patient will follow up in   Medication Adjustments/Labs and Tests Ordered: Current medicines are reviewed at length with the patient today.  Concerns regarding medicines are outlined above.  Orders Placed This Encounter  Procedures   Basic Metabolic Panel (BMET)   Magnesium   CBC with Differential/Platelet   EKG 12-Lead   No orders of the defined types were placed in this encounter.   Patient Instructions  Medication Instructions:  Your physician recommends that you continue on your current medications as directed. Please refer to the Current Medication list given to you today.  *If you need a refill on your cardiac medications before your next appointment, please call your pharmacy*   Lab Work: Your physician recommends that you have labs drawn today: BMET, Mag, CBC If you have labs (blood work) drawn today and your tests are completely normal, you will receive your results only by: MyChart Message (if you have MyChart) OR A paper copy in the mail If you have any lab test that is abnormal or we need to change your treatment, we will call you to review the results.   Testing/Procedures:  [place] [facility] AT [facility] [address] [place] [address] Dept: [phone] Loc: [phone]  Byrle Sundermeyer  09/11/2023  You are scheduled for a Cardiac Catheterization on Friday, September 27 with Dr. Nicki Guadalajara  1. [facility] (  Main Entrance A) at [facility]: [address] [place], [place] [address] at 5:30 AM (This time is 2 hour(s) before your procedure to ensure your preparation). Free valet parking service is available. You will check in at ADMITTING. The support  person will be asked to wait in the waiting room.  It is OK to have someone drop you off and come back when you are ready to be discharged.    Special note: Every effort is made to have your procedure done on time. Please understand that emergencies sometimes delay scheduled procedures.  2. Diet: Do not eat solid foods after midnight.  The patient may have clear liquids until 5am upon the day of the procedure.  3. Labs: You will need to have blood drawn on TODAY.   4. Medication instructions in preparation for your procedure:   Contrast Allergy: No  On the morning of your procedure, take your Aspirin 81 mg and any morning medicines NOT listed above.  You may use sips of water.  5. Plan to go home the same day, you will only stay overnight if medically necessary. 6. Bring a current list of your medications and current insurance cards. 7. You MUST have a responsible person to drive you home. 8. Someone MUST be with you the first 24 hours after you arrive home or your discharge will be delayed. 9. Please wear clothes that are easy to get on and off and wear slip-on shoes.  Thank you for allowing [place] to care for you!   -- [facility]    Follow-Up: At [facility], you and your health needs are our priority.  As part of our continuing mission to provide you with exceptional heart care, we have created designated Provider Care Teams.  These Care Teams include your primary Cardiologist (physician) and Advanced Practice Providers (APPs -  Physician Assistants and Nurse Practitioners) who all work together to provide you with the care you need, when you need it.   Your next appointment:   2 week post cath - APP 4 month - Dr. Servando Salina   Adopting a Healthy Lifestyle.  Know what a healthy weight is for you (roughly BMI <25) and aim to maintain this   Aim for 7+ servings of fruits and vegetables daily   65-80+ fluid ounces of water or unsweet tea for  healthy kidneys   Limit to max 1 drink of alcohol per day; avoid smoking/tobacco   Limit animal fats in diet for cholesterol and heart health - choose grass fed whenever available   Avoid highly processed foods, and foods high in saturated/trans fats   Aim for low stress - take time to unwind and care for your mental health   Aim for 150 min of moderate intensity exercise weekly for heart health, and weights twice weekly for bone health   Aim for 7-9 hours of sleep daily   When it comes to diets, agreement about the perfect plan isnt easy to find, even among the experts. Experts at the [facility] [employer] developed an idea known as the Healthy Eating Plate. Just imagine a plate divided into logical, healthy portions.   The emphasis is on diet quality:   Load up on vegetables and fruits - one-half of your plate: Aim for color and variety, and remember that potatoes dont count.   Go for whole grains - one-quarter of your plate: Whole wheat, barley, wheat berries, quinoa, oats, brown rice, and foods  made with them. If you want pasta, go with whole wheat pasta.   Protein power - one-quarter of your plate: Fish, chicken, beans, and nuts are all healthy, versatile protein sources. Limit red meat.   The diet, however, does go beyond the plate, offering a few other suggestions.   Use healthy plant oils, such as olive, canola, soy, Leon, sunflower and peanut. Check the labels, and avoid partially hydrogenated oil, which have unhealthy trans fats.   If youre thirsty, drink water. Coffee and tea are good in moderation, but skip sugary drinks and limit milk and dairy products to one or two daily servings.   The type of carbohydrate in the diet is more important than the amount. Some sources of carbohydrates, such as vegetables, fruits, whole grains, and beans-are healthier than others.   Finally, stay active  Signed, Thomasene Ripple, DO  09/15/2023 10:09 PM    [facility]

## 2023-09-11 NOTE — Patient Instructions (Addendum)
Medication Instructions:  Your physician recommends that you continue on your current medications as directed. Please refer to the Current Medication list given to you today.  *If you need a refill on your cardiac medications before your next appointment, please call your pharmacy*   Lab Work: Your physician recommends that you have labs drawn today: BMET, Mag, CBC If you have labs (blood work) drawn today and your tests are completely normal, you will receive your results only by: MyChart Message (if you have MyChart) OR A paper copy in the mail If you have any lab test that is abnormal or we need to change your treatment, we will call you to review the results.   Testing/Procedures:  [place] [facility] AT [facility] [address] [place] [address] [place] [address] Dept: [phone] Loc: [phone]  Taylor Leon  09/11/2023  You are scheduled for a Cardiac Catheterization on Friday, September 27 with Dr. Nicki Guadalajara  1. Please arrive at the [facility] (Main Entrance A) at [facility]: [address] [place], [place] [address] at 5:30 AM (This time is 2 hour(s) before your procedure to ensure your preparation). Free valet parking service is available. You will check in at ADMITTING. The support person will be asked to wait in the waiting room.  It is OK to have someone drop you off and come back when you are ready to be discharged.    Special note: Every effort is made to have your procedure done on time. Please understand that emergencies sometimes delay scheduled procedures.  2. Diet: Do not eat solid foods after midnight.  The patient may have clear liquids until 5am upon the day of the procedure.  3. Labs: You will need to have blood drawn on TODAY.   4. Medication instructions in preparation for your procedure:   Contrast Allergy: No  On the morning of your procedure, take your Aspirin 81 mg  and any morning medicines NOT listed above.  You may use sips of water.  5. Plan to go home the same day, you will only stay overnight if medically necessary. 6. Bring a current list of your medications and current insurance cards. 7. You MUST have a responsible person to drive you home. 8. Someone MUST be with you the first 24 hours after you arrive home or your discharge will be delayed. 9. Please wear clothes that are easy to get on and off and wear slip-on shoes.  Thank you for allowing [place] to care for you!   --  Invasive Cardiovascular services    Follow-Up: At [facility], you and your health needs are our priority.  As part of our continuing mission to provide you with exceptional heart care, we have created designated Provider Care Teams.  These Care Teams include your primary Cardiologist (physician) and Advanced Practice Providers (APPs -  Physician Assistants and Nurse Practitioners) who all work together to provide you with the care you need, when you need it.   Your next appointment:   2 week post cath - APP 4 month - Dr. Servando Salina

## 2023-09-12 LAB — CBC WITH DIFFERENTIAL/PLATELET
Basophils Absolute: 0 10*3/uL (ref 0.0–0.2)
Basos: 0 %
EOS (ABSOLUTE): 0.2 10*3/uL (ref 0.0–0.4)
Eos: 3 %
Hematocrit: 42.2 % (ref 34.0–46.6)
Hemoglobin: 13.2 g/dL (ref 11.1–15.9)
Immature Grans (Abs): 0 10*3/uL (ref 0.0–0.1)
Immature Granulocytes: 0 %
Lymphocytes Absolute: 2.5 10*3/uL (ref 0.7–3.1)
Lymphs: 35 %
MCH: 27.4 pg (ref 26.6–33.0)
MCHC: 31.3 g/dL — ABNORMAL LOW (ref 31.5–35.7)
MCV: 88 fL (ref 79–97)
Monocytes Absolute: 0.7 10*3/uL (ref 0.1–0.9)
Monocytes: 9 %
Neutrophils Absolute: 3.8 10*3/uL (ref 1.4–7.0)
Neutrophils: 53 %
Platelets: 270 10*3/uL (ref 150–450)
RBC: 4.81 x10E6/uL (ref 3.77–5.28)
RDW: 14.3 % (ref 11.7–15.4)
WBC: 7.2 10*3/uL (ref 3.4–10.8)

## 2023-09-12 LAB — BASIC METABOLIC PANEL
BUN/Creatinine Ratio: 22 (ref 12–28)
BUN: 17 mg/dL (ref 8–27)
CO2: 20 mmol/L (ref 20–29)
Calcium: 9.6 mg/dL (ref 8.7–10.3)
Chloride: 107 mmol/L — ABNORMAL HIGH (ref 96–106)
Creatinine, Ser: 0.79 mg/dL (ref 0.57–1.00)
Glucose: 81 mg/dL (ref 70–99)
Potassium: 4.6 mmol/L (ref 3.5–5.2)
Sodium: 144 mmol/L (ref 134–144)
eGFR: 82 mL/min/{1.73_m2} (ref >59)

## 2023-09-12 LAB — MAGNESIUM: Magnesium: 1.9 mg/dL (ref 1.6–2.3)

## 2023-09-14 ENCOUNTER — Ambulatory Visit (INDEPENDENT_AMBULATORY_CARE_PROVIDER_SITE_OTHER): Payer: Medicare Other | Admitting: Family Medicine

## 2023-09-14 ENCOUNTER — Encounter: Payer: Self-pay | Admitting: Family Medicine

## 2023-09-14 VITALS — BP 89/57 | HR 57 | Temp 97.4°F | Ht 67.0 in | Wt 338.6 lb

## 2023-09-14 DIAGNOSIS — Z7689 Persons encountering health services in other specified circumstances: Secondary | ICD-10-CM

## 2023-09-14 DIAGNOSIS — Z6841 Body Mass Index (BMI) 40.0 and over, adult: Secondary | ICD-10-CM

## 2023-09-14 DIAGNOSIS — R7303 Prediabetes: Secondary | ICD-10-CM | POA: Diagnosis not present

## 2023-09-14 MED ORDER — SEMAGLUTIDE (1 MG/DOSE) 4 MG/3ML ~~LOC~~ SOPN: 1.0000 mg | PEN_INJECTOR | SUBCUTANEOUS | 0 refills | Status: DC

## 2023-09-14 NOTE — Progress Notes (Signed)
OFFICE VISIT  09/14/2023  CC:  Chief Complaint  Patient presents with   Weight Management    1 month follow up for Ozempic; pt states medication is not effective at current dose, would like to discuss increase.    Patient is a 67 y.o. female who presents for 6-week follow-up weight management in the setting of prediabetes. A/P as of last visit: "#1 prediabetes. Doing well on metformin 1000 mg twice a day. POC Hba1c today is 5.7%. Checking vitamin B12 level today due to potential decreased absorption on chronic metformin therapy.   #2 eustachian tube dysfunction bilaterally. She will start Flonase 2 sprays each nostril daily.     #3 obesity, BMI is 53. She has significant knee arthritis and impaired ambulation. She asks about Mounjaro and I printed the prescription for this today to see how much her out of pocket cost would be.  I wrote out the names of others to check for insurance coverage specifics: Saxenda, Ozempic, and Trulicity. If she does get on 1 of these medications she will stop her metformin and she will follow-up in 1 month."  INTERIM HX: She started Ozempic 0.5 q. 7 days about a month ago.  Stopped metformin at that time. No side effects. She does say that she is eating less.  She has not been exercising. Fasting sugars are 100-130s range.   Past Medical History:  Diagnosis Date   BPPV (benign paroxysmal positional vertigo) 01/30/2014   CAP (community acquired pneumonia) 10/2018   with acute hypoxic RF, empyema-->VATS   Cyst, breast 01/2012; 06/2012; 02/26/13; 03/03/14   Complicated cysts in upper outer left breast--no evidence of malignancy--f/u bilat diag mammo/left breast u/s on 03/06/15 showed resolution of left breast cysts.  Repeat screening mammogram 1 yr.   Empyema (HCC)    drained by CT surgery/VATS.  Full recovery as of 12/2018 CT surgery f/u visit.   H/O allergic rhinitis    Dr. Willa Rough   Hepatic cyst 2019   Noted on CT chest imaging.  Multiple, small,  "simple"   History of pneumonia 2009 and 2010   supplemential oxygen d/c'd 12/16/18 by pulm   Mild persistent asthma, well controlled 2014   New adult onset asthma after respiratory infection (Dr. Willa Rough)   Morbid obesity (HCC)    BMI 50+.  At one point in time she was going to Bariatric clinic on [address] and got weekly HCG injections, monthly vit B12 injections, and took phentermine daily.  As of 05/2015 she was no longer doing this.   Nephrolithiasis 04/2018 first episode   [facility]. Lil[facility]): 1.36mm right UVJ stone + 6 mm L AML.  Pt elected for trial of passage.   OSA (obstructive sleep apnea) 10/2018   Mild/mod OSA 12/2018--pt not able to afford CPAP as of 01/2019.   Osteoarthritis of both knees    No improvement with steroid injections; did synvisc x 3 yrs; bilat replacement was recommended but she declined.   Prediabetes    she's on metformin   Pulmonary nodule 06/29/2021   on lung ca screening CT->repeat 72mo.  This has been stable as of 03/2023   Recurrent low back pain    Thyroid nodule 03/2022   1 on each side, needs rpt u/s 03/2023    Past Surgical History:  Procedure Laterality Date   APPENDECTOMY  1978   CARDIAC CATHETERIZATION  2010   Normal coronaries per pt   CARDIOVASCULAR STRESS TEST  2010   abnl per pt; f/u cath  clean   CESAREAN SECTION     x 3   CHOLECYSTECTOMY  1986   COLONOSCOPY  2011   lipoma and small diminutive polyp in rectum (in [place])-->[facility] to do repeat as of 04/05/21   COLONOSCOPY  06/15/2021   Normal-recall 10 yrs   COLONOSCOPY WITH PROPOFOL N/A 06/15/2021   Procedure: COLONOSCOPY WITH PROPOFOL;  Surgeon: Kathi Der, MD;  Location: [facility];  Service: Gastroenterology;  Laterality: N/A;   DEXA  06/2021   06/2021 NORMAL-> rpt 2 yrs.   EMPYEMA DRAINAGE Left 10/24/2018   Evacuation of empyema with decortication.  Procedure: EMPYEMA DRAINAGE;  Surgeon: Delight Ovens, MD;  Location: [facility] OR;  Service: Thoracic;  Laterality: Left;    LUMBAR DISC SURGERY  1993   L5/S1   TONSILLECTOMY AND ADENOIDECTOMY  1967ish   TOTAL ABDOMINAL HYSTERECTOMY  1994   Ovaries are still in.  This was done for DUB/fibroids.   TRANSTHORACIC ECHOCARDIOGRAM     05/2023 NORMAL   VIDEO ASSISTED THORACOSCOPY (VATS)/THOROCOTOMY Left 10/24/2018   Procedure: VIDEO ASSISTED THORACOSCOPY (VATS)/THOROCOTOMY with EVACUATION OF EMPYEMA AND DECORTICATION.;  Surgeon: Delight Ovens, MD;  Location: [facility] OR;  Service: Thoracic;  Laterality: Left;   VIDEO BRONCHOSCOPY N/A 10/24/2018   Procedure: VIDEO BRONCHOSCOPY;  Surgeon: Delight Ovens, MD;  Location: [facility] OR;  Service: Thoracic;  Laterality: N/A;    Outpatient Medications Prior to Visit  Medication Sig Dispense Refill   Ascorbic Acid (VITAMIN C) 1000 MG tablet Take 1,000 mg by mouth daily.     beclomethasone (QVAR REDIHALER) 40 MCG/ACT inhaler Inhale 2 puffs into the lungs 2 (two) times daily. 1 each 1   blood glucose meter kit and supplies KIT Use to check glucose 1-2 times daily 1 each 0   cholecalciferol (VITAMIN D) 25 MCG (1000 UNIT) tablet Take 1,000 Units by mouth daily.     COLLAGEN PO Take by mouth.     Fexofenadine HCl (ALLEGRA PO) Take by mouth daily.     fluticasone (FLONASE) 50 MCG/ACT nasal spray Place 1 spray into both nostrils daily. 16 g 2   glucose blood (ONETOUCH ULTRA) test strip USE TO CHECK BLOOD SUGAR ONCE DAILY 100 strip 12   Lancets (ONETOUCH DELICA PLUS LANCET30G) MISC USE TO CHECK BLOOD SUGAR ONCE DAILY 100 each 12   magnesium gluconate (MAGONATE) 500 MG tablet Take 500 mg by mouth daily.     Multiple Vitamin (MULTIVITAMIN) tablet Take 1 tablet by mouth daily.     OVER THE COUNTER MEDICATION daily. GOLO     Semaglutide,0.25 or 0.5MG /DOS, 2 MG/3ML SOPN 0.5mg  SQ q week 3 mL 0   albuterol (VENTOLIN HFA) 108 (90 Base) MCG/ACT inhaler Inhale 2 puffs into the lungs every 6 (six) hours as needed for wheezing or shortness of breath. (Patient not taking: Reported on 09/14/2023) 8 g 6    No facility-administered medications prior to visit.    Allergies  Allergen Reactions   Penicillins Nausea Only and Rash    Has patient had a PCN reaction causing immediate rash, facial/tongue/throat swelling, SOB or lightheadedness with hypotension: yES Has patient had a PCN reaction causing severe rash involving mucus membranes or skin necrosis: No Has patient had a PCN reaction that required hospitalization: No Has patient had a PCN reaction occurring within the last 10 years: No If all of the above answers are "NO", then may proceed with Cephalosporin use. CC    Review of Systems As per HPI  PE:    09/14/2023  9:45 AM 09/11/2023    1:46 PM 09/04/2023   10:07 AM  Vitals with BMI  Height 5\' 7"  5\' 7"  5\' 7"   Weight 338 lbs 10 oz 339 lbs 3 oz 335 lbs 3 oz  BMI 53.02 53.11 52.49  Systolic 89 116 132  Diastolic 57 64 68  Pulse 57 55 66     Physical Exam  Gen: Alert, well appearing.  Patient is oriented to person, place, time, and situation. AFFECT: pleasant, lucid thought and speech. No further exam today.  LABS:  None today  IMPRESSION AND PLAN:  Weight management: Obesity, BMI is 53. Comorbidities: Prediabetes, knee arthritis and impaired ambulation and ability to exercise, obstructive sleep apnea. She has not lost any weight on Ozempic at the 0.5 mg weekly dose for the last month. Will increase to 1 mg q. 7 days.  An After Visit Summary was printed and given to the patient.  FOLLOW UP: Return in about 4 weeks (around 10/12/2023) for f/u wt mgmt/prediab.  Signed:  Santiago Bumpers, MD           09/14/2023

## 2023-09-20 ENCOUNTER — Telehealth: Payer: Self-pay | Admitting: *Deleted

## 2023-09-20 NOTE — Telephone Encounter (Signed)
Cardiac Catheterization scheduled at [facility] for: Friday September 22, 2023 7:30 AM Arrival time [facility] Main Entrance A at: 5:30 AM  Nothing to eat after midnight prior to procedure, clear liquids until 5 AM day of procedure  Medication instructions: -Usual morning medications can be taken with sips of water including aspirin 81 mg.  Plan to go home the same day, you will only stay overnight if medically necessary.  You must have responsible adult to drive you home.  Someone must be with you the first 24 hours after you arrive home.  Reviewed procedure instructions with patient. Patient reports semaglutide weekly injection is on Mondays-last dose was 09/18/23.

## 2023-09-22 ENCOUNTER — Encounter (HOSPITAL_COMMUNITY)
Admission: RE | Disposition: A | Discharge status: Home / Self Care | Payer: Self-pay | Source: Ambulatory Visit | Attending: Cardiovascular Disease

## 2023-09-22 ENCOUNTER — Other Ambulatory Visit: Payer: Self-pay

## 2023-09-22 ENCOUNTER — Ambulatory Visit (HOSPITAL_COMMUNITY)
Admission: RE | Admit: 2023-09-22 | Discharge: 2023-09-22 | Disposition: A | Discharge status: Home / Self Care | Payer: Medicare Other | Source: Ambulatory Visit | Attending: Cardiovascular Disease | Admitting: Cardiovascular Disease

## 2023-09-22 DIAGNOSIS — Z6841 Body Mass Index (BMI) 40.0 and over, adult: Secondary | ICD-10-CM | POA: Diagnosis not present

## 2023-09-22 DIAGNOSIS — R0600 Dyspnea, unspecified: Secondary | ICD-10-CM | POA: Diagnosis not present

## 2023-09-22 DIAGNOSIS — Z87891 Personal history of nicotine dependence: Secondary | ICD-10-CM | POA: Diagnosis not present

## 2023-09-22 DIAGNOSIS — R0609 Other forms of dyspnea: Secondary | ICD-10-CM

## 2023-09-22 DIAGNOSIS — J45909 Unspecified asthma, uncomplicated: Secondary | ICD-10-CM | POA: Insufficient documentation

## 2023-09-22 DIAGNOSIS — R7303 Prediabetes: Secondary | ICD-10-CM | POA: Insufficient documentation

## 2023-09-22 HISTORY — PX: RIGHT/LEFT HEART CATH AND CORONARY ANGIOGRAPHY: CATH118266

## 2023-09-22 LAB — POCT I-STAT 7, (LYTES, BLD GAS, ICA,H+H)
Acid-base deficit: 2 mmol/L (ref 0.0–2.0)
Bicarbonate: 23.6 mmol/L (ref 20.0–28.0)
Calcium, Ion: 1.28 mmol/L (ref 1.15–1.40)
HCT: 35 % — ABNORMAL LOW (ref 36.0–46.0)
Hemoglobin: 11.9 g/dL — ABNORMAL LOW (ref 12.0–15.0)
O2 Saturation: 90 %
Potassium: 4.1 mmol/L (ref 3.5–5.1)
Sodium: 142 mmol/L (ref 135–145)
TCO2: 25 mmol/L (ref 22–32)
pCO2 arterial: 41.7 mm[Hg] (ref 32–48)
pH, Arterial: 7.361 (ref 7.35–7.45)
pO2, Arterial: 61 mm[Hg] — ABNORMAL LOW (ref 83–108)

## 2023-09-22 LAB — POCT I-STAT EG7
Acid-base deficit: 1 mmol/L (ref 0.0–2.0)
Acid-base deficit: 1 mmol/L (ref 0.0–2.0)
Bicarbonate: 24.6 mmol/L (ref 20.0–28.0)
Bicarbonate: 24.8 mmol/L (ref 20.0–28.0)
Calcium, Ion: 1.29 mmol/L (ref 1.15–1.40)
Calcium, Ion: 1.29 mmol/L (ref 1.15–1.40)
HCT: 35 % — ABNORMAL LOW (ref 36.0–46.0)
HCT: 35 % — ABNORMAL LOW (ref 36.0–46.0)
Hemoglobin: 11.9 g/dL — ABNORMAL LOW (ref 12.0–15.0)
Hemoglobin: 11.9 g/dL — ABNORMAL LOW (ref 12.0–15.0)
O2 Saturation: 66 %
O2 Saturation: 68 %
Potassium: 4.2 mmol/L (ref 3.5–5.1)
Potassium: 4.2 mmol/L (ref 3.5–5.1)
Sodium: 142 mmol/L (ref 135–145)
Sodium: 142 mmol/L (ref 135–145)
TCO2: 26 mmol/L (ref 22–32)
TCO2: 26 mmol/L (ref 22–32)
pCO2, Ven: 45.4 mm[Hg] (ref 44–60)
pCO2, Ven: 45.8 mm[Hg] (ref 44–60)
pH, Ven: 7.341 (ref 7.25–7.43)
pH, Ven: 7.343 (ref 7.25–7.43)
pO2, Ven: 36 mm[Hg] (ref 32–45)
pO2, Ven: 38 mm[Hg] (ref 32–45)

## 2023-09-22 LAB — GLUCOSE, CAPILLARY
Glucose-Capillary: 104 mg/dL — ABNORMAL HIGH (ref 70–99)
Glucose-Capillary: 125 mg/dL — ABNORMAL HIGH (ref 70–99)

## 2023-09-22 SURGERY — RIGHT/LEFT HEART CATH AND CORONARY ANGIOGRAPHY
Anesthesia: LOCAL

## 2023-09-22 MED ORDER — HYDRALAZINE HCL 20 MG/ML IJ SOLN: 10.0000 mg | INTRAMUSCULAR | Status: DC | PRN

## 2023-09-22 MED ORDER — FENTANYL CITRATE (PF) 100 MCG/2ML IJ SOLN
INTRAMUSCULAR | Status: DC | PRN
  Administered 2023-09-22 (×2): 25 ug via INTRAVENOUS

## 2023-09-22 MED ORDER — LIDOCAINE HCL (PF) 1 % IJ SOLN
INTRAMUSCULAR | Status: DC | PRN
  Administered 2023-09-22 (×2): 2 mL

## 2023-09-22 MED ORDER — VERAPAMIL HCL 2.5 MG/ML IV SOLN
INTRAVENOUS | Status: AC
  Filled 2023-09-22: qty 2

## 2023-09-22 MED ORDER — HEPARIN SODIUM (PORCINE) 1000 UNIT/ML IJ SOLN
INTRAMUSCULAR | Status: AC
  Filled 2023-09-22: qty 10

## 2023-09-22 MED ORDER — FENTANYL CITRATE (PF) 100 MCG/2ML IJ SOLN
INTRAMUSCULAR | Status: AC
  Filled 2023-09-22: qty 2

## 2023-09-22 MED ORDER — DIAZEPAM 2 MG PO TABS: 2.0000 mg | ORAL_TABLET | Freq: Four times a day (QID) | ORAL | Status: DC | PRN

## 2023-09-22 MED ORDER — MIDAZOLAM HCL 2 MG/2ML IJ SOLN
INTRAMUSCULAR | Status: DC | PRN
  Administered 2023-09-22 (×2): 1 mg via INTRAVENOUS

## 2023-09-22 MED ORDER — HEPARIN SODIUM (PORCINE) 1000 UNIT/ML IJ SOLN
INTRAMUSCULAR | Status: DC | PRN
  Administered 2023-09-22: 7500 [IU] via INTRAVENOUS

## 2023-09-22 MED ORDER — SODIUM CHLORIDE 0.9 % WEIGHT BASED INFUSION
3.0000 mL/kg/h | INTRAVENOUS | Status: DC
  Administered 2023-09-22: 3 mL/kg/h via INTRAVENOUS

## 2023-09-22 MED ORDER — SODIUM CHLORIDE 0.9 % IV SOLN: INTRAVENOUS | Status: DC

## 2023-09-22 MED ORDER — LABETALOL HCL 5 MG/ML IV SOLN: 10.0000 mg | INTRAVENOUS | Status: DC | PRN

## 2023-09-22 MED ORDER — SODIUM CHLORIDE 0.9% FLUSH: 3.0000 mL | INTRAVENOUS | Status: DC | PRN

## 2023-09-22 MED ORDER — SODIUM CHLORIDE 0.9 % WEIGHT BASED INFUSION: 1.0000 mL/kg/h | INTRAVENOUS | Status: DC

## 2023-09-22 MED ORDER — HEPARIN (PORCINE) IN NACL 1000-0.9 UT/500ML-% IV SOLN
INTRAVENOUS | Status: DC | PRN
  Administered 2023-09-22 (×2): 500 mL

## 2023-09-22 MED ORDER — VERAPAMIL HCL 2.5 MG/ML IV SOLN
INTRAVENOUS | Status: DC | PRN
  Administered 2023-09-22: 10 mL via INTRA_ARTERIAL

## 2023-09-22 MED ORDER — MIDAZOLAM HCL 2 MG/2ML IJ SOLN
INTRAMUSCULAR | Status: AC
  Filled 2023-09-22: qty 2

## 2023-09-22 MED ORDER — SODIUM CHLORIDE 0.9% FLUSH: 3.0000 mL | Freq: Two times a day (BID) | INTRAVENOUS | Status: DC

## 2023-09-22 MED ORDER — IOHEXOL 350 MG/ML SOLN
INTRAVENOUS | Status: DC | PRN
  Administered 2023-09-22: 45 mL

## 2023-09-22 MED ORDER — ASPIRIN 81 MG PO CHEW: 81.0000 mg | CHEWABLE_TABLET | ORAL | Status: DC

## 2023-09-22 MED ORDER — LIDOCAINE HCL (PF) 1 % IJ SOLN
INTRAMUSCULAR | Status: AC
  Filled 2023-09-22: qty 30

## 2023-09-22 MED ORDER — ONDANSETRON HCL 4 MG/2ML IJ SOLN: 4.0000 mg | Freq: Four times a day (QID) | INTRAMUSCULAR | Status: DC | PRN

## 2023-09-22 MED ORDER — ACETAMINOPHEN 325 MG PO TABS: 650.0000 mg | ORAL_TABLET | ORAL | Status: DC | PRN

## 2023-09-22 MED ORDER — SODIUM CHLORIDE 0.9 % IV SOLN: 250.0000 mL | INTRAVENOUS | Status: DC | PRN

## 2023-09-22 SURGICAL SUPPLY — 12 items
CATH BALLN WEDGE 5F 110CM (CATHETERS) IMPLANT
CATH INFINITI AMBI 5FR TG (CATHETERS) IMPLANT
DEVICE RAD COMP TR BAND LRG (VASCULAR PRODUCTS) IMPLANT
GLIDESHEATH SLEND SS 6F .021 (SHEATH) IMPLANT
GUIDEWIRE .025 260CM (WIRE) IMPLANT
GUIDEWIRE INQWIRE 1.5J.035X260 (WIRE) IMPLANT
INQWIRE 1.5J .035X260CM (WIRE) ×2
KIT SINGLE USE MANIFOLD (KITS) IMPLANT
KIT SYRINGE INJ CVI SPIKEX1 (MISCELLANEOUS) IMPLANT
PACK CARDIAC CATHETERIZATION (CUSTOM PROCEDURE TRAY) ×1 IMPLANT
SET ATX-X65L (MISCELLANEOUS) IMPLANT
SHEATH GLIDE SLENDER 4/5FR (SHEATH) IMPLANT

## 2023-09-22 NOTE — Interval H&P Note (Signed)
Cath Lab Visit (complete for each Cath Lab visit)  Clinical Evaluation Leading to the Procedure:   ACS: No.  Non-ACS:    Anginal Classification: CCS I  Anti-ischemic medical therapy: No Therapy  Non-Invasive Test Results: No non-invasive testing performed  Prior CABG: No previous CABG      History and Physical Interval Note:  09/22/2023 7:48 AM  Taylor Leon  has presented today for surgery, with the diagnosis of dyspnea.  The various methods of treatment have been discussed with the patient and family. After consideration of risks, benefits and other options for treatment, the patient has consented to  Procedure(s): RIGHT/LEFT HEART CATH AND CORONARY ANGIOGRAPHY (N/A) as a surgical intervention.  The patient's history has been reviewed, patient examined, no change in status, stable for surgery.  I have reviewed the patient's chart and labs.  Questions were answered to the patient's satisfaction.     Nicki Guadalajara

## 2023-09-22 NOTE — Progress Notes (Signed)
TR BAND REMOVAL  LOCATION:    right radial  DEFLATED PER PROTOCOL:    Yes.    TIME BAND OFF / DRESSING APPLIED:    1050 gauze dressing applied    SITE UPON ARRIVAL:    Level 0  SITE AFTER BAND REMOVAL:    Level 0  CIRCULATION SENSATION AND MOVEMENT:    Within Normal Limits   Yes.    COMMENTS:   no issues noted

## 2023-09-22 NOTE — Discharge Instructions (Signed)

## 2023-09-24 ENCOUNTER — Encounter: Payer: Self-pay | Admitting: Family Medicine

## 2023-09-25 ENCOUNTER — Encounter (HOSPITAL_COMMUNITY): Payer: Self-pay | Admitting: Cardiovascular Disease

## 2023-09-25 ENCOUNTER — Ambulatory Visit: Payer: Medicare Other | Admitting: Cardiovascular Disease

## 2023-09-25 NOTE — Progress Notes (Signed)
Cardiology Office Note:    Date:  10/06/2023   ID:  Taylor Leon, DOB Feb 25, 1956, MRN [id number]  PCP:  Jeoffrey Massed, MD  Cardiologist:  Thomasene Ripple, DO  Electrophysiologist:  None   Referring MD: Jeoffrey Massed, MD   Chief Complaint: follow-up of cardiac catheterization  History of Present Illness:    Taylor Leon is a 67 y.o. female with a history of normal coronaries on cardiac catheterization in 08/2023, mild mitral regurgitation, prediabetes, persistent asthma, chronic back pain, morbid obesity (BMI >50), and prior tobacco use who is followed by Dr. Servando Salina and presents today for follow-up of recent cardiac catheterization.   Patient was referred to Dr. Servando Salina on 09/11/2023 for further evaluation of worsening shortness of breath on exertion. Prior Echo in 05/2023 showed LVEF of 55-60% with normal diastolic parameters (the inferolateral wall endocardium was poorly defined so could not rule out basal to mid hypokinesis), mildly enlarged RV with mildly reduced RV function and normal PASP, and mild MR. R/LHC was ordered and showed normal coronaries with normal right heart pressures.  Patient presents today for follow-up. She is doing well since cardiac catheterization. She continues to have intermittent dyspnea on exertion but this is stable. She describes dyspnea with activities such as climbing steps, walking uphill, or long walks. However, it does not affect daily activities around the house or going to the store. She denies any trouble breathing at night as long as she uses her CPAP machine. No orthopnea or PND.  No chest pain or edema. She reports some heart fluttering when she is stressed or upset but no other palpitations. No lightheadedness, dizziness, syncope, or edema.   EKGs/Labs/Other Studies Reviewed:    The following studies were reviewed:  Echocardiogram 06/08/2023: Impressions: 1. The inferolateral wall endocardium is poorly defined. Cannot rule out  basal to mid hypokinesis.  Consider limited study with definity contrast.  Left ventricular ejection fraction, by estimation, is 55 to 60%. Left  ventricular ejection fraction by 3D  volume is 56 %. The left ventricle has normal function. The left ventricle  has no regional wall motion abnormalities. The left ventricular internal  cavity size was mildly dilated. Left ventricular diastolic parameters were  normal.   2. Right ventricular systolic function is mildly reduced. The right  ventricular size is mildly enlarged. There is normal pulmonary artery  systolic pressure. The estimated right ventricular systolic pressure is  32.4 mmHg.   3. The mitral valve is normal in structure. Mild mitral valve  regurgitation. No evidence of mitral stenosis.   4. The aortic valve is tricuspid. Aortic valve regurgitation is not  visualized. Aortic valve sclerosis/calcification is present, without any  evidence of aortic stenosis.   5. The inferior vena cava is normal in size with greater than 50%  respiratory variability, suggesting right atrial pressure of 3 mmHg.   6. Ascending aorta measurements are within normal limits for age when  indexed to body surface area.   7. The inferolateral wall endocardium is poorly defined. Cannot rule out  basal to mid hypokinesis. Consider limited study with definity contrast.  _______________  Right/ Left Cardiac Catheterization 09/22/2023:   No indication for antiplatelet therapy at this time .   Normal right heart pressures.   PA artery mean pressure 21 mmHg.   Normal epicardial coronary arteries with a right dominant system.   Recommendation: Continue optimal blood pressure, allergic asthma and prediabetes management.  With her morbid obesity, recommend significant weight loss.  Diagnostic Dominance:  Right     EKG:  EKG not ordered today.   Recent Labs: 01/26/2023: ALT 12; TSH 1.35 09/11/2023: BUN 17; Creatinine, Ser 0.79; Magnesium 1.9; Platelets 270 09/22/2023: Hemoglobin  11.9; Potassium 4.1; Sodium 142  Recent Lipid Panel    Component Value Date/Time   CHOL 184 08/04/2023 0827   TRIG 104.0 08/04/2023 0827   HDL 77.40 08/04/2023 0827   CHOLHDL 2 08/04/2023 0827   VLDL 20.8 08/04/2023 0827   LDLCALC 86 08/04/2023 0827    Physical Exam:    Vital Signs: BP 118/60 (BP Location: Left Arm, Patient Position: Sitting, Cuff Size: Normal)   Pulse 64   Ht 5\' 7"  (1.702 m)   Wt (!) 341 lb (154.7 kg)   SpO2 94%   BMI 53.41 kg/m     Wt Readings from Last 3 Encounters:  10/06/23 (!) 341 lb (154.7 kg)  09/22/23 (!) 337 lb (152.9 kg)  09/14/23 (!) 338 lb 9.6 oz (153.6 kg)     General: 67 y.o. morbidly obese African-American female in no acute distress. Neck: Supple. No JVD. Heart:  RRR. Distinct S1 and S2. No murmurs, gallops, or rubs.  Lungs: No increased work of breathing. Clear to ausculation bilaterally. No wheezes, rhonchi, or rales.  MSK: Normal strength and tone for age.  Extremities: No lower extremity edema. Right radial cath site with mild healing ecchymosis but soft and stable with no signs of hematoma.  Skin: Warm and dry. Neuro: No focal deficits. Psych: Normal affect. Responds appropriately.   Assessment:    1. Dyspnea on exertion   2. Uncomplicated asthma, unspecified asthma severity, unspecified whether persistent   3. Mitral valve insufficiency, unspecified etiology   4. Prediabetes     Plan:    Dyspnea on Exertion Asthma Patient has had progressive dyspnea on exertion. Cardiac work-up has been unremarkable. Echo in 05/2023 showed LVEF of 55-60% with normal diastolic parameters (the inferolateral wall endocardium was poorly defined so could not rule out basal to mid hypokinesis), mildly enlarged RV with mildly reduced RV function and normal PASP, and mild MR. R/LHC in 08/2023 showed normal coronaries with normal left and right heart pressures. PA artery mean pressure 21 mmHg. - Stable - Dyspnea not cardiac in nature given recent  work-up. Suspect multifactorial given emphysema, asthma, and morbid obesity. Sleep apnea/ obesity hypoventilation syndrome. No additional cardiac work-up necessary at this time. Management per Pulmonology.  Mild Mitral Regurgitation Noted on Echo in 05/2023.  - Can continue to monitor with routine serial imaging. Recommend repeat Echo in 3-5 years (or sooner depending on symptoms).   Prediabetes  - Management per PCP.  Disposition: Patient already has follow-up with Dr. Servando Salina scheduled for 12/2023.    Medication Adjustments/Labs and Tests Ordered: Current medicines are reviewed at length with the patient today.  Concerns regarding medicines are outlined above.  No orders of the defined types were placed in this encounter.  No orders of the defined types were placed in this encounter.   Patient Instructions  Medication Instructions:  No Changes *If you need a refill on your cardiac medications before your next appointment, please call your pharmacy*   Lab Work: No Labs   Testing/Procedures: No Testing   Follow-Up: At [facility], you and your health needs are our priority.  As part of our continuing mission to provide you with exceptional heart care, we have created designated Provider Care Teams.  These Care Teams include your primary Cardiologist (physician) and Advanced Practice Providers (APPs -  Physician Assistants and Nurse Practitioners) who all work together to provide you with the care you need, when you need it.  We recommend signing up for the patient portal called "MyChart".  Sign up information is provided on this After Visit Summary.  MyChart is used to connect with patients for Virtual Visits (Telemedicine).  Patients are able to view lab/test results, encounter notes, upcoming appointments, etc.  Non-urgent messages can be sent to your provider as well.   To learn more about what you can do with MyChart, go to [email or web address].    Your next  appointment:   January 17 at 10 AM with Thomasene Ripple, DO     Signed, Corrin Parker, PA-C  10/06/2023 9:25 AM    [facility]

## 2023-09-26 DIAGNOSIS — E119 Type 2 diabetes mellitus without complications: Secondary | ICD-10-CM | POA: Diagnosis not present

## 2023-09-27 DIAGNOSIS — G4733 Obstructive sleep apnea (adult) (pediatric): Secondary | ICD-10-CM | POA: Diagnosis not present

## 2023-09-27 DIAGNOSIS — J45909 Unspecified asthma, uncomplicated: Secondary | ICD-10-CM | POA: Diagnosis not present

## 2023-10-06 ENCOUNTER — Encounter: Payer: Self-pay | Admitting: Student

## 2023-10-06 ENCOUNTER — Ambulatory Visit: Payer: Medicare Other | Attending: Student | Admitting: Student

## 2023-10-06 VITALS — BP 118/60 | HR 64 | Ht 67.0 in | Wt 341.0 lb

## 2023-10-06 DIAGNOSIS — R0609 Other forms of dyspnea: Secondary | ICD-10-CM | POA: Diagnosis not present

## 2023-10-06 DIAGNOSIS — I340 Nonrheumatic mitral (valve) insufficiency: Secondary | ICD-10-CM | POA: Diagnosis not present

## 2023-10-06 DIAGNOSIS — R7303 Prediabetes: Secondary | ICD-10-CM | POA: Diagnosis not present

## 2023-10-06 DIAGNOSIS — J45909 Unspecified asthma, uncomplicated: Secondary | ICD-10-CM

## 2023-10-06 NOTE — Patient Instructions (Signed)
Medication Instructions:  No Changes *If you need a refill on your cardiac medications before your next appointment, please call your pharmacy*   Lab Work: No Labs   Testing/Procedures: No Testing   Follow-Up: At [facility], you and your health needs are our priority.  As part of our continuing mission to provide you with exceptional heart care, we have created designated Provider Care Teams.  These Care Teams include your primary Cardiologist (physician) and Advanced Practice Providers (APPs -  Physician Assistants and Nurse Practitioners) who all work together to provide you with the care you need, when you need it.  We recommend signing up for the patient portal called "MyChart".  Sign up information is provided on this After Visit Summary.  MyChart is used to connect with patients for Virtual Visits (Telemedicine).  Patients are able to view lab/test results, encounter notes, upcoming appointments, etc.  Non-urgent messages can be sent to your provider as well.   To learn more about what you can do with MyChart, go to [email or web address].    Your next appointment:   January 17 at 10 AM with Thomasene Ripple, DO

## 2023-10-16 ENCOUNTER — Ambulatory Visit: Payer: Medicare Other | Admitting: Family Medicine

## 2023-10-16 ENCOUNTER — Encounter: Payer: Self-pay | Admitting: Family Medicine

## 2023-10-16 VITALS — BP 94/63 | HR 60 | Wt 341.6 lb

## 2023-10-16 DIAGNOSIS — Z23 Encounter for immunization: Secondary | ICD-10-CM | POA: Diagnosis not present

## 2023-10-16 DIAGNOSIS — Z7689 Persons encountering health services in other specified circumstances: Secondary | ICD-10-CM | POA: Diagnosis not present

## 2023-10-16 DIAGNOSIS — R7303 Prediabetes: Secondary | ICD-10-CM | POA: Diagnosis not present

## 2023-10-16 MED ORDER — SEMAGLUTIDE (2 MG/DOSE) 8 MG/3ML ~~LOC~~ SOPN: 2.0000 mg | PEN_INJECTOR | SUBCUTANEOUS | 0 refills | Status: DC

## 2023-10-16 NOTE — Progress Notes (Signed)
OFFICE VISIT  10/16/2023  CC:  Chief Complaint  Patient presents with   Weight Check    F/U weight management/obesity.  Pt states that the current dosage of Ozempic she is on is not working. Pt states she has been on it for 2 months and has seen no change.     Patient is a 67 y.o. female who presents for 1 month follow-up weight management/obesity. A/P as of last visit: "Weight management: Obesity, BMI is 53. Comorbidities: Prediabetes, knee arthritis and impaired ambulation and ability to exercise, obstructive sleep apnea. She has not lost any weight on Ozempic at the 0.5 mg weekly dose for the last month. Will increase to 1 mg q. 7 days."  INTERIM HX: Joelene is feeling well. Appetite is not much affected by the Ozempic.  No adverse side effects with the 1 mg weekly dose. She finds it difficult to burn calories due to her chronic knee pain. Home glucoses ranging 110s to 120s.  Past Medical History:  Diagnosis Date   BPPV (benign paroxysmal positional vertigo) 01/30/2014   CAP (community acquired pneumonia) 10/2018   with acute hypoxic RF, empyema-->VATS   Cyst, breast 01/2012; 06/2012; 02/26/13; 03/03/14   Complicated cysts in upper outer left breast--no evidence of malignancy--f/u bilat diag mammo/left breast u/s on 03/06/15 showed resolution of left breast cysts.  Repeat screening mammogram 1 yr.   Empyema (HCC)    drained by CT surgery/VATS.  Full recovery as of 12/2018 CT surgery f/u visit.   H/O allergic rhinitis    Dr. Willa Rough   Hepatic cyst 2019   Noted on CT chest imaging.  Multiple, small, "simple"   History of pneumonia 2009 and 2010   supplemential oxygen d/c'd 12/16/18 by pulm   Mild persistent asthma, well controlled 2014   New adult onset asthma after respiratory infection (Dr. Willa Rough)   Morbid obesity (HCC)    BMI 50+.  At one point in time she was going to Bariatric clinic on [address] and got weekly HCG injections, monthly vit B12 injections, and took phentermine  daily.  As of 05/2015 she was no longer doing this.   Nephrolithiasis 04/2018 first episode   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  Pt elected for trial of passage.   OSA (obstructive sleep apnea) 10/2018   Mild/mod OSA 12/2018--pt not able to afford CPAP as of 01/2019.   Osteoarthritis of both knees    No improvement with steroid injections; did synvisc x 3 yrs; bilat replacement was recommended but she declined.   Prediabetes    she's on metformin   Pulmonary nodule 06/29/2021   on lung ca screening CT->repeat 55mo.  This has been stable as of 03/2023   Recurrent low back pain    Thyroid nodule 03/2022   1 on each side, needs rpt u/s 03/2023    Past Surgical History:  Procedure Laterality Date   APPENDECTOMY  1978   CARDIAC CATHETERIZATION  2010   Normal coronaries per pt.  08/2023 normal PA pressure and normal coronaries   CARDIOVASCULAR STRESS TEST  2010   abnl per pt; f/u cath clean   CESAREAN SECTION     x 3   CHOLECYSTECTOMY  1986   COLONOSCOPY  2011   lipoma and small diminutive polyp in rectum (in [place])-->[facility] to do repeat as of 04/05/21   COLONOSCOPY  06/15/2021   Normal-recall 10 yrs   COLONOSCOPY WITH PROPOFOL N/A 06/15/2021   Procedure:  COLONOSCOPY WITH PROPOFOL;  Surgeon: Kathi Der, MD;  Location: [facility];  Service: Gastroenterology;  Laterality: N/A;   DEXA  06/2021   06/2021 NORMAL-> rpt 2 yrs.   EMPYEMA DRAINAGE Left 10/24/2018   Evacuation of empyema with decortication.  Procedure: EMPYEMA DRAINAGE;  Surgeon: Delight Ovens, MD;  Location: [facility] OR;  Service: Thoracic;  Laterality: Left;   LUMBAR DISC SURGERY  1993   L5/S1   RIGHT/LEFT HEART CATH AND CORONARY ANGIOGRAPHY N/A 09/22/2023   Procedure: RIGHT/LEFT HEART CATH AND CORONARY ANGIOGRAPHY;  Surgeon: Lennette Bihari, MD;  Location: [facility];  Service: Cardiovascular;  Laterality: N/A;   TONSILLECTOMY AND ADENOIDECTOMY  1967ish   TOTAL ABDOMINAL HYSTERECTOMY  1994    Ovaries are still in.  This was done for DUB/fibroids.   TRANSTHORACIC ECHOCARDIOGRAM     05/2023 NORMAL   VIDEO ASSISTED THORACOSCOPY (VATS)/THOROCOTOMY Left 10/24/2018   Procedure: VIDEO ASSISTED THORACOSCOPY (VATS)/THOROCOTOMY with EVACUATION OF EMPYEMA AND DECORTICATION.;  Surgeon: Delight Ovens, MD;  Location: [facility] OR;  Service: Thoracic;  Laterality: Left;   VIDEO BRONCHOSCOPY N/A 10/24/2018   Procedure: VIDEO BRONCHOSCOPY;  Surgeon: Delight Ovens, MD;  Location: [facility] OR;  Service: Thoracic;  Laterality: N/A;    Outpatient Medications Prior to Visit  Medication Sig Dispense Refill   albuterol (VENTOLIN HFA) 108 (90 Base) MCG/ACT inhaler Inhale 2 puffs into the lungs every 6 (six) hours as needed for wheezing or shortness of breath. 8 g 6   Ascorbic Acid (VITAMIN C) 1000 MG tablet Take 1,000 mg by mouth daily.     beclomethasone (QVAR REDIHALER) 40 MCG/ACT inhaler Inhale 2 puffs into the lungs 2 (two) times daily. 1 each 1   blood glucose meter kit and supplies KIT Use to check glucose 1-2 times daily 1 each 0   cholecalciferol (VITAMIN D) 25 MCG (1000 UNIT) tablet Take 1,000 Units by mouth daily.     COLLAGEN PO Take by mouth.     fexofenadine (ALLEGRA) 180 MG tablet Take 180 mg by mouth daily.     fluticasone (FLONASE) 50 MCG/ACT nasal spray Place 1 spray into both nostrils daily. (Patient taking differently: Place 1 spray into both nostrils daily as needed for allergies.) 16 g 2   glucose blood (ONETOUCH ULTRA) test strip USE TO CHECK BLOOD SUGAR ONCE DAILY 100 strip 12   Lancets (ONETOUCH DELICA PLUS LANCET30G) MISC USE TO CHECK BLOOD SUGAR ONCE DAILY 100 each 12   magnesium gluconate (MAGONATE) 500 MG tablet Take 500 mg by mouth daily.     Multiple Vitamin (MULTIVITAMIN) tablet Take 1 tablet by mouth daily.     OVER THE COUNTER MEDICATION Take 1 tablet by mouth in the morning and at bedtime. GOLO Tablets     Semaglutide, 1 MG/DOSE, 4 MG/3ML SOPN Inject 1 mg into the skin once a  week. 3 mL 0   No facility-administered medications prior to visit.    Allergies  Allergen Reactions   Penicillins Nausea Only and Rash    Has patient had a PCN reaction causing immediate rash, facial/tongue/throat swelling, SOB or lightheadedness with hypotension: yES Has patient had a PCN reaction causing severe rash involving mucus membranes or skin necrosis: No Has patient had a PCN reaction that required hospitalization: No Has patient had a PCN reaction occurring within the last 10 years: No If all of the above answers are "NO", then may proceed with Cephalosporin use. CC    Review of Systems As per HPI  PE:    10/16/2023    9:06 AM 10/06/2023    8:09 AM 09/22/2023   10:24 AM  Vitals with BMI  Height  5\' 7"    Weight 341 lbs 10 oz 341 lbs   BMI 53.49 53.4   Systolic 94 118 119  Diastolic 63 60 68  Pulse 60 64 60     Physical Exam  Gen: Alert, well appearing.  Patient is oriented to person, place, time, and situation. AFFECT: pleasant, lucid thought and speech.   LABS:  Last metabolic panel Lab Results  Component Value Date   GLUCOSE 81 09/11/2023   NA 142 09/22/2023   K 4.1 09/22/2023   CL 107 (H) 09/11/2023   CO2 20 09/11/2023   BUN 17 09/11/2023   CREATININE 0.79 09/11/2023   EGFR 82 09/11/2023   CALCIUM 9.6 09/11/2023   PHOS 2.8 10/26/2018   PROT 6.5 01/26/2023   ALBUMIN 4.0 01/26/2023   BILITOT 0.4 01/26/2023   ALKPHOS 66 01/26/2023   AST 14 01/26/2023   ALT 12 01/26/2023   ANIONGAP 7 10/30/2018   Last hemoglobin A1c Lab Results  Component Value Date   HGBA1C 5.7 (A) 08/04/2023   HGBA1C 5.7 08/04/2023   HGBA1C 5.7 08/04/2023   HGBA1C 5.7 08/04/2023   IMPRESSION AND PLAN:  Weight management: Obesity, BMI is 53. Comorbidities: Prediabetes, knee arthritis and impaired ambulation and ability to exercise, obstructive sleep apnea. She has not lost any weight on Ozempic at the 1 mg weekly dose for the last month. Will increase to 2 mg q. 7  days Will repeat A1c in 1 mo.  An After Visit Summary was printed and given to the patient.  FOLLOW UP: Return in about 4 weeks (around 11/13/2023) for f/u wt/mgmt, prediab.  Signed:  Santiago Bumpers, MD           10/16/2023

## 2023-10-24 DIAGNOSIS — M170 Bilateral primary osteoarthritis of knee: Secondary | ICD-10-CM | POA: Diagnosis not present

## 2023-10-27 DIAGNOSIS — E119 Type 2 diabetes mellitus without complications: Secondary | ICD-10-CM | POA: Diagnosis not present

## 2023-10-28 DIAGNOSIS — J45909 Unspecified asthma, uncomplicated: Secondary | ICD-10-CM | POA: Diagnosis not present

## 2023-10-28 DIAGNOSIS — G4733 Obstructive sleep apnea (adult) (pediatric): Secondary | ICD-10-CM | POA: Diagnosis not present

## 2023-10-31 DIAGNOSIS — M170 Bilateral primary osteoarthritis of knee: Secondary | ICD-10-CM | POA: Diagnosis not present

## 2023-11-08 DIAGNOSIS — M170 Bilateral primary osteoarthritis of knee: Secondary | ICD-10-CM | POA: Diagnosis not present

## 2023-11-12 ENCOUNTER — Other Ambulatory Visit: Payer: Self-pay | Admitting: Family Medicine

## 2023-11-12 NOTE — Progress Notes (Unsigned)
OFFICE VISIT  11/13/2023  CC:  Chief Complaint  Patient presents with   Weight Management Screening    Patient is a 67 y.o. female who presents for 1 mo f/u wt mgmt and prediabetes. A/P as of last visit: "Weight management: Obesity, BMI is 53. Comorbidities: Prediabetes, knee arthritis and impaired ambulation and ability to exercise, obstructive sleep apnea. She has not lost any weight on Ozempic at the 1 mg weekly dose for the last month. Will increase to 2 mg q. 7 days Will repeat A1c in 1 mo."  INTERIM HX: Feeling well, no acute concerns. Home sugars usually down less than 100 but occasionally up into the 130s fasting.  No side effects from Ozempic 2 mg a day.   Past Medical History:  Diagnosis Date   BPPV (benign paroxysmal positional vertigo) 01/30/2014   CAP (community acquired pneumonia) 10/2018   with acute hypoxic RF, empyema-->VATS   Cyst, breast 01/2012; 06/2012; 02/26/13; 03/03/14   Complicated cysts in upper outer left breast--no evidence of malignancy--f/u bilat diag mammo/left breast u/s on 03/06/15 showed resolution of left breast cysts.  Repeat screening mammogram 1 yr.   Empyema (HCC)    drained by CT surgery/VATS.  Full recovery as of 12/2018 CT surgery f/u visit.   H/O allergic rhinitis    Dr. Willa Rough   Hepatic cyst 2019   Noted on CT chest imaging.  Multiple, small, "simple"   History of pneumonia 2009 and 2010   supplemential oxygen d/c'd 12/16/18 by pulm   Mild persistent asthma, well controlled 2014   New adult onset asthma after respiratory infection (Dr. Willa Rough)   Morbid obesity (HCC)    BMI 50+.  At one point in time she was going to Bariatric clinic on [address] and got weekly HCG injections, monthly vit B12 injections, and took phentermine daily.  As of 05/2015 she was no longer doing this.   Nephrolithiasis 04/2018 first episode   [facility] Lil[facility] ([facility]): 1.73mm right UVJ stone + 6 mm L AML.  Pt elected for trial of passage.   OSA (obstructive sleep  apnea) 10/2018   Mild/mod OSA 12/2018--pt not able to afford CPAP as of 01/2019.   Osteoarthritis of both knees    No improvement with steroid injections; did synvisc x 3 yrs; bilat replacement was recommended but she declined.   Prediabetes    she's on metformin   Pulmonary nodule 06/29/2021   on lung ca screening CT->repeat 57mo.  This has been stable as of 03/2023   Recurrent low back pain    Thyroid nodule 03/2022   1 on each side, needs rpt u/s 03/2023    Past Surgical History:  Procedure Laterality Date   APPENDECTOMY  1978   CARDIAC CATHETERIZATION  2010   Normal coronaries per pt.  08/2023 normal PA pressure and normal coronaries   CARDIOVASCULAR STRESS TEST  2010   abnl per pt; f/u cath clean   CESAREAN SECTION     x 3   CHOLECYSTECTOMY  1986   COLONOSCOPY  2011   lipoma and small diminutive polyp in rectum (in [place])-->[facility] to do repeat as of 04/05/21   COLONOSCOPY  06/15/2021   Normal-recall 10 yrs   COLONOSCOPY WITH PROPOFOL N/A 06/15/2021   Procedure: COLONOSCOPY WITH PROPOFOL;  Surgeon: Kathi Der, MD;  Location: [facility];  Service: Gastroenterology;  Laterality: N/A;   DEXA  06/2021   06/2021 NORMAL-> rpt 2 yrs.   EMPYEMA DRAINAGE Left 10/24/2018   Evacuation of  empyema with decortication.  Procedure: EMPYEMA DRAINAGE;  Surgeon: Delight Ovens, MD;  Location: [facility] OR;  Service: Thoracic;  Laterality: Left;   LUMBAR DISC SURGERY  1993   L5/S1   RIGHT/LEFT HEART CATH AND CORONARY ANGIOGRAPHY N/A 09/22/2023   Procedure: RIGHT/LEFT HEART CATH AND CORONARY ANGIOGRAPHY;  Surgeon: Lennette Bihari, MD;  Location: [facility];  Service: Cardiovascular;  Laterality: N/A;   TONSILLECTOMY AND ADENOIDECTOMY  1967ish   TOTAL ABDOMINAL HYSTERECTOMY  1994   Ovaries are still in.  This was done for DUB/fibroids.   TRANSTHORACIC ECHOCARDIOGRAM     05/2023 NORMAL   VIDEO ASSISTED THORACOSCOPY (VATS)/THOROCOTOMY Left 10/24/2018   Procedure: VIDEO ASSISTED THORACOSCOPY  (VATS)/THOROCOTOMY with EVACUATION OF EMPYEMA AND DECORTICATION.;  Surgeon: Delight Ovens, MD;  Location: [facility] OR;  Service: Thoracic;  Laterality: Left;   VIDEO BRONCHOSCOPY N/A 10/24/2018   Procedure: VIDEO BRONCHOSCOPY;  Surgeon: Delight Ovens, MD;  Location: [facility] OR;  Service: Thoracic;  Laterality: N/A;    Outpatient Medications Prior to Visit  Medication Sig Dispense Refill   albuterol (VENTOLIN HFA) 108 (90 Base) MCG/ACT inhaler Inhale 2 puffs into the lungs every 6 (six) hours as needed for wheezing or shortness of breath. 8 g 6   Ascorbic Acid (VITAMIN C) 1000 MG tablet Take 1,000 mg by mouth daily.     beclomethasone (QVAR REDIHALER) 40 MCG/ACT inhaler Inhale 2 puffs into the lungs 2 (two) times daily. 1 each 1   blood glucose meter kit and supplies KIT Use to check glucose 1-2 times daily 1 each 0   cholecalciferol (VITAMIN D) 25 MCG (1000 UNIT) tablet Take 1,000 Units by mouth daily.     COLLAGEN PO Take by mouth.     fexofenadine (ALLEGRA) 180 MG tablet Take 180 mg by mouth daily.     fluticasone (FLONASE) 50 MCG/ACT nasal spray Place 1 spray into both nostrils daily. (Patient taking differently: Place 1 spray into both nostrils daily as needed for allergies.) 16 g 2   glucose blood (ONETOUCH ULTRA) test strip USE TO CHECK BLOOD SUGAR ONCE DAILY 100 strip 12   Lancets (ONETOUCH DELICA PLUS LANCET30G) MISC USE TO CHECK BLOOD SUGAR ONCE DAILY 100 each 12   magnesium gluconate (MAGONATE) 500 MG tablet Take 500 mg by mouth daily.     Multiple Vitamin (MULTIVITAMIN) tablet Take 1 tablet by mouth daily.     OVER THE COUNTER MEDICATION Take 1 tablet by mouth in the morning and at bedtime. GOLO Tablets     Semaglutide, 2 MG/DOSE, 8 MG/3ML SOPN Inject 2 mg as directed once a week. 3 mL 0   No facility-administered medications prior to visit.    Allergies  Allergen Reactions   Penicillins Nausea Only and Rash    Has patient had a PCN reaction causing immediate rash,  facial/tongue/throat swelling, SOB or lightheadedness with hypotension: yES Has patient had a PCN reaction causing severe rash involving mucus membranes or skin necrosis: No Has patient had a PCN reaction that required hospitalization: No Has patient had a PCN reaction occurring within the last 10 years: No If all of the above answers are "NO", then may proceed with Cephalosporin use. CC    Review of Systems As per HPI  PE:    11/13/2023    8:42 AM 10/16/2023    9:06 AM 10/06/2023    8:09 AM  Vitals with BMI  Height   5\' 7"   Weight 338 lbs 10 oz 341 lbs 10  oz 341 lbs  BMI  53.49 53.4  Systolic 97 94 118  Diastolic 60 63 60  Pulse 84 60 64     Physical Exam  Gen: Alert, well appearing.  Patient is oriented to person, place, time, and situation. No further exam today  LABS:  Last CBC Lab Results  Component Value Date   WBC 7.2 09/11/2023   HGB 11.9 (L) 09/22/2023   HCT 35.0 (L) 09/22/2023   MCV 88 09/11/2023   MCH 27.4 09/11/2023   RDW 14.3 09/11/2023   PLT 270 09/11/2023   Last metabolic panel Lab Results  Component Value Date   GLUCOSE 81 09/11/2023   NA 142 09/22/2023   K 4.1 09/22/2023   CL 107 (H) 09/11/2023   CO2 20 09/11/2023   BUN 17 09/11/2023   CREATININE 0.79 09/11/2023   EGFR 82 09/11/2023   CALCIUM 9.6 09/11/2023   PHOS 2.8 10/26/2018   PROT 6.5 01/26/2023   ALBUMIN 4.0 01/26/2023   BILITOT 0.4 01/26/2023   ALKPHOS 66 01/26/2023   AST 14 01/26/2023   ALT 12 01/26/2023   ANIONGAP 7 10/30/2018   Last lipids Lab Results  Component Value Date   CHOL 184 08/04/2023   HDL 77.40 08/04/2023   LDLCALC 86 08/04/2023   TRIG 104.0 08/04/2023   CHOLHDL 2 08/04/2023   Last hemoglobin A1c Lab Results  Component Value Date   HGBA1C 5.8 (A) 11/13/2023   HGBA1C 5.8 11/13/2023   HGBA1C 5.8 11/13/2023   HGBA1C 5.8 11/13/2023   Last thyroid functions Lab Results  Component Value Date   TSH 1.35 01/26/2023   Last vitamin B12 and Folate Lab  Results  Component Value Date   VITAMINB12 283 08/04/2023   IMPRESSION AND PLAN:  Weight management: Obesity, BMI is 53. Comorbidities: Prediabetes, knee arthritis and impaired ambulation and ability to exercise, obstructive sleep apnea. POC Hba1c today is 5.8%. She has lost 3 pounds in the last month on the 2 mg subcu q. 7-day dose. Continue this dosing.  She will be significantly increasing efforts at improving diet and exercise now.  An After Visit Summary was printed and given to the patient.  FOLLOW UP: Return in about 3 months (around 02/13/2024) for annual CPE (fasting).  Signed:  Santiago Bumpers, MD           11/13/2023

## 2023-11-13 ENCOUNTER — Encounter: Payer: Self-pay | Admitting: Family Medicine

## 2023-11-13 ENCOUNTER — Ambulatory Visit (INDEPENDENT_AMBULATORY_CARE_PROVIDER_SITE_OTHER): Payer: Medicare Other | Admitting: Family Medicine

## 2023-11-13 VITALS — BP 97/60 | HR 84 | Wt 338.6 lb

## 2023-11-13 DIAGNOSIS — E66813 Obesity, class 3: Secondary | ICD-10-CM

## 2023-11-13 DIAGNOSIS — Z6841 Body Mass Index (BMI) 40.0 and over, adult: Secondary | ICD-10-CM

## 2023-11-13 DIAGNOSIS — R7303 Prediabetes: Secondary | ICD-10-CM

## 2023-11-13 DIAGNOSIS — Z7689 Persons encountering health services in other specified circumstances: Secondary | ICD-10-CM

## 2023-11-13 LAB — POCT GLYCOSYLATED HEMOGLOBIN (HGB A1C)
HbA1c POC (<> result, manual entry): 5.8 % (ref 4.0–5.6)
HbA1c, POC (controlled diabetic range): 5.8 % (ref 0.0–7.0)
HbA1c, POC (prediabetic range): 5.8 % (ref 5.7–6.4)
Hemoglobin A1C: 5.8 % — AB (ref 4.0–5.6)

## 2023-11-13 MED ORDER — SEMAGLUTIDE (2 MG/DOSE) 8 MG/3ML ~~LOC~~ SOPN: 2.0000 mg | PEN_INJECTOR | SUBCUTANEOUS | 10 refills | Status: DC

## 2023-11-22 IMAGING — US US THYROID
1 series · 12 of 25 positions shown · non-contrast
Comparison: Chest CT 02/09/2022

CLINICAL DATA: Thyroid nodule incidentally noted on CT chest January 2022

EXAM:
THYROID ULTRASOUND
TECHNIQUE: Ultrasound examination of the thyroid gland and adjacent soft
tissues was performed.

[Series 1: us thyroid · 12 of 59 slices shown]
[im 3/59]
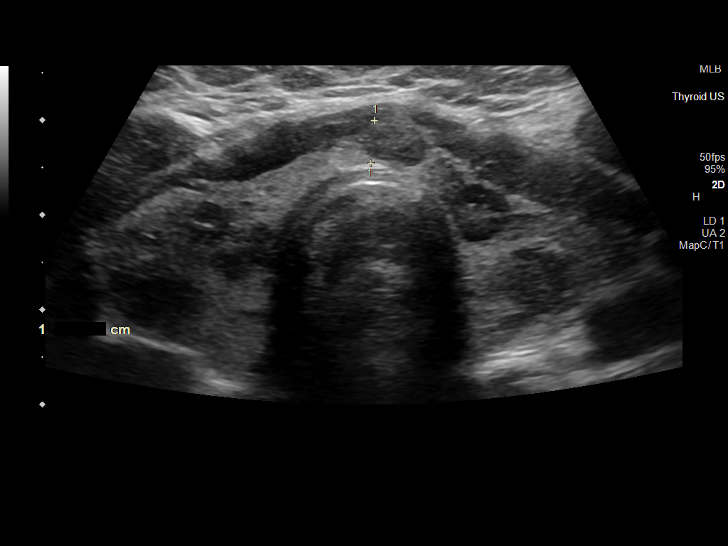
[im 8/59]
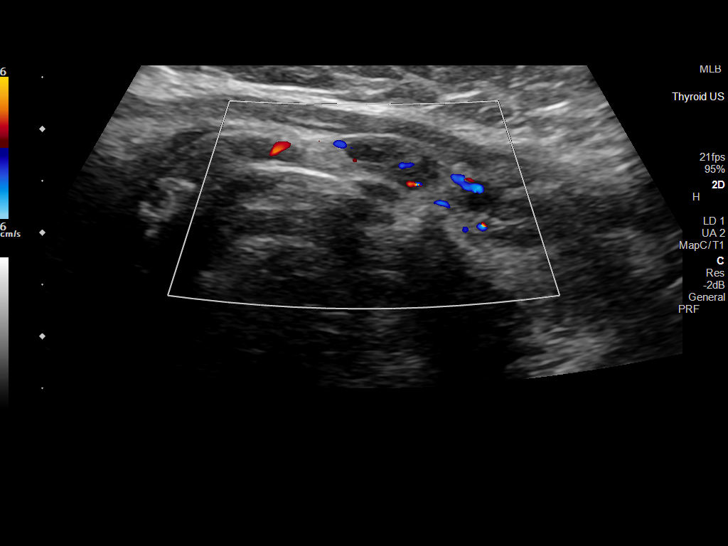
[im 13/59]
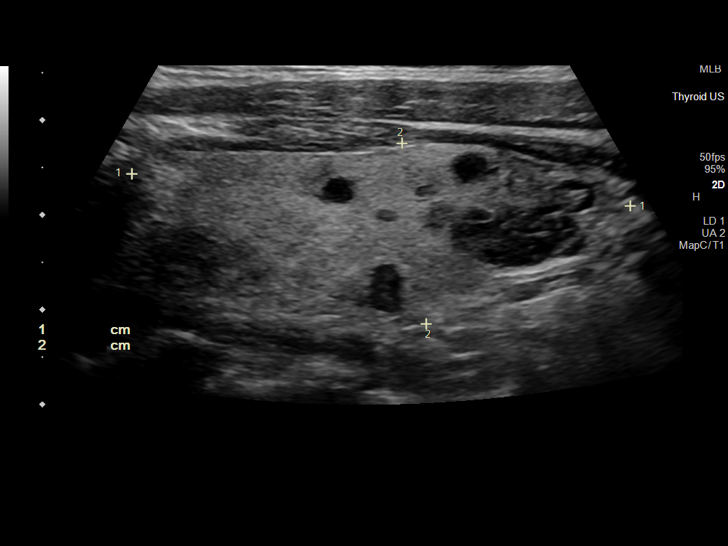
[im 17/59]
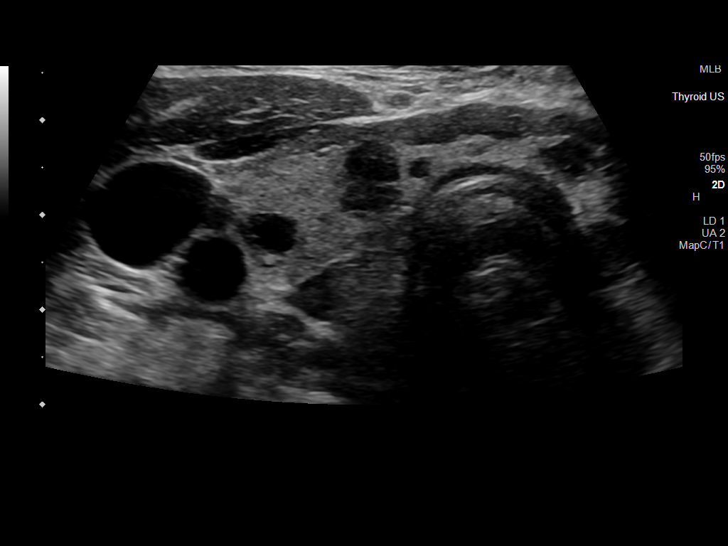
[im 22/59]
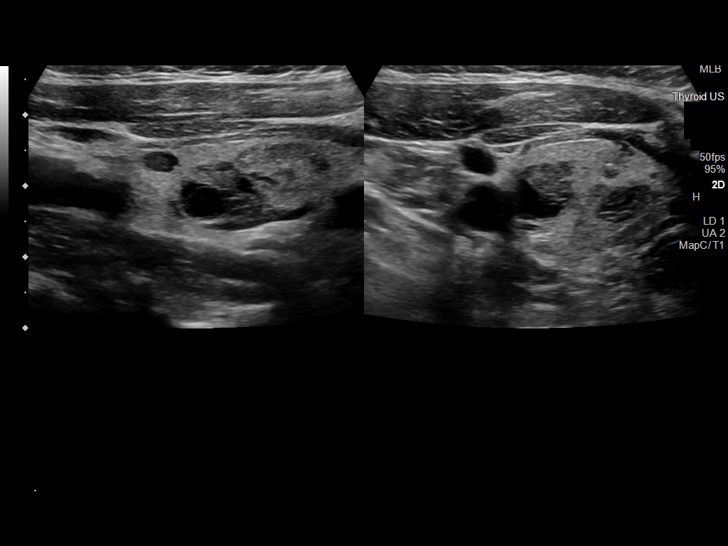
[im 27/59]
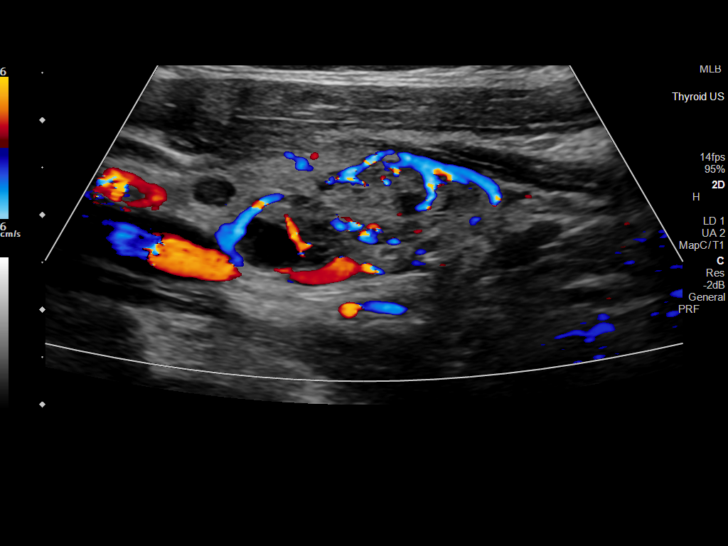
[im 32/59]
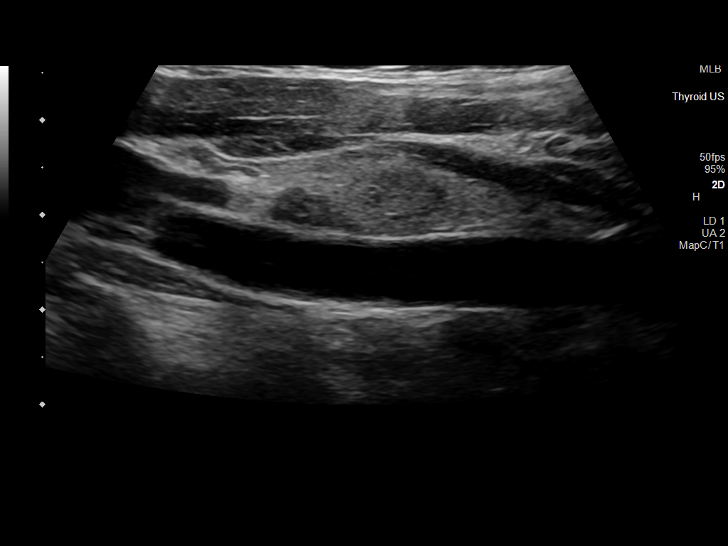
[im 37/59]
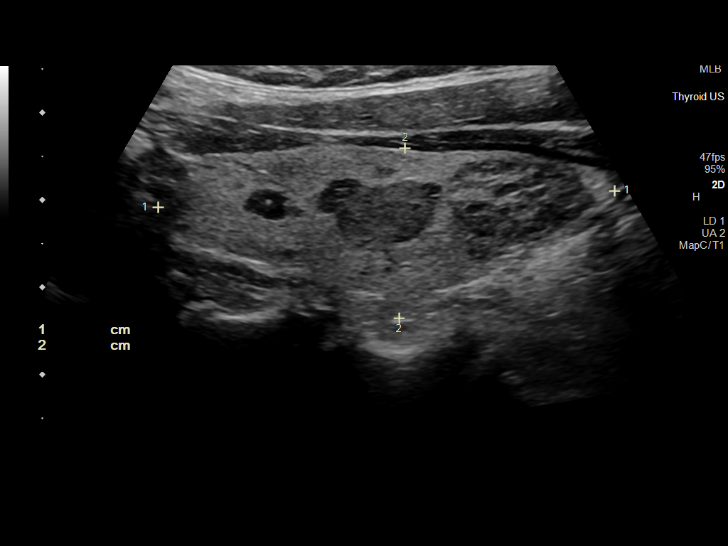
[im 42/59]
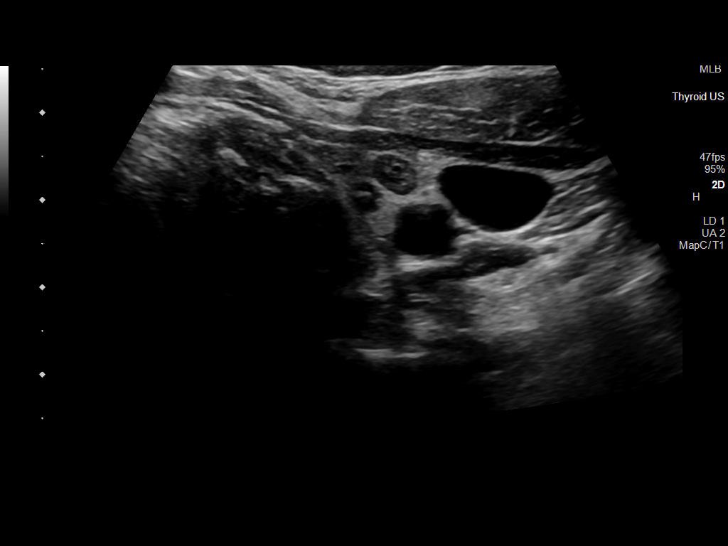
[im 46/59]
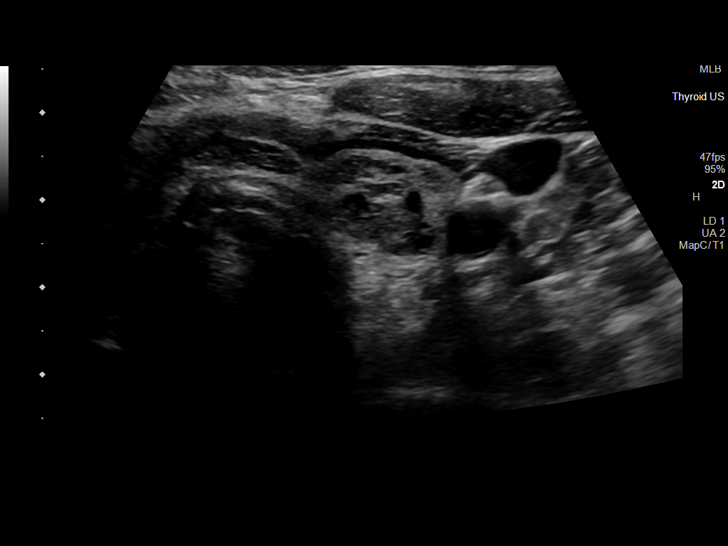
[im 51/59]
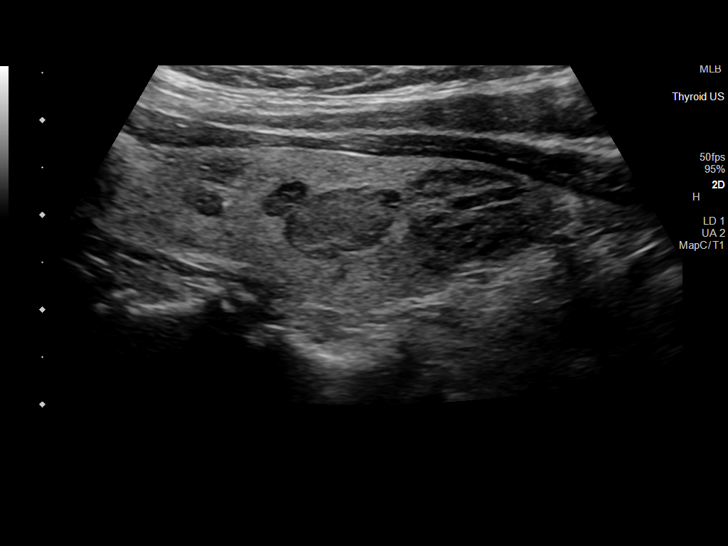
[im 56/59]
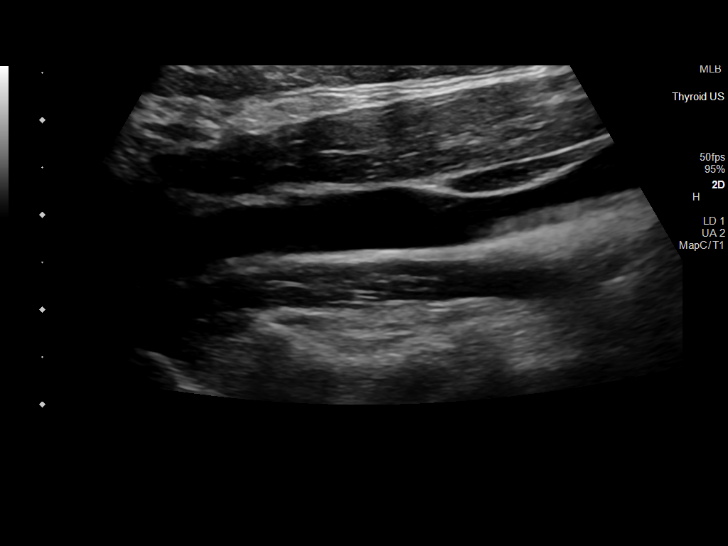

[12 of 25 positions shown; findings below may reference images not displayed]

FINDINGS: Parenchymal Echotexture: Mildly heterogenous

Isthmus: 0.5 cm

Right lobe: 5.3 x 1.9 x 2.1 cm

Left lobe: 5.3 x 2.0 x 1.7 cm

_________________________________________________________

Estimated total number of nodules >/= 1 cm: 5

Number of spongiform nodules >/=  2 cm not described below (TR1): 0

Number of mixed cystic and solid nodules >/= 1.5 cm not described
below (TR2): 0

_________________________________________________________

Nodule # 1:

Location: Isthmus; superior

Maximum size: 0.9 cm; Other 2 dimensions: 0.7 x 0.4 cm

Composition: solid/almost completely solid (2)

Echogenicity: hypoechoic (2)

Shape: not taller-than-wide (0)

Margins: ill-defined (0)

Echogenic foci: none (0)

ACR TI-RADS total points: 4.

ACR TI-RADS risk category: TR4 (4-6 points).

ACR TI-RADS recommendations:

Given size (<0.9 cm) and appearance, this nodule does NOT meet
TI-RADS criteria for biopsy or dedicated follow-up.

_________________________________________________________

Nodule # 2:

Location: Right; mid

Maximum size: 1.4 cm; Other 2 dimensions: 0.9 x 0.9 cm

Composition: mixed cystic and solid (1)

Echogenicity: isoechoic (1)

Shape: not taller-than-wide (0)

Margins: smooth (0)

Echogenic foci: none (0)

ACR TI-RADS total points: 2.

ACR TI-RADS risk category: TR2 (2 points).

ACR TI-RADS recommendations:

This nodule does NOT meet TI-RADS criteria for biopsy or dedicated
follow-up.

_________________________________________________________

Nodule # 3:

Location: Right; inferior

Maximum size: 1.7 cm; Other 2 dimensions: 1.5 x 1.0 cm

Composition: solid/almost completely solid (2)

Echogenicity: isoechoic (1)

Shape: not taller-than-wide (0)

Margins: ill-defined (0)

Echogenic foci: none (0)

ACR TI-RADS total points: 3.

ACR TI-RADS risk category: TR3 (3 points).

ACR TI-RADS recommendations:

*Given size (>/= 1.5 - 2.4 cm) and appearance, a follow-up
ultrasound in 1 year should be considered based on TI-RADS criteria.

_________________________________________________________

Nodule # 4:

Location: Right; inferior

Maximum size: 1.3 cm; Other 2 dimensions: 1.1 x 0.9 cm

Composition: spongiform (0)

ACR TI-RADS recommendations:

This nodule does NOT meet TI-RADS criteria for biopsy or dedicated
follow-up.

_________________________________________________________

Nodule # 5:

Location: Left; mid

Maximum size: 1.2 cm; Other 2 dimensions: 1.2 x 0.8 cm

Composition: solid/almost completely solid (2)

Echogenicity: hypoechoic (2)

Shape: not taller-than-wide (0)

Margins: ill-defined (0)

Echogenic foci: none (0)

ACR TI-RADS total points: 4.

ACR TI-RADS risk category: TR4 (4-6 points).

ACR TI-RADS recommendations:

*Given size (>/= 1 - 1.4 cm) and appearance, a follow-up ultrasound
in 1 year should be considered based on TI-RADS criteria.

_________________________________________________________

Nodule # 6:

Location: Left; inferior

Maximum size: 1.6 cm; Other 2 dimensions: 1.6 x 1.3 cm

Composition: spongiform (0)

ACR TI-RADS recommendations:

This nodule does NOT meet TI-RADS criteria for biopsy or dedicated
follow-up.

_________________________________________________________
IMPRESSION: 1. Nodule 3 (TI-RADS 3), measuring 1.7 cm, located in the right
inferior thyroid lobe, meets criteria for imaging follow-up. Annual
ultrasound surveillance is recommended until 5 years of stability is
documented.
2. Nodule 5 (TI-RADS 4), measuring 1.2 cm, located in the mid left
thyroid lobe, meets criteria for imaging follow-up. Annual
ultrasound surveillance is recommended until 5 years of stability is
documented.

The above is in keeping with the ACR TI-RADS recommendations - [HOSPITAL] 8242;[DATE].

## 2023-11-26 DIAGNOSIS — E119 Type 2 diabetes mellitus without complications: Secondary | ICD-10-CM | POA: Diagnosis not present

## 2023-11-27 DIAGNOSIS — J45909 Unspecified asthma, uncomplicated: Secondary | ICD-10-CM | POA: Diagnosis not present

## 2023-11-27 DIAGNOSIS — G4733 Obstructive sleep apnea (adult) (pediatric): Secondary | ICD-10-CM | POA: Diagnosis not present

## 2023-12-27 DIAGNOSIS — E119 Type 2 diabetes mellitus without complications: Secondary | ICD-10-CM | POA: Diagnosis not present

## 2024-01-11 DIAGNOSIS — K080 Exfoliation of teeth due to systemic causes: Secondary | ICD-10-CM | POA: Diagnosis not present

## 2024-01-12 ENCOUNTER — Ambulatory Visit: Payer: Medicare Other | Admitting: Cardiology

## 2024-01-16 ENCOUNTER — Other Ambulatory Visit: Payer: Self-pay | Admitting: Family Medicine

## 2024-01-16 DIAGNOSIS — Z1231 Encounter for screening mammogram for malignant neoplasm of breast: Secondary | ICD-10-CM

## 2024-01-27 DIAGNOSIS — E119 Type 2 diabetes mellitus without complications: Secondary | ICD-10-CM | POA: Diagnosis not present

## 2024-02-14 ENCOUNTER — Encounter: Payer: Medicare Other | Admitting: Family Medicine

## 2024-02-14 NOTE — Progress Notes (Unsigned)
Office Note 02/15/2024  CC:  Chief Complaint  Patient presents with   Annual Exam    Pt is fasting   Patient is a 68 y.o. female who is here for annual health maintenance exam and 3 mo f/u wt mgmt and prediabetes. A/P as of last visit: "Weight management: Obesity, BMI is 53. Comorbidities: Prediabetes, knee arthritis and impaired ambulation and ability to exercise, obstructive sleep apnea. POC Hba1c today is 5.8%. She has lost 3 pounds in the last month on the ozempic 2 mg subcu q. 7-day dose. Continue this dosing.  She will be significantly increasing efforts at improving diet and exercise now."  INTERIM HX: Taylor Leon feels good. She notes no adverse side effects from the Ozempic.  She does feel like it makes her appetite suppressed. She is a little frustrated that she has not lost any weight on this medication though.  Her chronic knee pain prevents her from exercising.  Past Medical History:  Diagnosis Date   BPPV (benign paroxysmal positional vertigo) 01/30/2014   CAP (community acquired pneumonia) 10/2018   with acute hypoxic RF, empyema-->VATS   Cyst, breast 01/2012; 06/2012; 02/26/13; 03/03/14   Complicated cysts in upper outer left breast--no evidence of malignancy--f/u bilat diag mammo/left breast u/s on 03/06/15 showed resolution of left breast cysts.  Repeat screening mammogram 1 yr.   Empyema (HCC)    drained by CT surgery/VATS.  Full recovery as of 12/2018 CT surgery f/u visit.   H/O allergic rhinitis    Dr. Willa Rough   Hepatic cyst 2019   Noted on CT chest imaging.  Multiple, small, "simple"   History of pneumonia 2009 and 2010   supplemential oxygen d/c'd 12/16/18 by pulm   Mild persistent asthma, well controlled 2014   New adult onset asthma after respiratory infection (Dr. Willa Rough)   Morbid obesity (HCC)    BMI 50+.  At one point in time she was going to Bariatric clinic on [address] and got weekly HCG injections, monthly vit B12 injections, and took phentermine  daily.  As of 05/2015 she was no longer doing this.   Nephrolithiasis 04/2018 first episode   Dr. Liliane Shi ([facility]): 1.68mm right UVJ stone + 6 mm L AML.  Pt elected for trial of passage.   OSA (obstructive sleep apnea) 10/2018   Mild/mod OSA 12/2018--pt not able to afford CPAP as of 01/2019.   Osteoarthritis of both knees    No improvement with steroid injections; did synvisc x 3 yrs; bilat replacement was recommended but she declined.   Prediabetes    she's on metformin   Pulmonary nodule 06/29/2021   on lung ca screening CT->repeat 2mo.  This has been stable as of 03/2023   Recurrent low back pain    Thyroid nodule 03/2022   1 on each side, needs rpt u/s 03/2023    Past Surgical History:  Procedure Laterality Date   APPENDECTOMY  1978   CARDIAC CATHETERIZATION  2010   Normal coronaries per pt.  08/2023 normal PA pressure and normal coronaries   CARDIOVASCULAR STRESS TEST  2010   abnl per pt; f/u cath clean   CESAREAN SECTION     x 3   CHOLECYSTECTOMY  1986   COLONOSCOPY  2011   lipoma and small diminutive polyp in rectum (in [place])-->[facility] to do repeat as of 04/05/21   COLONOSCOPY  06/15/2021   Normal-recall 10 yrs   COLONOSCOPY WITH PROPOFOL N/A 06/15/2021   Procedure: COLONOSCOPY WITH PROPOFOL;  Surgeon: Kathi Der,  MD;  Location: [facility];  Service: Gastroenterology;  Laterality: N/A;   DEXA  06/2021   06/2021 NORMAL-> rpt 2 yrs.   EMPYEMA DRAINAGE Left 10/24/2018   Evacuation of empyema with decortication.  Procedure: EMPYEMA DRAINAGE;  Surgeon: Delight Ovens, MD;  Location: [facility] OR;  Service: Thoracic;  Laterality: Left;   LUMBAR DISC SURGERY  1993   L5/S1   RIGHT/LEFT HEART CATH AND CORONARY ANGIOGRAPHY N/A 09/22/2023   Procedure: RIGHT/LEFT HEART CATH AND CORONARY ANGIOGRAPHY;  Surgeon: Lennette Bihari, MD;  [facility];  Service: Cardiovascular;  Laterality: N/A;   TONSILLECTOMY AND ADENOIDECTOMY  1967ish   TOTAL ABDOMINAL HYSTERECTOMY  1994    Ovaries are still in.  This was done for DUB/fibroids.   TRANSTHORACIC ECHOCARDIOGRAM     05/2023 NORMAL   VIDEO ASSISTED THORACOSCOPY (VATS)/THOROCOTOMY Left 10/24/2018   Procedure: VIDEO ASSISTED THORACOSCOPY (VATS)/THOROCOTOMY with EVACUATION OF EMPYEMA AND DECORTICATION.;  Surgeon: Delight Ovens, MD;  Location: [facility] OR;  Service: Thoracic;  Laterality: Left;   VIDEO BRONCHOSCOPY N/A 10/24/2018   Procedure: VIDEO BRONCHOSCOPY;  Surgeon: Delight Ovens, MD;  Location: [facility] OR;  Service: Thoracic;  Laterality: N/A;    Family History  Problem Relation Age of Onset   Arthritis Mother    Heart disease Mother 80   Hypertension Mother    Diabetes Mother    Colon cancer Mother        60's   Arthritis Paternal Grandmother    Breast cancer Neg Hx     Social History   Socioeconomic History   Marital status: Married    Spouse name: Not on file   Number of children: 4   Years of education: Not on file   Highest education level: Bachelor's degree (e.g., BA, AB, BS)  Occupational History   Not on file  Tobacco Use   Smoking status: Former    Current packs/day: 0.00    Average packs/day: 0.7 packs/day for 34.0 years (25.5 ttl pk-yrs)    Types: Cigarettes    Start date: 08/1976    Quit date: 12/28/2009    Years since quitting: 14.1    Passive exposure: Past   Smokeless tobacco: Never  Vaping Use   Vaping status: Never Used  Substance and Sexual Activity   Alcohol use: Yes    Comment: social   Drug use: No   Sexual activity: Not on file  Other Topics Concern   Not on file  Social History Narrative   Married, 4 grown children.   Relocated to [place], [place] from [place] about 2012.   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   Exercise: intermittently does water aerobics..            Social Drivers of Health   Financial Resource Strain: Low Risk  (06/28/2023)   Overall Financial Resource Strain (CARDIA)     Difficulty of Paying Living Expenses: Not hard at all  Food Insecurity: No Food Insecurity (06/28/2023)   Hunger Vital Sign    Worried About Running Out of Food in the Last Year: Never true    Ran Out of Food in the Last Year: Never true  Transportation Needs: No Transportation Needs (06/28/2023)   PRAPARE - Administrator, Civil Service (Medical): No    Lack of Transportation (Non-Medical): No  Physical Activity: Inactive (06/28/2023)   Exercise Vital Sign    Days of Exercise  per Week: 0 days    Minutes of Exercise per Session: 0 min  Stress: No Stress Concern Present (06/28/2023)   [employer] of Occupational Health - Occupational Stress Questionnaire    Feeling of Stress : Not at all  Social Connections: Socially Integrated (06/28/2023)   Social Connection and Isolation Panel [NHANES]    Frequency of Communication with Friends and Family: More than three times a week    Frequency of Social Gatherings with Friends and Family: More than three times a week    Attends Religious Services: More than 4 times per year    Active Member of [employer] or Organizations: Yes    Attends Banker Meetings: 1 to 4 times per year    Marital Status: Married  Catering manager Violence: Not At Risk (06/28/2023)   Humiliation, Afraid, Rape, and Kick questionnaire    Fear of Current or Ex-Partner: No    Emotionally Abused: No    Physically Abused: No    Sexually Abused: No    Outpatient Medications Prior to Visit  Medication Sig Dispense Refill   Ascorbic Acid (VITAMIN C) 1000 MG tablet Take 1,000 mg by mouth daily.     blood glucose meter kit and supplies KIT Use to check glucose 1-2 times daily 1 each 0   cholecalciferol (VITAMIN D) 25 MCG (1000 UNIT) tablet Take 1,000 Units by mouth daily.     COLLAGEN PO Take by mouth.     fexofenadine (ALLEGRA) 180 MG tablet Take 180 mg by mouth daily.     fluticasone (FLONASE) 50 MCG/ACT nasal spray Place 1 spray into both nostrils daily.  (Patient taking differently: Place 1 spray into both nostrils daily as needed for allergies.) 16 g 2   glucose blood (ONETOUCH ULTRA) test strip USE TO CHECK BLOOD SUGAR ONCE DAILY 100 strip 12   Lancets (ONETOUCH DELICA PLUS LANCET30G) MISC USE TO CHECK BLOOD SUGAR ONCE DAILY 100 each 12   magnesium gluconate (MAGONATE) 500 MG tablet Take 500 mg by mouth daily.     Multiple Vitamin (MULTIVITAMIN) tablet Take 1 tablet by mouth daily.     OVER THE COUNTER MEDICATION Take 1 tablet by mouth in the morning and at bedtime. GOLO Tablets     Semaglutide, 2 MG/DOSE, 8 MG/3ML SOPN Inject 2 mg as directed once a week. 3 mL 10   albuterol (VENTOLIN HFA) 108 (90 Base) MCG/ACT inhaler Inhale 2 puffs into the lungs every 6 (six) hours as needed for wheezing or shortness of breath. (Patient not taking: Reported on 02/15/2024) 8 g 6   beclomethasone (QVAR REDIHALER) 40 MCG/ACT inhaler Inhale 2 puffs into the lungs 2 (two) times daily. (Patient not taking: Reported on 02/15/2024) 1 each 1   No facility-administered medications prior to visit.    Allergies  Allergen Reactions   Penicillins Nausea Only and Rash    Has patient had a PCN reaction causing immediate rash, facial/tongue/throat swelling, SOB or lightheadedness with hypotension: yES Has patient had a PCN reaction causing severe rash involving mucus membranes or skin necrosis: No Has patient had a PCN reaction that required hospitalization: No Has patient had a PCN reaction occurring within the last 10 years: No If all of the above answers are "NO", then may proceed with Cephalosporin use. CC    Review of Systems  Constitutional:  Negative for appetite change, chills, fatigue and fever.  HENT:  Negative for congestion, dental problem, ear pain and sore throat.   Eyes:  Negative for  discharge, redness and visual disturbance.  Respiratory:  Negative for cough, chest tightness, shortness of breath and wheezing.   Cardiovascular:  Negative for chest  pain, palpitations and leg swelling.  Gastrointestinal:  Negative for abdominal pain, blood in stool, diarrhea, nausea and vomiting.  Genitourinary:  Negative for difficulty urinating, dysuria, flank pain, frequency, hematuria and urgency.  Musculoskeletal:  Negative for arthralgias, back pain, joint swelling, myalgias and neck stiffness.  Skin:  Negative for pallor and rash.  Neurological:  Negative for dizziness, speech difficulty, weakness and headaches.  Hematological:  Negative for adenopathy. Does not bruise/bleed easily.  Psychiatric/Behavioral:  Negative for confusion and sleep disturbance. The patient is not nervous/anxious.     PE;    02/15/2024   10:02 AM 11/13/2023    8:42 AM 10/16/2023    9:06 AM  Vitals with BMI  Height 5' 6.75"    Weight 341 lbs 338 lbs 10 oz 341 lbs 10 oz  BMI 53.84  53.49  Systolic 90 97 94  Diastolic 60 60 63  Pulse 61 84 60   Gen: Alert, well appearing.  Patient is oriented to person, place, time, and situation. AFFECT: pleasant, lucid thought and speech. ENT: Ears: EACs clear, normal epithelium.  TMs with good light reflex and landmarks bilaterally.  Eyes: no injection, icteris, swelling, or exudate.  EOMI, PERRLA. Nose: no drainage or turbinate edema/swelling.  No injection or focal lesion.  Mouth: lips without lesion/swelling.  Oral mucosa pink and moist.  Dentition intact and without obvious caries or gingival swelling.  Oropharynx without erythema, exudate, or swelling.  Neck: supple/nontender.  No LAD, mass, or TM.  Carotid pulses 2+ bilaterally, without bruits. CV: RRR, no m/r/g.   LUNGS: CTA bilat, nonlabored resps, good aeration in all lung fields. ABD: soft, NT, ND, BS normal.  No hepatospenomegaly or mass.  No bruits. EXT: no clubbing, cyanosis, or edema.  Musculoskeletal: no joint swelling, erythema, warmth, or tenderness.  ROM of all joints intact. Skin - no sores or suspicious lesions or rashes or color changes Foot exam -  no  swelling, tenderness or skin or vascular lesions. Color and temperature is normal. Sensation is intact. Peripheral pulses are palpable. Toenails are normal.  Pertinent labs:  Lab Results  Component Value Date   TSH 1.35 01/26/2023   Lab Results  Component Value Date   WBC 7.2 09/11/2023   HGB 11.9 (L) 09/22/2023   HCT 35.0 (L) 09/22/2023   MCV 88 09/11/2023   PLT 270 09/11/2023   Lab Results  Component Value Date   VITAMINB12 283 08/04/2023   Lab Results  Component Value Date   CREATININE 0.79 09/11/2023   BUN 17 09/11/2023   NA 142 09/22/2023   K 4.1 09/22/2023   CL 107 (H) 09/11/2023   CO2 20 09/11/2023   Lab Results  Component Value Date   ALT 12 01/26/2023   AST 14 01/26/2023   ALKPHOS 66 01/26/2023   BILITOT 0.4 01/26/2023   Lab Results  Component Value Date   CHOL 184 08/04/2023   Lab Results  Component Value Date   HDL 77.40 08/04/2023   Lab Results  Component Value Date   LDLCALC 86 08/04/2023   Lab Results  Component Value Date   TRIG 104.0 08/04/2023   Lab Results  Component Value Date   CHOLHDL 2 08/04/2023   Lab Results  Component Value Date   HGBA1C 5.8 (A) 11/13/2023   HGBA1C 5.8 11/13/2023   HGBA1C 5.8 11/13/2023  HGBA1C 5.8 11/13/2023   ASSESSMENT AND PLAN:   #1 health maintenance exam: Reviewed age and gender appropriate health maintenance issues (prudent diet, regular exercise, health risks of tobacco and excessive alcohol, use of seatbelts, fire alarms in home, use of sunscreen).  Also reviewed age and gender appropriate health screening as well as vaccine recommendations. Vaccines: all UTD. Labs: fasting HP ordered. Cervical ca screening: n/a-->pt with hysterectomy for benign dx. Breast ca screening: mammogram was done today, result pending. Colon ca screening: recall 2032. Osteoporosis screening: DEXA 06/2021 normal.  DEXA ordered today.  #2 Weight management: Obesity, BMI is 53. Comorbidities: Prediabetes, knee arthritis  and impaired ambulation and ability to exercise, obstructive sleep apnea. POC Hba1c today is 5.8%, stable. No signif wt loss on the medication yet. Cont ozempic 2mg  SQ q7d.  An After Visit Summary was printed and given to the patient.  FOLLOW UP:  No follow-ups on file.  Signed:  Santiago Bumpers, MD           02/15/2024

## 2024-02-15 ENCOUNTER — Encounter: Payer: Self-pay | Admitting: Family Medicine

## 2024-02-15 ENCOUNTER — Ambulatory Visit (INDEPENDENT_AMBULATORY_CARE_PROVIDER_SITE_OTHER): Payer: Medicare Other | Admitting: Family Medicine

## 2024-02-15 ENCOUNTER — Ambulatory Visit
Admission: RE | Admit: 2024-02-15 | Discharge: 2024-02-15 | Disposition: A | Discharge status: Home / Self Care | Payer: Medicare Other | Source: Ambulatory Visit

## 2024-02-15 VITALS — BP 90/60 | HR 61 | Ht 66.75 in | Wt 341.0 lb

## 2024-02-15 DIAGNOSIS — E348 Other specified endocrine disorders: Secondary | ICD-10-CM

## 2024-02-15 DIAGNOSIS — Z7689 Persons encountering health services in other specified circumstances: Secondary | ICD-10-CM

## 2024-02-15 DIAGNOSIS — Z0000 Encounter for general adult medical examination without abnormal findings: Secondary | ICD-10-CM | POA: Diagnosis not present

## 2024-02-15 DIAGNOSIS — Z1231 Encounter for screening mammogram for malignant neoplasm of breast: Secondary | ICD-10-CM | POA: Diagnosis not present

## 2024-02-15 DIAGNOSIS — R7303 Prediabetes: Secondary | ICD-10-CM

## 2024-02-15 LAB — TSH: TSH: 1.31 u[IU]/mL (ref 0.35–5.50)

## 2024-02-15 LAB — POCT GLYCOSYLATED HEMOGLOBIN (HGB A1C)
HbA1c POC (<> result, manual entry): 5.8 % (ref 4.0–5.6)
HbA1c, POC (controlled diabetic range): 5.8 % (ref 0.0–7.0)
HbA1c, POC (prediabetic range): 5.8 % (ref 5.7–6.4)
Hemoglobin A1C: 5.8 % — AB (ref 4.0–5.6)

## 2024-02-15 LAB — COMPREHENSIVE METABOLIC PANEL
ALT: 14 U/L (ref 0–35)
AST: 16 U/L (ref 0–37)
Albumin: 4.3 g/dL (ref 3.5–5.2)
Alkaline Phosphatase: 84 U/L (ref 39–117)
BUN: 15 mg/dL (ref 6–23)
CO2: 30 meq/L (ref 19–32)
Calcium: 10.3 mg/dL (ref 8.4–10.5)
Chloride: 104 meq/L (ref 96–112)
Creatinine, Ser: 0.89 mg/dL (ref 0.40–1.20)
GFR: 66.8 mL/min (ref >60.00)
Glucose, Bld: 105 mg/dL — ABNORMAL HIGH (ref 70–99)
Potassium: 4.8 meq/L (ref 3.5–5.1)
Sodium: 142 meq/L (ref 135–145)
Total Bilirubin: 0.6 mg/dL (ref 0.2–1.2)
Total Protein: 7.4 g/dL (ref 6.0–8.3)

## 2024-02-15 LAB — LIPID PANEL
Cholesterol: 199 mg/dL (ref 0–200)
HDL: 83.4 mg/dL (ref >39.00)
LDL Cholesterol: 89 mg/dL (ref 0–99)
NonHDL: 115.59
Total CHOL/HDL Ratio: 2
Triglycerides: 135 mg/dL (ref 0.0–149.0)
VLDL: 27 mg/dL (ref 0.0–40.0)

## 2024-02-15 LAB — MICROALBUMIN / CREATININE URINE RATIO
Creatinine,U: 170 mg/dL
Microalb Creat Ratio: 5.1 mg/g (ref 0.0–30.0)
Microalb, Ur: 0.9 mg/dL (ref 0.0–1.9)

## 2024-02-15 LAB — CBC
HCT: 45.7 % (ref 36.0–46.0)
Hemoglobin: 14.8 g/dL (ref 12.0–15.0)
MCHC: 32.5 g/dL (ref 30.0–36.0)
MCV: 86.4 fL (ref 78.0–100.0)
Platelets: 294 10*3/uL (ref 150.0–400.0)
RBC: 5.29 Mil/uL — ABNORMAL HIGH (ref 3.87–5.11)
RDW: 16.1 % — ABNORMAL HIGH (ref 11.5–15.5)
WBC: 9.5 10*3/uL (ref 4.0–10.5)

## 2024-02-15 NOTE — Patient Instructions (Signed)

## 2024-02-19 ENCOUNTER — Ambulatory Visit (HOSPITAL_BASED_OUTPATIENT_CLINIC_OR_DEPARTMENT_OTHER)
Admission: RE | Admit: 2024-02-19 | Discharge: 2024-02-19 | Disposition: A | Discharge status: Home / Self Care | Payer: Medicare Other | Source: Ambulatory Visit | Attending: Family Medicine | Admitting: Family Medicine

## 2024-02-19 ENCOUNTER — Encounter: Payer: Self-pay | Admitting: Family Medicine

## 2024-02-19 DIAGNOSIS — Z13820 Encounter for screening for osteoporosis: Secondary | ICD-10-CM | POA: Insufficient documentation

## 2024-02-19 DIAGNOSIS — E348 Other specified endocrine disorders: Secondary | ICD-10-CM | POA: Diagnosis not present

## 2024-02-19 DIAGNOSIS — Z87891 Personal history of nicotine dependence: Secondary | ICD-10-CM | POA: Insufficient documentation

## 2024-02-19 DIAGNOSIS — Z780 Asymptomatic menopausal state: Secondary | ICD-10-CM | POA: Diagnosis not present

## 2024-02-23 DIAGNOSIS — J45909 Unspecified asthma, uncomplicated: Secondary | ICD-10-CM | POA: Diagnosis not present

## 2024-02-23 DIAGNOSIS — G4733 Obstructive sleep apnea (adult) (pediatric): Secondary | ICD-10-CM | POA: Diagnosis not present

## 2024-02-24 DIAGNOSIS — E119 Type 2 diabetes mellitus without complications: Secondary | ICD-10-CM | POA: Diagnosis not present

## 2024-03-07 ENCOUNTER — Other Ambulatory Visit: Payer: Self-pay | Admitting: Pulmonary Disease

## 2024-03-07 NOTE — Telephone Encounter (Signed)
 Patient is calling in regards to pharmacy request for Qvar. Patient is also needing albuterol inhaler if possible at this time. Patient is not due for an appointment until 08/2024. Please advise

## 2024-03-08 MED ORDER — ALBUTEROL SULFATE HFA 108 (90 BASE) MCG/ACT IN AERS: 2.0000 | INHALATION_SPRAY | Freq: Four times a day (QID) | RESPIRATORY_TRACT | 3 refills | Status: AC | PRN

## 2024-03-22 DIAGNOSIS — J45909 Unspecified asthma, uncomplicated: Secondary | ICD-10-CM | POA: Diagnosis not present

## 2024-03-22 DIAGNOSIS — G4733 Obstructive sleep apnea (adult) (pediatric): Secondary | ICD-10-CM | POA: Diagnosis not present

## 2024-03-26 DIAGNOSIS — E119 Type 2 diabetes mellitus without complications: Secondary | ICD-10-CM | POA: Diagnosis not present

## 2024-04-22 ENCOUNTER — Telehealth: Payer: Self-pay

## 2024-04-22 DIAGNOSIS — J45909 Unspecified asthma, uncomplicated: Secondary | ICD-10-CM | POA: Diagnosis not present

## 2024-04-22 DIAGNOSIS — Z87891 Personal history of nicotine dependence: Secondary | ICD-10-CM

## 2024-04-22 DIAGNOSIS — G4733 Obstructive sleep apnea (adult) (pediatric): Secondary | ICD-10-CM | POA: Diagnosis not present

## 2024-04-22 NOTE — Addendum Note (Signed)
 Addended by: Shelvia Dick on: 04/22/2024 08:41 PM   Modules accepted: Orders

## 2024-04-22 NOTE — Telephone Encounter (Signed)
 Copied from CRM [id number]. Topic: General - Other >> Apr 19, 2024 12:05 PM Annelle Kiel wrote: Reason for CRM: patient is needing a call back regarding her lung screening appt she needs set up

## 2024-04-22 NOTE — Telephone Encounter (Signed)
 Please advise if pt is due for repeat lung screening, last completed 04/24/23.

## 2024-04-25 DIAGNOSIS — E119 Type 2 diabetes mellitus without complications: Secondary | ICD-10-CM | POA: Diagnosis not present

## 2024-04-26 ENCOUNTER — Telehealth (HOSPITAL_BASED_OUTPATIENT_CLINIC_OR_DEPARTMENT_OTHER): Payer: Self-pay

## 2024-05-14 ENCOUNTER — Ambulatory Visit (INDEPENDENT_AMBULATORY_CARE_PROVIDER_SITE_OTHER): Payer: Medicare Other | Admitting: Family Medicine

## 2024-05-14 ENCOUNTER — Encounter: Payer: Self-pay | Admitting: Family Medicine

## 2024-05-14 VITALS — BP 107/57 | HR 66 | Wt 346.8 lb

## 2024-05-14 DIAGNOSIS — Z7689 Persons encountering health services in other specified circumstances: Secondary | ICD-10-CM

## 2024-05-14 DIAGNOSIS — Z6841 Body Mass Index (BMI) 40.0 and over, adult: Secondary | ICD-10-CM | POA: Diagnosis not present

## 2024-05-14 DIAGNOSIS — R7303 Prediabetes: Secondary | ICD-10-CM

## 2024-05-14 LAB — POCT GLYCOSYLATED HEMOGLOBIN (HGB A1C)
HbA1c POC (<> result, manual entry): 5.9 % (ref 4.0–5.6)
HbA1c, POC (controlled diabetic range): 5.9 % (ref 0.0–7.0)
HbA1c, POC (prediabetic range): 5.9 % (ref 5.7–6.4)
Hemoglobin A1C: 5.9 % — AB (ref 4.0–5.6)

## 2024-05-14 MED ORDER — SEMAGLUTIDE (2 MG/DOSE) 8 MG/3ML ~~LOC~~ SOPN: 2.0000 mg | PEN_INJECTOR | SUBCUTANEOUS | 10 refills | Status: DC

## 2024-05-14 NOTE — Progress Notes (Signed)
 OFFICE VISIT  05/14/2024  CC:  Chief Complaint  Patient presents with   Diabetes    Patient is a 68 y.o. female who presents for 109-month follow-up prediabetes and weight management, BMI 53. A/P as of last visit: "Weight management: Obesity, BMI is 53. Comorbidities: Prediabetes, knee arthritis and impaired ambulation and ability to exercise, obstructive sleep apnea. POC Hba1c today is 5.8%, stable. No signif wt loss on the medication yet. Cont ozempic  2mg  SQ q7d."  INTERIM HX: She is frustrated with inability to lose weight despite being on Ozempic . She says she eats smaller volumes of food and only eats breakfast and supper.  She is unable to do much physical exercise at all due to her chronic knee osteoarthritis.  ROS --> no fevers, no CP, no SOB, no wheezing, no cough, no dizziness, no HAs, no rashes, no melena/hematochezia.  No polyuria or polydipsia.    No focal weakness, paresthesias, or tremors.  No acute vision or hearing abnormalities.  No dysuria or unusual/new urinary urgency or frequency.  No recent changes in lower legs. No n/v/d or abd pain.  No palpitations.    Past Medical History:  Diagnosis Date   BPPV (benign paroxysmal positional vertigo) 01/30/2014   CAP (community acquired pneumonia) 10/2018   with acute hypoxic RF, empyema-->VATS   Cyst, breast 01/2012; 06/2012; 02/26/13; 03/03/14   Complicated cysts in upper outer left breast--no evidence of malignancy--f/u bilat diag mammo/left breast u/s on 03/06/15 showed resolution of left breast cysts.  Repeat screening mammogram 1 yr.   Empyema (HCC)    drained by CT surgery/VATS.  Full recovery as of 12/2018 CT surgery f/u visit.   H/O allergic rhinitis    Dr. Lonni Robert   Hepatic cyst 2019   Noted on CT chest imaging.  Multiple, small, "simple"   History of pneumonia 2009 and 2010   supplemential oxygen d/c'd 12/16/18 by pulm   Mild persistent asthma, well controlled 2014   New adult onset asthma after respiratory  infection (Dr. Lonni Robert)   Morbid obesity (HCC)    BMI 50+.  At one point in time she was going to Bariatric clinic on [address] and got weekly HCG injections, monthly vit B12 injections, and took phentermine daily.  As of 05/2015 she was no longer doing this.   Nephrolithiasis 04/2018 first episode   Dr. Sherrine Dolly ([facility]): 1.32mm right UVJ stone + 6 mm L AML.  Pt elected for trial of passage.   OSA (obstructive sleep apnea) 10/2018   Mild/mod OSA 12/2018--pt not able to afford CPAP as of 01/2019.   Osteoarthritis of both knees    No improvement with steroid injections; did synvisc x 3 yrs; bilat replacement was recommended but she declined.   Prediabetes    she's on metformin    Pulmonary nodule 06/29/2021   on lung ca screening CT->repeat 45mo.  This has been stable as of 03/2023   Recurrent low back pain    Thyroid  nodule 03/2022   1 on each side, needs rpt u/s 03/2023    Past Surgical History:  Procedure Laterality Date   APPENDECTOMY  1978   CARDIAC CATHETERIZATION  2010   Normal coronaries per pt.  08/2023 normal PA pressure and normal coronaries   CARDIOVASCULAR STRESS TEST  2010   abnl per pt; f/u cath clean   CESAREAN SECTION     x 3   CHOLECYSTECTOMY  1986   COLONOSCOPY  2011   lipoma and small diminutive polyp in rectum (in [place])-->[facility] to do repeat as of 04/05/21   COLONOSCOPY  06/15/2021   Normal-recall 10 yrs   COLONOSCOPY WITH PROPOFOL  N/A 06/15/2021   Procedure: COLONOSCOPY WITH PROPOFOL ;  Surgeon: Felecia Hopper, MD;  Location: [facility];  Service: Gastroenterology;  Laterality: N/A;   DEXA  06/2021   06/2021 NORMAL.  01/2024 normal.   EMPYEMA DRAINAGE Left 10/24/2018   Evacuation of empyema with decortication.  Procedure: EMPYEMA DRAINAGE;  Surgeon: Norita Beauvais, MD;  Location: [facility] OR;  Service: Thoracic;  Laterality: Left;   LUMBAR DISC SURGERY  1993   L5/S1   RIGHT/LEFT HEART CATH AND CORONARY ANGIOGRAPHY N/A 09/22/2023   Procedure: RIGHT/LEFT HEART  CATH AND CORONARY ANGIOGRAPHY;  Surgeon: Millicent Ally, MD;  [facility];  Service: Cardiovascular;  Laterality: N/A;   TONSILLECTOMY AND ADENOIDECTOMY  1967ish   TOTAL ABDOMINAL HYSTERECTOMY  1994   Ovaries are still in.  This was done for DUB/fibroids.   TRANSTHORACIC ECHOCARDIOGRAM     05/2023 NORMAL   VIDEO ASSISTED THORACOSCOPY (VATS)/THOROCOTOMY Left 10/24/2018   Procedure: VIDEO ASSISTED THORACOSCOPY (VATS)/THOROCOTOMY with EVACUATION OF EMPYEMA AND DECORTICATION.;  Surgeon: Norita Beauvais, MD;  Location: [facility] OR;  Service: Thoracic;  Laterality: Left;   VIDEO BRONCHOSCOPY N/A 10/24/2018   Procedure: VIDEO BRONCHOSCOPY;  Surgeon: Norita Beauvais, MD;  Location: [facility] OR;  Service: Thoracic;  Laterality: N/A;    Outpatient Medications Prior to Visit  Medication Sig Dispense Refill   albuterol  (VENTOLIN  HFA) 108 (90 Base) MCG/ACT inhaler Inhale 2 puffs into the lungs every 6 (six) hours as needed for wheezing or shortness of breath. 8 g 3   Ascorbic Acid (VITAMIN C) 1000 MG tablet Take 1,000 mg by mouth daily.     beclomethasone (QVAR  REDIHALER) 40 MCG/ACT inhaler INHALE 2 PUFFS INTO THE LUNGS TWICE A DAY 10.6 g 3   blood glucose meter kit and supplies KIT Use to check glucose 1-2 times daily 1 each 0   cholecalciferol (VITAMIN D) 25 MCG (1000 UNIT) tablet Take 1,000 Units by mouth daily.     COLLAGEN PO Take by mouth.     fexofenadine  (ALLEGRA ) 180 MG tablet Take 180 mg by mouth daily.     fluticasone  (FLONASE ) 50 MCG/ACT nasal spray Place 1 spray into both nostrils daily. (Patient taking differently: Place 1 spray into both nostrils daily as needed for allergies.) 16 g 2   glucose blood (ONETOUCH ULTRA) test strip USE TO CHECK BLOOD SUGAR ONCE DAILY 100 strip 12   Lancets (ONETOUCH DELICA PLUS LANCET30G) MISC USE TO CHECK BLOOD SUGAR ONCE DAILY 100 each 12   magnesium gluconate (MAGONATE) 500 MG tablet Take 500 mg by mouth daily.     Multiple Vitamin (MULTIVITAMIN)  tablet Take 1 tablet by mouth daily.     OVER THE COUNTER MEDICATION Take 1 tablet by mouth in the morning and at bedtime. GOLO Tablets     Semaglutide , 2 MG/DOSE, 8 MG/3ML SOPN Inject 2 mg as directed once a week. 3 mL 10   No facility-administered medications prior to visit.    Allergies  Allergen Reactions   Penicillins Nausea Only and Rash    Has patient had a PCN reaction causing immediate rash, facial/tongue/throat swelling, SOB or lightheadedness with hypotension: yES Has patient had a PCN reaction causing severe rash involving mucus membranes or skin necrosis: No Has patient had a PCN reaction that required hospitalization: No Has patient had a PCN reaction occurring within the last 10 years: No  If all of the above answers are "NO", then may proceed with Cephalosporin use. CC    Review of Systems As per HPI  PE:    05/14/2024    9:06 AM 02/15/2024   10:02 AM 11/13/2023    8:42 AM  Vitals with BMI  Height  5' 6.75"   Weight 346 lbs 13 oz 341 lbs 338 lbs 10 oz  BMI  53.84   Systolic 107 90 97  Diastolic 57 60 60  Pulse 66 61 84     Physical Exam  Gen: Alert, well appearing.  Patient is oriented to person, place, time, and situation. AFFECT: pleasant, lucid thought and speech. No further exam today  LABS:  Last CBC Lab Results  Component Value Date   WBC 9.5 02/15/2024   HGB 14.8 02/15/2024   HCT 45.7 02/15/2024   MCV 86.4 02/15/2024   MCH 27.4 09/11/2023   RDW 16.1 (H) 02/15/2024   PLT 294.0 02/15/2024   Last metabolic panel Lab Results  Component Value Date   GLUCOSE 105 (H) 02/15/2024   NA 142 02/15/2024   K 4.8 02/15/2024   CL 104 02/15/2024   CO2 30 02/15/2024   BUN 15 02/15/2024   CREATININE 0.89 02/15/2024   GFR 66.80 02/15/2024   CALCIUM 10.3 02/15/2024   PHOS 2.8 10/26/2018   PROT 7.4 02/15/2024   ALBUMIN 4.3 02/15/2024   BILITOT 0.6 02/15/2024   ALKPHOS 84 02/15/2024   AST 16 02/15/2024   ALT 14 02/15/2024   ANIONGAP 7 10/30/2018    Last lipids Lab Results  Component Value Date   CHOL 199 02/15/2024   HDL 83.40 02/15/2024   LDLCALC 89 02/15/2024   TRIG 135.0 02/15/2024   CHOLHDL 2 02/15/2024   Last hemoglobin A1c Lab Results  Component Value Date   HGBA1C 5.9 (A) 05/14/2024   HGBA1C 5.9 05/14/2024   HGBA1C 5.9 05/14/2024   HGBA1C 5.9 05/14/2024   Last thyroid  functions Lab Results  Component Value Date   TSH 1.31 02/15/2024   Last vitamin B12 and Folate Lab Results  Component Value Date   VITAMINB12 283 08/04/2023   IMPRESSION AND PLAN:  Diabetes, weight management: Obesity, BMI is 54. Comorbidities: Prediabetes, knee arthritis and impaired ambulation and ability to exercise, obstructive sleep apnea. POC Hba1c today is 5.9%, stable. No signif wt loss on the medication yet. Cont ozempic  2mg  SQ q7d.  She has chronic high frustration/stress which is mainly due to her relationship with her husband. He is chronically ill and has a poor attitude and she has to do everything for both of them. I think her stress hormone levels are likely really high and contributing quite a bit to her inability to lose weight.  Her annual lung cancer screening CT is scheduled for 05/23/2024.  An After Visit Summary was printed and given to the patient.  FOLLOW UP: Return in about 3 months (around 08/14/2024) for routine chronic illness f/u. Next CPE February 2026 Signed:  Arletha Lady, MD           05/14/2024

## 2024-05-23 ENCOUNTER — Ambulatory Visit (HOSPITAL_BASED_OUTPATIENT_CLINIC_OR_DEPARTMENT_OTHER)
Admission: RE | Admit: 2024-05-23 | Discharge: 2024-05-23 | Disposition: A | Discharge status: Home / Self Care | Source: Ambulatory Visit | Attending: Family Medicine | Admitting: Family Medicine

## 2024-05-23 DIAGNOSIS — F17210 Nicotine dependence, cigarettes, uncomplicated: Secondary | ICD-10-CM | POA: Diagnosis not present

## 2024-05-23 DIAGNOSIS — Z87891 Personal history of nicotine dependence: Secondary | ICD-10-CM | POA: Insufficient documentation

## 2024-05-26 DIAGNOSIS — E119 Type 2 diabetes mellitus without complications: Secondary | ICD-10-CM | POA: Diagnosis not present

## 2024-05-27 ENCOUNTER — Encounter: Payer: Self-pay | Admitting: Family Medicine

## 2024-05-28 ENCOUNTER — Other Ambulatory Visit (HOSPITAL_COMMUNITY): Payer: Self-pay

## 2024-05-28 ENCOUNTER — Telehealth: Payer: Self-pay

## 2024-05-28 MED ORDER — TIRZEPATIDE-WEIGHT MANAGEMENT 2.5 MG/0.5ML ~~LOC~~ SOLN: 2.5000 mg | SUBCUTANEOUS | 2 refills | Status: DC

## 2024-05-28 NOTE — Telephone Encounter (Signed)
 As always, insurance will determine whether or not you can have this medication. I have sent a prescription to your pharmacy and we will see what happens.

## 2024-05-28 NOTE — Telephone Encounter (Signed)
 Pharmacy Patient Advocate Encounter   Received notification from CoverMyMeds that prior authorization for Zepbound  2.5 is required/requested.   Insurance verification completed.   The patient is insured through [facility] .   Per test claim: PA required; PA submitted to above mentioned insurance via CoverMyMeds Key/confirmation #/EOC [id number] Status is pending Pharmacy Patient Advocate Encounter  Received notification from [facility] that Prior Authorization for Zepbound  2.5 has been APPROVED from 05/28/24 to 05/28/25. Unable to obtain price due to refill too soon rejection, last fill date 05/20/24 next available fill date not avail   PA #/Case ID/Reference #: [id number]

## 2024-05-29 ENCOUNTER — Other Ambulatory Visit (HOSPITAL_COMMUNITY): Payer: Self-pay

## 2024-05-29 NOTE — Telephone Encounter (Signed)
 Copied from CRM [id number]. Topic: Clinical - Medication Prior Auth >> May 29, 2024  8:37 AM Martinique E wrote: Reason for CRM: Hospital doctor from [place] called in stating that the patient's Zepbound  prior authorization has been approved for one full year, until June 3rd 2026. Amber stated she will also fax this over to the office. Callback number for Amber is [phone] opt. 5.

## 2024-06-03 DIAGNOSIS — G4733 Obstructive sleep apnea (adult) (pediatric): Secondary | ICD-10-CM | POA: Diagnosis not present

## 2024-06-10 ENCOUNTER — Ambulatory Visit: Payer: Self-pay | Admitting: Family Medicine

## 2024-06-25 DIAGNOSIS — E119 Type 2 diabetes mellitus without complications: Secondary | ICD-10-CM | POA: Diagnosis not present

## 2024-07-08 ENCOUNTER — Encounter: Payer: Self-pay | Admitting: Family Medicine

## 2024-07-08 ENCOUNTER — Telehealth: Payer: Self-pay | Admitting: Family Medicine

## 2024-07-08 MED ORDER — TIRZEPATIDE-WEIGHT MANAGEMENT 2.5 MG/0.5ML ~~LOC~~ SOLN: 2.5000 mg | SUBCUTANEOUS | 0 refills | Status: DC

## 2024-07-08 NOTE — Telephone Encounter (Signed)
 Copied from CRM [id number]. Topic: Clinical - Medication Refill >> Jul 08, 2024  9:42 AM Geroldine GRADE wrote: Medication: tirzepatide  (ZEPBOUND ) 5MG    Has the patient contacted their pharmacy? Yes (Agent: If no, request that the patient contact the pharmacy for the refill. If patient does not wish to contact the pharmacy document the reason why and proceed with request.) (Agent: If yes, when and what did the pharmacy advise?)  This is the patient's preferred pharmacy:  [id number] [address] - [address] [place] [address] Phone: [phone] Fax: [phone]  Is this the correct pharmacy for this prescription? Yes If no, delete pharmacy and type the correct one.   Has the prescription been filled recently? No  Is the patient out of the medication? No, going out of town on 7/22 will be out then  Has the patient been seen for an appointment in the last year OR does the patient have an upcoming appointment? Yes  Can we respond through MyChart? Yes  Agent: Please be advised that Rx refills may take up to 3 business days. We ask that you follow-up with your pharmacy.

## 2024-07-09 MED ORDER — TIRZEPATIDE-WEIGHT MANAGEMENT 5 MG/0.5ML ~~LOC~~ SOLN: 5.0000 mg | SUBCUTANEOUS | 1 refills | Status: DC

## 2024-07-09 NOTE — Telephone Encounter (Signed)
 Okay, Zepbound  5 mg prescription sent

## 2024-07-10 DIAGNOSIS — M170 Bilateral primary osteoarthritis of knee: Secondary | ICD-10-CM | POA: Diagnosis not present

## 2024-07-11 DIAGNOSIS — K080 Exfoliation of teeth due to systemic causes: Secondary | ICD-10-CM | POA: Diagnosis not present

## 2024-07-18 NOTE — Procedures (Signed)
 The patient arrived to the [facility] with c/o mask discomfort. The patient is currently using a Resmed P10, however due to oral venting the paitents mask type was switched to a Resmed Airfit F30i (SW). The patient tolerated the trial well with no noted complications. The trial mask given to the patient for home use.  -VB

## 2024-07-26 ENCOUNTER — Telehealth: Payer: Self-pay | Admitting: Pharmacy Technician

## 2024-07-26 DIAGNOSIS — E119 Type 2 diabetes mellitus without complications: Secondary | ICD-10-CM | POA: Diagnosis not present

## 2024-07-26 NOTE — Telephone Encounter (Signed)
 Pharmacy Patient Advocate Encounter   Received notification from CoverMyMeds that prior authorization for Ozempic  (0.25 or 0.5 MG/DOSE) 2MG /3ML pen-injectors is due for renewal.   Insurance verification completed.   The patient is insured through [facility].  Action: Medication has been discontinued. Archived Key: [id number]

## 2024-07-31 DIAGNOSIS — E119 Type 2 diabetes mellitus without complications: Secondary | ICD-10-CM | POA: Diagnosis not present

## 2024-07-31 LAB — HM DIABETES EYE EXAM

## 2024-08-01 DIAGNOSIS — K080 Exfoliation of teeth due to systemic causes: Secondary | ICD-10-CM | POA: Diagnosis not present

## 2024-08-12 ENCOUNTER — Encounter: Payer: Self-pay | Admitting: Family Medicine

## 2024-08-12 MED ORDER — TIRZEPATIDE-WEIGHT MANAGEMENT 7.5 MG/0.5ML ~~LOC~~ SOLN
7.5000 mg | SUBCUTANEOUS | 5 refills | Status: DC
Start: 2024-08-12 — End: 2024-09-09

## 2024-08-12 NOTE — Telephone Encounter (Signed)
7.5 mg zepbound sent

## 2024-08-13 ENCOUNTER — Other Ambulatory Visit: Payer: Self-pay | Admitting: Medical Genetics

## 2024-08-15 ENCOUNTER — Other Ambulatory Visit (HOSPITAL_COMMUNITY)
Admission: RE | Admit: 2024-08-15 | Discharge: 2024-08-15 | Disposition: A | Discharge status: Home / Self Care | Payer: Self-pay | Source: Ambulatory Visit | Attending: Oncology | Admitting: Oncology

## 2024-08-16 ENCOUNTER — Encounter: Payer: Self-pay | Admitting: Family Medicine

## 2024-08-16 ENCOUNTER — Ambulatory Visit (INDEPENDENT_AMBULATORY_CARE_PROVIDER_SITE_OTHER): Admitting: Family Medicine

## 2024-08-16 VITALS — BP 97/68 | HR 58 | Temp 98.0°F | Ht 66.75 in | Wt 338.6 lb

## 2024-08-16 DIAGNOSIS — R7303 Prediabetes: Secondary | ICD-10-CM | POA: Diagnosis not present

## 2024-08-16 DIAGNOSIS — M7541 Impingement syndrome of right shoulder: Secondary | ICD-10-CM | POA: Diagnosis not present

## 2024-08-16 DIAGNOSIS — Z7689 Persons encountering health services in other specified circumstances: Secondary | ICD-10-CM

## 2024-08-16 DIAGNOSIS — M25512 Pain in left shoulder: Secondary | ICD-10-CM

## 2024-08-16 DIAGNOSIS — Z6841 Body Mass Index (BMI) 40.0 and over, adult: Secondary | ICD-10-CM

## 2024-08-16 DIAGNOSIS — M25511 Pain in right shoulder: Secondary | ICD-10-CM | POA: Diagnosis not present

## 2024-08-16 DIAGNOSIS — M7542 Impingement syndrome of left shoulder: Secondary | ICD-10-CM

## 2024-08-16 LAB — POCT GLYCOSYLATED HEMOGLOBIN (HGB A1C)
HbA1c POC (<> result, manual entry): 5.8 % (ref 4.0–5.6)
HbA1c, POC (controlled diabetic range): 5.8 % (ref 0.0–7.0)
HbA1c, POC (prediabetic range): 5.8 % (ref 5.7–6.4)
Hemoglobin A1C: 5.8 % — AB (ref 4.0–5.6)

## 2024-08-16 MED ORDER — ONETOUCH DELICA PLUS LANCET30G MISC: 12 refills | Status: AC

## 2024-08-16 MED ORDER — ONETOUCH ULTRA VI STRP: ORAL_STRIP | 12 refills | Status: AC

## 2024-08-16 NOTE — Progress Notes (Signed)
 OFFICE VISIT  08/16/2024  CC:  Chief Complaint  Patient presents with   Medical Management of Chronic Issues    Patient is a 68 y.o. female who presents for 27-month follow-up prediabetes and weight management encounter. A/P as of last visit: PreDiabetes, weight management: Obesity, BMI is 54. Comorbidities: Prediabetes, knee arthritis and impaired ambulation and ability to exercise, obstructive sleep apnea. POC Hba1c today is 5.9%, stable. No signif wt loss on the medication yet. Cont ozempic  2mg  SQ q7d.   She has chronic high frustration/stress which is mainly due to her relationship with her husband. He is chronically ill and has a poor attitude and she has to do everything for both of them. I think her stress hormone levels are likely really high and contributing quite a bit to her inability to lose weight.  INTERIM HX: Since last visit we switched her over to Zepbound  and we just increased her dose to the 7.5 mg weekly dose on the most recent injection.  She is pleased with her weight loss.  Taylor Leon is feeling well other than chronic shoulder pain.  Currently is her left shoulder but sometimes the right, although never as bad as the left shoulder. 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 Abduction and rotation of the shoulder makes the pain worse.  Hard to sleep on the shoulder due to pain. No pain in the neck or down the arm.  No paresthesias or arm weakness.    Just a few days ago we increased her Zepbound  dose to 7.5 mg/week.   Past Medical History:  Diagnosis Date   BPPV (benign paroxysmal positional vertigo) 01/30/2014   CAP (community acquired pneumonia) 10/2018   with acute hypoxic RF, empyema-->VATS   Cyst, breast 01/2012; 06/2012; 02/26/13; 03/03/14   Complicated cysts in upper outer left breast--no evidence of malignancy--f/u bilat diag mammo/left breast u/s on 03/06/15  showed resolution of left breast cysts.  Repeat screening mammogram 1 yr.   Empyema (HCC)    drained by CT surgery/VATS.  Full recovery as of 12/2018 CT surgery f/u visit.   H/O allergic rhinitis    Dr. Vinie   Hepatic cyst 2019   Noted on CT chest imaging.  Multiple, small, simple   History of pneumonia 2009 and 2010   supplemential oxygen d/c'd 12/16/18 by pulm   Mild persistent asthma, well controlled 2014   New adult onset asthma after respiratory infection (Dr. Vinie)   Morbid obesity (HCC)    BMI 50+.  At one point in time she was going to Bariatric clinic on [address] and got weekly HCG injections, monthly vit B12 injections, and took phentermine daily.  As of 05/2015 she was no longer doing this.   Nephrolithiasis 04/2018 first episode   Dr. Devere ([facility]): 1.39mm right UVJ stone + 6 mm L AML.  Pt elected for trial of passage.   OSA (obstructive sleep apnea) 10/2018   Mild/mod OSA 12/2018--pt not able to afford CPAP as of 01/2019.   Osteoarthritis of both knees    No improvement with steroid injections; did synvisc x 3 yrs; bilat replacement was recommended but she declined.   Prediabetes    she's on metformin    Pulmonary nodule 06/29/2021   on lung ca screening CT->repeat 25mo.  This has been stable as of 03/2023   Recurrent low back pain    Thyroid  nodule 03/2022  1 on each side, needs rpt u/s 03/2023    Past Surgical History:  Procedure Laterality Date   APPENDECTOMY  1978   CARDIAC CATHETERIZATION  2010   Normal coronaries per pt.  08/2023 normal PA pressure and normal coronaries   CARDIOVASCULAR STRESS TEST  2010   abnl per pt; f/u cath clean   CESAREAN SECTION     x 3   CHOLECYSTECTOMY  1986   COLONOSCOPY  2011   lipoma and small diminutive polyp in rectum (in [place])-->[facility] to do repeat as of 04/05/21   COLONOSCOPY  06/15/2021   Normal-recall 10 yrs   COLONOSCOPY WITH PROPOFOL  N/A 06/15/2021   Procedure: COLONOSCOPY WITH PROPOFOL ;  Surgeon: Elicia Claw, MD;  Location: [facility];  Service: Gastroenterology;  Laterality: N/A;   DEXA  06/2021   06/2021 NORMAL.  01/2024 normal.   EMPYEMA DRAINAGE Left 10/24/2018   Evacuation of empyema with decortication.  Procedure: EMPYEMA DRAINAGE;  Surgeon: Army Dallas NOVAK, MD;  Location: [facility];  Service: Thoracic;  Laterality: Left;   LUMBAR DISC SURGERY  1993   L5/S1   RIGHT/LEFT HEART CATH AND CORONARY ANGIOGRAPHY N/A 09/22/2023   Procedure: RIGHT/LEFT HEART CATH AND CORONARY ANGIOGRAPHY;  Surgeon: Burnard Debby LABOR, MD;  Location: [facility];  Service: Cardiovascular;  Laterality: N/A;   TONSILLECTOMY AND ADENOIDECTOMY  1967ish   TOTAL ABDOMINAL HYSTERECTOMY  1994   Ovaries are still in.  This was done for DUB/fibroids.   TRANSTHORACIC ECHOCARDIOGRAM     05/2023 NORMAL   VIDEO ASSISTED THORACOSCOPY (VATS)/THOROCOTOMY Left 10/24/2018   Procedure: VIDEO ASSISTED THORACOSCOPY (VATS)/THOROCOTOMY with EVACUATION OF EMPYEMA AND DECORTICATION.;  Surgeon: Army Dallas NOVAK, MD;  Location: [facility] OR;  Service: Thoracic;  Laterality: Left;   VIDEO BRONCHOSCOPY N/A 10/24/2018   Procedure: VIDEO BRONCHOSCOPY;  Surgeon: Army Dallas NOVAK, MD;  Location: [facility] OR;  Service: Thoracic;  Laterality: N/A;    Outpatient Medications Prior to Visit  Medication Sig Dispense Refill   albuterol  (VENTOLIN  HFA) 108 (90 Base) MCG/ACT inhaler Inhale 2 puffs into the lungs every 6 (six) hours as needed for wheezing or shortness of breath. 8 g 3   Ascorbic Acid (VITAMIN C) 1000 MG tablet Take 1,000 mg by mouth daily.     beclomethasone (QVAR  REDIHALER) 40 MCG/ACT inhaler INHALE 2 PUFFS INTO THE LUNGS TWICE A DAY 10.6 g 3   blood glucose meter kit and supplies KIT Use to check glucose 1-2 times daily 1 each 0   cholecalciferol (VITAMIN D) 25 MCG (1000 UNIT) tablet Take 1,000 Units by mouth daily.     COLLAGEN PO Take by mouth.     fexofenadine  (ALLEGRA ) 180 MG tablet Take 180 mg by mouth daily.     fluticasone   (FLONASE ) 50 MCG/ACT nasal spray Place 1 spray into both nostrils daily. (Patient taking differently: Place 1 spray into both nostrils daily as needed for allergies.) 16 g 2   magnesium gluconate (MAGONATE) 500 MG tablet Take 500 mg by mouth daily.     Multiple Vitamin (MULTIVITAMIN) tablet Take 1 tablet by mouth daily.     OVER THE COUNTER MEDICATION Take 1 tablet by mouth in the morning and at bedtime. GOLO Tablets     tirzepatide  7.5 MG/0.5ML injection vial Inject 7.5 mg into the skin once a week. 2 mL 5   glucose blood (ONETOUCH ULTRA) test strip USE TO CHECK BLOOD SUGAR ONCE DAILY 100 strip 12   Lancets (ONETOUCH DELICA PLUS LANCET30G) MISC USE TO  CHECK BLOOD SUGAR ONCE DAILY 100 each 12   No facility-administered medications prior to visit.    Allergies  Allergen Reactions   Penicillins Nausea Only and Rash    Has patient had a PCN reaction causing immediate rash, facial/tongue/throat swelling, SOB or lightheadedness with hypotension: yES Has patient had a PCN reaction causing severe rash involving mucus membranes or skin necrosis: No Has patient had a PCN reaction that required hospitalization: No Has patient had a PCN reaction occurring within the last 10 years: No If all of the above answers are NO, then may proceed with Cephalosporin use. CC    Review of Systems As per HPI  PE:    08/16/2024    9:30 AM 05/14/2024    9:06 AM 02/15/2024   10:02 AM  Vitals with BMI  Height 5' 6.75  5' 6.75  Weight 338 lbs 10 oz 346 lbs 13 oz 341 lbs  BMI 53.46  53.84  Systolic 97 107 90  Diastolic 68 57 60  Pulse 58 66 61   Physical Exam  Gen: Alert, well appearing.  Patient is oriented to person, place, time, and situation. AFFECT: pleasant, lucid thought and speech. Left shoulder without significant tenderness to palpation.  She has pain with abduction and can make it only to about 110 degrees.  Hawkins positive, Neer's and speeds equivocal.  Negative drop sign.  Negative empty  can sign. Yergason's negative.  O'Brien's positive.  Arm strength 5 out of 5 proximally and distally bilaterally.  LABS:  Last CBC Lab Results  Component Value Date   WBC 9.5 02/15/2024   HGB 14.8 02/15/2024   HCT 45.7 02/15/2024   MCV 86.4 02/15/2024   MCH 27.4 09/11/2023   RDW 16.1 (H) 02/15/2024   PLT 294.0 02/15/2024   Last metabolic panel Lab Results  Component Value Date   GLUCOSE 105 (H) 02/15/2024   NA 142 02/15/2024   K 4.8 02/15/2024   CL 104 02/15/2024   CO2 30 02/15/2024   BUN 15 02/15/2024   CREATININE 0.89 02/15/2024   GFR 66.80 02/15/2024   CALCIUM 10.3 02/15/2024   PHOS 2.8 10/26/2018   PROT 7.4 02/15/2024   ALBUMIN 4.3 02/15/2024   BILITOT 0.6 02/15/2024   ALKPHOS 84 02/15/2024   AST 16 02/15/2024   ALT 14 02/15/2024   ANIONGAP 7 10/30/2018   Last lipids Lab Results  Component Value Date   CHOL 199 02/15/2024   HDL 83.40 02/15/2024   LDLCALC 89 02/15/2024   TRIG 135.0 02/15/2024   CHOLHDL 2 02/15/2024   Last hemoglobin A1c Lab Results  Component Value Date   HGBA1C 5.8 (A) 08/16/2024   HGBA1C 5.8 08/16/2024   HGBA1C 5.8 08/16/2024   HGBA1C 5.8 08/16/2024   Last thyroid  functions Lab Results  Component Value Date   TSH 1.31 02/15/2024   Last vitamin B12 and Folate Lab Results  Component Value Date   VITAMINB12 283 08/04/2023   IMPRESSION AND PLAN:  #1 weight management: Obesity, BMI is 54. Comorbidities: Prediabetes, knee arthritis and impaired ambulation and ability to exercise, obstructive sleep apnea. POC Hba1c today is 5.8%, stable. She finds Zepbound  much better for her than Ozempic . Weight is down 8 pounds since last visit 3 months ago.  #2 left shoulder pain x 4 months. She has all positive impingement signs. Discussed physical therapy but she declined formal referral.  We did go over some home exercises she can do. She can take Aleve 2 tabs every 8 hours as needed.  She will call if she changes her mind and wants to get  referred to physical therapy. We discussed the possibility of a steroid injection at some point and she declined for now.  Will check bilateral shoulder radiographs--> ordered today.  An After Visit Summary was printed and given to the patient.  FOLLOW UP: Return in about 3 months (around 11/16/2024) for routine chronic illness f/u. Next CPE February 2026 Signed:  Gerlene Hockey, MD           08/16/2024

## 2024-08-19 ENCOUNTER — Ambulatory Visit (HOSPITAL_BASED_OUTPATIENT_CLINIC_OR_DEPARTMENT_OTHER)
Admission: RE | Admit: 2024-08-19 | Discharge: 2024-08-19 | Disposition: A | Discharge status: Home / Self Care | Source: Ambulatory Visit | Attending: Family Medicine | Admitting: Family Medicine

## 2024-08-19 DIAGNOSIS — M25411 Effusion, right shoulder: Secondary | ICD-10-CM | POA: Diagnosis not present

## 2024-08-19 DIAGNOSIS — M778 Other enthesopathies, not elsewhere classified: Secondary | ICD-10-CM | POA: Diagnosis not present

## 2024-08-19 DIAGNOSIS — M7542 Impingement syndrome of left shoulder: Secondary | ICD-10-CM | POA: Diagnosis not present

## 2024-08-19 DIAGNOSIS — M7541 Impingement syndrome of right shoulder: Secondary | ICD-10-CM | POA: Diagnosis not present

## 2024-08-19 DIAGNOSIS — M25511 Pain in right shoulder: Secondary | ICD-10-CM | POA: Insufficient documentation

## 2024-08-19 DIAGNOSIS — M19011 Primary osteoarthritis, right shoulder: Secondary | ICD-10-CM | POA: Diagnosis not present

## 2024-08-19 DIAGNOSIS — M25512 Pain in left shoulder: Secondary | ICD-10-CM | POA: Insufficient documentation

## 2024-08-19 DIAGNOSIS — M19012 Primary osteoarthritis, left shoulder: Secondary | ICD-10-CM | POA: Diagnosis not present

## 2024-08-26 DIAGNOSIS — E119 Type 2 diabetes mellitus without complications: Secondary | ICD-10-CM | POA: Diagnosis not present

## 2024-08-26 LAB — GENECONNECT MOLECULAR SCREEN: Genetic Analysis Overall Interpretation: NEGATIVE

## 2024-08-30 ENCOUNTER — Encounter: Payer: Self-pay | Admitting: Family Medicine

## 2024-08-30 ENCOUNTER — Ambulatory Visit: Payer: Self-pay | Admitting: Family Medicine

## 2024-08-30 DIAGNOSIS — G8929 Other chronic pain: Secondary | ICD-10-CM

## 2024-08-30 DIAGNOSIS — M19011 Primary osteoarthritis, right shoulder: Secondary | ICD-10-CM

## 2024-08-30 DIAGNOSIS — M2400 Loose body in unspecified joint: Secondary | ICD-10-CM

## 2024-09-09 ENCOUNTER — Other Ambulatory Visit: Payer: Self-pay | Admitting: Family Medicine

## 2024-09-09 MED ORDER — TIRZEPATIDE-WEIGHT MANAGEMENT 10 MG/0.5ML ~~LOC~~ SOLN: 10.0000 mg | SUBCUTANEOUS | 2 refills | Status: DC

## 2024-09-09 NOTE — Telephone Encounter (Signed)
 Zepbound  10 mg prescription sent

## 2024-09-12 ENCOUNTER — Encounter (HOSPITAL_BASED_OUTPATIENT_CLINIC_OR_DEPARTMENT_OTHER): Payer: Self-pay | Admitting: Pulmonary Disease

## 2024-09-12 ENCOUNTER — Ambulatory Visit (HOSPITAL_BASED_OUTPATIENT_CLINIC_OR_DEPARTMENT_OTHER): Admitting: Pulmonary Disease

## 2024-09-12 ENCOUNTER — Other Ambulatory Visit: Payer: Self-pay

## 2024-09-12 VITALS — BP 107/68 | HR 81 | Ht 66.75 in | Wt 337.9 lb

## 2024-09-12 DIAGNOSIS — Z23 Encounter for immunization: Secondary | ICD-10-CM | POA: Diagnosis not present

## 2024-09-12 DIAGNOSIS — J439 Emphysema, unspecified: Secondary | ICD-10-CM | POA: Diagnosis not present

## 2024-09-12 DIAGNOSIS — G4733 Obstructive sleep apnea (adult) (pediatric): Secondary | ICD-10-CM

## 2024-09-12 DIAGNOSIS — J4489 Other specified chronic obstructive pulmonary disease: Secondary | ICD-10-CM

## 2024-09-12 DIAGNOSIS — R911 Solitary pulmonary nodule: Secondary | ICD-10-CM

## 2024-09-12 MED ORDER — TIRZEPATIDE-WEIGHT MANAGEMENT 10 MG/0.5ML ~~LOC~~ SOAJ: 10.0000 mg | SUBCUTANEOUS | 0 refills | Status: DC

## 2024-09-12 NOTE — Progress Notes (Signed)
 Subjective:    Patient ID: Taylor Leon, female    DOB: 1956/01/23, 68 y.o.   MRN: [id number]   68 yo morbidly obese woman for FU of OSA & chronic cough -7mm RML nodule, stable since 2019    She has a chronic cough and intermittent wheezing since she moved from [place]  to [place]  in 2012.  ENT evaluation was negative.  Dr. 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  She uses Qvar  during spring and fall.   PMH: 09/2018 VATS thoracotomy-parapneumonic effusion  Quit smoking 2011, 25 Pyrs  Significant tests/ events reviewed 11/2018 NPSG -  Wt 328 lbs  -mod OSA -RDI 21/h, AHI 9/h 04/2021 HST AHI 14/h   PFTs 01/2019 nml   09/2018 CT angiogram chest left lower lobe pneumonia.  06/2021 LDCT chest-RADS 3, 7 mm nodule right middle lobe   01/2022 CT chest without contrast stable nodule, left thyroid  nodule   CT chest without con 03/2023 9 mm thyroid  nodule, stable lung nodule, enlarged pulmonary trunk. 05/2024 LDCT chest >> stable nodule   Echo 05/2023, enlarged RV, RVSP 32, normal LVEF RHC nml pressures PA mean 21    Discussed the use of AI scribe software for clinical note transcription with the patient, who gave verbal consent to proceed.  History of Present Illness    Discussed the use of AI scribe software for clinical note transcription with the patient, who gave verbal consent to proceed.  History of Present Illness   Taylor Leon is a 68 year old female with emphysema and obstructive sleep apnea who presents for a follow-up visit.  Her breathing is stable, and she rarely uses her inhaler, even with seasonal changes. She takes Allegra  daily and uses Flonase  for severe symptoms. A recent scan in June showed a stable benign nodule with no changes in size.  She uses a CPAP machine for obstructive sleep apnea with good compliance, averaging six to six and a half hours per night. Without CPAP, she experiences wakefulness. The CPAP  settings are on auto, adjusting to a pressure of around twelve, which is comfortable for her. Her baseline sleep study showed high events, but current usage has reduced events to 0.3.         Review of Systems  neg for any significant sore throat, dysphagia, itching, sneezing, nasal congestion or excess/ purulent secretions, fever, chills, sweats, unintended wt loss, pleuritic or exertional cp, hempoptysis, orthopnea pnd or change in chronic leg swelling. Also denies presyncope, palpitations, heartburn, abdominal pain, nausea, vomiting, diarrhea or change in bowel or urinary habits, dysuria,hematuria, rash, arthralgias, visual complaints, headache, numbness weakness or ataxia.      Objective:   Physical Exam  Gen. Pleasant, obese, in no distress ENT - no lesions, no post nasal drip Neck: No JVD, no thyromegaly, no carotid bruits Lungs: no use of accessory muscles, no dullness to percussion, decreased without rales or rhonchi  Cardiovascular: Rhythm regular, heart sounds  normal, no murmurs or gallops, no peripheral edema Musculoskeletal: No deformities, no cyanosis or clubbing , no tremors        Assessment & Plan:   Assessment and Plan Assessment & Plan   Assessment and Plan    Emphysema Emphysema is well-controlled. She uses Ventolin  as needed with no significant exacerbations reported. Currently taking Allegra  daily and Flonase  as needed when symptoms intensify. - Continue Ventolin  as needed. - Continue Allegra  daily. - Use Flonase  as needed when symptoms intensify. -  Administer flu shot today.  Obstructive sleep apnea (OSA) on CPAP therapy OSA is well-controlled with CPAP therapy. She is compliant with more than 6.5 hours of use per night. CPAP settings are on auto, ranging from 5 to 15 cm H2O, with an average pressure of 12 cm H2O and a maximum of 12.7 cm H2O. Minimal leak reported, and daytime somnolence and fatigue have improved. Baseline sleep study showed high number  of events, now reduced to 0.3 events per hour, indicating effective control. - Continue current CPAP settings and usage.  Benign pulmonary nodule A benign pulmonary nodule has been stable with no change in size. It is not a cause for concern at this time. - Continue annual screening scans to monitor the nodule.

## 2024-09-12 NOTE — Addendum Note (Signed)
 Addended by: TRUDY WARREN CROME on: 09/12/2024 10:42 AM   Modules accepted: Orders

## 2024-09-12 NOTE — Patient Instructions (Signed)
  VISIT SUMMARY: Today, you had a follow-up visit to check on your emphysema, obstructive sleep apnea, and a benign pulmonary nodule. Your breathing is stable, and you are managing your symptoms well with your current medications and CPAP therapy. The recent scan showed no changes in the benign nodule.  YOUR PLAN: -EMPHYSEMA: Emphysema is a lung condition that causes shortness of breath. Your condition is well-controlled, and you should continue using Ventolin  as needed, take Allegra  daily, and use Flonase  when your symptoms get worse. You also received a flu shot today.  -OBSTRUCTIVE SLEEP APNEA (OSA) ON CPAP THERAPY: Obstructive sleep apnea is a condition where your breathing stops and starts during sleep. Your condition is well-controlled with your CPAP machine, which you are using for more than 6.5 hours each night. Continue with your current CPAP settings and usage.  -BENIGN PULMONARY NODULE: A benign pulmonary nodule is a small, non-cancerous growth in the lung. Your nodule has not changed in size and is not a cause for concern. Continue with annual screening scans to monitor it.  INSTRUCTIONS: Please continue with your current medications and CPAP therapy as discussed. Make sure to get your annual screening scans to monitor the benign pulmonary nodule. If you experience any changes in your symptoms or have any concerns, schedule a follow-up appointment.                      Contains text generated by Abridge.                                 Contains text generated by Abridge.

## 2024-09-18 ENCOUNTER — Ambulatory Visit (INDEPENDENT_AMBULATORY_CARE_PROVIDER_SITE_OTHER): Admitting: *Deleted

## 2024-09-18 VITALS — Ht 67.0 in | Wt 335.0 lb

## 2024-09-18 DIAGNOSIS — Z0000 Encounter for general adult medical examination without abnormal findings: Secondary | ICD-10-CM

## 2024-09-18 NOTE — Patient Instructions (Signed)
 Taylor Leon , Thank you for taking time to come for your Medicare Wellness Visit. I appreciate your ongoing commitment to your health goals. Please review the following plan we discussed and let me know if I can assist you in the future.   Screening recommendations/referrals: Colonoscopy: up to date Mammogram: up to date Bone Density: up to date Recommended yearly ophthalmology/optometry visit for glaucoma screening and checkup Recommended yearly dental visit for hygiene and checkup  Vaccinations: Influenza vaccine: up to date Pneumococcal vaccine: up to date Tdap vaccine: up to date Shingles vaccine: up to date       Preventive Care 65 Years and Older, Female Preventive care refers to lifestyle choices and visits with your health care provider that can promote health and wellness. What does preventive care include? A yearly physical exam. This is also called an annual well check. Dental exams once or twice a year. Routine eye exams. Ask your health care provider how often you should have your eyes checked. Personal lifestyle choices, including: Daily care of your teeth and gums. Regular physical activity. Eating a healthy diet. Avoiding tobacco and drug use. Limiting alcohol use. Practicing safe sex. Taking low-dose aspirin every day. Taking vitamin and mineral supplements as recommended by your health care provider. What happens during an annual well check? The services and screenings done by your health care provider during your annual well check will depend on your age, overall health, lifestyle risk factors, and family history of disease. Counseling  Your health care provider may ask you questions about your: Alcohol use. Tobacco use. Drug use. Emotional well-being. Home and relationship well-being. Sexual activity. Eating habits. History of falls. Memory and ability to understand (cognition). Work and work Astronomer. Reproductive health. Screening  You may have  the following tests or measurements: Height, weight, and BMI. Blood pressure. Lipid and cholesterol levels. These may be checked every 5 years, or more frequently if you are over 49 years old. Skin check. Lung cancer screening. You may have this screening every year starting at age 64 if you have a 30-pack-year history of smoking and currently smoke or have quit within the past 15 years. Fecal occult blood test (FOBT) of the stool. You may have this test every year starting at age 91. Flexible sigmoidoscopy or colonoscopy. You may have a sigmoidoscopy every 5 years or a colonoscopy every 10 years starting at age 67. Hepatitis C blood test. Hepatitis B blood test. Sexually transmitted disease (STD) testing. Diabetes screening. This is done by checking your blood sugar (glucose) after you have not eaten for a while (fasting). You may have this done every 1-3 years. Bone density scan. This is done to screen for osteoporosis. You may have this done starting at age 30. Mammogram. This may be done every 1-2 years. Talk to your health care provider about how often you should have regular mammograms. Talk with your health care provider about your test results, treatment options, and if necessary, the need for more tests. Vaccines  Your health care provider may recommend certain vaccines, such as: Influenza vaccine. This is recommended every year. Tetanus, diphtheria, and acellular pertussis (Tdap, Td) vaccine. You may need a Td booster every 10 years. Zoster vaccine. You may need this after age 49. Pneumococcal 13-valent conjugate (PCV13) vaccine. One dose is recommended after age 62. Pneumococcal polysaccharide (PPSV23) vaccine. One dose is recommended after age 54. Talk to your health care provider about which screenings and vaccines you need and how often you need them.  This information is not intended to replace advice given to you by your health care provider. Make sure you discuss any questions  you have with your health care provider. Document Released: 01/08/2016 Document Revised: 08/31/2016 Document Reviewed: 10/13/2015 Elsevier Interactive Patient Education  2017 [employer].  Fall Prevention in the Home Falls can cause injuries. They can happen to people of all ages. There are many things you can do to make your home safe and to help prevent falls. What can I do on the outside of my home? Regularly fix the edges of walkways and driveways and fix any cracks. Remove anything that might make you trip as you walk through a door, such as a raised step or threshold. Trim any bushes or trees on the path to your home. Use bright outdoor lighting. Clear any walking paths of anything that might make someone trip, such as rocks or tools. Regularly check to see if handrails are loose or broken. Make sure that both sides of any steps have handrails. Any raised decks and porches should have guardrails on the edges. Have any leaves, snow, or ice cleared regularly. Use sand or salt on walking paths during winter. Clean up any spills in your garage right away. This includes oil or grease spills. What can I do in the bathroom? Use night lights. Install grab bars by the toilet and in the tub and shower. Do not use towel bars as grab bars. Use non-skid mats or decals in the tub or shower. If you need to sit down in the shower, use a plastic, non-slip stool. Keep the floor dry. Clean up any water that spills on the floor as soon as it happens. Remove soap buildup in the tub or shower regularly. Attach bath mats securely with double-sided non-slip rug tape. Do not have throw rugs and other things on the floor that can make you trip. What can I do in the bedroom? Use night lights. Make sure that you have a light by your bed that is easy to reach. Do not use any sheets or blankets that are too big for your bed. They should not hang down onto the floor. Have a firm chair that has side arms. You  can use this for support while you get dressed. Do not have throw rugs and other things on the floor that can make you trip. What can I do in the kitchen? Clean up any spills right away. Avoid walking on wet floors. Keep items that you use a lot in easy-to-reach places. If you need to reach something above you, use a strong step stool that has a grab bar. Keep electrical cords out of the way. Do not use floor polish or wax that makes floors slippery. If you must use wax, use non-skid floor wax. Do not have throw rugs and other things on the floor that can make you trip. What can I do with my stairs? Do not leave any items on the stairs. Make sure that there are handrails on both sides of the stairs and use them. Fix handrails that are broken or loose. Make sure that handrails are as long as the stairways. Check any carpeting to make sure that it is firmly attached to the stairs. Fix any carpet that is loose or worn. Avoid having throw rugs at the top or bottom of the stairs. If you do have throw rugs, attach them to the floor with carpet tape. Make sure that you have a light switch at the  top of the stairs and the bottom of the stairs. If you do not have them, ask someone to add them for you. What else can I do to help prevent falls? Wear shoes that: Do not have high heels. Have rubber bottoms. Are comfortable and fit you well. Are closed at the toe. Do not wear sandals. If you use a stepladder: Make sure that it is fully opened. Do not climb a closed stepladder. Make sure that both sides of the stepladder are locked into place. Ask someone to hold it for you, if possible. Clearly mark and make sure that you can see: Any grab bars or handrails. First and last steps. Where the edge of each step is. Use tools that help you move around (mobility aids) if they are needed. These include: Canes. Walkers. Scooters. Crutches. Turn on the lights when you go into a dark area. Replace any  light bulbs as soon as they burn out. Set up your furniture so you have a clear path. Avoid moving your furniture around. If any of your floors are uneven, fix them. If there are any pets around you, be aware of where they are. Review your medicines with your doctor. Some medicines can make you feel dizzy. This can increase your chance of falling. Ask your doctor what other things that you can do to help prevent falls. This information is not intended to replace advice given to you by your health care provider. Make sure you discuss any questions you have with your health care provider. Document Released: 10/08/2009 Document Revised: 05/19/2016 Document Reviewed: 01/16/2015 Elsevier Interactive Patient Education  2017 [employer].

## 2024-09-18 NOTE — Progress Notes (Signed)
 Subjective:   Taylor Leon is a 68 y.o. female who presents for Medicare Annual (Subsequent) preventive examination.  Visit Complete: Virtual I connected with  Luke Sharps on 09/18/24 by a audio enabled telemedicine application and verified that I am speaking with the correct person using two identifiers.  Patient Location: Home  Provider Location: Home Office  I discussed the limitations of evaluation and management by telemedicine. The patient expressed understanding and agreed to proceed.  Vital Signs: Because this visit was a virtual/telehealth visit, some criteria may be missing or patient reported. Any vitals not documented were not able to be obtained and vitals that have been documented are patient reported.  Cardiac Risk Factors include: advanced age (>67men, >38 women);obesity (BMI >30kg/m2);family history of premature cardiovascular disease     Objective:    Today's Vitals   09/18/24 1514  Weight: (!) 335 lb (152 kg)  Height: 5' 7 (1.702 m)  PainSc: 8    Body mass index is 52.47 kg/m.     09/18/2024    3:16 PM 09/22/2023    6:17 AM 06/28/2023    1:06 PM 06/15/2022   12:56 PM 06/15/2021   10:36 AM 06/10/2021    1:31 PM 10/19/2018    7:50 AM  Advanced Directives  Does Patient Have a Medical Advance Directive? Yes Yes No Yes No No No   Type of Estate agent of [employer] Power of [place];Living will       Does patient want to make changes to medical advance directive?  No - Guardian declined  Yes (MAU/Ambulatory/Procedural Areas - Information given)     Copy of Healthcare Power of Attorney in Chart? No - copy requested No - copy requested       Would patient like information on creating a medical advance directive?   No - Patient declined  No - Patient declined Yes (MAU/Ambulatory/Procedural Areas - Information given) No - Patient declined      Data saved with a previous flowsheet row definition    Current Medications  (verified) Outpatient Encounter Medications as of 09/18/2024  Medication Sig   albuterol  (VENTOLIN  HFA) 108 (90 Base) MCG/ACT inhaler Inhale 2 puffs into the lungs every 6 (six) hours as needed for wheezing or shortness of breath.   Ascorbic Acid (VITAMIN C) 1000 MG tablet Take 1,000 mg by mouth daily.   beclomethasone (QVAR  REDIHALER) 40 MCG/ACT inhaler INHALE 2 PUFFS INTO THE LUNGS TWICE A DAY   blood glucose meter kit and supplies KIT Use to check glucose 1-2 times daily   cholecalciferol (VITAMIN D) 25 MCG (1000 UNIT) tablet Take 1,000 Units by mouth daily.   COLLAGEN PO Take by mouth.   fexofenadine  (ALLEGRA ) 180 MG tablet Take 180 mg by mouth daily.   fluticasone  (FLONASE ) 50 MCG/ACT nasal spray Place 1 spray into both nostrils daily.   glucose blood (ONETOUCH ULTRA) test strip USE TO CHECK BLOOD SUGAR ONCE DAILY   Lancets (ONETOUCH DELICA PLUS LANCET30G) MISC USE TO CHECK BLOOD SUGAR ONCE DAILY   magnesium gluconate (MAGONATE) 500 MG tablet Take 500 mg by mouth daily.   Multiple Vitamin (MULTIVITAMIN) tablet Take 1 tablet by mouth daily.   OVER THE COUNTER MEDICATION Take 1 tablet by mouth in the morning and at bedtime. GOLO Tablets   tirzepatide  (ZEPBOUND ) 10 MG/0.5ML Pen Inject 10 mg into the skin once a week.   No facility-administered encounter medications on file as of 09/18/2024.    Allergies (verified) Penicillins   History:  Past Medical History:  Diagnosis Date   BPPV (benign paroxysmal positional vertigo) 01/30/2014   CAP (community acquired pneumonia) 10/2018   with acute hypoxic RF, empyema-->VATS   Cyst, breast 01/2012; 06/2012; 02/26/13; 03/03/14   Complicated cysts in upper outer left breast--no evidence of malignancy--f/u bilat diag mammo/left breast u/s on 03/06/15 showed resolution of left breast cysts.  Repeat screening mammogram 1 yr.   Empyema (HCC)    drained by CT surgery/VATS.  Full recovery as of 12/2018 CT surgery f/u visit.   H/O allergic rhinitis    Dr.  Vinie   Hepatic cyst 2019   Noted on CT chest imaging.  Multiple, small, simple   History of pneumonia 2009 and 2010   supplemential oxygen d/c'd 12/16/18 by pulm   Mild persistent asthma, well controlled 2014   New adult onset asthma after respiratory infection (Dr. Vinie)   Morbid obesity (HCC)    BMI 50+.  At one point in time she was going to Bariatric clinic on [address] and got weekly HCG injections, monthly vit B12 injections, and took phentermine daily.  As of 05/2015 she was no longer doing this.   Nephrolithiasis 04/2018 first episode   Dr. Devere ([facility]): 1.15mm right UVJ stone + 6 mm L AML.  Pt elected for trial of passage.   OSA (obstructive sleep apnea) 10/2018   Mild/mod OSA 12/2018--pt not able to afford CPAP as of 01/2019.   Osteoarthritis of both knees    No improvement with steroid injections; did synvisc x 3 yrs; bilat replacement was recommended but she declined.   Osteoarthritis of both shoulders    07/2024 xrays   Prediabetes    she's on metformin    Pulmonary nodule 06/29/2021   on lung ca screening CT->repeat 69mo.  This has been stable as of 03/2023   Recurrent low back pain    Thyroid  nodule 03/2022   1 on each side, needs rpt u/s 03/2023   Past Surgical History:  Procedure Laterality Date   APPENDECTOMY  1978   CARDIAC CATHETERIZATION  2010   Normal coronaries per pt.  08/2023 normal PA pressure and normal coronaries   CARDIOVASCULAR STRESS TEST  2010   abnl per pt; f/u cath clean   CESAREAN SECTION     x 3   CHOLECYSTECTOMY  1986   COLONOSCOPY  2011   lipoma and small diminutive polyp in rectum (in [place])-->[facility] to do repeat as of 04/05/21   COLONOSCOPY  06/15/2021   Normal-recall 10 yrs   COLONOSCOPY WITH PROPOFOL  N/A 06/15/2021   Procedure: COLONOSCOPY WITH PROPOFOL ;  Surgeon: Elicia Claw, MD;  Location: [facility];  Service: Gastroenterology;  Laterality: N/A;   DEXA  06/2021   06/2021 NORMAL.  01/2024 normal.   EMPYEMA DRAINAGE Left  10/24/2018   Evacuation of empyema with decortication.  Procedure: EMPYEMA DRAINAGE;  Surgeon: Army Dallas NOVAK, MD;  Location: [facility] OR;  Service: Thoracic;  Laterality: Left;   LUMBAR DISC SURGERY  1993   L5/S1   RIGHT/LEFT HEART CATH AND CORONARY ANGIOGRAPHY N/A 09/22/2023   Procedure: RIGHT/LEFT HEART CATH AND CORONARY ANGIOGRAPHY;  Surgeon: Burnard Debby LABOR, MD;  Location: [facility];  Service: Cardiovascular;  Laterality: N/A;   TONSILLECTOMY AND ADENOIDECTOMY  1967ish   TOTAL ABDOMINAL HYSTERECTOMY  1994   Ovaries are still in.  This was done for DUB/fibroids.   TRANSTHORACIC ECHOCARDIOGRAM     05/2023 NORMAL   VIDEO ASSISTED THORACOSCOPY (VATS)/THOROCOTOMY Left 10/24/2018   Procedure:  VIDEO ASSISTED THORACOSCOPY (VATS)/THOROCOTOMY with EVACUATION OF EMPYEMA AND DECORTICATION.;  Surgeon: Army Dallas NOVAK, MD;  Location: [facility] OR;  Service: Thoracic;  Laterality: Left;   VIDEO BRONCHOSCOPY N/A 10/24/2018   Procedure: VIDEO BRONCHOSCOPY;  Surgeon: Army Dallas NOVAK, MD;  Location: [facility] OR;  Service: Thoracic;  Laterality: N/A;   Family History  Problem Relation Age of Onset   Arthritis Mother    Heart disease Mother 45   Hypertension Mother    Diabetes Mother    Colon cancer Mother        75's   Arthritis Paternal Grandmother    Breast cancer Neg Hx    Social History   Socioeconomic History   Marital status: Married    Spouse name: Not on file   Number of children: 4   Years of education: Not on file   Highest education level: Bachelor's degree (e.g., BA, AB, BS)  Occupational History   Not on file  Tobacco Use   Smoking status: Former    Current packs/day: 0.00    Average packs/day: 0.7 packs/day for 34.0 years (25.5 ttl pk-yrs)    Types: Cigarettes    Start date: 08/1976    Quit date: 12/28/2009    Years since quitting: 14.7    Passive exposure: Past   Smokeless tobacco: Never  Vaping Use   Vaping status: Never Used  Substance and Sexual Activity   Alcohol  use: Yes    Comment: social   Drug use: No   Sexual activity: Not Currently  Other Topics Concern   Not on file  Social History Narrative   Married, 4 grown children.   Relocated to [facility] from [place]  about 2012.   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   Exercise: intermittently does water aerobics..            Social Drivers of Health   Financial Resource Strain: Low Risk  (09/18/2024)   Overall Financial Resource Strain (CARDIA)    Difficulty of Paying Living Expenses: Not hard at all  Food Insecurity: No Food Insecurity (09/18/2024)   Hunger Vital Sign    Worried About Running Out of Food in the Last Year: Never true    Ran Out of Food in the Last Year: Never true  Transportation Needs: No Transportation Needs (09/18/2024)   PRAPARE - Administrator, Civil Service (Medical): No    Lack of Transportation (Non-Medical): No  Physical Activity: Inactive (09/18/2024)   Exercise Vital Sign    Days of Exercise per Week: 0 days    Minutes of Exercise per Session: 0 min  Stress: No Stress Concern Present (09/18/2024)   [employer] of Occupational Health - Occupational Stress Questionnaire    Feeling of Stress: Not at all  Social Connections: Socially Integrated (09/18/2024)   Social Connection and Isolation Panel    Frequency of Communication with Friends and Family: More than three times a week    Frequency of Social Gatherings with Friends and Family: More than three times a week    Attends Religious Services: More than 4 times per year    Active Member of [employer] or Organizations: Yes    Attends Engineer, structural: More than 4 times per year    Marital Status: Married    Tobacco Counseling Counseling given: Not Answered   Clinical Intake:  Pre-visit preparation completed: Yes  Pain : 0-10 Pain Score: 8  Pain Location: Shoulder Pain Orientation: Right, Left Pain  Descriptors / Indicators: Burning, Aching, Dull Pain Onset: More than a month ago Pain Frequency: Constant     Diabetes: No  How often do you need to have someone help you when you read instructions, pamphlets, or other written materials from your doctor or pharmacy?: 1 - Never  Interpreter Needed?: No  Information entered by :: Mliss Graff LPN   Activities of Daily Living    09/18/2024    3:18 PM  In your present state of health, do you have any difficulty performing the following activities:  Hearing? 0  Vision? 0  Difficulty concentrating or making decisions? 0  Walking or climbing stairs? 0  Dressing or bathing? 0  Doing errands, shopping? 0  Preparing Food and eating ? N  Using the Toilet? N  In the past six months, have you accidently leaked urine? N  Do you have problems with loss of bowel control? N  Managing your Medications? N  Managing your Finances? N  Housekeeping or managing your Housekeeping? N    Patient Care Team: Candise Aleene DEL, MD as PCP - General (Family Medicine) Tobb, Kardie, DO as PCP - Cardiology (Cardiology) Hicks, Roselyn M, MD (Inactive) as Consulting Physician (Allergy) Devere Lonni Righter, MD as Consulting Physician (Urology) Jude Harden GAILS, MD as Consulting Physician (Pulmonary Disease) Elicia Claw, MD as Consulting Physician (Gastroenterology)  Indicate any recent Medical Services you may have received from other than [facility] providers in the past year (date may be approximate).     Assessment:   This is a routine wellness examination for [place].  Hearing/Vision screen Hearing Screening - Comments:: No trouble hearing Vision Screening - Comments:: Up to date [place]   Goals Addressed             This Visit's Progress    Patient Stated   Not on track    Lose weight      Patient Stated   Not on track    None at this time      Weight (lb) < 200 lb (90.7 kg)   335 lb (152 kg)      Depression Screen    08/16/2024     9:38 AM 02/15/2024   10:07 AM 11/13/2023    8:44 AM 10/16/2023    9:09 AM 09/14/2023    9:59 AM 08/04/2023    8:11 AM 06/28/2023    1:04 PM  PHQ 2/9 Scores  PHQ - 2 Score 0 0 0 0 0 0 0    Fall Risk    09/18/2024    3:15 PM 08/16/2024    9:38 AM 11/13/2023    8:44 AM 09/14/2023    9:59 AM 08/04/2023    8:11 AM  Fall Risk   Falls in the past year? 0 0 0 0 0  Number falls in past yr: 0 0  0   Injury with Fall? 0 0  0   Risk for fall due to :  No Fall Risks  No Fall Risks No Fall Risks  Follow up Falls evaluation completed;Education provided;Falls prevention discussed Falls evaluation completed Falls evaluation completed Falls evaluation completed Falls evaluation completed    MEDICARE RISK AT HOME: Medicare Risk at Home Any stairs in or around the home?: No If so, are there any without handrails?: No Home free of loose throw rugs in walkways, pet beds, electrical cords, etc?: Yes Adequate lighting in your home to reduce risk of falls?: Yes Life  alert?: No Use of a cane, walker or w/c?: No Grab bars in the bathroom?: No Shower chair or bench in shower?: No Elevated toilet seat or a handicapped toilet?: Yes  TIMED UP AND GO:  Was the test performed?  No    Cognitive Function:        09/18/2024    3:17 PM 06/28/2023    1:07 PM 06/15/2022    1:01 PM  6CIT Screen  What Year? 0 points 0 points 0 points  What month? 0 points 0 points 0 points  What time? 0 points 0 points 0 points  Count back from 20 0 points 0 points 0 points  Months in reverse 0 points 0 points 0 points  Repeat phrase 0 points 0 points 0 points  Total Score 0 points 0 points 0 points    Immunizations Immunization History  Administered Date(s) Administered   Fluad Quad(high Dose 65+) 09/14/2021, 10/07/2022   Fluad Trivalent(High Dose 65+) 10/16/2023   INFLUENZA, HIGH DOSE SEASONAL PF 09/12/2024   Influenza,inj,Quad PF,6+ Mos 08/29/2013, 09/10/2015, 12/06/2016, 11/02/2017, 10/10/2018, 08/23/2019,  12/21/2020   Moderna Covid-19 Vaccine Bivalent Booster 48yrs & up 11/09/2021, 08/15/2022   Moderna SARS-COV2 Booster Vaccination 05/25/2021   Moderna Sars-Covid-2 Vaccination 02/08/2020, 03/07/2020, 11/25/2020   PNEUMOCOCCAL CONJUGATE-20 06/10/2021   PPD Test 01/27/2017   Pfizer(Comirnaty)Fall Seasonal Vaccine 12 years and older 09/09/2023   Pneumococcal Polysaccharide-23 01/25/2019   Respiratory Syncytial Virus Vaccine,Recomb Aduvanted(Arexvy) 12/16/2022   Tdap 06/05/2015   Zoster Recombinant(Shingrix ) 06/20/2017, 08/30/2017    TDAP status: Up to date  Flu Vaccine status: Up to date  Pneumococcal vaccine status: Up to date  Covid-19 vaccine status: Information provided on how to obtain vaccines.   Qualifies for Shingles Vaccine? Yes   Zostavax completed Yes   Shingrix  Completed?: Yes  Screening Tests Health Maintenance  Topic Date Due   COVID-19 Vaccine (8 - 2024-25 season) 08/26/2024   Diabetic kidney evaluation - eGFR measurement  02/14/2025   Diabetic kidney evaluation - Urine ACR  02/14/2025   FOOT EXAM  02/14/2025   HEMOGLOBIN A1C  02/16/2025   Lung Cancer Screening  05/23/2025   DTaP/Tdap/Td (2 - Td or Tdap) 06/04/2025   OPHTHALMOLOGY EXAM  07/31/2025   Medicare Annual Wellness (AWV)  09/18/2025   Mammogram  02/14/2026   Colonoscopy  06/16/2031   Pneumococcal Vaccine: 50+ Years  Completed   Influenza Vaccine  Completed   DEXA SCAN  Completed   Hepatitis C Screening  Completed   Zoster Vaccines- Shingrix   Completed   HPV VACCINES  Aged Out   Meningococcal B Vaccine  Aged Out    Health Maintenance  Health Maintenance Due  Topic Date Due   COVID-19 Vaccine (8 - 2024-25 season) 08/26/2024    Colorectal cancer screening: No longer required.   Mammogram status: Completed  . Repeat every year  Bone Density status: Completed 2025. Results reflect: Bone density results: NORMAL. Repeat every 3 years.  Lung Cancer Screening: (Low Dose CT Chest recommended if  Age 24-80 years, 20 pack-year currently smoking OR have quit w/in 15years.    Lung Cancer Screening Referral: due 2026  Additional Screening:  Hepatitis C Screening: does not qualify; Completed 2018  Vision Screening: Recommended annual ophthalmology exams for early detection of glaucoma and other disorders of the eye. Is the patient up to date with their annual eye exam?  Yes  Who is the provider or what is the name of the office in which the patient attends annual eye exams?  [facility] If pt is not established with a provider, would they like to be referred to a provider to establish care? No .   Dental Screening: Recommended annual dental exams for proper oral hygiene    Community Resource Referral / Chronic Care Management: CRR required this visit?  No   CCM required this visit?  No     Plan:     I have personally reviewed and noted the following in the patient's chart:   Medical and social history Use of alcohol, tobacco or illicit drugs  Current medications and supplements including opioid prescriptions. Patient is not currently taking opioid prescriptions. Functional ability and status Nutritional status Physical activity Advanced directives List of other physicians Hospitalizations, surgeries, and ER visits in previous 12 months Vitals Screenings to include cognitive, depression, and falls Referrals and appointments  In addition, I have reviewed and discussed with patient certain preventive protocols, quality metrics, and best practice recommendations. A written personalized care plan for preventive services as well as general preventive health recommendations were provided to patient.     Mliss Graff, LPN   [id number]   After Visit Summary: (MyChart) Due to this being a telephonic visit, the after visit summary with patients personalized plan was offered to patient via MyChart   Nurse Notes:

## 2024-09-25 DIAGNOSIS — E119 Type 2 diabetes mellitus without complications: Secondary | ICD-10-CM | POA: Diagnosis not present

## 2024-10-07 MED ORDER — ZEPBOUND 12.5 MG/0.5ML ~~LOC~~ SOAJ: 12.5000 mg | SUBCUTANEOUS | 2 refills | Status: DC

## 2024-10-07 NOTE — Telephone Encounter (Signed)
Pt advised via my chart.

## 2024-10-07 NOTE — Telephone Encounter (Signed)
 Ok, 12.5mg  zepbound  rx sent

## 2024-10-10 DIAGNOSIS — G4733 Obstructive sleep apnea (adult) (pediatric): Secondary | ICD-10-CM | POA: Diagnosis not present

## 2024-10-26 DIAGNOSIS — E119 Type 2 diabetes mellitus without complications: Secondary | ICD-10-CM | POA: Diagnosis not present

## 2024-11-10 DIAGNOSIS — G4733 Obstructive sleep apnea (adult) (pediatric): Secondary | ICD-10-CM | POA: Diagnosis not present

## 2024-11-15 ENCOUNTER — Encounter: Payer: Self-pay | Admitting: Family Medicine

## 2024-11-15 ENCOUNTER — Ambulatory Visit (INDEPENDENT_AMBULATORY_CARE_PROVIDER_SITE_OTHER): Admitting: Family Medicine

## 2024-11-15 VITALS — BP 90/59 | HR 68 | Temp 97.7°F | Ht 67.0 in | Wt 327.4 lb

## 2024-11-15 DIAGNOSIS — R7303 Prediabetes: Secondary | ICD-10-CM | POA: Diagnosis not present

## 2024-11-15 DIAGNOSIS — E66813 Obesity, class 3: Secondary | ICD-10-CM | POA: Diagnosis not present

## 2024-11-15 DIAGNOSIS — Z6841 Body Mass Index (BMI) 40.0 and over, adult: Secondary | ICD-10-CM | POA: Diagnosis not present

## 2024-11-15 DIAGNOSIS — Z7689 Persons encountering health services in other specified circumstances: Secondary | ICD-10-CM

## 2024-11-15 LAB — POCT GLYCOSYLATED HEMOGLOBIN (HGB A1C)
HbA1c POC (<> result, manual entry): 5.7 % (ref 4.0–5.6)
HbA1c, POC (controlled diabetic range): 5.7 % (ref 0.0–7.0)
HbA1c, POC (prediabetic range): 5.7 % (ref 5.7–6.4)
Hemoglobin A1C: 5.7 % — AB (ref 4.0–5.6)

## 2024-11-15 MED ORDER — ZEPBOUND 12.5 MG/0.5ML ~~LOC~~ SOAJ: 12.5000 mg | SUBCUTANEOUS | 1 refills | Status: AC

## 2024-11-15 NOTE — Progress Notes (Signed)
 OFFICE VISIT  11/15/2024  CC:  Chief Complaint  Patient presents with   Medical Management of Chronic Issues    Patient is a 68 y.o. female who presents for 12-month follow-up weight management and prediabetes. A/P as of last visit: 1 weight management: Obesity, BMI is 54. Comorbidities: Prediabetes, knee arthritis and impaired ambulation and ability to exercise, obstructive sleep apnea. POC Hba1c today is 5.8%, stable. She finds Zepbound  much better for her than Ozempic . Weight is down 8 pounds since last visit 3 months ago.  INTERIM HX: Feeling well. Doing better with diet and exercise.  Tolerating tirzepatide  12.5 mg/week. Shoulders are not hurting much lately.  Past Medical History:  Diagnosis Date   BPPV (benign paroxysmal positional vertigo) 01/30/2014   CAP (community acquired pneumonia) 10/2018   with acute hypoxic RF, empyema-->VATS   Cyst, breast 01/2012; 06/2012; 02/26/13; 03/03/14   Complicated cysts in upper outer left breast--no evidence of malignancy--f/u bilat diag mammo/left breast u/s on 03/06/15 showed resolution of left breast cysts.  Repeat screening mammogram 1 yr.   Diabetes mellitus without complication (HCC)    Empyema (HCC)    drained by CT surgery/VATS.  Full recovery as of 12/2018 CT surgery f/u visit.   H/O allergic rhinitis    Dr. Vinie   Hepatic cyst 2019   Noted on CT chest imaging.  Multiple, small, simple   History of pneumonia 2009 and 2010   supplemential oxygen d/c'd 12/16/18 by pulm   Mild persistent asthma, well controlled 2014   New adult onset asthma after respiratory infection (Dr. Vinie)   Morbid obesity (HCC)    BMI 50+.  At one point in time she was going to Bariatric clinic on [address] and got weekly HCG injections, monthly vit B12 injections, and took phentermine daily.  As of 05/2015 she was no longer doing this.   Nephrolithiasis 04/2018 first episode   Dr. Devere ([facility]): 1.37mm right UVJ stone + 6 mm L AML.  Pt elected  for trial of passage.   OSA (obstructive sleep apnea) 10/2018   Mild/mod OSA 12/2018--pt not able to afford CPAP as of 01/2019.   Osteoarthritis of both knees    No improvement with steroid injections; did synvisc x 3 yrs; bilat replacement was recommended but she declined.   Osteoarthritis of both shoulders    07/2024 xrays   Prediabetes    she's on metformin    Pulmonary nodule 06/29/2021   on lung ca screening CT->repeat 33mo.  This has been stable as of 03/2023   Recurrent low back pain    Thyroid  nodule 03/2022   1 on each side, needs rpt u/s 03/2023    Past Surgical History:  Procedure Laterality Date   APPENDECTOMY  1978   CARDIAC CATHETERIZATION  2010   Normal coronaries per pt.  08/2023 normal PA pressure and normal coronaries   CARDIOVASCULAR STRESS TEST  2010   abnl per pt; f/u cath clean   CESAREAN SECTION     x 3   CHOLECYSTECTOMY  1986   COLONOSCOPY  2011   lipoma and small diminutive polyp in rectum (in [place])-->[facility] to do repeat as of 04/05/21   COLONOSCOPY  06/15/2021   Normal-recall 10 yrs   COLONOSCOPY WITH PROPOFOL  N/A 06/15/2021   Procedure: COLONOSCOPY WITH PROPOFOL ;  Surgeon: Elicia Claw, MD;  Location: [facility];  Service: Gastroenterology;  Laterality: N/A;   DEXA  06/2021   06/2021 NORMAL.  01/2024 normal.   EMPYEMA DRAINAGE Left 10/24/2018  Evacuation of empyema with decortication.  Procedure: EMPYEMA DRAINAGE;  Surgeon: Army Dallas NOVAK, MD;  Location: [facility];  Service: Thoracic;  Laterality: Left;   LUMBAR DISC SURGERY  1993   L5/S1   RIGHT/LEFT HEART CATH AND CORONARY ANGIOGRAPHY N/A 09/22/2023   Procedure: RIGHT/LEFT HEART CATH AND CORONARY ANGIOGRAPHY;  Surgeon: Burnard Debby LABOR, MD;  Location: [facility];  Service: Cardiovascular;  Laterality: N/A;   TONSILLECTOMY AND ADENOIDECTOMY  1967ish   TOTAL ABDOMINAL HYSTERECTOMY  1994   Ovaries are still in.  This was done for DUB/fibroids.   TRANSTHORACIC ECHOCARDIOGRAM     05/2023 NORMAL    VIDEO ASSISTED THORACOSCOPY (VATS)/THOROCOTOMY Left 10/24/2018   Procedure: VIDEO ASSISTED THORACOSCOPY (VATS)/THOROCOTOMY with EVACUATION OF EMPYEMA AND DECORTICATION.;  Surgeon: Army Dallas NOVAK, MD;  Location: [facility];  Service: Thoracic;  Laterality: Left;   VIDEO BRONCHOSCOPY N/A 10/24/2018   Procedure: VIDEO BRONCHOSCOPY;  Surgeon: Army Dallas NOVAK, MD;  Location: [facility];  Service: Thoracic;  Laterality: N/A;    Outpatient Medications Prior to Visit  Medication Sig Dispense Refill   albuterol  (VENTOLIN  HFA) 108 (90 Base) MCG/ACT inhaler Inhale 2 puffs into the lungs every 6 (six) hours as needed for wheezing or shortness of breath. 8 g 3   Ascorbic Acid (VITAMIN C) 1000 MG tablet Take 1,000 mg by mouth daily.     beclomethasone (QVAR  REDIHALER) 40 MCG/ACT inhaler INHALE 2 PUFFS INTO THE LUNGS TWICE A DAY 10.6 g 3   blood glucose meter kit and supplies KIT Use to check glucose 1-2 times daily 1 each 0   cholecalciferol (VITAMIN D) 25 MCG (1000 UNIT) tablet Take 1,000 Units by mouth daily.     COLLAGEN PO Take by mouth.     fexofenadine  (ALLEGRA ) 180 MG tablet Take 180 mg by mouth daily.     fluticasone  (FLONASE ) 50 MCG/ACT nasal spray Place 1 spray into both nostrils daily. 16 g 2   glucose blood (ONETOUCH ULTRA) test strip USE TO CHECK BLOOD SUGAR ONCE DAILY 100 strip 12   Lancets (ONETOUCH DELICA PLUS LANCET30G) MISC USE TO CHECK BLOOD SUGAR ONCE DAILY 100 each 12   magnesium gluconate (MAGONATE) 500 MG tablet Take 500 mg by mouth daily.     Multiple Vitamin (MULTIVITAMIN) tablet Take 1 tablet by mouth daily.     OVER THE COUNTER MEDICATION Take 1 tablet by mouth in the morning and at bedtime. GOLO Tablets     tirzepatide  (ZEPBOUND ) 12.5 MG/0.5ML Pen Inject 12.5 mg into the skin once a week. 2 mL 2   No facility-administered medications prior to visit.    Allergies  Allergen Reactions   Penicillins Nausea Only and Rash    Has patient had a PCN reaction causing immediate rash,  facial/tongue/throat swelling, SOB or lightheadedness with hypotension: yES Has patient had a PCN reaction causing severe rash involving mucus membranes or skin necrosis: No Has patient had a PCN reaction that required hospitalization: No Has patient had a PCN reaction occurring within the last 10 years: No If all of the above answers are NO, then may proceed with Cephalosporin use. CC    Review of Systems As per HPI  PE:    11/15/2024    9:49 AM 09/18/2024    3:14 PM 09/12/2024    9:44 AM  Vitals with BMI  Height 5' 7 5' 7 5' 6.75  Weight 327 lbs 6 oz 335 lbs 337 lbs 14 oz  BMI 51.27 52.46 53.35  Systolic 90  107  Diastolic 59  68  Pulse 68  81     Physical Exam  Gen: Alert, well appearing.  Patient is oriented to person, place, time, and situation. AFFECT: pleasant, lucid thought and speech. No further exam today  LABS:  Last CBC Lab Results  Component Value Date   WBC 9.5 02/15/2024   HGB 14.8 02/15/2024   HCT 45.7 02/15/2024   MCV 86.4 02/15/2024   MCH 27.4 09/11/2023   RDW 16.1 (H) 02/15/2024   PLT 294.0 02/15/2024   Last metabolic panel Lab Results  Component Value Date   GLUCOSE 105 (H) 02/15/2024   NA 142 02/15/2024   K 4.8 02/15/2024   CL 104 02/15/2024   CO2 30 02/15/2024   BUN 15 02/15/2024   CREATININE 0.89 02/15/2024   GFR 66.80 02/15/2024   CALCIUM 10.3 02/15/2024   PHOS 2.8 10/26/2018   PROT 7.4 02/15/2024   ALBUMIN 4.3 02/15/2024   BILITOT 0.6 02/15/2024   ALKPHOS 84 02/15/2024   AST 16 02/15/2024   ALT 14 02/15/2024   ANIONGAP 7 10/30/2018   Last lipids Lab Results  Component Value Date   CHOL 199 02/15/2024   HDL 83.40 02/15/2024   LDLCALC 89 02/15/2024   TRIG 135.0 02/15/2024   CHOLHDL 2 02/15/2024   Last hemoglobin A1c Lab Results  Component Value Date   HGBA1C 5.7 (A) 11/15/2024   HGBA1C 5.7 11/15/2024   HGBA1C 5.7 11/15/2024   HGBA1C 5.7 11/15/2024   Last thyroid  functions Lab Results  Component Value Date    TSH 1.31 02/15/2024   IMPRESSION AND PLAN:  #1 morbid obesity, weight management, doing well on tirzepatide /zepbound  12.5 mg weekly. She is starting to make some ground now. BMI has dropped from 53.3 to 51.7 over the last 2 months.  Continue current dose. #2 prediabetes. POC Hba1c today is 5.7%.  An After Visit Summary was printed and given to the patient.  FOLLOW UP: Return in about 3 months (around 02/15/2025) for annual CPE (fasting).  Signed:  Gerlene Hockey, MD           11/15/2024

## 2025-01-23 ENCOUNTER — Other Ambulatory Visit: Payer: Self-pay | Admitting: Family Medicine

## 2025-01-23 DIAGNOSIS — Z1231 Encounter for screening mammogram for malignant neoplasm of breast: Secondary | ICD-10-CM

## 2025-02-10 ENCOUNTER — Encounter: Admitting: Family Medicine

## 2025-02-17 ENCOUNTER — Ambulatory Visit

## 2025-09-24 ENCOUNTER — Ambulatory Visit
